# Patient Record
Sex: Male | Born: 1960 | Race: Black or African American | Hispanic: No | Marital: Single | Smoking: Current every day smoker
Health system: Southern US, Community
[De-identification: ages and names within clinical notes are randomized; demographics above are authoritative.]

## PROBLEM LIST (undated history)

## (undated) DIAGNOSIS — I509 Heart failure, unspecified: Secondary | ICD-10-CM

## (undated) DIAGNOSIS — I272 Pulmonary hypertension, unspecified: Secondary | ICD-10-CM

## (undated) DIAGNOSIS — J449 Chronic obstructive pulmonary disease, unspecified: Secondary | ICD-10-CM

## (undated) DIAGNOSIS — F319 Bipolar disorder, unspecified: Secondary | ICD-10-CM

## (undated) DIAGNOSIS — F1010 Alcohol abuse, uncomplicated: Secondary | ICD-10-CM

## (undated) DIAGNOSIS — I10 Essential (primary) hypertension: Secondary | ICD-10-CM

## (undated) DIAGNOSIS — I38 Endocarditis, valve unspecified: Secondary | ICD-10-CM

## (undated) DIAGNOSIS — M25559 Pain in unspecified hip: Secondary | ICD-10-CM

## (undated) HISTORY — DX: Pulmonary hypertension, unspecified: I27.20

## (undated) HISTORY — DX: Essential (primary) hypertension: I10

## (undated) HISTORY — DX: Heart failure, unspecified: I50.9

## (undated) HISTORY — PX: AORTIC VALVE REPLACEMENT: SHX41

## (undated) HISTORY — DX: Endocarditis, valve unspecified: I38

---

## 2005-10-11 ENCOUNTER — Emergency Department: Payer: Self-pay | Admitting: Emergency Medicine

## 2005-11-14 ENCOUNTER — Emergency Department: Payer: Self-pay | Admitting: Emergency Medicine

## 2006-08-10 ENCOUNTER — Emergency Department (HOSPITAL_COMMUNITY): Admission: EM | Admit: 2006-08-10 | Discharge: 2006-08-10 | Payer: Self-pay | Admitting: Emergency Medicine

## 2006-09-06 ENCOUNTER — Emergency Department: Payer: Self-pay | Admitting: Emergency Medicine

## 2007-06-14 ENCOUNTER — Emergency Department: Payer: Self-pay | Admitting: Emergency Medicine

## 2007-06-14 ENCOUNTER — Other Ambulatory Visit: Payer: Self-pay

## 2009-08-08 ENCOUNTER — Emergency Department: Payer: Self-pay | Admitting: Unknown Physician Specialty

## 2011-03-09 ENCOUNTER — Emergency Department: Payer: Self-pay | Admitting: Emergency Medicine

## 2011-08-07 ENCOUNTER — Observation Stay (HOSPITAL_COMMUNITY): Payer: Medicaid Other

## 2011-08-07 ENCOUNTER — Observation Stay (HOSPITAL_COMMUNITY)
Admission: EM | Admit: 2011-08-07 | Discharge: 2011-08-08 | Disposition: A | Discharge status: Rehabilitation Facility | Payer: Medicaid Other | Attending: Emergency Medicine | Admitting: Emergency Medicine

## 2011-08-07 ENCOUNTER — Emergency Department (HOSPITAL_COMMUNITY): Payer: Medicaid Other

## 2011-08-07 ENCOUNTER — Encounter (HOSPITAL_COMMUNITY): Payer: Self-pay | Admitting: *Deleted

## 2011-08-07 DIAGNOSIS — I38 Endocarditis, valve unspecified: Secondary | ICD-10-CM

## 2011-08-07 DIAGNOSIS — R079 Chest pain, unspecified: Principal | ICD-10-CM | POA: Insufficient documentation

## 2011-08-07 DIAGNOSIS — Z954 Presence of other heart-valve replacement: Secondary | ICD-10-CM | POA: Insufficient documentation

## 2011-08-07 DIAGNOSIS — I2510 Atherosclerotic heart disease of native coronary artery without angina pectoris: Secondary | ICD-10-CM | POA: Insufficient documentation

## 2011-08-07 DIAGNOSIS — F17200 Nicotine dependence, unspecified, uncomplicated: Secondary | ICD-10-CM | POA: Insufficient documentation

## 2011-08-07 DIAGNOSIS — J4489 Other specified chronic obstructive pulmonary disease: Secondary | ICD-10-CM | POA: Insufficient documentation

## 2011-08-07 DIAGNOSIS — F1410 Cocaine abuse, uncomplicated: Secondary | ICD-10-CM

## 2011-08-07 DIAGNOSIS — R0602 Shortness of breath: Secondary | ICD-10-CM | POA: Insufficient documentation

## 2011-08-07 DIAGNOSIS — J449 Chronic obstructive pulmonary disease, unspecified: Secondary | ICD-10-CM | POA: Insufficient documentation

## 2011-08-07 LAB — CARDIAC PANEL(CRET KIN+CKTOT+MB+TROPI)
CK, MB: 3.8 ng/mL (ref 0.3–4.0)
Relative Index: 0.7 (ref 0.0–2.5)
Troponin I: <0.3 ng/mL (ref <0.30)

## 2011-08-07 LAB — RAPID URINE DRUG SCREEN, HOSP PERFORMED: Opiates: NOT DETECTED

## 2011-08-07 LAB — PRO B NATRIURETIC PEPTIDE: Pro B Natriuretic peptide (BNP): 106.2 pg/mL (ref 0–125)

## 2011-08-07 LAB — CBC
MCH: 29.8 pg (ref 26.0–34.0)
MCHC: 34.5 g/dL (ref 30.0–36.0)
Platelets: 147 10*3/uL — ABNORMAL LOW (ref 150–400)
RBC: 5.43 MIL/uL (ref 4.22–5.81)
RDW: 14.2 % (ref 11.5–15.5)

## 2011-08-07 LAB — BASIC METABOLIC PANEL
CO2: 24 mEq/L (ref 19–32)
Calcium: 9.6 mg/dL (ref 8.4–10.5)
GFR calc Af Amer: >90 mL/min (ref >90)
GFR calc non Af Amer: >90 mL/min (ref >90)
Sodium: 133 mEq/L — ABNORMAL LOW (ref 135–145)

## 2011-08-07 LAB — POCT I-STAT TROPONIN I: Troponin i, poc: 0.01 ng/mL (ref 0.00–0.08)

## 2011-08-07 LAB — D-DIMER, QUANTITATIVE: D-Dimer, Quant: 2.79 ug/mL-FEU — ABNORMAL HIGH (ref 0.00–0.48)

## 2011-08-07 MED ORDER — SODIUM CHLORIDE 0.9 % IV SOLN
20.0000 mL | INTRAVENOUS | Status: DC
  Administered 2011-08-07: 20 mL via INTRAVENOUS

## 2011-08-07 MED ORDER — IOHEXOL 350 MG/ML SOLN
100.0000 mL | Freq: Once | INTRAVENOUS | Status: AC | PRN
  Administered 2011-08-07: 100 mL via INTRAVENOUS

## 2011-08-07 MED ORDER — LORAZEPAM 2 MG/ML IJ SOLN
1.0000 mg | Freq: Once | INTRAMUSCULAR | Status: AC
  Administered 2011-08-07: 1 mg via INTRAVENOUS
  Filled 2011-08-07: qty 1

## 2011-08-07 MED ORDER — NITROGLYCERIN 0.4 MG SL SUBL: 0.4000 mg | SUBLINGUAL_TABLET | SUBLINGUAL | Status: DC | PRN

## 2011-08-07 MED ORDER — ZOLPIDEM TARTRATE 5 MG PO TABS
5.0000 mg | ORAL_TABLET | Freq: Every evening | ORAL | Status: DC | PRN
  Administered 2011-08-07: 5 mg via ORAL
  Filled 2011-08-07: qty 1

## 2011-08-07 MED ORDER — ASPIRIN 325 MG PO TABS
325.0000 mg | ORAL_TABLET | ORAL | Status: AC
  Administered 2011-08-07: 325 mg via ORAL
  Filled 2011-08-07: qty 1

## 2011-08-07 NOTE — ED Notes (Signed)
Dinner tray ordered, HH diet. 

## 2011-08-07 NOTE — ED Notes (Signed)
Dinner tray delivered.

## 2011-08-07 NOTE — ED Notes (Signed)
Pt. Given [place] sandwich, applesauce and Sprite.

## 2011-08-07 NOTE — ED Provider Notes (Signed)
5:00 PM Assumed care of patient in the CDU from Dr. Manus Gunning.  Patient with a history of aortic valve replacement and COPD came in today with a chief complaint of left sided chest pain.  Patient did not have any acute changes on EKG.  Initial troponin negative.  Patient is currently on CP protocol.  Patient did have an elevated d-dimer.  Dr. Manus Gunning has also ordered a CTA to rule out a PE.  Patient is currently getting his CTA. 6:30 PM Reassessed patient.  Patient denies any chest pain at this time.  No acute distress.  VSS.  Alert and orientated x 3, Heart RRR, Lungs CTAB, no LE edema. 7:16 PM Discussed results of the CTA with patient.  Patient in no acute distress.  VSS.  3 hour troponin negative.  9:30 PM Reassessed patient.  Patient denies chest pain or SOB.  VSS.  No acute distress.  10:45 PM Reassessed patient.  Patient is sleeping comfortably.  VSS.  6 hour troponin negative.  Patient awaiting stress Echo in the AM.  Due to patient's recent CTA the patient can not get the CT coronary.  Pascal [place], PA-C 08/08/11 0021

## 2011-08-07 NOTE — ED Notes (Signed)
Records arrived from [facility] faxed eariler by Adora Fridge.

## 2011-08-07 NOTE — ED Provider Notes (Signed)
History     CSN: [id number]  Arrival date & time 08/07/11  1233   First MD Initiated Contact with Patient 08/07/11 1319      Chief Complaint  Patient presents with  . Chest Pain    (Consider location/radiation/quality/duration/timing/severity/associated sxs/prior treatment) HPI Comments: Patient presents with left-sided chest pain has been intermittent throughout the day today. It is sharp and stabbing and radiates into his left arm. The last 40 seconds to a few minutes at a time and resolves. He thinks is due to stress. Is associated with shortness of breath the patient has a history of COPD and states his breathing is no worse. No cough, fever, diaphoresis or nausea or vomiting. 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 He is not on Coumadin. She reports never having a heart attack and does not think he has stents. He states his last stress test or catheterization would be at [facility] several years ago.  The history is provided by the patient.    History reviewed. No pertinent past medical history.  Past Surgical History  Procedure Date  . Aortic valve replacement     No family history on file.  History  Substance Use Topics  . Smoking status: Current Everyday Smoker -- 1.0 packs/day    Types: Cigarettes  . Smokeless tobacco: Not on file  . Alcohol Use: No      Review of Systems  Constitutional: Negative for fever, activity change and appetite change.  HENT: Negative for congestion and rhinorrhea.   Respiratory: Positive for chest tightness and shortness of breath. Negative for cough.   Cardiovascular: Positive for chest pain.  Gastrointestinal: Negative for nausea, vomiting and abdominal pain.  Genitourinary: Negative for dysuria.  Musculoskeletal: Negative for back pain.  Skin: Negative for rash.  Neurological: Negative for headaches.    Allergies  Review of  patient's allergies indicates no known allergies.  Home Medications   Current Outpatient Rx  Name Route Sig Dispense Refill  . ASPIRIN 325 MG PO TABS Oral Take 325 mg by mouth daily.      BP 109/76  Pulse 84  Temp(Src) 98.5 F (36.9 C) (Oral)  Resp 21  SpO2 96%  Physical Exam  Constitutional: He is oriented to person, place, and time. He appears well-developed and well-nourished. No distress.  HENT:  Head: Normocephalic and atraumatic.  Mouth/Throat: Oropharynx is clear and moist. No oropharyngeal exudate.  Eyes: Conjunctivae are normal. Pupils are equal, round, and reactive to light.  Neck: Normal range of motion. Neck supple.  Cardiovascular: Normal rate, regular rhythm and intact distal pulses.   Murmur heard. Pulmonary/Chest: Effort normal and breath sounds normal. No respiratory distress. He exhibits no tenderness.       Chest pain not reproducible.  Abdominal: Soft. There is no tenderness. There is no rebound and no guarding.  Musculoskeletal: Normal range of motion. He exhibits no edema and no tenderness.  Neurological: He is alert and oriented to person, place, and time. No cranial nerve deficit.  Skin: Skin is warm.    ED Course  Procedures (including critical care time)  Labs Reviewed  CBC - Abnormal; Notable for the following:    Platelets 147 (*)    All other components within normal limits  BASIC METABOLIC PANEL - Abnormal; Notable for the following:    Sodium 133 (*)    Glucose, Bld 114 (*)    All other components  within normal limits  URINE RAPID DRUG SCREEN (HOSP PERFORMED) - Abnormal; Notable for the following:    Cocaine POSITIVE (*)    All other components within normal limits  CARDIAC PANEL(CRET KIN+CKTOT+MB+TROPI) - Abnormal; Notable for the following:    Total CK 566 (*)    All other components within normal limits  D-DIMER, QUANTITATIVE - Abnormal; Notable for the following:    D-Dimer, Quant 2.79 (*)    All other components within normal  limits  PRO B NATRIURETIC PEPTIDE  PROTIME-INR   Dg Chest 2 View  08/07/2011  *RADIOLOGY REPORT*  Clinical Data: Chest pain, status post CABG  CHEST - 2 VIEW  Comparison: 08/04/2009  Findings: Cardiomediastinal silhouette is stable.  Again noted status post median sternotomy.  No acute infiltrate or pleural effusion.  No pulmonary edema.  Mild hyperinflation.  Mild degenerative changes thoracic spine.  IMPRESSION: No active disease.  No significant change.  Original Report Authenticated By: Natasha Mead, M.D.     No diagnosis found.    MDM  Sharp stabbing intermittent left-sided chest pain radiating to left arm. Patient is a smoker with history of valve replacement secondary to endocarditis. Denies CAD history.  Pain is both typical and atypical features. If unable to obtain records or if stress test is remote will need at least CDU monitoring for repeat stress.  Only records obtained from [facility] was echocardiogram in 2010. No record of stress test or catheterization. Troponin negative, d-dimer elevated. We'll obtain CT angiogram of chest and placed in CDU for chest pain rule out.    Date: 08/07/2011  Rate: 112  Rhythm: sinus tachycardia  QRS Axis: right  Intervals: normal  ST/T Wave abnormalities: normal  Conduction Disutrbances:none  Narrative Interpretation:   Old EKG Reviewed: none available      Glynn Octave, MD 08/07/11 1758

## 2011-08-07 NOTE — ED Notes (Addendum)
Pt with hx of aortic valve replacement x2  is here due to CP which began this am.  Pt states that he has been under a lot of stress and feels that this CP is related to this stress.  Pain is in in left chest and radiates to arm.  Pt has some sob with this (has COPD), no nausea or diaphoresis with this.

## 2011-08-08 LAB — TROPONIN I: Troponin I: <0.3 ng/mL (ref <0.30)

## 2011-08-08 NOTE — ED Provider Notes (Signed)
  Physical Exam  BP 101/75  Pulse 75  Temp(Src) 98.4 F (36.9 C) (Oral)  Resp 15  SpO2 98%  Physical Exam  ED Course  Procedures  MDM Care assumed at change of shift, has no complaints overnight  Repeat EKG this morning shows no signs of ischemia, patient has normal vital signs, reexamination shows soft systolic murmur, normal pulse, normal lung sounds, patient sleeping but easily arousable, follows commands, expresses no complaints. Scheduled for stress echo this morning.  Change of shift, care signed out to oncoming physician      Vida Roller, MD 08/08/11 [phone]

## 2011-08-08 NOTE — ED Notes (Signed)
Patient transported to Echo Lab

## 2011-08-08 NOTE — ED Provider Notes (Signed)
BP 112/73  Pulse 86  Temp(Src) 97.6 F (36.4 C) (Oral)  Resp 20  SpO2 98%  Patient signed out to me by Dr. Eber Hong. The patient was seen previously by Dr. Manus Gunning. Evaluated the patient as well as reviewed his records from [facility] He has not complained of chest pain at this time or since arrival to the emergency department. He states that his last cocaine use was 4-5 days ago. His cocaine is positive here. I discussed with his case with cardiology Dr. Patty Sermons who stated that he is not a candidate for stress testing at this time secondary to his cocaine use with positive UDS. He recommended an additional 24-hour observation for possible arrhythmias as well as serial troponins and monitoring. The patient has had a total of 4 troponins in the emergency department and he is refusing admission. He has been signed out AMA by CDU PA.   Forbes Cellar, MD 08/08/11 [phone]

## 2011-08-08 NOTE — Discharge Instructions (Signed)
YOU HAVE DECLINED THE RECOMMENDED ADMISSION TO THE HOSPITAL TODAY. FOLLOW UP WITH YOUR DOCTOR IN [place] AS PLANNED. YOU CAN RETURN HERE ANYTIME YOU FEEL YOU WANT TO BE RE-EVALUATED FOR FURTHER TREATMENT.  Chest Pain (Nonspecific) Chest pain has many causes. Your pain could be caused by something serious, such as a heart attack or a blood clot in the lungs. It could also be caused by something less serious, such as a chest bruise or a virus. Follow up with your doctor. More lab tests or other studies may be needed to find the cause of your pain. Most of the time, nonspecific chest pain will improve within 2 to 3 days of rest and mild pain medicine. HOME CARE  For chest bruises, you may put ice on the sore area for 15 to 20 minutes, 3 to 4 times a day. Do this only if it makes you or your child feel better.   Put ice in a plastic bag.   Place a towel between the skin and the bag.   Rest for the next 2 to 3 days.   Go back to work if the pain improves.   See your doctor if the pain lasts longer than 1 to 2 weeks.   Only take medicine as told by your doctor.   Quit smoking if you smoke.  GET HELP RIGHT AWAY IF:   There is more pain or pain that spreads to the arm, neck, jaw, back, or belly (abdomen).   You or your child has shortness of breath.   You or your child coughs more than usual or coughs up blood.   You or your child has very bad back or belly pain, feels sick to his or her stomach (nauseous), or throws up (vomits).   You or your child has very bad weakness.   You or your child passes out (faints).   You or your child has a temperature by mouth above 102 F (38.9 C), not controlled by medicine.  Any of these problems may be serious and may be an emergency. Do not wait to see if the problems will go away. Get medical help right away. Call your local emergency services 911 in [place] Do not drive yourself to the hospital. MAKE SURE YOU:   Understand these instructions.     Will watch this condition.   Will get help right away if you or your child is not doing well or gets worse.  Document Released: 09/11/2007 Document Revised: 03/14/2011 Document Reviewed: 09/11/2007 [facility] Patient Information 2012 [place], [place].  Cocaine Abuse PROBLEMS FROM USING COCAINE:   Highly addictive.   Illegal.   Risk of sudden death.   Heart disease.   Irregular heart beat.   High blood pressure.   Damage to nose and lungs.   Severe agitation.   Hallucinations.   Violent behavior.   Paranoia.   Sexual dysfunction.  Most cocaine users deny that they have a problem with addiction. The biggest problem is admitting that you are dependent on cocaine. Those trying to quit using it may experience depression and withdrawal symptoms. Other withdrawal symptoms include fatigue, suicidal thoughts, sleepiness, restlessness, anxiety, and increased craving for cocaine. There are medications available to help prevent depression associated with stopping cocaine. Most users will find a support group or treatment program helpful in coming off and staying off cocaine. The best chance to cure cocaine addiction is to go into group therapy and to be in a drug-free environment. It is very  important to develop healthy relationships and avoid socializing with people who use or deal drugs. Eat well, and give your body the proper rest and healthy exercise it needs. You may need medication to help treat withdrawal symptoms. Call your caregiver or a drug treatment center for more help.  You may also want to call the [facility] on Drug [facility] at 800-662-HELP in the [place] SEEK IMMEDIATE MEDICAL CARE IF:  You develop severe chest pain.   You develop shortness of breath.   You develop extreme agitation.  Document Released: 05/02/2004 Document Revised: 03/14/2011 Document Reviewed: 01/25/2009 [facility] [place] Patient Information 2012 [place], [place].

## 2011-08-08 NOTE — ED Provider Notes (Signed)
Mr. Burright has remained pain free for duration of CDU stay. Series of 4 troponin tests negative. Because of his cocaine use and previous heart disease, he is not a candidate for stress echo today. Dr. Hyman Hopes (ED) spoke with Dr. Patty Sermons, on-call cardiology, who recommends admission for observation. The patient declines admission and wishes to follow up with his doctor in [place]. Risks of worsening condition, possibly resulting in death, were discussed and patient understands. He still would like to go home. Patient discharged AMA and encouraged to return anytime.  Rodena Medin, PA-C 08/08/11 1155

## 2011-08-08 NOTE — ED Notes (Signed)
First meeting with patient. Patient remiains on monitor and sats of 97% RA.  Patient resting with NAD at this time.

## 2011-08-08 NOTE — ED Notes (Signed)
Patient remains on monitor and sats 97% and resting with NAD at this time.

## 2011-08-08 NOTE — ED Notes (Signed)
Patient returned from Echo lab. Patient remains on monitor and sats 99%.

## 2011-08-08 NOTE — ED Notes (Signed)
Pt. Resting on stretcher, talking on phone. Pt. Given Happy Meal and Sprite per request. No other needs voiced at this time.

## 2011-08-08 NOTE — ED Provider Notes (Signed)
Medical screening examination/treatment/procedure(s) were conducted as a shared visit with non-physician practitioner(s) and myself.  I personally evaluated the patient during the encounter   Glynn Octave, MD 08/08/11 1103

## 2011-09-11 ENCOUNTER — Emergency Department: Payer: Self-pay | Admitting: Emergency Medicine

## 2011-09-11 LAB — CBC
HGB: 15.7 g/dL (ref 13.0–18.0)
MCH: 29.5 pg (ref 26.0–34.0)
MCV: 90 fL (ref 80–100)
Platelet: 124 10*3/uL — ABNORMAL LOW (ref 150–440)
RDW: 13.7 % (ref 11.5–14.5)
WBC: 6.3 10*3/uL (ref 3.8–10.6)

## 2011-09-12 LAB — DRUG SCREEN, URINE
MDMA (Ecstasy)Ur Screen: NEGATIVE (ref ?–500)
Opiate, Ur Screen: NEGATIVE (ref ?–300)
Phencyclidine (PCP) Ur S: NEGATIVE (ref ?–25)
Tricyclic, Ur Screen: NEGATIVE (ref ?–1000)

## 2011-09-12 LAB — BASIC METABOLIC PANEL
Creatinine: 0.9 mg/dL (ref 0.60–1.30)
EGFR (African American): >60
EGFR (Non-African Amer.): >60
Glucose: 88 mg/dL (ref 65–99)
Potassium: 3.6 mmol/L (ref 3.5–5.1)

## 2011-09-12 LAB — TROPONIN I
Troponin-I: <0.02 ng/mL
Troponin-I: 0.03 ng/mL

## 2011-09-12 LAB — CK TOTAL AND CKMB (NOT AT ARMC)
CK, Total: 913 U/L — ABNORMAL HIGH (ref 35–232)
CK-MB: 5.4 ng/mL — ABNORMAL HIGH (ref 0.5–3.6)

## 2011-09-12 LAB — RAPID HIV-1/2 QL/CONFIRM: HIV-1/2,Rapid Ql: NEGATIVE

## 2012-06-05 ENCOUNTER — Emergency Department: Payer: Self-pay | Admitting: Emergency Medicine

## 2012-06-05 LAB — URINALYSIS, COMPLETE
Bacteria: NONE SEEN
Glucose,UR: NEGATIVE mg/dL (ref 0–75)
Ph: 6 (ref 4.5–8.0)
Protein: NEGATIVE
Specific Gravity: 1.003 (ref 1.003–1.030)

## 2012-08-08 ENCOUNTER — Emergency Department: Payer: Self-pay | Admitting: Emergency Medicine

## 2013-01-28 ENCOUNTER — Emergency Department: Payer: Self-pay | Admitting: Emergency Medicine

## 2013-11-27 ENCOUNTER — Emergency Department: Payer: Self-pay | Admitting: Emergency Medicine

## 2013-11-27 LAB — BASIC METABOLIC PANEL
Anion Gap: 9 (ref 7–16)
BUN: 9 mg/dL (ref 7–18)
CHLORIDE: 108 mmol/L — AB (ref 98–107)
CREATININE: 0.91 mg/dL (ref 0.60–1.30)
Calcium, Total: 8.4 mg/dL — ABNORMAL LOW (ref 8.5–10.1)
Co2: 22 mmol/L (ref 21–32)
GLUCOSE: 120 mg/dL — AB (ref 65–99)
Osmolality: 277 (ref 275–301)
POTASSIUM: 4.3 mmol/L (ref 3.5–5.1)
SODIUM: 139 mmol/L (ref 136–145)

## 2013-11-27 LAB — CBC
HCT: 47.7 % (ref 40.0–52.0)
HGB: 15.4 g/dL (ref 13.0–18.0)
MCH: 29.2 pg (ref 26.0–34.0)
MCHC: 32.3 g/dL (ref 32.0–36.0)
MCV: 91 fL (ref 80–100)
PLATELETS: 198 10*3/uL (ref 150–440)
RBC: 5.26 10*6/uL (ref 4.40–5.90)
RDW: 14.4 % (ref 11.5–14.5)
WBC: 8.3 10*3/uL (ref 3.8–10.6)

## 2013-11-27 LAB — TROPONIN I: Troponin-I: <0.02 ng/mL

## 2013-12-19 ENCOUNTER — Observation Stay: Payer: Self-pay | Admitting: Internal Medicine

## 2013-12-19 LAB — TROPONIN I
Troponin-I: <0.02 ng/mL
Troponin-I: 0.04 ng/mL
Troponin-I: 0.05 ng/mL

## 2013-12-19 LAB — BASIC METABOLIC PANEL
ANION GAP: 6 — AB (ref 7–16)
BUN: 12 mg/dL (ref 7–18)
CHLORIDE: 112 mmol/L — AB (ref 98–107)
Calcium, Total: 7.8 mg/dL — ABNORMAL LOW (ref 8.5–10.1)
Co2: 24 mmol/L (ref 21–32)
Creatinine: 1.16 mg/dL (ref 0.60–1.30)
EGFR (African American): >60
EGFR (Non-African Amer.): >60
GLUCOSE: 95 mg/dL (ref 65–99)
Osmolality: 283 (ref 275–301)
Potassium: 3.6 mmol/L (ref 3.5–5.1)
SODIUM: 142 mmol/L (ref 136–145)

## 2013-12-19 LAB — APTT: Activated PTT: 28.3 secs (ref 23.6–35.9)

## 2013-12-19 LAB — URINALYSIS, COMPLETE
BACTERIA: NONE SEEN
Bilirubin,UR: NEGATIVE
Blood: NEGATIVE
Glucose,UR: NEGATIVE mg/dL (ref 0–75)
Hyaline Cast: 12
Ketone: NEGATIVE
Leukocyte Esterase: NEGATIVE
Nitrite: NEGATIVE
Ph: 5 (ref 4.5–8.0)
Protein: NEGATIVE
SPECIFIC GRAVITY: 1.006 (ref 1.003–1.030)
Squamous Epithelial: NONE SEEN
WBC UR: <1 /HPF (ref 0–5)

## 2013-12-19 LAB — DRUG SCREEN, URINE

## 2013-12-19 LAB — COMPREHENSIVE METABOLIC PANEL
ALBUMIN: 3.2 g/dL — AB (ref 3.4–5.0)
Alkaline Phosphatase: 178 U/L — ABNORMAL HIGH
Anion Gap: 11 (ref 7–16)
BUN: 15 mg/dL (ref 7–18)
Bilirubin,Total: 0.3 mg/dL (ref 0.2–1.0)
Calcium, Total: 8.5 mg/dL (ref 8.5–10.1)
Chloride: 105 mmol/L (ref 98–107)
Co2: 24 mmol/L (ref 21–32)
Creatinine: 1.4 mg/dL — ABNORMAL HIGH (ref 0.60–1.30)
GFR CALC NON AF AMER: 57 — AB
GLUCOSE: 102 mg/dL — AB (ref 65–99)
Osmolality: 280 (ref 275–301)
Potassium: 3.6 mmol/L (ref 3.5–5.1)
SGOT(AST): 137 U/L — ABNORMAL HIGH (ref 15–37)
SGPT (ALT): 79 U/L — ABNORMAL HIGH
SODIUM: 140 mmol/L (ref 136–145)
TOTAL PROTEIN: 7.6 g/dL (ref 6.4–8.2)

## 2013-12-19 LAB — CK TOTAL AND CKMB (NOT AT ARMC)
CK, Total: 414 U/L — ABNORMAL HIGH
CK, Total: 642 U/L — ABNORMAL HIGH
CK, Total: 787 U/L — ABNORMAL HIGH
CK-MB: 3.2 ng/mL (ref 0.5–3.6)
CK-MB: 5 ng/mL — ABNORMAL HIGH (ref 0.5–3.6)
CK-MB: 5.9 ng/mL — AB (ref 0.5–3.6)

## 2013-12-19 LAB — CBC
HCT: 40.2 % (ref 40.0–52.0)
HGB: 12.9 g/dL — AB (ref 13.0–18.0)
MCH: 29.2 pg (ref 26.0–34.0)
MCHC: 32.1 g/dL (ref 32.0–36.0)
MCV: 91 fL (ref 80–100)
Platelet: 105 10*3/uL — ABNORMAL LOW (ref 150–440)
RBC: 4.43 10*6/uL (ref 4.40–5.90)
RDW: 14.1 % (ref 11.5–14.5)
WBC: 8.1 10*3/uL (ref 3.8–10.6)

## 2013-12-19 LAB — MAGNESIUM: Magnesium: 1.7 mg/dL — ABNORMAL LOW

## 2013-12-19 LAB — ETHANOL: ETHANOL LVL: 246 mg/dL

## 2013-12-19 LAB — LIPASE, BLOOD: Lipase: 249 U/L (ref 73–393)

## 2013-12-19 LAB — PROTIME-INR
INR: 1
PROTHROMBIN TIME: 13 s (ref 11.5–14.7)

## 2013-12-24 ENCOUNTER — Emergency Department: Payer: Self-pay | Admitting: Emergency Medicine

## 2013-12-24 LAB — ACETAMINOPHEN LEVEL: Acetaminophen: <2 ug/mL

## 2013-12-24 LAB — COMPREHENSIVE METABOLIC PANEL
ALBUMIN: 3.3 g/dL — AB (ref 3.4–5.0)
ALK PHOS: 207 U/L — AB
ALT: 67 U/L — AB
Anion Gap: 12 (ref 7–16)
BILIRUBIN TOTAL: 0.3 mg/dL (ref 0.2–1.0)
BUN: 10 mg/dL (ref 7–18)
CHLORIDE: 106 mmol/L (ref 98–107)
Calcium, Total: 8.3 mg/dL — ABNORMAL LOW (ref 8.5–10.1)
Co2: 20 mmol/L — ABNORMAL LOW (ref 21–32)
Creatinine: 1.02 mg/dL (ref 0.60–1.30)
EGFR (African American): >60
EGFR (Non-African Amer.): >60
Glucose: 84 mg/dL (ref 65–99)
Osmolality: 274 (ref 275–301)
Potassium: 3.8 mmol/L (ref 3.5–5.1)
SGOT(AST): 106 U/L — ABNORMAL HIGH (ref 15–37)
SODIUM: 138 mmol/L (ref 136–145)
TOTAL PROTEIN: 8 g/dL (ref 6.4–8.2)

## 2013-12-24 LAB — CBC
HCT: 41.2 % (ref 40.0–52.0)
HGB: 13.4 g/dL (ref 13.0–18.0)
MCH: 29.3 pg (ref 26.0–34.0)
MCHC: 32.5 g/dL (ref 32.0–36.0)
MCV: 90 fL (ref 80–100)
Platelet: 193 10*3/uL (ref 150–440)
RBC: 4.57 10*6/uL (ref 4.40–5.90)
RDW: 14.4 % (ref 11.5–14.5)
WBC: 7.3 10*3/uL (ref 3.8–10.6)

## 2013-12-24 LAB — SALICYLATE LEVEL: Salicylates, Serum: 2.4 mg/dL

## 2013-12-25 LAB — TROPONIN I

## 2013-12-25 LAB — URINALYSIS, COMPLETE
BILIRUBIN, UR: NEGATIVE
BLOOD: NEGATIVE
Bacteria: NONE SEEN
Glucose,UR: NEGATIVE mg/dL (ref 0–75)
KETONE: NEGATIVE
LEUKOCYTE ESTERASE: NEGATIVE
Nitrite: NEGATIVE
Ph: 6 (ref 4.5–8.0)
Protein: NEGATIVE
RBC, UR: NONE SEEN /HPF (ref 0–5)
SPECIFIC GRAVITY: 1.002 (ref 1.003–1.030)
Squamous Epithelial: NONE SEEN
WBC UR: <1 /HPF (ref 0–5)

## 2013-12-25 LAB — DRUG SCREEN, URINE
AMPHETAMINES, UR SCREEN: NEGATIVE (ref ?–1000)
Barbiturates, Ur Screen: NEGATIVE (ref ?–200)
Benzodiazepine, Ur Scrn: NEGATIVE (ref ?–200)
COCAINE METABOLITE, UR ~~LOC~~: NEGATIVE (ref ?–300)
Cannabinoid 50 Ng, Ur ~~LOC~~: NEGATIVE (ref ?–50)
MDMA (ECSTASY) UR SCREEN: NEGATIVE (ref ?–500)
Methadone, Ur Screen: NEGATIVE (ref ?–300)
Opiate, Ur Screen: NEGATIVE (ref ?–300)
PHENCYCLIDINE (PCP) UR S: NEGATIVE (ref ?–25)
Tricyclic, Ur Screen: NEGATIVE (ref ?–1000)

## 2013-12-25 LAB — ETHANOL: Ethanol: 341 mg/dL

## 2014-01-01 ENCOUNTER — Emergency Department: Payer: Self-pay | Admitting: Student

## 2014-01-01 LAB — CBC
HCT: 42.7 % (ref 40.0–52.0)
HGB: 13.6 g/dL (ref 13.0–18.0)
MCH: 28.8 pg (ref 26.0–34.0)
MCHC: 31.9 g/dL — ABNORMAL LOW (ref 32.0–36.0)
MCV: 90 fL (ref 80–100)
Platelet: 137 10*3/uL — ABNORMAL LOW (ref 150–440)
RBC: 4.72 10*6/uL (ref 4.40–5.90)
RDW: 14.3 % (ref 11.5–14.5)
WBC: 8.9 10*3/uL (ref 3.8–10.6)

## 2014-01-01 LAB — ETHANOL: Ethanol: 335 mg/dL

## 2014-01-01 LAB — COMPREHENSIVE METABOLIC PANEL
ALBUMIN: 3.6 g/dL (ref 3.4–5.0)
Alkaline Phosphatase: 146 U/L — ABNORMAL HIGH
Anion Gap: 9 (ref 7–16)
BILIRUBIN TOTAL: 0.4 mg/dL (ref 0.2–1.0)
BUN: 4 mg/dL — AB (ref 7–18)
CALCIUM: 8.6 mg/dL (ref 8.5–10.1)
CHLORIDE: 104 mmol/L (ref 98–107)
CREATININE: 0.87 mg/dL (ref 0.60–1.30)
Co2: 23 mmol/L (ref 21–32)
EGFR (African American): >60
EGFR (Non-African Amer.): >60
GLUCOSE: 97 mg/dL (ref 65–99)
Osmolality: 269 (ref 275–301)
Potassium: 3.8 mmol/L (ref 3.5–5.1)
SGOT(AST): 103 U/L — ABNORMAL HIGH (ref 15–37)
SGPT (ALT): 70 U/L — ABNORMAL HIGH
SODIUM: 136 mmol/L (ref 136–145)
Total Protein: 8.3 g/dL — ABNORMAL HIGH (ref 6.4–8.2)

## 2014-01-12 ENCOUNTER — Ambulatory Visit: Payer: Self-pay | Admitting: Otolaryngology
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
## 2015-04-06 IMAGING — CR DG CHEST 1V PORT
1 series · 1 of 1 positions shown · non-contrast
Comparison: Prior radiograph from 11/27/2013

CLINICAL DATA: Atypical chest pain

EXAM:
PORTABLE CHEST - 1 VIEW

[ap]
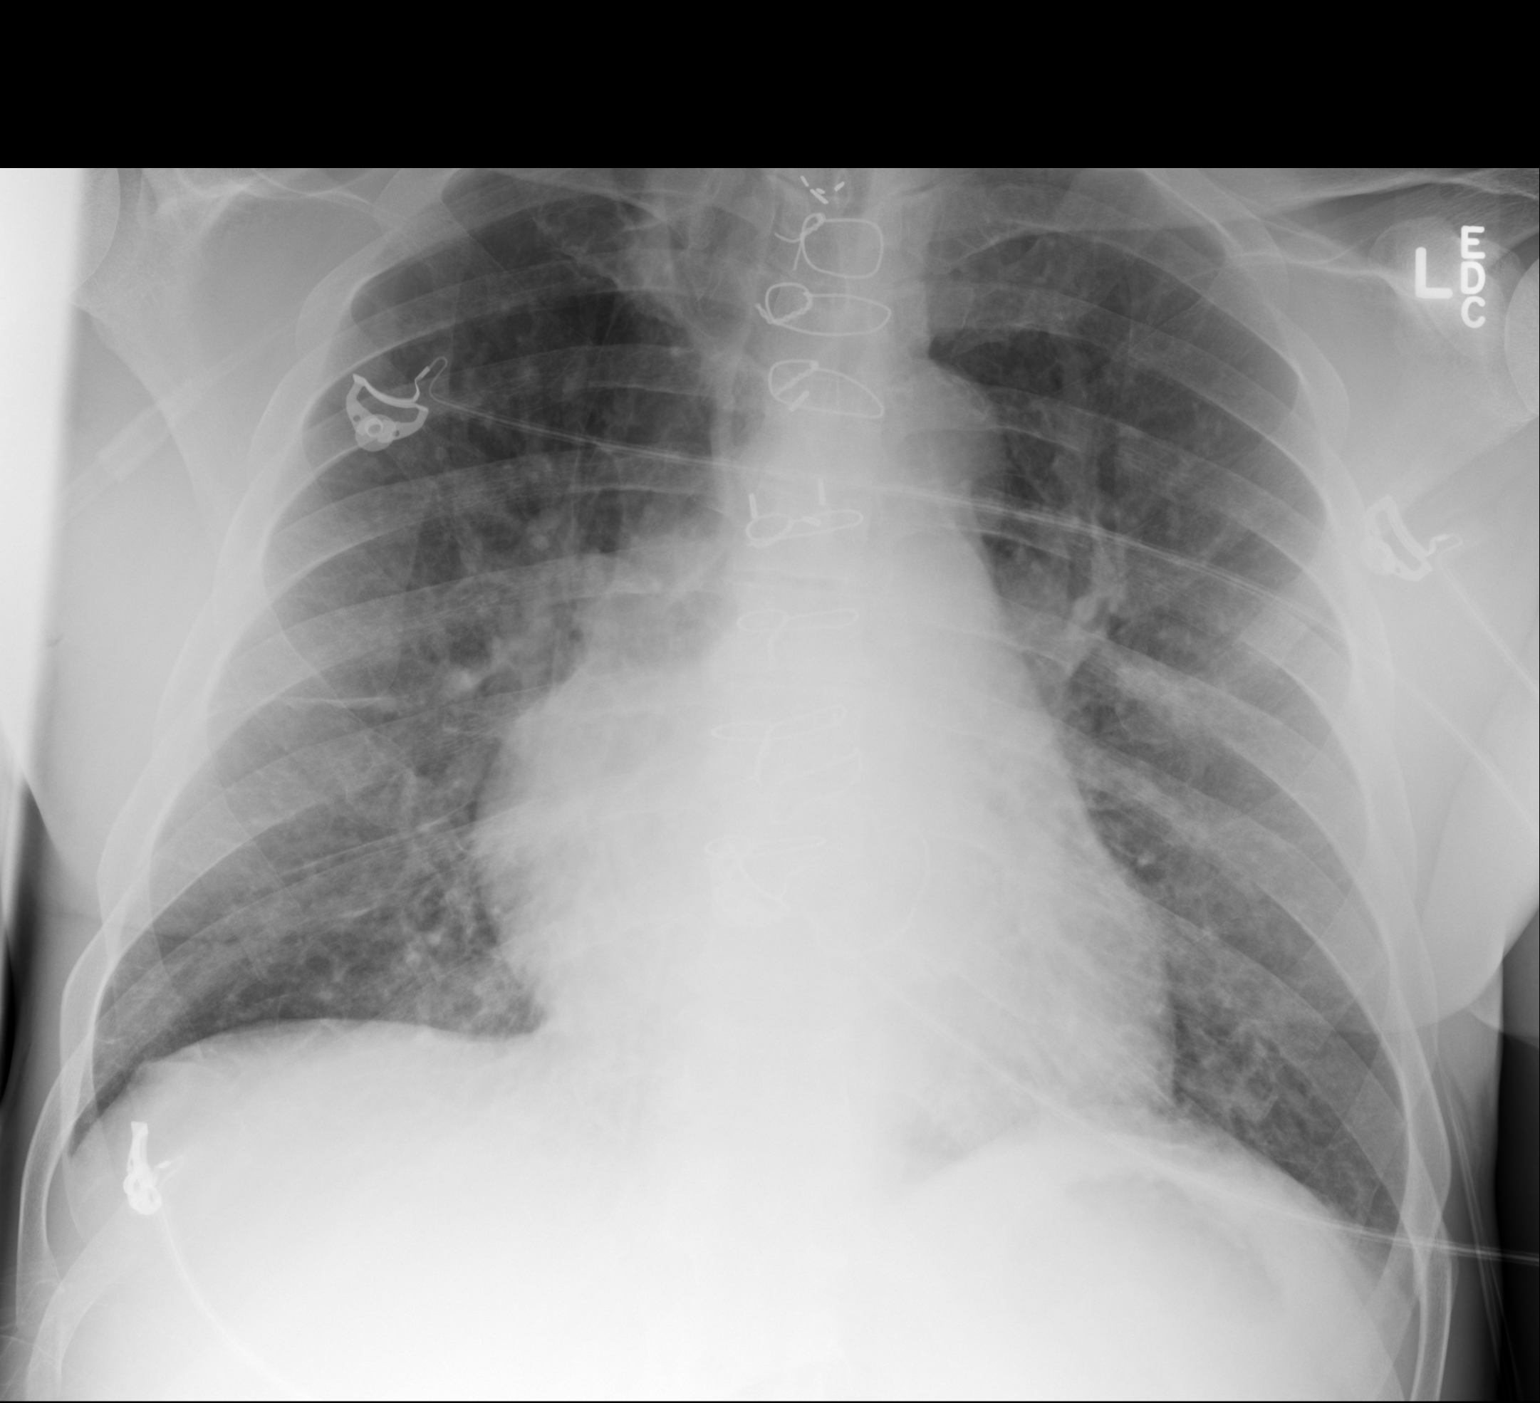

[1 of 1 positions shown; findings below may reference images not displayed]

FINDINGS: Median sternotomy wires with underlying CABG markers and surgical
clips again seen, stable. Cardiomegaly is unchanged. Mediastinal
silhouette within normal limits.

Lungs are normally inflated. There is mild diffuse pulmonary
vascular congestion without overt pulmonary edema. No focal
infiltrates. No pneumothorax. No pleural effusion.

No acute osseous abnormality.
IMPRESSION: Stable cardiomegaly with mild diffuse pulmonary vascular congestion
without overt pulmonary edema. No other acute cardiopulmonary
abnormality.
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
## 2017-06-02 ENCOUNTER — Emergency Department (HOSPITAL_COMMUNITY)
Admission: EM | Admit: 2017-06-02 | Discharge: 2017-06-02 | Disposition: A | Discharge status: Home / Self Care | Payer: Medicaid Other | Attending: Emergency Medicine | Admitting: Emergency Medicine

## 2017-06-02 ENCOUNTER — Encounter (HOSPITAL_COMMUNITY): Payer: Self-pay | Admitting: Emergency Medicine

## 2017-06-02 ENCOUNTER — Other Ambulatory Visit: Payer: Self-pay

## 2017-06-02 DIAGNOSIS — Z7982 Long term (current) use of aspirin: Secondary | ICD-10-CM | POA: Insufficient documentation

## 2017-06-02 DIAGNOSIS — M25559 Pain in unspecified hip: Secondary | ICD-10-CM | POA: Insufficient documentation

## 2017-06-02 DIAGNOSIS — G8929 Other chronic pain: Secondary | ICD-10-CM | POA: Diagnosis not present

## 2017-06-02 DIAGNOSIS — J449 Chronic obstructive pulmonary disease, unspecified: Secondary | ICD-10-CM | POA: Insufficient documentation

## 2017-06-02 DIAGNOSIS — F17210 Nicotine dependence, cigarettes, uncomplicated: Secondary | ICD-10-CM | POA: Diagnosis not present

## 2017-06-02 DIAGNOSIS — M549 Dorsalgia, unspecified: Secondary | ICD-10-CM | POA: Diagnosis present

## 2017-06-02 HISTORY — DX: Pain in unspecified hip: M25.559

## 2017-06-02 HISTORY — DX: Bipolar disorder, unspecified: F31.9

## 2017-06-02 HISTORY — DX: Alcohol abuse, uncomplicated: F10.10

## 2017-06-02 HISTORY — DX: Chronic obstructive pulmonary disease, unspecified: J44.9

## 2017-06-02 LAB — CBG MONITORING, ED: GLUCOSE-CAPILLARY: 68 mg/dL (ref 65–99)

## 2017-06-02 MED ORDER — IBUPROFEN 800 MG PO TABS
800.0000 mg | ORAL_TABLET | Freq: Once | ORAL | Status: AC
  Administered 2017-06-02: 800 mg via ORAL
  Filled 2017-06-02: qty 1

## 2017-06-02 NOTE — ED Notes (Signed)
Pt at nurse first desk requesting MD to order Lidoderm patch for now.  Informed pt he is getting ready to go back soon.

## 2017-06-02 NOTE — ED Notes (Signed)
Pt understood dc material. NAD noted. Sandwich given prior to DC

## 2017-06-02 NOTE — ED Triage Notes (Addendum)
Pt arrived with daughter that is also a patient.  Came to registration and states he only wants to check in if his name is [facility].  States he is here to get his medical records.  Informed pt that he would need to go to medical records after 8am to get records and pt verbally aggressive.  States he is here for a check-up and needs a Rx for pain meds.  [facility] has [facility].    Asked if he got the Lidoderm patches the Dr. Lenna Gilfordrdered and he states he is unaware of them and will go to pharmacy in the morning to see if they are there.  Pt asked EMT to check blood sugar and says his meter is broke and he hasn't checked it in a while.  Diabetes is not listed in previous history.

## 2017-06-02 NOTE — ED Notes (Signed)
Checked CBG 68, RN Karen informed

## 2017-06-02 NOTE — ED Notes (Signed)
Pt requesting juice due to cbg being 68. Ok per [employer] Karen, pt requested cranberry juice and pt given the same.

## 2017-06-02 NOTE — ED Provider Notes (Signed)
[facility]ENC[facility] Provider Note   CSN: [id number] Arrival date & time: 06/02/17  0257     History   Chief Complaint Chief Complaint  Patient presents with  . check-up    HPI Mike MilletMichael L Magoon is a 57 y.o. male.  Patient presents to the emergency department with a chief complaint of chronic back and hip pain.  He states that he is here with his daughter, and thought that he would check in to see if we could help with his pain.  [facility]  He states that he was recently prescribed a Lidoderm patch, but has not been able to pick it up yet.  He also requests a referral to an orthopedic for second opinion.  He also states that he feels like his blood sugar is running low.   The history is provided by the patient. No language interpreter was used.    Past Medical History:  Diagnosis Date  . Alcohol abuse   . Bipolar 1 disorder (HCC)   . COPD (chronic obstructive pulmonary disease) (HCC)   . Hip pain     There are no active problems to display for this patient.   Past Surgical History:  Procedure Laterality Date  . AORTIC VALVE REPLACEMENT         Home Medications    Prior to Admission medications   Medication Sig Start Date End Date Taking? Authorizing Provider  aspirin 325 MG tablet Take 325 mg by mouth daily.    [provider]    Family History No family history on file.  Social History Social History   Tobacco Use  . Smoking status: Current Every Day Smoker    Packs/day: 1.00    Types: Cigarettes  . Smokeless tobacco: Never Used  Substance Use Topics  . Alcohol use: Yes  . Drug use: Yes    Types: Cocaine     Allergies   Patient has no known allergies.   Review of Systems Review of Systems  All other systems reviewed and are negative.    Physical Exam Updated Vital Signs BP 119/77 (BP Location: Right Arm)   Pulse (!) 101   Temp 98.4 F (36.9 C) (Oral)   Resp 16   SpO2 97%    Physical Exam  Constitutional: He is oriented to person, place, and time. No distress.  HENT:  Head: Normocephalic and atraumatic.  Eyes: Conjunctivae and EOM are normal. Pupils are equal, round, and reactive to light.  Neck: No tracheal deviation present.  Cardiovascular: Normal rate.  Pulmonary/Chest: Effort normal. No respiratory distress.  Abdominal: Soft.  Musculoskeletal: Normal range of motion.  Neurological: He is alert and oriented to person, place, and time.  Skin: Skin is warm and dry. He is not diaphoretic.  Psychiatric: Judgment normal.  Nursing note and vitals reviewed.    ED Treatments / Results  Labs (all labs ordered are listed, but only abnormal results are displayed) Labs Reviewed  CBG MONITORING, ED    EKG  EKG Interpretation None       Radiology No results found.  Procedures Procedures (including critical care time)  Medications Ordered in ED Medications  ibuprofen (ADVIL,MOTRIN) tablet 800 mg (not administered)     Initial Impression / Assessment and Plan / ED Course  I have reviewed the triage vital signs and the nursing notes.  Pertinent labs & imaging results that were available during my care of the patient were reviewed by me and considered  in my medical decision making (see chart for details).     CBG checked in triage was 68.  Patient was given cranberry juice.  I have given the patient a sandwich in the ED.  Discussed that I cannot manage his chronic pain, but will give patient referral to orthopedics in [place].  The patient will be discharged home with Ortho follow-up.  Final Clinical Impressions(s) / ED Diagnoses   Final diagnoses:  Other chronic pain    ED Discharge Orders    None       Roxy Horseman, PA-C 06/02/17 0546    Ward, Layla Maw, DO 06/02/17 [phone]

## 2018-12-28 ENCOUNTER — Encounter: Payer: Self-pay | Admitting: Emergency Medicine

## 2018-12-28 ENCOUNTER — Other Ambulatory Visit: Payer: Self-pay

## 2018-12-28 ENCOUNTER — Emergency Department
Admission: EM | Admit: 2018-12-28 | Discharge: 2018-12-28 | Disposition: A | Discharge status: Home / Self Care | Payer: Medicaid Other | Attending: Student in an Organized Health Care Education/Training Program | Admitting: Student in an Organized Health Care Education/Training Program

## 2018-12-28 ENCOUNTER — Emergency Department: Payer: Medicaid Other

## 2018-12-28 DIAGNOSIS — J449 Chronic obstructive pulmonary disease, unspecified: Secondary | ICD-10-CM | POA: Diagnosis not present

## 2018-12-28 DIAGNOSIS — F17210 Nicotine dependence, cigarettes, uncomplicated: Secondary | ICD-10-CM | POA: Diagnosis not present

## 2018-12-28 DIAGNOSIS — Z7982 Long term (current) use of aspirin: Secondary | ICD-10-CM | POA: Insufficient documentation

## 2018-12-28 DIAGNOSIS — J40 Bronchitis, not specified as acute or chronic: Secondary | ICD-10-CM

## 2018-12-28 DIAGNOSIS — R0602 Shortness of breath: Secondary | ICD-10-CM

## 2018-12-28 DIAGNOSIS — Z20828 Contact with and (suspected) exposure to other viral communicable diseases: Secondary | ICD-10-CM | POA: Insufficient documentation

## 2018-12-28 LAB — COMPREHENSIVE METABOLIC PANEL
ALT: 32 U/L (ref 0–44)
AST: 49 U/L — ABNORMAL HIGH (ref 15–41)
Albumin: 3.7 g/dL (ref 3.5–5.0)
Alkaline Phosphatase: 132 U/L — ABNORMAL HIGH (ref 38–126)
Anion gap: 6 (ref 5–15)
BUN: 11 mg/dL (ref 6–20)
CO2: 25 mmol/L (ref 22–32)
Calcium: 8.9 mg/dL (ref 8.9–10.3)
Chloride: 103 mmol/L (ref 98–111)
Creatinine, Ser: 1.07 mg/dL (ref 0.61–1.24)
GFR calc Af Amer: >60 mL/min (ref >60)
GFR calc non Af Amer: >60 mL/min (ref >60)
Glucose, Bld: 122 mg/dL — ABNORMAL HIGH (ref 70–99)
Potassium: 5.1 mmol/L (ref 3.5–5.1)
Sodium: 134 mmol/L — ABNORMAL LOW (ref 135–145)
Total Bilirubin: 1.4 mg/dL — ABNORMAL HIGH (ref 0.3–1.2)
Total Protein: 7.2 g/dL (ref 6.5–8.1)

## 2018-12-28 LAB — CBC WITH DIFFERENTIAL/PLATELET
Abs Immature Granulocytes: 0.04 10*3/uL (ref 0.00–0.07)
Basophils Absolute: 0 10*3/uL (ref 0.0–0.1)
Basophils Relative: 0 %
Eosinophils Absolute: 0.1 10*3/uL (ref 0.0–0.5)
Eosinophils Relative: 2 %
HCT: 40.8 % (ref 39.0–52.0)
Hemoglobin: 13.4 g/dL (ref 13.0–17.0)
Immature Granulocytes: 1 %
Lymphocytes Relative: 17 %
Lymphs Abs: 1.1 10*3/uL (ref 0.7–4.0)
MCH: 28.4 pg (ref 26.0–34.0)
MCHC: 32.8 g/dL (ref 30.0–36.0)
MCV: 86.4 fL (ref 80.0–100.0)
Monocytes Absolute: 0.6 10*3/uL (ref 0.1–1.0)
Monocytes Relative: 9 %
Neutro Abs: 4.8 10*3/uL (ref 1.7–7.7)
Neutrophils Relative %: 71 %
Platelets: 195 10*3/uL (ref 150–400)
RBC: 4.72 MIL/uL (ref 4.22–5.81)
RDW: 13.8 % (ref 11.5–15.5)
WBC: 6.7 10*3/uL (ref 4.0–10.5)
nRBC: 0 % (ref 0.0–0.2)

## 2018-12-28 LAB — SARS CORONAVIRUS 2 (TAT 6-24 HRS): SARS Coronavirus 2: NEGATIVE

## 2018-12-28 MED ORDER — AZITHROMYCIN 500 MG PO TABS: 500.0000 mg | ORAL_TABLET | Freq: Every day | ORAL | 0 refills | Status: AC

## 2018-12-28 MED ORDER — IPRATROPIUM-ALBUTEROL 0.5-2.5 (3) MG/3ML IN SOLN
3.0000 mL | Freq: Once | RESPIRATORY_TRACT | Status: AC
Start: 2018-12-28 — End: 2018-12-28
  Administered 2018-12-28: 3 mL via RESPIRATORY_TRACT
  Filled 2018-12-28: qty 3

## 2018-12-28 MED ORDER — IPRATROPIUM-ALBUTEROL 0.5-2.5 (3) MG/3ML IN SOLN
3.0000 mL | Freq: Once | RESPIRATORY_TRACT | Status: AC
  Administered 2018-12-28: 3 mL via RESPIRATORY_TRACT
  Filled 2018-12-28: qty 3

## 2018-12-28 MED ORDER — CEPHALEXIN 500 MG PO CAPS: 500.0000 mg | ORAL_CAPSULE | Freq: Three times a day (TID) | ORAL | 0 refills | Status: AC

## 2018-12-28 MED ORDER — PREDNISONE 20 MG PO TABS: 40.0000 mg | ORAL_TABLET | Freq: Every day | ORAL | 0 refills | Status: AC

## 2018-12-28 MED ORDER — ALBUTEROL SULFATE HFA 108 (90 BASE) MCG/ACT IN AERS: 2.0000 | INHALATION_SPRAY | Freq: Four times a day (QID) | RESPIRATORY_TRACT | 0 refills | Status: DC | PRN

## 2018-12-28 MED ORDER — DEXAMETHASONE SODIUM PHOSPHATE 10 MG/ML IJ SOLN
10.0000 mg | Freq: Once | INTRAMUSCULAR | Status: AC
  Administered 2018-12-28: 10 mg via INTRAVENOUS
  Filled 2018-12-28: qty 1

## 2018-12-28 NOTE — ED Provider Notes (Signed)
[facility] Emergency Department Provider Note    First MD Initiated Contact with Patient 12/28/18 [phone]     (approximate)  I have reviewed the triage vital signs and the nursing notes.   HISTORY  Chief Complaint Shortness of Breath    HPI [name] is a 58 y.o. male below listed past medical history presents the ER for cough and shortness of breath.  Denies any measured fevers.  States the symptoms have been ongoing for about 3 days.  States he ran out of his inhalers as well.  Has not been around anyone sick or diagnosed with COVID.  Denies any abdominal pain nausea vomiting or diarrhea.  Denies any chest pain at this time.    Past Medical History:  Diagnosis Date  . Alcohol abuse   . Bipolar 1 disorder (HCC)   . COPD (chronic obstructive pulmonary disease) (HCC)   . Hip pain    No family history on file. Past Surgical History:  Procedure Laterality Date  . AORTIC VALVE REPLACEMENT     There are no active problems to display for this patient.     Prior to Admission medications   Medication Sig Start Date End Date Taking? Authorizing Provider  albuterol (VENTOLIN HFA) 108 (90 Base) MCG/ACT inhaler Inhale 2 puffs into the lungs every 6 (six) hours as needed for wheezing or shortness of breath. 12/28/18   Willy Eddy, MD  aspirin 325 MG tablet Take 325 mg by mouth daily.    [provider]  azithromycin (ZITHROMAX) 500 MG tablet Take 1 tablet (500 mg total) by mouth daily for 3 days. Take 1 tablet daily for 3 days. 12/28/18 12/31/18  Willy Eddy, MD  cephALEXin (KEFLEX) 500 MG capsule Take 1 capsule (500 mg total) by mouth 3 (three) times daily for 7 days. 12/28/18 01/04/19  Willy Eddy, MD  predniSONE (DELTASONE) 20 MG tablet Take 2 tablets (40 mg total) by mouth daily for 5 days. 12/28/18 01/02/19  Willy Eddy, MD    Allergies Patient has no known allergies.    Social History Social History   Tobacco Use   . Smoking status: Current Every Day Smoker    Packs/day: 1.00    Types: Cigarettes  . Smokeless tobacco: Never Used  Substance Use Topics  . Alcohol use: Yes  . Drug use: Yes    Types: Cocaine    Review of Systems Patient denies headaches, rhinorrhea, blurry vision, numbness, shortness of breath, chest pain, edema, cough, abdominal pain, nausea, vomiting, diarrhea, dysuria, fevers, rashes or hallucinations unless otherwise stated above in HPI. ____________________________________________   PHYSICAL EXAM:  VITAL SIGNS: Vitals:   12/28/18 1200 12/28/18 1415  BP: (!) 106/59 136/78  Pulse:  (!) 108  Resp:  18  Temp:    SpO2:  98%    Constitutional: Alert and oriented.  Eyes: Conjunctivae are normal.  Head: Atraumatic. Nose: No congestion/rhinnorhea. Mouth/Throat: Mucous membranes are moist.   Neck: No stridor. Painless ROM.  Cardiovascular: Normal rate, regular rhythm. Grossly normal heart sounds.  Good peripheral circulation. Respiratory: Normal respiratory effort.  No retractions. Lungs with scattered coarse wheeze throughout. Gastrointestinal: Soft and nontender. No distention. No abdominal bruits. No CVA tenderness. Genitourinary:  Musculoskeletal: No lower extremity tenderness nor edema.  No joint effusions. Neurologic:  Normal speech and language. No gross focal neurologic deficits are appreciated. No facial droop Skin:  Skin is warm, dry and intact. No rash noted. Psychiatric: Mood and affect are normal. Speech and behavior  are normal.  ____________________________________________   LABS (all labs ordered are listed, but only abnormal results are displayed)  Results for orders placed or performed during the hospital encounter of 12/28/18 (from the past 24 hour(s))  CBC with Differential/Platelet     Status: None   Collection Time: 12/28/18  9:45 AM  Result Value Ref Range   WBC 6.7 4.0 - 10.5 K/uL   RBC 4.72 4.22 - 5.81 MIL/uL   Hemoglobin 13.4 13.0 - 17.0  g/dL   HCT 40.8 39.0 - 52.0 %   MCV 86.4 80.0 - 100.0 fL   MCH 28.4 26.0 - 34.0 pg   MCHC 32.8 30.0 - 36.0 g/dL   RDW 13.8 11.5 - 15.5 %   Platelets 195 150 - 400 K/uL   nRBC 0.0 0.0 - 0.2 %   Neutrophils Relative % 71 %   Neutro Abs 4.8 1.7 - 7.7 K/uL   Lymphocytes Relative 17 %   Lymphs Abs 1.1 0.7 - 4.0 K/uL   Monocytes Relative 9 %   Monocytes Absolute 0.6 0.1 - 1.0 K/uL   Eosinophils Relative 2 %   Eosinophils Absolute 0.1 0.0 - 0.5 K/uL   Basophils Relative 0 %   Basophils Absolute 0.0 0.0 - 0.1 K/uL   Immature Granulocytes 1 %   Abs Immature Granulocytes 0.04 0.00 - 0.07 K/uL  Comprehensive metabolic panel     Status: Abnormal   Collection Time: 12/28/18  9:45 AM  Result Value Ref Range   Sodium 134 (L) 135 - 145 mmol/L   Potassium 5.1 3.5 - 5.1 mmol/L   Chloride 103 98 - 111 mmol/L   CO2 25 22 - 32 mmol/L   Glucose, Bld 122 (H) 70 - 99 mg/dL   BUN 11 6 - 20 mg/dL   Creatinine, Ser 1.07 0.61 - 1.24 mg/dL   Calcium 8.9 8.9 - 10.3 mg/dL   Total Protein 7.2 6.5 - 8.1 g/dL   Albumin 3.7 3.5 - 5.0 g/dL   AST 49 (H) 15 - 41 U/L   ALT 32 0 - 44 U/L   Alkaline Phosphatase 132 (H) 38 - 126 U/L   Total Bilirubin 1.4 (H) 0.3 - 1.2 mg/dL   GFR calc non Af Amer >60 >60 mL/min   GFR calc Af Amer >60 >60 mL/min   Anion gap 6 5 - 15   ____________________________________________  EKG My review and personal interpretation at Time: 9:27   Indication: sob  Rate: 90  Rhythm: sinus Axis: normal Other: no stemi criteria, non specific t wave abn ____________________________________________  RADIOLOGY  I personally reviewed all radiographic images ordered to evaluate for the above acute complaints and reviewed radiology reports and findings.  These findings were personally discussed with the patient.  Please see medical record for radiology report.  ____________________________________________   PROCEDURES  Procedure(s) performed:  Procedures    Critical Care performed:  no ____________________________________________   INITIAL IMPRESSION / ASSESSMENT AND PLAN / ED COURSE  Pertinent labs & imaging results that were available during my care of the patient were reviewed by me and considered in my medical decision making (see chart for details).   DDX: Asthma, copd, CHF, pna, ptx, malignancy, Pe, anemia   [name] is a 58 y.o. who presents to the ED with symptoms as described above.  Have a high suspicion for COPD exacerbation versus possible pneumonia.  Doubt PE.  Has diffuse wheezing.  Will treat for acute bronchitis and reassess.  Clinical Course as of  Dec 28 1418  Mon Dec 28, 2018  1410 Patient feels significantly improved after nebulizer treatment.  He does not have any hypoxia with ambulation.  Patient with good air movement throughout.  This is consistent with bronchitis.  Will be given antibiotics.  Discussed self quarantine and isolation measures pending COVID test.  We discussed signs and symptoms for which he should return to the ER.   [PR]    Clinical Course User Index [PR] Willy Eddy, MD    The patient was evaluated in Emergency Department today for the symptoms described in the history of present illness. He/she was evaluated in the context of the global COVID-19 pandemic, which necessitated consideration that the patient might be at risk for infection with the SARS-CoV-2 virus that causes COVID-19. Institutional protocols and algorithms that pertain to the evaluation of patients at risk for COVID-19 are in a state of rapid change based on information released by regulatory bodies including the CDC and federal and state organizations. These policies and algorithms were followed during the patient's care in the ED.  As part of my medical decision making, I reviewed the following data within the electronic MEDICAL RECORD NUMBER Nursing notes reviewed and incorporated, Labs reviewed, notes from prior ED visits and [place] Controlled Substance  Database   ____________________________________________   FINAL CLINICAL IMPRESSION(S) / ED DIAGNOSES  Final diagnoses:  SOB (shortness of breath)  Bronchitis      NEW MEDICATIONS STARTED DURING THIS VISIT:  Discharge Medication List as of 12/28/2018  2:10 PM    START taking these medications   Details  albuterol (VENTOLIN HFA) 108 (90 Base) MCG/ACT inhaler Inhale 2 puffs into the lungs every 6 (six) hours as needed for wheezing or shortness of breath., Starting Mon 12/28/2018, Normal    azithromycin (ZITHROMAX) 500 MG tablet Take 1 tablet (500 mg total) by mouth daily for 3 days. Take 1 tablet daily for 3 days., Starting Mon 12/28/2018, Until Thu 12/31/2018, Normal    cephALEXin (KEFLEX) 500 MG capsule Take 1 capsule (500 mg total) by mouth 3 (three) times daily for 7 days., Starting Mon 12/28/2018, Until Mon 01/04/2019, Normal    predniSONE (DELTASONE) 20 MG tablet Take 2 tablets (40 mg total) by mouth daily for 5 days., Starting Mon 12/28/2018, Until Sat 01/02/2019, Normal         Note:  This document was prepared using Dragon voice recognition software and may include unintentional dictation errors.    Willy Eddy, MD 12/28/18 1420

## 2018-12-28 NOTE — ED Triage Notes (Signed)
Pt c/o SOB and cough x4 days. PT states worse at night. PT also c/o abd pain. Denies vomiting or fever. NAD noted

## 2018-12-28 NOTE — ED Notes (Signed)
Pt 100% on RA while ambulating. PT states his SOB and cough have improved. MD made aware

## 2018-12-28 NOTE — ED Notes (Signed)
Pt will not stop talking on cell phone to be assessed

## 2018-12-28 NOTE — ED Notes (Signed)
Pt refuses to keep O2 sensor on finger

## 2019-01-06 ENCOUNTER — Inpatient Hospital Stay
Admission: EM | Admit: 2019-01-06 | Discharge: 2019-01-08 | DRG: 293 | Disposition: A | Discharge status: Home / Self Care | Payer: Medicaid Other | Attending: Internal Medicine | Admitting: Internal Medicine

## 2019-01-06 ENCOUNTER — Emergency Department: Payer: Medicaid Other

## 2019-01-06 ENCOUNTER — Other Ambulatory Visit: Payer: Self-pay

## 2019-01-06 DIAGNOSIS — I340 Nonrheumatic mitral (valve) insufficiency: Secondary | ICD-10-CM | POA: Diagnosis present

## 2019-01-06 DIAGNOSIS — F319 Bipolar disorder, unspecified: Secondary | ICD-10-CM | POA: Diagnosis present

## 2019-01-06 DIAGNOSIS — Z791 Long term (current) use of non-steroidal anti-inflammatories (NSAID): Secondary | ICD-10-CM

## 2019-01-06 DIAGNOSIS — F17210 Nicotine dependence, cigarettes, uncomplicated: Secondary | ICD-10-CM | POA: Diagnosis present

## 2019-01-06 DIAGNOSIS — I5043 Acute on chronic combined systolic (congestive) and diastolic (congestive) heart failure: Secondary | ICD-10-CM | POA: Diagnosis present

## 2019-01-06 DIAGNOSIS — R0601 Orthopnea: Secondary | ICD-10-CM

## 2019-01-06 DIAGNOSIS — I272 Pulmonary hypertension, unspecified: Secondary | ICD-10-CM | POA: Diagnosis present

## 2019-01-06 DIAGNOSIS — F1010 Alcohol abuse, uncomplicated: Secondary | ICD-10-CM | POA: Diagnosis present

## 2019-01-06 DIAGNOSIS — I110 Hypertensive heart disease with heart failure: Principal | ICD-10-CM | POA: Diagnosis present

## 2019-01-06 DIAGNOSIS — Z23 Encounter for immunization: Secondary | ICD-10-CM

## 2019-01-06 DIAGNOSIS — E119 Type 2 diabetes mellitus without complications: Secondary | ICD-10-CM | POA: Diagnosis present

## 2019-01-06 DIAGNOSIS — Z7982 Long term (current) use of aspirin: Secondary | ICD-10-CM

## 2019-01-06 DIAGNOSIS — J449 Chronic obstructive pulmonary disease, unspecified: Secondary | ICD-10-CM | POA: Diagnosis present

## 2019-01-06 DIAGNOSIS — J90 Pleural effusion, not elsewhere classified: Secondary | ICD-10-CM | POA: Diagnosis present

## 2019-01-06 DIAGNOSIS — Z20828 Contact with and (suspected) exposure to other viral communicable diseases: Secondary | ICD-10-CM | POA: Diagnosis present

## 2019-01-06 DIAGNOSIS — Z79899 Other long term (current) drug therapy: Secondary | ICD-10-CM

## 2019-01-06 DIAGNOSIS — J811 Chronic pulmonary edema: Secondary | ICD-10-CM

## 2019-01-06 DIAGNOSIS — Z952 Presence of prosthetic heart valve: Secondary | ICD-10-CM

## 2019-01-06 LAB — CBC
HCT: 41.3 % (ref 39.0–52.0)
Hemoglobin: 13.8 g/dL (ref 13.0–17.0)
MCH: 28 pg (ref 26.0–34.0)
MCHC: 33.4 g/dL (ref 30.0–36.0)
MCV: 83.9 fL (ref 80.0–100.0)
Platelets: 171 10*3/uL (ref 150–400)
RBC: 4.92 MIL/uL (ref 4.22–5.81)
RDW: 14.5 % (ref 11.5–15.5)
WBC: 7.6 10*3/uL (ref 4.0–10.5)
nRBC: 0 % (ref 0.0–0.2)

## 2019-01-06 LAB — COMPREHENSIVE METABOLIC PANEL
ALT: 41 U/L (ref 0–44)
AST: 44 U/L — ABNORMAL HIGH (ref 15–41)
Albumin: 3.6 g/dL (ref 3.5–5.0)
Alkaline Phosphatase: 115 U/L (ref 38–126)
Anion gap: 9 (ref 5–15)
BUN: 14 mg/dL (ref 6–20)
CO2: 24 mmol/L (ref 22–32)
Calcium: 9 mg/dL (ref 8.9–10.3)
Chloride: 99 mmol/L (ref 98–111)
Creatinine, Ser: 1.06 mg/dL (ref 0.61–1.24)
GFR calc Af Amer: >60 mL/min (ref >60)
GFR calc non Af Amer: >60 mL/min (ref >60)
Glucose, Bld: 104 mg/dL — ABNORMAL HIGH (ref 70–99)
Potassium: 4.3 mmol/L (ref 3.5–5.1)
Sodium: 132 mmol/L — ABNORMAL LOW (ref 135–145)
Total Bilirubin: 1.6 mg/dL — ABNORMAL HIGH (ref 0.3–1.2)
Total Protein: 6.8 g/dL (ref 6.5–8.1)

## 2019-01-06 LAB — TROPONIN I (HIGH SENSITIVITY): Troponin I (High Sensitivity): 19 ng/L — ABNORMAL HIGH (ref <18)

## 2019-01-06 LAB — BRAIN NATRIURETIC PEPTIDE: B Natriuretic Peptide: 1112 pg/mL — ABNORMAL HIGH (ref 0.0–100.0)

## 2019-01-06 LAB — MAGNESIUM: Magnesium: 1.8 mg/dL (ref 1.7–2.4)

## 2019-01-06 MED ORDER — NICOTINE 7 MG/24HR TD PT24
7.0000 mg | MEDICATED_PATCH | Freq: Every day | TRANSDERMAL | Status: DC
  Administered 2019-01-06 – 2019-01-08 (×2): 7 mg via TRANSDERMAL
  Filled 2019-01-06 (×4): qty 1

## 2019-01-06 MED ORDER — MIRTAZAPINE 15 MG PO TABS
15.0000 mg | ORAL_TABLET | Freq: Every day | ORAL | Status: DC
  Administered 2019-01-06 – 2019-01-07 (×2): 15 mg via ORAL
  Filled 2019-01-06 (×2): qty 1

## 2019-01-06 MED ORDER — ALBUTEROL SULFATE HFA 108 (90 BASE) MCG/ACT IN AERS
2.0000 | INHALATION_SPRAY | Freq: Once | RESPIRATORY_TRACT | Status: AC
  Administered 2019-01-06: 2 via RESPIRATORY_TRACT
  Filled 2019-01-06: qty 6.7

## 2019-01-06 MED ORDER — GABAPENTIN 300 MG PO CAPS
300.0000 mg | ORAL_CAPSULE | Freq: Three times a day (TID) | ORAL | Status: DC
  Administered 2019-01-06 – 2019-01-08 (×5): 300 mg via ORAL
  Filled 2019-01-06 (×5): qty 1

## 2019-01-06 MED ORDER — FUROSEMIDE 10 MG/ML IJ SOLN
40.0000 mg | Freq: Once | INTRAMUSCULAR | Status: AC
  Administered 2019-01-06: 40 mg via INTRAVENOUS
  Filled 2019-01-06: qty 4

## 2019-01-06 NOTE — ED Provider Notes (Signed)
[facility] Emergency Department Provider Note   ____________________________________________   First MD Initiated Contact with Patient 01/06/19 1930     (approximate)  I have reviewed the triage vital signs and the nursing notes.   HISTORY  Chief Complaint Shortness of Breath    HPI [name] is a 58 y.o. male with past medical history of COPD, aortic valve replacement, mitral valve replacement, and alcohol abuse presents to the ED complaining of shortness of breath.  Patient reports that he has had a nonproductive cough for the past 1 to 2 weeks and feels more short of breath when he goes to lay flat.  He describes a feeling like his chest is full and that there is fluid.  He denies any associated chest pain, has not noticed any pain or swelling in his lower extremities.  He states he was seen here last week and prescribed antibiotics and steroids for possible bronchitis.  He states that he seemed to get better briefly but symptoms have since recurred.  He does admit to not completing the full course of antibiotics because they upset his stomach.  He has not had any fevers and denies any sick contacts.  He did have COVID-19 testing performed during last visit that was negative.        Past Medical History:  Diagnosis Date  . Alcohol abuse   . Bipolar 1 disorder (HCC)   . COPD (chronic obstructive pulmonary disease) (HCC)   . Hip pain     Patient Active Problem List   Diagnosis Date Noted  . Pleural effusion 01/06/2019  . Mitral regurgitation 01/06/2019  . Pulmonary hypertension (HCC) 01/06/2019  . COPD (chronic obstructive pulmonary disease) (HCC) 01/06/2019  . Bipolar 1 disorder (HCC) 01/06/2019    Past Surgical History:  Procedure Laterality Date  . AORTIC VALVE REPLACEMENT      Prior to Admission medications   Medication Sig Start Date End Date Taking? Authorizing Provider  acetaminophen (TYLENOL) 500 MG tablet Take 1,000 mg by mouth  every 8 (eight) hours. 10/30/18 10/30/19 Yes [provider]  aspirin 325 MG tablet Take 325 mg by mouth daily.   Yes [provider]  atorvastatin (LIPITOR) 40 MG tablet Take 40 mg by mouth daily. 10/30/18 10/30/19 Yes [provider]  DULoxetine (CYMBALTA) 30 MG capsule Take 30 mg by mouth daily. 09/08/18  Yes [provider]  FLUoxetine (PROZAC) 20 MG capsule Take 20 mg by mouth every morning. 08/26/18  Yes [provider]  gabapentin (NEURONTIN) 300 MG capsule Take 300 mg by mouth 3 (three) times daily. 04/14/18 04/14/19 Yes [provider]  glipiZIDE (GLUCOTROL) 5 MG tablet Take 5 mg by mouth daily before breakfast.   Yes [provider]  lisinopril (ZESTRIL) 5 MG tablet Take 5 mg by mouth daily. 12/23/18  Yes [provider]  meloxicam (MOBIC) 15 MG tablet Take 15 mg by mouth daily. 10/30/18  Yes [provider]  metoprolol tartrate (LOPRESSOR) 25 MG tablet Take 25 mg by mouth daily. 08/26/18  Yes [provider]  mirtazapine (REMERON) 15 MG tablet Take 15 mg by mouth at bedtime. 09/08/18 09/08/19 Yes [provider]  albuterol (VENTOLIN HFA) 108 (90 Base) MCG/ACT inhaler Inhale 2 puffs into the lungs every 6 (six) hours as needed for wheezing or shortness of breath. 12/28/18   Willy Eddyobinson, Patrick, MD  naproxen (NAPROSYN) 500 MG tablet Take 500 mg by mouth 2 (two) times daily with a meal.  [provider]    Allergies Patient has no known allergies.  No family history on file.  Social History Social History   Tobacco Use  . Smoking status: Current Every Day Smoker    Packs/day: 1.00    Types: Cigarettes  . Smokeless tobacco: Never Used  Substance Use Topics  . Alcohol use: Yes  . Drug use: Yes    Types: Cocaine    Review of Systems  Constitutional: No fever/chills Eyes: No visual changes. ENT: No sore throat. Cardiovascular: Denies chest pain. Respiratory: Positive for shortness of  breath and cough. Gastrointestinal: No abdominal pain.  No nausea, no vomiting.  No diarrhea.  No constipation. Genitourinary: Negative for dysuria. Musculoskeletal: Negative for back pain. Skin: Negative for rash. Neurological: Negative for headaches, focal weakness or numbness.  ____________________________________________   PHYSICAL EXAM:  VITAL SIGNS: ED Triage Vitals [01/06/19 1917]  Enc Vitals Group     BP 120/80     Pulse Rate 82     Resp 20     Temp 98.8 F (37.1 C)     Temp Source Oral     SpO2 100 %     Weight 150 lb (68 kg)     Height 5\' 8"  (1.727 m)     Head Circumference      Peak Flow      Pain Score 8     Pain Loc      Pain Edu?      Excl. in [place]?     Constitutional: Alert and oriented. Eyes: Conjunctivae are normal. Head: Atraumatic. Nose: No congestion/rhinnorhea. Mouth/Throat: Mucous membranes are moist. Neck: Normal ROM Cardiovascular: Normal rate, regular rhythm. Grossly normal heart sounds. Respiratory: Normal respiratory effort.  No retractions.  Crackles at bilateral bases, no significant wheezing noted. Gastrointestinal: Soft and nontender. No distention. Genitourinary: deferred Musculoskeletal: No lower extremity tenderness nor edema. Neurologic:  Normal speech and language. No gross focal neurologic deficits are appreciated. Skin:  Skin is warm, dry and intact. No rash noted. Psychiatric: Mood and affect are normal. Speech and behavior are normal.  ____________________________________________   LABS (all labs ordered are listed, but only abnormal results are displayed)  Labs Reviewed  COMPREHENSIVE METABOLIC PANEL - Abnormal; Notable for the following components:      Result Value   Sodium 132 (*)    Glucose, Bld 104 (*)    AST 44 (*)    Total Bilirubin 1.6 (*)    All other components within normal limits  BRAIN NATRIURETIC PEPTIDE - Abnormal; Notable for the following components:   B Natriuretic Peptide 1,112.0 (*)    All other  components within normal limits  TROPONIN I (HIGH SENSITIVITY) - Abnormal; Notable for the following components:   Troponin I (High Sensitivity) 19 (*)    All other components within normal limits  SARS CORONAVIRUS 2 (HOSPITAL ORDER, [place] [facility])  CBC  MAGNESIUM  TROPONIN I (HIGH SENSITIVITY)   ____________________________________________  EKG  ED ECG REPORT I, Blake Divine, the attending physician, personally viewed and interpreted this ECG.   Date: 01/06/2019  EKG Time: 19:21  Rate: 90  Rhythm: Multifocal atrial tachycardia  Axis: RAD  Intervals:none  ST&T Change: Anterior TWI    PROCEDURES  Procedure(s) performed (including Critical Care):  Procedures   ____________________________________________   INITIAL IMPRESSION / ASSESSMENT AND PLAN / ED COURSE       58 year old male with history of COPD, aortic valve replacement, and mitral valve replacement, presents to  the ED for shortness of breath when lying flat as well as some dyspnea on exertion.  He denies any chest pain and EKG does not have any acute ischemic changes, anterior T wave inversions are unchanged from prior.  He does have an unusual rhythm on his EKG, appears most consistent with multifocal atrial Tachycardia.  Unclear if this or some other pulmonary problem is the source of his respiratory symptoms.  He does not have any significant wheezing on exam, although chest x-ray does show right-sided pleural effusion that appears slightly enlarged from prior evaluation.  Will need to assess for pneumonia versus new onset CHF versus worsening mitral regurgitation.  Given chest x-ray findings and elevated BNP, diuresis was initiated with IV Lasix.  Troponin with very mild elevation, doubt this represents ACS.  Patient's COVID testing is negative.  Case discussed with hospitalist, who accepts patient for admission for further management of pleural effusion and possible new onset CHF.       ____________________________________________   FINAL CLINICAL IMPRESSION(S) / ED DIAGNOSES  Final diagnoses:  Orthopnea  Pleural effusion  Mitral valve insufficiency, unspecified etiology     ED Discharge Orders    None       Note:  This document was prepared using Dragon voice recognition software and may include unintentional dictation errors.   Chesley Noon, MD 01/07/19 [phone]

## 2019-01-06 NOTE — ED Notes (Signed)
ED TO INPATIENT HANDOFF REPORT  ED Nurse Name and Phone #:  Dorothyann Gibbs [phone]  S Name/Age/Gender [name] 58 y.o. male Room/Bed: ED07A/ED07A  Code Status   Code Status: Prior  Home/SNF/Other Home Patient oriented to: self, place, time and situation Is this baseline? Yes   Triage Complete: Triage complete  Chief Complaint Shob   Triage Note Pt in with co tightness in throat and "head feels full" at times. STates has had a cough recently that is non productive. STates gets more shob when he lays down, was seen here last week for the same and dx with bronchitis. No shob at this time, pt able to talk in complete sentences, here for persistent symptoms. Was tested for covid, unsure of results. Was given antibiotics and steroids.    Allergies No Known Allergies  Level of Care/Admitting Diagnosis ED Disposition    ED Disposition Condition Comment   Admit  The patient appears reasonably stabilized for admission considering the current resources, flow, and capabilities available in the ED at this time, and I doubt any other [facility] requiring further screening and/or treatment in the ED prior to admission is  present.       B Medical/Surgery History Past Medical History:  Diagnosis Date  . Alcohol abuse   . Bipolar 1 disorder ([place])   . COPD (chronic obstructive pulmonary disease) ([place])   . Hip pain    Past Surgical History:  Procedure Laterality Date  . AORTIC VALVE REPLACEMENT       A IV Location/Drains/Wounds Patient Lines/Drains/Airways Status   Active Line/Drains/Airways    Name:   Placement date:   Placement time:   Site:   Days:   Peripheral IV 01/06/19 Left;Upper Forearm   01/06/19    2159    Forearm   less than 1          Intake/Output Last 24 hours  Intake/Output Summary (Last 24 hours) at 01/06/2019 2325 Last data filed at 01/06/2019 2321 Gross per 24 hour  Intake -  Output 900 ml  Net -900 ml    Labs/Imaging Results for orders placed or performed  during the hospital encounter of 01/06/19 (from the past 48 hour(s))  CBC     Status: None   Collection Time: 01/06/19  7:22 PM  Result Value Ref Range   WBC 7.6 4.0 - 10.5 K/uL   RBC 4.92 4.22 - 5.81 MIL/uL   Hemoglobin 13.8 13.0 - 17.0 g/dL   HCT 41.3 39.0 - 52.0 %   MCV 83.9 80.0 - 100.0 fL   MCH 28.0 26.0 - 34.0 pg   MCHC 33.4 30.0 - 36.0 g/dL   RDW 14.5 11.5 - 15.5 %   Platelets 171 150 - 400 K/uL   nRBC 0.0 0.0 - 0.2 %    Comment: Performed at [facility], [address] [place], [place] [address]  Comprehensive metabolic panel     Status: Abnormal   Collection Time: 01/06/19  7:22 PM  Result Value Ref Range   Sodium 132 (L) 135 - 145 mmol/L   Potassium 4.3 3.5 - 5.1 mmol/L   Chloride 99 98 - 111 mmol/L   CO2 24 22 - 32 mmol/L   Glucose, Bld 104 (H) 70 - 99 mg/dL   BUN 14 6 - 20 mg/dL   Creatinine, Ser 1.06 0.61 - 1.24 mg/dL   Calcium 9.0 8.9 - 10.3 mg/dL   Total Protein 6.8 6.5 - 8.1 g/dL   Albumin 3.6 3.5 -  5.0 g/dL   AST 44 (H) 15 - 41 U/L   ALT 41 0 - 44 U/L   Alkaline Phosphatase 115 38 - 126 U/L   Total Bilirubin 1.6 (H) 0.3 - 1.2 mg/dL   GFR calc non Af Amer >60 >60 mL/min   GFR calc Af Amer >60 >60 mL/min   Anion gap 9 5 - 15    Comment: Performed at [facility], [address] [place], [place] [address]  Troponin I (High Sensitivity)     Status: Abnormal   Collection Time: 01/06/19  7:22 PM  Result Value Ref Range   Troponin I (High Sensitivity) 19 (H) <18 ng/L    Comment: (NOTE) Elevated high sensitivity troponin I (hsTnI) values and significant  changes across serial measurements may suggest ACS but many other  chronic and acute conditions are known to elevate hsTnI results.  Refer to the "Links" section for chest pain algorithms and additional  guidance. Performed at [facility], [address] [place], [place] [address]   Brain natriuretic peptide     Status: Abnormal   Collection Time: 01/06/19  7:22 PM  Result  Value Ref Range   B Natriuretic Peptide 1,112.0 (H) 0.0 - 100.0 pg/mL    Comment: Performed at [facility], [address] [place], [place] [address]  Magnesium     Status: None   Collection Time: 01/06/19  7:22 PM  Result Value Ref Range   Magnesium 1.8 1.7 - 2.4 mg/dL    Comment: Performed at [facility], [address] [place], [place] [address]   Dg Chest 2 View  Result Date: 01/06/2019 CLINICAL DATA:  Shortness of breath. EXAM: CHEST - 2 VIEW COMPARISON:  Radiograph 12/28/2018 FINDINGS: Post median sternotomy with prosthetic valve. Similar cardiomegaly and mediastinal contours. Worsening pleuroparenchymal opacity at the right lung base with increasing pleural effusion. Probable fluid in the minor fissure. There is vascular congestion without overt pulmonary edema. No pneumothorax. Unchanged retrocardiac atelectasis. IMPRESSION: 1. Increasing right pleuroparenchymal opacity and pleural effusion over the past 2 days. 2. Cardiomegaly with vascular congestion. Electronically Signed   By: Narda Rutherford M.D.   On: 01/06/2019 20:04    Pending Labs Unresulted Labs (From admission, onward)    Start     Ordered   01/06/19 2120  SARS Coronavirus 2 [facility] order, Performed in [facility] hospital lab) Nasopharyngeal Nasopharyngeal Swab  (Symptomatic/High Risk of Exposure/Tier 1 Patients Labs with Precautions)  Once,   STAT    Question Answer Comment  Is this test for diagnosis or screening Diagnosis of ill patient   Symptomatic for COVID-19 as defined by CDC Yes   Date of Symptom Onset 12/29/2018   Hospitalized for COVID-19 Yes   Admitted to ICU for COVID-19 No   Previously tested for COVID-19 Yes   Resident in a congregate (group) care setting No   Employed in healthcare setting No      01/06/19 2119          Vitals/Pain Today's Vitals   01/06/19 2030 01/06/19 2130 01/06/19 2230 01/06/19 2300  BP: 125/74 122/65 138/85 128/76  Pulse: 74 84 91 84  Resp:       Temp:      TempSrc:      SpO2: 99% 99% 98% 98%  Weight:      Height:      PainSc:        Isolation Precautions Airborne and Contact precautions  Medications Medications  mirtazapine (REMERON)  tablet 15 mg (15 mg Oral Given 01/06/19 2318)  gabapentin (NEURONTIN) capsule 300 mg (300 mg Oral Given 01/06/19 2318)  nicotine (NICODERM CQ - dosed in mg/24 hr) patch 7 mg (7 mg Transdermal Patch Applied 01/06/19 2314)  albuterol (VENTOLIN HFA) 108 (90 Base) MCG/ACT inhaler 2 puff (2 puffs Inhalation Given 01/06/19 2017)  furosemide (LASIX) injection 40 mg (40 mg Intravenous Given 01/06/19 2200)    Mobility walks Low fall risk   Focused Assessments n/a   R Recommendations: See Admitting Provider Note  Report given to:   Additional Notes: n/a

## 2019-01-06 NOTE — ED Notes (Signed)
Pt given phone again to talk to family.

## 2019-01-06 NOTE — ED Notes (Signed)
Pt given crackers and peanut butter and grape juice.

## 2019-01-06 NOTE — H&P (Signed)
[facility] [place] [facility]   PATIENT NAME: [name]    MR#:  [id number]  DATE OF BIRTH:  [birth year]  DATE OF ADMISSION:  01/06/2019  PRIMARY CARE PHYSICIAN: [facility] [employer]   REQUESTING/REFERRING PHYSICIAN: Larinda Buttery, MD  CHIEF COMPLAINT:   Chief Complaint  Patient presents with  . Shortness of Breath    HISTORY OF PRESENT ILLNESS:  [name]  is a 58 y.o. male who presents with chief complaint as above.  Patient presents the ED with a complaint of progressive shortness of breath.  He states that he was seen here last week for similar complaint, since that time it is gotten worse.  He is developed orthopnea as well.  He does have a history of aortic and mitral valve procedures.  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  Evaluation here in the ED shows vascular congestion as well as right-sided pleural effusion.  His BNP was also elevated.  Hospitalist called for admission  PAST MEDICAL HISTORY:   Past Medical History:  Diagnosis Date  . Alcohol abuse   . Bipolar 1 disorder (HCC)   . COPD (chronic obstructive pulmonary disease) (HCC)   . Hip pain      PAST SURGICAL HISTORY:   Past Surgical History:  Procedure Laterality Date  . AORTIC VALVE REPLACEMENT       SOCIAL HISTORY:   Social History   Tobacco Use  . Smoking status: Current Every Day Smoker    Packs/day: 1.00    Types: Cigarettes  . Smokeless tobacco: Never Used  Substance Use Topics  . Alcohol use: Yes     FAMILY HISTORY:    Family history reviewed and is non-contributory DRUG ALLERGIES:  No Known Allergies  MEDICATIONS AT HOME:   Prior to Admission medications   Medication Sig Start Date End Date Taking? Authorizing Provider  acetaminophen (TYLENOL) 500 MG tablet Take 1,000 mg by mouth every 8 (eight) hours. 10/30/18 10/30/19 Yes [provider]  aspirin 325 MG tablet Take 325 mg by mouth daily.   Yes [provider]  atorvastatin (LIPITOR) 40 MG tablet Take 40 mg by mouth daily. 10/30/18 10/30/19 Yes [provider]  DULoxetine (CYMBALTA) 30 MG capsule Take 30 mg by mouth daily. 09/08/18  Yes [provider]  FLUoxetine (PROZAC) 20 MG capsule Take 20 mg by mouth every morning. 08/26/18  Yes [provider]  gabapentin (NEURONTIN) 300 MG capsule Take 300 mg by mouth 3 (three) times daily. 04/14/18 04/14/19 Yes [provider]  glipiZIDE (GLUCOTROL) 5 MG tablet Take 5 mg by mouth daily before breakfast.   Yes [provider]  lisinopril (ZESTRIL) 5 MG tablet Take 5 mg by mouth daily. 12/23/18  Yes [provider]  meloxicam (MOBIC) 15 MG tablet Take 15 mg by mouth daily. 10/30/18  Yes [provider]  metoprolol tartrate (LOPRESSOR) 25 MG tablet Take 25 mg by mouth daily. 08/26/18  Yes [provider]  mirtazapine (REMERON) 15 MG tablet Take 15 mg by mouth at bedtime. 09/08/18 09/08/19 Yes [provider]  albuterol (VENTOLIN HFA) 108 (90 Base) MCG/ACT inhaler Inhale 2 puffs into the lungs every 6 (six) hours as needed for wheezing or shortness of breath. 12/28/18   Willy Eddy, MD  naproxen (NAPROSYN) 500 MG tablet Take 500 mg by mouth 2 (two) times daily with a meal.    [provider]    REVIEW OF SYSTEMS:  Review of Systems  Constitutional: Negative for chills, fever, malaise/fatigue and weight loss.  HENT: Negative for ear pain, hearing loss and tinnitus.   Eyes: Negative for blurred vision, double vision, pain and redness.  Respiratory: Positive for shortness of breath. Negative for cough and hemoptysis.   Cardiovascular: Positive for orthopnea. Negative for chest pain, palpitations and leg swelling.  Gastrointestinal: Negative for abdominal pain, constipation, diarrhea, nausea and vomiting.  Genitourinary: Negative for dysuria, frequency and hematuria.  Musculoskeletal: Negative for back pain, joint pain and neck pain.   Skin:       No acne, rash, or lesions  Neurological: Negative for dizziness, tremors, focal weakness and weakness.  Endo/Heme/Allergies: Negative for polydipsia. Does not bruise/bleed easily.  Psychiatric/Behavioral: Negative for depression. The patient is not nervous/anxious and does not have insomnia.      VITAL SIGNS:   Vitals:   01/06/19 1917 01/06/19 2000 01/06/19 2030 01/06/19 2130  BP: 120/80 114/76 125/74 122/65  Pulse: 82 83 74 84  Resp: 20     Temp: 98.8 F (37.1 C)     TempSrc: Oral     SpO2: 100% 96% 99% 99%  Weight: 68 kg     Height: 5\' 8"  (1.727 m)      Wt Readings from Last 3 Encounters:  01/06/19 68 kg  12/28/18 68 kg    PHYSICAL EXAMINATION:  Physical Exam  Vitals reviewed. Constitutional: He is oriented to person, place, and time. He appears well-developed and well-nourished. No distress.  HENT:  Head: Normocephalic and atraumatic.  Mouth/Throat: Oropharynx is clear and moist.  Eyes: Pupils are equal, round, and reactive to light. Conjunctivae and EOM are normal. No scleral icterus.  Neck: Normal range of motion. Neck supple. No JVD present. No thyromegaly present.  Cardiovascular: Normal rate, regular rhythm and intact distal pulses. Exam reveals no gallop and no friction rub.  No murmur heard. Respiratory: Effort normal. No respiratory distress. He has no wheezes. He has rales.  GI: Soft. Bowel sounds are normal. He exhibits no distension. There is no abdominal tenderness.  Musculoskeletal: Normal range of motion.        General: No edema.     Comments: No arthritis, no gout  Lymphadenopathy:    He has no cervical adenopathy.  Neurological: He is alert and oriented to person, place, and time. No cranial nerve deficit.  No dysarthria, no aphasia  Skin: Skin is warm and dry. No rash noted. No erythema.  Psychiatric: He has a normal mood and affect. His behavior is normal. Judgment and thought content normal.    LABORATORY PANEL:   CBC Recent  Labs  Lab 01/06/19 1922  WBC 7.6  HGB 13.8  HCT 41.3  PLT 171   ------------------------------------------------------------------------------------------------------------------  Chemistries  Recent Labs  Lab 01/06/19 1922  NA 132*  K 4.3  CL 99  CO2 24  GLUCOSE 104*  BUN 14  CREATININE 1.06  CALCIUM 9.0  MG 1.8  AST 44*  ALT 41  ALKPHOS 115  BILITOT 1.6*   ------------------------------------------------------------------------------------------------------------------  Cardiac Enzymes No results for input(s): TROPONINI in the last 168 hours. ------------------------------------------------------------------------------------------------------------------  RADIOLOGY:  Dg Chest 2 View  Result Date: 01/06/2019 CLINICAL DATA:  Shortness of breath. EXAM: CHEST - 2 VIEW COMPARISON:  Radiograph 12/28/2018 FINDINGS: Post median sternotomy with prosthetic valve. Similar cardiomegaly and mediastinal contours. Worsening pleuroparenchymal opacity at the right lung base with increasing pleural effusion. Probable fluid in the minor fissure. There is vascular congestion without overt pulmonary edema. No pneumothorax. Unchanged retrocardiac atelectasis.  IMPRESSION: 1. Increasing right pleuroparenchymal opacity and pleural effusion over the past 2 days. 2. Cardiomegaly with vascular congestion. Electronically Signed   By: Narda RutherfordMelanie  Sanford M.D.   On: 01/06/2019 20:04    EKG:   Orders placed or performed during the hospital encounter of 01/06/19  . ED EKG  . ED EKG  . EKG 12-Lead  . EKG 12-Lead    IMPRESSION AND PLAN:  Principal Problem:   Pleural effusion -suspect he has some amount of fluid buildup due to HF, potentially related to his valvular abnormalities.  We have given him a dose of IV Lasix tonight.  Can repeat this dose if his blood pressure tolerates and he needs it.  Cardiology consult Active Problems:   Mitral regurgitation -echocardiogram, cardiology consult    Pulmonary hypertension (HCC) -see work-up above   COPD (chronic obstructive pulmonary disease) (HCC) -inhalers PRN   Bipolar 1 disorder (HCC) -continue home meds  Chart review performed and case discussed with ED provider. Labs, imaging and/or ECG reviewed by provider and discussed with patient/family. Management plans discussed with the patient and/or family.  COVID-19 status: Tested negative     DVT PROPHYLAXIS: SubQ lovenox   GI PROPHYLAXIS:  None  ADMISSION STATUS: Inpatient     CODE STATUS: Full Code Status History    Date Active Date Inactive Code Status Order ID Comments User Context   08/07/2011 1543 08/08/2011 1513 Full Code [id number]  Glynn Octaveancour, Stephen, MD ED   Advance Care Planning Activity      TOTAL TIME TAKING CARE OF THIS PATIENT: 45 minutes.   This patient was evaluated in the context of the global COVID-19 pandemic, which necessitated consideration that the patient might be at risk for infection with the SARS-CoV-2 virus that causes COVID-19. Institutional protocols and algorithms that pertain to the evaluation of patients at risk for COVID-19 are in a state of rapid change based on information released by regulatory bodies including the CDC and federal and state organizations. These policies and algorithms were followed to the best of this provider's knowledge to date during the patient's care at this facility.  Barney DrainDavid F Maryetta Shafer 01/06/2019, 10:57 PM  [facility]  Office  [phone]  CC: Primary care physician; [employer]  Note:  This document was prepared using Dragon voice recognition software and may include unintentional dictation errors.

## 2019-01-06 NOTE — ED Triage Notes (Signed)
Pt in with co tightness in throat and "head feels full" at times. STates has had a cough recently that is non productive. STates gets more shob when he lays down, was seen here last week for the same and dx with bronchitis. No shob at this time, pt able to talk in complete sentences, here for persistent symptoms. Was tested for covid, unsure of results. Was given antibiotics and steroids.

## 2019-01-06 NOTE — ED Notes (Signed)
Patient is resting comfortably. 

## 2019-01-07 ENCOUNTER — Inpatient Hospital Stay
Admit: 2019-01-07 | Discharge: 2019-01-07 | Disposition: A | Discharge status: Home / Self Care | Payer: Medicaid Other | Attending: Internal Medicine | Admitting: Internal Medicine

## 2019-01-07 DIAGNOSIS — I5043 Acute on chronic combined systolic (congestive) and diastolic (congestive) heart failure: Secondary | ICD-10-CM | POA: Diagnosis present

## 2019-01-07 DIAGNOSIS — F17210 Nicotine dependence, cigarettes, uncomplicated: Secondary | ICD-10-CM | POA: Diagnosis present

## 2019-01-07 DIAGNOSIS — F1010 Alcohol abuse, uncomplicated: Secondary | ICD-10-CM | POA: Diagnosis present

## 2019-01-07 DIAGNOSIS — Z23 Encounter for immunization: Secondary | ICD-10-CM | POA: Diagnosis not present

## 2019-01-07 DIAGNOSIS — F319 Bipolar disorder, unspecified: Secondary | ICD-10-CM | POA: Diagnosis present

## 2019-01-07 DIAGNOSIS — J449 Chronic obstructive pulmonary disease, unspecified: Secondary | ICD-10-CM | POA: Diagnosis present

## 2019-01-07 DIAGNOSIS — Z20828 Contact with and (suspected) exposure to other viral communicable diseases: Secondary | ICD-10-CM | POA: Diagnosis present

## 2019-01-07 DIAGNOSIS — I110 Hypertensive heart disease with heart failure: Secondary | ICD-10-CM | POA: Diagnosis present

## 2019-01-07 DIAGNOSIS — E119 Type 2 diabetes mellitus without complications: Secondary | ICD-10-CM | POA: Diagnosis present

## 2019-01-07 DIAGNOSIS — Z952 Presence of prosthetic heart valve: Secondary | ICD-10-CM | POA: Diagnosis not present

## 2019-01-07 DIAGNOSIS — Z79899 Other long term (current) drug therapy: Secondary | ICD-10-CM | POA: Diagnosis not present

## 2019-01-07 DIAGNOSIS — I340 Nonrheumatic mitral (valve) insufficiency: Secondary | ICD-10-CM | POA: Diagnosis present

## 2019-01-07 DIAGNOSIS — I272 Pulmonary hypertension, unspecified: Secondary | ICD-10-CM | POA: Diagnosis present

## 2019-01-07 DIAGNOSIS — Z7982 Long term (current) use of aspirin: Secondary | ICD-10-CM | POA: Diagnosis not present

## 2019-01-07 DIAGNOSIS — Z791 Long term (current) use of non-steroidal anti-inflammatories (NSAID): Secondary | ICD-10-CM | POA: Diagnosis not present

## 2019-01-07 DIAGNOSIS — R0602 Shortness of breath: Secondary | ICD-10-CM | POA: Diagnosis present

## 2019-01-07 LAB — SARS CORONAVIRUS 2 BY RT PCR (HOSPITAL ORDER, PERFORMED IN ~~LOC~~ HOSPITAL LAB): SARS Coronavirus 2: NEGATIVE

## 2019-01-07 LAB — BASIC METABOLIC PANEL
Anion gap: 7 (ref 5–15)
BUN: 15 mg/dL (ref 6–20)
CO2: 25 mmol/L (ref 22–32)
Calcium: 8.7 mg/dL — ABNORMAL LOW (ref 8.9–10.3)
Chloride: 103 mmol/L (ref 98–111)
Creatinine, Ser: 1 mg/dL (ref 0.61–1.24)
GFR calc Af Amer: >60 mL/min (ref >60)
GFR calc non Af Amer: >60 mL/min (ref >60)
Glucose, Bld: 139 mg/dL — ABNORMAL HIGH (ref 70–99)
Potassium: 3.9 mmol/L (ref 3.5–5.1)
Sodium: 135 mmol/L (ref 135–145)

## 2019-01-07 LAB — ECHOCARDIOGRAM COMPLETE
Height: 68 in
Weight: 2400 oz

## 2019-01-07 LAB — CBC
HCT: 40.5 % (ref 39.0–52.0)
Hemoglobin: 13.5 g/dL (ref 13.0–17.0)
MCH: 27.7 pg (ref 26.0–34.0)
MCHC: 33.3 g/dL (ref 30.0–36.0)
MCV: 83.2 fL (ref 80.0–100.0)
Platelets: 181 10*3/uL (ref 150–400)
RBC: 4.87 MIL/uL (ref 4.22–5.81)
RDW: 14.2 % (ref 11.5–15.5)
WBC: 6.3 10*3/uL (ref 4.0–10.5)
nRBC: 0 % (ref 0.0–0.2)

## 2019-01-07 LAB — TROPONIN I (HIGH SENSITIVITY): Troponin I (High Sensitivity): 18 ng/L — ABNORMAL HIGH (ref <18)

## 2019-01-07 LAB — HIV ANTIBODY (ROUTINE TESTING W REFLEX): HIV Screen 4th Generation wRfx: NONREACTIVE

## 2019-01-07 MED ORDER — ACETAMINOPHEN 325 MG PO TABS: 650.0000 mg | ORAL_TABLET | Freq: Four times a day (QID) | ORAL | Status: DC | PRN

## 2019-01-07 MED ORDER — ASPIRIN EC 325 MG PO TBEC
325.0000 mg | DELAYED_RELEASE_TABLET | Freq: Every day | ORAL | Status: DC
  Administered 2019-01-07 – 2019-01-08 (×2): 325 mg via ORAL
  Filled 2019-01-07 (×2): qty 1

## 2019-01-07 MED ORDER — ONDANSETRON HCL 4 MG PO TABS: 4.0000 mg | ORAL_TABLET | Freq: Four times a day (QID) | ORAL | Status: DC | PRN

## 2019-01-07 MED ORDER — ACETAMINOPHEN 650 MG RE SUPP: 650.0000 mg | Freq: Four times a day (QID) | RECTAL | Status: DC | PRN

## 2019-01-07 MED ORDER — SODIUM CHLORIDE 0.9% FLUSH
3.0000 mL | Freq: Two times a day (BID) | INTRAVENOUS | Status: DC
  Administered 2019-01-07 – 2019-01-08 (×2): 3 mL via INTRAVENOUS

## 2019-01-07 MED ORDER — ONDANSETRON HCL 4 MG/2ML IJ SOLN: 4.0000 mg | Freq: Four times a day (QID) | INTRAMUSCULAR | Status: DC | PRN

## 2019-01-07 MED ORDER — INFLUENZA VAC SPLIT QUAD 0.5 ML IM SUSY
0.5000 mL | PREFILLED_SYRINGE | INTRAMUSCULAR | Status: AC
  Administered 2019-01-08: 0.5 mL via INTRAMUSCULAR
  Filled 2019-01-07: qty 0.5

## 2019-01-07 MED ORDER — DULOXETINE HCL 30 MG PO CPEP
30.0000 mg | ORAL_CAPSULE | Freq: Every day | ORAL | Status: DC
  Administered 2019-01-07 – 2019-01-08 (×2): 30 mg via ORAL
  Filled 2019-01-07 (×2): qty 1

## 2019-01-07 MED ORDER — ENOXAPARIN SODIUM 40 MG/0.4ML ~~LOC~~ SOLN
40.0000 mg | SUBCUTANEOUS | Status: DC
  Administered 2019-01-07 – 2019-01-08 (×2): 40 mg via SUBCUTANEOUS
  Filled 2019-01-07 (×2): qty 0.4

## 2019-01-07 MED ORDER — MELOXICAM 7.5 MG PO TABS
15.0000 mg | ORAL_TABLET | Freq: Every day | ORAL | Status: DC
  Administered 2019-01-07 – 2019-01-08 (×2): 15 mg via ORAL
  Filled 2019-01-07 (×2): qty 2

## 2019-01-07 MED ORDER — ATORVASTATIN CALCIUM 20 MG PO TABS
40.0000 mg | ORAL_TABLET | Freq: Every day | ORAL | Status: DC
  Administered 2019-01-07: 40 mg via ORAL
  Filled 2019-01-07: qty 2

## 2019-01-07 MED ORDER — FUROSEMIDE 10 MG/ML IJ SOLN
20.0000 mg | Freq: Two times a day (BID) | INTRAMUSCULAR | Status: DC
  Administered 2019-01-07 – 2019-01-08 (×3): 20 mg via INTRAVENOUS
  Filled 2019-01-07 (×3): qty 2

## 2019-01-07 NOTE — Progress Notes (Signed)
Ch visited with pt to complete AD education. Pt was having an ECO completed.  F/U later    01/07/19 1200  Clinical Encounter Type  Visited With Patient;Health care provider  Visit Type  (AD education )  Referral From Physician  Consult/Referral To Chaplain  Recommendations f/u with AD education

## 2019-01-07 NOTE — ED Notes (Signed)
Pt is laying on his right side.

## 2019-01-07 NOTE — Progress Notes (Addendum)
Pt was resting when Ch f/u.  Check to determine further needs at a later time.  Ch f/u with pt and completed education and GOC. Pt desires to have his sister [name] be his HPOA. Ch explained the limitations of finalizing the AD. Pt is hopeful to get clearance for a hip replacement and is currently weaning off nicotine. Pt did not c/o being in pain but shared he has been dealing with arthritis for awhile. Pt has the support of his mother and sister and shared about losing 2 siblings and a father over the last 6 years. Ch provided space for pt to lament about his losses and also his recently health changes. Pt had a positive affect and thankful for the care that he has received.  No further needs at this time.      01/07/19 1300  Clinical Encounter Type  Visited With Patient  Visit Type Follow-up

## 2019-01-07 NOTE — ED Notes (Signed)
Pt denies any further SOB. 1530ml output of urine at this time for a total of 2400 in the ED.

## 2019-01-07 NOTE — ED Notes (Signed)
Pt reports work of breathing has improved. NO distress noted.

## 2019-01-07 NOTE — ED Notes (Signed)
ED TO INPATIENT HANDOFF REPORT  ED Nurse Name and Phone #:  Toma CopierSherie [phone]  S Name/Age/Gender Mike MilletMichael L Dawson 58 y.o. male Room/Bed: ED07A/ED07A  Code Status   Code Status: Prior  Home/SNF/Other Home Patient oriented to: self, place, time and situation Is this baseline? Yes   Triage Complete: Triage complete  Chief Complaint Shob   Triage Note Pt in with co tightness in throat and "head feels full" at times. STates has had a cough recently that is non productive. STates gets more shob when he lays down, was seen here last week for the same and dx with bronchitis. No shob at this time, pt able to talk in complete sentences, here for persistent symptoms. Was tested for covid, unsure of results. Was given antibiotics and steroids.    Allergies No Known Allergies  Level of Care/Admitting Diagnosis ED Disposition    ED Disposition Condition Comment   Admit  [facility]ospital [facility] [[id number]]  Level of Care: Telemetry [5]  Covid Evaluation: Confirmed COVID Negative  Diagnosis: Pleural effusion [242230]  Admitting Physician: Oralia ManisWILLIS, DAVID [[id number]]  Attending Physician: Oralia ManisWILLIS, DAVID [phone][[id number]]  Estimated length of stay: past midnight tomorrow  Certification:: I certify this patient will need inpatient services for at least 2 midnights  Bed request comments: 2a  PT Class (Do Not Modify): Inpatient [101]  PT Acc Code (Do Not Modify): Private [1]       B Medical/Surgery History Past Medical History:  Diagnosis Date  . Alcohol abuse   . Bipolar 1 disorder (HCC)   . COPD (chronic obstructive pulmonary disease) (HCC)   . Hip pain    Past Surgical History:  Procedure Laterality Date  . AORTIC VALVE REPLACEMENT       A IV Location/Drains/Wounds Patient Lines/Drains/Airways Status   Active Line/Drains/Airways    Name:   Placement date:   Placement time:   Site:   Days:   Peripheral IV 01/06/19 Left;Upper Forearm   01/06/19    2159    Forearm   1           Intake/Output Last 24 hours  Intake/Output Summary (Last 24 hours) at 01/07/2019 0143 Last data filed at 01/06/2019 2321 Gross per 24 hour  Intake -  Output 900 ml  Net -900 ml    Labs/Imaging Results for orders placed or performed during the hospital encounter of 01/06/19 (from the past 48 hour(s))  CBC     Status: None   Collection Time: 01/06/19  7:22 PM  Result Value Ref Range   WBC 7.6 4.0 - 10.5 K/uL   RBC 4.92 4.22 - 5.81 MIL/uL   Hemoglobin 13.8 13.0 - 17.0 g/dL   HCT 62.141.3 30.839.0 - 65.752.0 %   MCV 83.9 80.0 - 100.0 fL   MCH 28.0 26.0 - 34.0 pg   MCHC 33.4 30.0 - 36.0 g/dL   RDW 84.614.5 96.211.5 - 95.215.5 %   Platelets 171 150 - 400 K/uL   nRBC 0.0 0.0 - 0.2 %    Comment: Performed at [facility], [address] [place], [place] [address]  Comprehensive metabolic panel     Status: Abnormal   Collection Time: 01/06/19  7:22 PM  Result Value Ref Range   Sodium 132 (L) 135 - 145 mmol/L   Potassium 4.3 3.5 - 5.1 mmol/L   Chloride 99 98 - 111 mmol/L   CO2 24 22 - 32 mmol/L   Glucose, Bld 104 (H) 70 - 99 mg/dL  BUN 14 6 - 20 mg/dL   Creatinine, Ser 0.17 0.61 - 1.24 mg/dL   Calcium 9.0 8.9 - 51.0 mg/dL   Total Protein 6.8 6.5 - 8.1 g/dL   Albumin 3.6 3.5 - 5.0 g/dL   AST 44 (H) 15 - 41 U/L   ALT 41 0 - 44 U/L   Alkaline Phosphatase 115 38 - 126 U/L   Total Bilirubin 1.6 (H) 0.3 - 1.2 mg/dL   GFR calc non Af Amer >60 >60 mL/min   GFR calc Af Amer >60 >60 mL/min   Anion gap 9 5 - 15    Comment: Performed at [facility], [address] [place], [place] [address]  Troponin I (High Sensitivity)     Status: Abnormal   Collection Time: 01/06/19  7:22 PM  Result Value Ref Range   Troponin I (High Sensitivity) 19 (H) <18 ng/L    Comment: (NOTE) Elevated high sensitivity troponin I (hsTnI) values and significant  changes across serial measurements may suggest ACS but many other  chronic and acute conditions are known to elevate hsTnI results.  Refer to  the "Links" section for chest pain algorithms and additional  guidance. Performed at [facility], [address] [place], [place] [address]   Brain natriuretic peptide     Status: Abnormal   Collection Time: 01/06/19  7:22 PM  Result Value Ref Range   B Natriuretic Peptide 1,112.0 (H) 0.0 - 100.0 pg/mL    Comment: Performed at [facility], [address] [place], [place] [address]  Magnesium     Status: None   Collection Time: 01/06/19  7:22 PM  Result Value Ref Range   Magnesium 1.8 1.7 - 2.4 mg/dL    Comment: Performed at [facility], [address] [place], [place] [address]  SARS Coronavirus 2 [facility] order, Performed in [facility] hospital lab) Nasopharyngeal Nasopharyngeal Swab     Status: None   Collection Time: 01/06/19 10:18 PM   Specimen: Nasopharyngeal Swab  Result Value Ref Range   SARS Coronavirus 2 NEGATIVE NEGATIVE    Comment: (NOTE) If result is NEGATIVE SARS-CoV-2 target nucleic acids are NOT DETECTED. The SARS-CoV-2 RNA is generally detectable in upper and lower  respiratory specimens during the acute phase of infection. The lowest  concentration of SARS-CoV-2 viral copies this assay can detect is 250  copies / mL. A negative result does not preclude SARS-CoV-2 infection  and should not be used as the sole basis for treatment or other  patient management decisions.  A negative result may occur with  improper specimen collection / handling, submission of specimen other  than nasopharyngeal swab, presence of viral mutation(s) within the  areas targeted by this assay, and inadequate number of viral copies  (<250 copies / mL). A negative result must be combined with clinical  observations, patient history, and epidemiological information. If result is POSITIVE SARS-CoV-2 target nucleic acids are DETECTED. The SARS-CoV-2 RNA is generally detectable in upper and lower  respiratory specimens dur ing the acute phase of infection.   Positive  results are indicative of active infection with SARS-CoV-2.  Clinical  correlation with patient history and other diagnostic information is  necessary to determine patient infection status.  Positive results do  not rule out bacterial infection or co-infection with other viruses. If result is PRESUMPTIVE POSTIVE SARS-CoV-2 nucleic acids MAY BE PRESENT.   A presumptive positive result was obtained on the submitted specimen  and confirmed on repeat testing.  While 2019 novel coronavirus  (SARS-CoV-2) nucleic acids may be present in the submitted sample  additional confirmatory testing may be necessary for epidemiological  and / or clinical management purposes  to differentiate between  SARS-CoV-2 and other Sarbecovirus currently known to infect humans.  If clinically indicated additional testing with an alternate test  methodology [phone]) is advised. The SARS-CoV-2 RNA is generally  detectable in upper and lower respiratory sp ecimens during the acute  phase of infection. The expected result is Negative. Fact Sheet for Patients:  StrictlyIdeas.no Fact Sheet for Healthcare Providers: BankingDealers.co.za This test is not yet approved or cleared by the [place] FDA and has been authorized for detection and/or diagnosis of SARS-CoV-2 by FDA under an Emergency Use Authorization (EUA).  This EUA will remain in effect (meaning this test can be used) for the duration of the COVID-19 declaration under Section 564(b)(1) of the Act, 21 U.S.C. section 360bbb-3(b)(1), unless the authorization is terminated or revoked sooner. Performed at [facility], [address], [place], [place] [address]    Dg Chest 2 View  Result Date: 01/06/2019 CLINICAL DATA:  Shortness of breath. EXAM: CHEST - 2 VIEW COMPARISON:  Radiograph 12/28/2018 FINDINGS: Post median sternotomy with prosthetic valve. Similar cardiomegaly and mediastinal  contours. Worsening pleuroparenchymal opacity at the right lung base with increasing pleural effusion. Probable fluid in the minor fissure. There is vascular congestion without overt pulmonary edema. No pneumothorax. Unchanged retrocardiac atelectasis. IMPRESSION: 1. Increasing right pleuroparenchymal opacity and pleural effusion over the past 2 days. 2. Cardiomegaly with vascular congestion. Electronically Signed   By: Keith Rake M.D.   On: 01/06/2019 20:04    Pending Labs Unresulted Labs (From admission, onward)    Start     Ordered   Signed and Held  HIV Antibody (routine testing w rflx)  (HIV Antibody (Routine testing w reflex) panel)  Once,   R     Signed and Held   Signed and Held  HIV4GL Save Tube  (HIV Antibody (Routine testing w reflex) panel)  Once,   R     Signed and Held   Signed and Held  CBC  (enoxaparin (LOVENOX)    CrCl >/= 30 ml/min)  Once,   R    Comments: Baseline for enoxaparin therapy IF NOT ALREADY DRAWN.  Notify MD if PLT < 100 K.    Signed and Held   Signed and Held  Creatinine, serum  (enoxaparin (LOVENOX)    CrCl >/= 30 ml/min)  Once,   R    Comments: Baseline for enoxaparin therapy IF NOT ALREADY DRAWN.    Signed and Held   Signed and Held  Creatinine, serum  (enoxaparin (LOVENOX)    CrCl >/= 30 ml/min)  Weekly,   R    Comments: while on enoxaparin therapy    Signed and Held   Signed and Held  Basic metabolic panel  Tomorrow morning,   R     Signed and Held   Signed and Held  CBC  Tomorrow morning,   R     Signed and Held          Vitals/Pain Today's Vitals   01/06/19 2230 01/06/19 2300 01/06/19 2330 01/07/19 0030  BP: 138/85 128/76 122/73 (!) 96/54  Pulse: 91 84 87 86  Resp:   (!) 25 14  Temp:      TempSrc:      SpO2: 98% 98% 98% 97%  Weight:      Height:  PainSc:        Isolation Precautions No active isolations  Medications Medications  mirtazapine (REMERON) tablet 15 mg (15 mg Oral Given 01/06/19 2318)  gabapentin (NEURONTIN)  capsule 300 mg (300 mg Oral Given 01/06/19 2318)  nicotine (NICODERM CQ - dosed in mg/24 hr) patch 7 mg (7 mg Transdermal Patch Applied 01/06/19 2314)  albuterol (VENTOLIN HFA) 108 (90 Base) MCG/ACT inhaler 2 puff (2 puffs Inhalation Given 01/06/19 2017)  furosemide (LASIX) injection 40 mg (40 mg Intravenous Given 01/06/19 2200)    Mobility walks Low fall risk   Focused Assessments n/a   R Recommendations: See Admitting Provider Note  Report given to:   Additional Notes: n/a

## 2019-01-07 NOTE — Consult Note (Signed)
Reason for Consult: Shortness of breath COPD valvular heart disease Referring Physician: [facility] cardiology Cardiology [facility]  [name] is an 58 y.o. male.  HPI: Patient is a 58 year old black male history of multiple medical problems including shortness of breath choking feeling smoking bipolar alcohol abuse COPD hypertension valvular replacement and repair diabetes states that he presented because of shortness of breath dyspnea choking sensation.  Patient developed orthopnea as well denies any leg swelling he has had significant aortic valve and mitral valve procedures in the past at [facility].  Patient had vascular congestion and evidence of right pleural effusion BNP was elevated patient denies significant chest pain but now here for further evaluation  Past Medical History:  Diagnosis Date  . Alcohol abuse   . Bipolar 1 disorder ([place])   . COPD (chronic obstructive pulmonary disease) ([place])   . Hip pain     Past Surgical History:  Procedure Laterality Date  . AORTIC VALVE REPLACEMENT      History reviewed. No pertinent family history.  Social History:  reports that he has been smoking cigarettes. He has been smoking about 1.00 pack per day. He has never used smokeless tobacco. He reports current alcohol use. He reports current drug use. Drug: Cocaine.  Allergies: No Known Allergies  Medications: I have reviewed the patient's current medications.  Results for orders placed or performed during the hospital encounter of 01/06/19 (from the past 48 hour(s))  CBC     Status: None   Collection Time: 01/06/19  7:22 PM  Result Value Ref Range   WBC 7.6 4.0 - 10.5 K/uL   RBC 4.92 4.22 - 5.81 MIL/uL   Hemoglobin 13.8 13.0 - 17.0 g/dL   HCT 41.3 39.0 - 52.0 %   MCV 83.9 80.0 - 100.0 fL   MCH 28.0 26.0 - 34.0 pg   MCHC 33.4 30.0 - 36.0 g/dL   RDW 14.5 11.5 - 15.5 %   Platelets 171 150 - 400 K/uL   nRBC 0.0 0.0 - 0.2 %    Comment: Performed at [facility], [address] [place], [place] [address]  Comprehensive metabolic panel     Status: Abnormal   Collection Time: 01/06/19  7:22 PM  Result Value Ref Range   Sodium 132 (L) 135 - 145 mmol/L   Potassium 4.3 3.5 - 5.1 mmol/L   Chloride 99 98 - 111 mmol/L   CO2 24 22 - 32 mmol/L   Glucose, Bld 104 (H) 70 - 99 mg/dL   BUN 14 6 - 20 mg/dL   Creatinine, Ser 1.06 0.61 - 1.24 mg/dL   Calcium 9.0 8.9 - 10.3 mg/dL   Total Protein 6.8 6.5 - 8.1 g/dL   Albumin 3.6 3.5 - 5.0 g/dL   AST 44 (H) 15 - 41 U/L   ALT 41 0 - 44 U/L   Alkaline Phosphatase 115 38 - 126 U/L   Total Bilirubin 1.6 (H) 0.3 - 1.2 mg/dL   GFR calc non Af Amer >60 >60 mL/min   GFR calc Af Amer >60 >60 mL/min   Anion gap 9 5 - 15    Comment: Performed at [facility], [address] [place], [place] [address]  Troponin I (High Sensitivity)     Status: Abnormal   Collection Time: 01/06/19  7:22 PM  Result Value Ref Range   Troponin I (High Sensitivity) 19 (H) <18 ng/L    Comment: (NOTE) Elevated high sensitivity troponin I (hsTnI) values and  significant  changes across serial measurements may suggest ACS but many other  chronic and acute conditions are known to elevate hsTnI results.  Refer to the "Links" section for chest pain algorithms and additional  guidance. Performed at [facility], [address] [place], [place] [address]   Brain natriuretic peptide     Status: Abnormal   Collection Time: 01/06/19  7:22 PM  Result Value Ref Range   B Natriuretic Peptide 1,112.0 (H) 0.0 - 100.0 pg/mL    Comment: Performed at [facility], [address] [place], [place] [address]  Magnesium     Status: None   Collection Time: 01/06/19  7:22 PM  Result Value Ref Range   Magnesium 1.8 1.7 - 2.4 mg/dL    Comment: Performed at [facility], [address] [place], [place] [address]  SARS Coronavirus 2 [facility](Hospital order, Performed in [facility] hospital lab) Nasopharyngeal Nasopharyngeal Swab      Status: None   Collection Time: 01/06/19 10:18 PM   Specimen: Nasopharyngeal Swab  Result Value Ref Range   SARS Coronavirus 2 NEGATIVE NEGATIVE    Comment: (NOTE) If result is NEGATIVE SARS-CoV-2 target nucleic acids are NOT DETECTED. The SARS-CoV-2 RNA is generally detectable in upper and lower  respiratory specimens during the acute phase of infection. The lowest  concentration of SARS-CoV-2 viral copies this assay can detect is 250  copies / mL. A negative result does not preclude SARS-CoV-2 infection  and should not be used as the sole basis for treatment or other  patient management decisions.  A negative result may occur with  improper specimen collection / handling, submission of specimen other  than nasopharyngeal swab, presence of viral mutation(s) within the  areas targeted by this assay, and inadequate number of viral copies  (<250 copies / mL). A negative result must be combined with clinical  observations, patient history, and epidemiological information. If result is POSITIVE SARS-CoV-2 target nucleic acids are DETECTED. The SARS-CoV-2 RNA is generally detectable in upper and lower  respiratory specimens dur ing the acute phase of infection.  Positive  results are indicative of active infection with SARS-CoV-2.  Clinical  correlation with patient history and other diagnostic information is  necessary to determine patient infection status.  Positive results do  not rule out bacterial infection or co-infection with other viruses. If result is PRESUMPTIVE POSTIVE SARS-CoV-2 nucleic acids MAY BE PRESENT.   A presumptive positive result was obtained on the submitted specimen  and confirmed on repeat testing.  While 2019 novel coronavirus  (SARS-CoV-2) nucleic acids may be present in the submitted sample  additional confirmatory testing may be necessary for epidemiological  and / or clinical management purposes  to differentiate between  SARS-CoV-2 and other Sarbecovirus  currently known to infect humans.  If clinically indicated additional testing with an alternate test  methodology [phone]([id number]) is advised. The SARS-CoV-2 RNA is generally  detectable in upper and lower respiratory sp ecimens during the acute  phase of infection. The expected result is Negative. Fact Sheet for Patients:  BoilerBrush.com.cyhttps://www.fda.gov/media/136312/download Fact Sheet for Healthcare Providers: https://pope.com/https://www.fda.gov/media/136313/download This test is not yet approved or cleared by the [place] FDA and has been authorized for detection and/or diagnosis of SARS-CoV-2 by FDA under an Emergency Use Authorization (EUA).  This EUA will remain in effect (meaning this test can be used) for the duration of the COVID-19 declaration under Section 564(b)(1) of the Act, 21 U.S.C. section 360bbb-3(b)(1), unless the authorization is terminated or  revoked sooner. Performed at [facility], [address] [place], [place] [address]   Troponin I (High Sensitivity)     Status: Abnormal   Collection Time: 01/07/19  2:44 AM  Result Value Ref Range   Troponin I (High Sensitivity) 18 (H) <18 ng/L    Comment: (NOTE) Elevated high sensitivity troponin I (hsTnI) values and significant  changes across serial measurements may suggest ACS but many other  chronic and acute conditions are known to elevate hsTnI results.  Refer to the "Links" section for chest pain algorithms and additional  guidance. Performed at [facility], [address] [place], [place] [address]   HIV Antibody (routine testing w rflx)     Status: None   Collection Time: 01/07/19  2:44 AM  Result Value Ref Range   HIV Screen 4th Generation wRfx NON REACTIVE NON REACTIVE    Comment: Performed at [facility], [address] [place], [place] [address]  Basic metabolic panel     Status: Abnormal   Collection Time: 01/07/19  2:44 AM  Result Value Ref Range   Sodium 135 135 - 145 mmol/L   Potassium 3.9 3.5  - 5.1 mmol/L   Chloride 103 98 - 111 mmol/L   CO2 25 22 - 32 mmol/L   Glucose, Bld 139 (H) 70 - 99 mg/dL   BUN 15 6 - 20 mg/dL   Creatinine, Ser 1.30 0.61 - 1.24 mg/dL   Calcium 8.7 (L) 8.9 - 10.3 mg/dL   GFR calc non Af Amer >60 >60 mL/min   GFR calc Af Amer >60 >60 mL/min   Anion gap 7 5 - 15    Comment: Performed at [facility], [address] [place], [place] [address]  CBC     Status: None   Collection Time: 01/07/19  2:44 AM  Result Value Ref Range   WBC 6.3 4.0 - 10.5 K/uL   RBC 4.87 4.22 - 5.81 MIL/uL   Hemoglobin 13.5 13.0 - 17.0 g/dL   HCT 46.9 62.9 - 52.8 %   MCV 83.2 80.0 - 100.0 fL   MCH 27.7 26.0 - 34.0 pg   MCHC 33.3 30.0 - 36.0 g/dL   RDW 41.3 24.4 - 01.0 %   Platelets 181 150 - 400 K/uL   nRBC 0.0 0.0 - 0.2 %    Comment: Performed at [facility], [address] [place], [place] [address]    Dg Chest 2 View  Result Date: 01/06/2019 CLINICAL DATA:  Shortness of breath. EXAM: CHEST - 2 VIEW COMPARISON:  Radiograph 12/28/2018 FINDINGS: Post median sternotomy with prosthetic valve. Similar cardiomegaly and mediastinal contours. Worsening pleuroparenchymal opacity at the right lung base with increasing pleural effusion. Probable fluid in the minor fissure. There is vascular congestion without overt pulmonary edema. No pneumothorax. Unchanged retrocardiac atelectasis. IMPRESSION: 1. Increasing right pleuroparenchymal opacity and pleural effusion over the past 2 days. 2. Cardiomegaly with vascular congestion. Electronically Signed   By: Narda Rutherford M.D.   On: 01/06/2019 20:04    Review of Systems  Constitutional: Positive for diaphoresis and malaise/fatigue.  HENT: Positive for congestion.   Eyes: Negative.   Respiratory: Positive for shortness of breath.   Cardiovascular: Positive for chest pain, orthopnea and PND.  Gastrointestinal: Negative.   Genitourinary: Negative.   Musculoskeletal: Negative.   Skin: Negative.   Neurological:  Negative.   Endo/Heme/Allergies: Negative.   Psychiatric/Behavioral: Negative.    Blood pressure 127/78, pulse 95, temperature 99 F (37.2  C), temperature source Oral, resp. rate 19, height 5\' 8"  (1.727 m), weight 68 kg, SpO2 100 %. Physical Exam  Nursing note and vitals reviewed. Constitutional: He is oriented to person, place, and time. He appears well-developed and well-nourished.  HENT:  Head: Normocephalic and atraumatic.  Eyes: Pupils are equal, round, and reactive to light. Conjunctivae and EOM are normal.  Neck: Normal range of motion. Neck supple.  Cardiovascular: Normal rate and regular rhythm.  Murmur heard. Respiratory: Effort normal and breath sounds normal.  GI: Soft. Bowel sounds are normal.  Musculoskeletal: Normal range of motion.  Neurological: He is alert and oriented to person, place, and time. He has normal reflexes.  Skin: Skin is warm and dry.  Psychiatric: He has a normal mood and affect.    Assessment/Plan: SOB Smoking Bipolar ETOH abuse COPD HTN DM AoVR x 2   MV repair Hx Endocarditis . Plan Continue submental oxygen as necessary for shortness of breath Smoking advised patient refrain from tobacco abuse Consider inhalers for COPD shortness of breath Rule out myocardial infarction follow-up EKGs troponins Continue hypertension management and control Maintain diabetes management control History of valvular replacement and repair secondary endocarditis consider echocardiogram for assessment of valvular structures Advised patient refrain from alcohol at least Continue bipolar type medications  Latacha Texeira D Arshawn Valdez 01/07/2019, 1:47 PM

## 2019-01-07 NOTE — ED Notes (Signed)
Patient is resting comfortably. 

## 2019-01-07 NOTE — Progress Notes (Signed)
*  PRELIMINARY RESULTS* Echocardiogram 2D Echocardiogram has been performed.  Sherrie Sport 01/07/2019, 10:23 AM

## 2019-01-07 NOTE — ED Notes (Signed)
Pt resting quietly.

## 2019-01-07 NOTE — Progress Notes (Signed)
[facility] [place] NAME: [name]    MR#:  [id number]  DATE OF BIRTH:  [birth year]  SUBJECTIVE:  CHIEF COMPLAINT:   Chief Complaint  Patient presents with  . Shortness of Breath   Came with SOB, have right-sided pleural effusion. REVIEW OF SYSTEMS:  CONSTITUTIONAL: No fever, fatigue or weakness.  EYES: No blurred or double vision.  EARS, NOSE, AND THROAT: No tinnitus or ear pain.  RESPIRATORY: No cough, have shortness of breath, no wheezing or hemoptysis.  CARDIOVASCULAR: No chest pain, orthopnea, edema.  GASTROINTESTINAL: No nausea, vomiting, diarrhea or abdominal pain.  GENITOURINARY: No dysuria, hematuria.  ENDOCRINE: No polyuria, nocturia,  HEMATOLOGY: No anemia, easy bruising or bleeding SKIN: No rash or lesion. MUSCULOSKELETAL: No joint pain or arthritis.   NEUROLOGIC: No tingling, numbness, weakness.  PSYCHIATRY: No anxiety or depression.   ROS  DRUG ALLERGIES:  No Known Allergies  VITALS:  Blood pressure 127/78, pulse 95, temperature 99 F (37.2 C), temperature source Oral, resp. rate 19, height 5\' 8"  (1.727 m), weight 68 kg, SpO2 100 %.  PHYSICAL EXAMINATION:  GENERAL:  58 y.o.-year-old patient lying in the bed with no acute distress.  EYES: Pupils equal, round, reactive to light and accommodation. No scleral icterus. Extraocular muscles intact.  HEENT: Head atraumatic, normocephalic. Oropharynx and nasopharynx clear.  NECK:  Supple, no jugular venous distention. No thyroid enlargement, no tenderness.  LUNGS: Decreased breath sound on right side, no wheezing, rales,rhonchi or crepitation. No use of accessory muscles of respiration.  Mid chest surgical scar present. CARDIOVASCULAR: S1, S2 normal. No murmurs, rubs, or gallops.  ABDOMEN: Soft, nontender, nondistended. Bowel sounds present. No organomegaly or mass.  EXTREMITIES: some pedal edema,no cyanosis, or clubbing.  NEUROLOGIC: Cranial nerves II through XII are  intact. Muscle strength 5/5 in all extremities. Sensation intact. Gait not checked.  PSYCHIATRIC: The patient is alert and oriented x 3.  SKIN: No obvious rash, lesion, or ulcer.   Physical Exam LABORATORY PANEL:   CBC Recent Labs  Lab 01/07/19 0244  WBC 6.3  HGB 13.5  HCT 40.5  PLT 181   ------------------------------------------------------------------------------------------------------------------  Chemistries  Recent Labs  Lab 01/06/19 1922 01/07/19 0244  NA 132* 135  K 4.3 3.9  CL 99 103  CO2 24 25  GLUCOSE 104* 139*  BUN 14 15  CREATININE 1.06 1.00  CALCIUM 9.0 8.7*  MG 1.8  --   AST 44*  --   ALT 41  --   ALKPHOS 115  --   BILITOT 1.6*  --    ------------------------------------------------------------------------------------------------------------------  Cardiac Enzymes No results for input(s): TROPONINI in the last 168 hours. ------------------------------------------------------------------------------------------------------------------  RADIOLOGY:  Dg Chest 2 View  Result Date: 01/06/2019 CLINICAL DATA:  Shortness of breath. EXAM: CHEST - 2 VIEW COMPARISON:  Radiograph 12/28/2018 FINDINGS: Post median sternotomy with prosthetic valve. Similar cardiomegaly and mediastinal contours. Worsening pleuroparenchymal opacity at the right lung base with increasing pleural effusion. Probable fluid in the minor fissure. There is vascular congestion without overt pulmonary edema. No pneumothorax. Unchanged retrocardiac atelectasis. IMPRESSION: 1. Increasing right pleuroparenchymal opacity and pleural effusion over the past 2 days. 2. Cardiomegaly with vascular congestion. Electronically Signed   By: Keith Rake M.D.   On: 01/06/2019 20:04    ASSESSMENT AND PLAN:   Principal Problem:   Pleural effusion Active Problems:   Mitral regurgitation   Pulmonary hypertension (HCC)   COPD (chronic obstructive pulmonary disease) (HCC)   Bipolar 1 disorder (HCC)    *  Pleural effusion -suspect he has some amount of fluid buildup due to HF, potentially related to his valvular abnormalities.   IV Lasix , Cardiology consult, get Echocardiogram.  *  Mitral regurgitation -echocardiogram, cardiology consult  *  Pulmonary hypertension (HCC) -see work-up above  *  COPD (chronic obstructive pulmonary disease) (HCC) -inhalers PRN  *  Bipolar 1 disorder (HCC) -continue home meds    All the records are reviewed and case discussed with Care Management/Social Workerr. Management plans discussed with the patient, family and they are in agreement.  CODE STATUS: Full code  TOTAL TIME TAKING CARE OF THIS PATIENT: 35 minutes.     POSSIBLE D/C IN 1-2 DAYS, DEPENDING ON CLINICAL CONDITION.   Altamese Dilling M.D on 01/07/2019   Between 7am to 6pm - Pager - [phone]  After 6pm go to [email or web address] - Social research officer, government  [facility]  Office  [phone]  CC: Primary care physician; Medicine, [employer]  Note: This dictation was prepared with Dragon dictation along with smaller phrase technology. Any transcriptional errors that result from this process are unintentional.

## 2019-01-08 ENCOUNTER — Inpatient Hospital Stay: Payer: Medicaid Other

## 2019-01-08 LAB — BASIC METABOLIC PANEL
Anion gap: 9 (ref 5–15)
BUN: 19 mg/dL (ref 6–20)
CO2: 27 mmol/L (ref 22–32)
Calcium: 8.7 mg/dL — ABNORMAL LOW (ref 8.9–10.3)
Chloride: 102 mmol/L (ref 98–111)
Creatinine, Ser: 1.12 mg/dL (ref 0.61–1.24)
GFR calc Af Amer: >60 mL/min (ref >60)
GFR calc non Af Amer: >60 mL/min (ref >60)
Glucose, Bld: 134 mg/dL — ABNORMAL HIGH (ref 70–99)
Potassium: 3.4 mmol/L — ABNORMAL LOW (ref 3.5–5.1)
Sodium: 138 mmol/L (ref 135–145)

## 2019-01-08 MED ORDER — POTASSIUM CHLORIDE CRYS ER 20 MEQ PO TBCR
40.0000 meq | EXTENDED_RELEASE_TABLET | Freq: Once | ORAL | Status: AC
  Administered 2019-01-08: 40 meq via ORAL
  Filled 2019-01-08: qty 2

## 2019-01-08 MED ORDER — FUROSEMIDE 20 MG PO TABS: 20.0000 mg | ORAL_TABLET | Freq: Every day | ORAL | 11 refills | Status: DC

## 2019-01-08 MED ORDER — POLYETHYLENE GLYCOL 3350 17 G PO PACK
17.0000 g | PACK | Freq: Every day | ORAL | Status: DC
  Administered 2019-01-08: 17 g via ORAL
  Filled 2019-01-08: qty 1

## 2019-01-08 MED ORDER — IPRATROPIUM-ALBUTEROL 0.5-2.5 (3) MG/3ML IN SOLN: 3.0000 mL | Freq: Four times a day (QID) | RESPIRATORY_TRACT | Status: DC | PRN

## 2019-01-08 MED ORDER — SENNOSIDES-DOCUSATE SODIUM 8.6-50 MG PO TABS
1.0000 | ORAL_TABLET | Freq: Two times a day (BID) | ORAL | Status: DC
  Administered 2019-01-08: 1 via ORAL
  Filled 2019-01-08: qty 1

## 2019-01-08 NOTE — Discharge Summary (Signed)
[facility] - [place] [facility]   PATIENT NAME: [name]    MR#:  [id number]  DATE OF BIRTH:  10/07/1960  DATE OF ADMISSION:  01/06/2019 ADMITTING PHYSICIAN: Oralia Manis, MD  DATE OF DISCHARGE: 01/08/2019   PRIMARY CARE PHYSICIAN: Medicine, [employer]    ADMISSION DIAGNOSIS:  Orthopnea [R06.01] Pleural effusion [J90] Mitral valve insufficiency, unspecified etiology [I34.0]  DISCHARGE DIAGNOSIS:  Principal Problem:   Pleural effusion Active Problems:   Mitral regurgitation   Pulmonary hypertension (HCC)   COPD (chronic obstructive pulmonary disease) (HCC)   Bipolar 1 disorder (HCC)   SECONDARY DIAGNOSIS:   Past Medical History:  Diagnosis Date  . Alcohol abuse   . Bipolar 1 disorder (HCC)   . COPD (chronic obstructive pulmonary disease) (HCC)   . 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   IV Lasix ,Cardiology consult, Done Echocardiogram- confirmed valvular abnormalities. Patient had good diuresis and feeling much better today. I have counseled him about fluid restriction. Advised him to follow-up with his primary care and cardiologist as outpatient.  *Mitral regurgitation -echocardiogram, cardiology consult appreciated.  *Pulmonary hypertension (HCC) -see work-up above  *COPD (chronic obstructive pulmonary disease) (HCC) -inhalers PRN  *Bipolar 1 disorder (HCC) -continue home meds  DISCHARGE CONDITIONS:   stable.  CONSULTS OBTAINED:  Treatment Team:  Alwyn Pea, MD  DRUG ALLERGIES:  No Known Allergies  DISCHARGE MEDICATIONS:   Allergies as of 01/08/2019   No Known Allergies     Medication List    STOP taking these medications   lisinopril 5 MG tablet Commonly known as: ZESTRIL     TAKE these medications    acetaminophen 500 MG tablet Commonly known as: TYLENOL Take 1,000 mg by mouth every 8 (eight) hours.   albuterol 108 (90 Base) MCG/ACT inhaler Commonly known as: VENTOLIN HFA Inhale 2 puffs into the lungs every 6 (six) hours as needed for wheezing or shortness of breath.   aspirin 325 MG tablet Take 325 mg by mouth daily.   atorvastatin 40 MG tablet Commonly known as: LIPITOR Take 40 mg by mouth daily.   DULoxetine 30 MG capsule Commonly known as: CYMBALTA Take 30 mg by mouth daily.   FLUoxetine 20 MG capsule Commonly known as: PROZAC Take 20 mg by mouth every morning.   furosemide 20 MG tablet Commonly known as: Lasix Take 1 tablet (20 mg total) by mouth daily.   gabapentin 300 MG capsule Commonly known as: NEURONTIN Take 300 mg by mouth 3 (three) times daily.   glipiZIDE 5 MG tablet Commonly known as: GLUCOTROL Take 5 mg by mouth daily before breakfast.   meloxicam 15 MG tablet Commonly known as: MOBIC Take 15 mg by mouth daily.   metoprolol tartrate 25 MG tablet Commonly known as: LOPRESSOR Take 25 mg by mouth daily.   mirtazapine 15 MG tablet Commonly known as: REMERON Take 15 mg by mouth at bedtime.   naproxen 500 MG tablet Commonly known as: NAPROSYN Take 500 mg by mouth 2 (two) times daily with a meal.        DISCHARGE INSTRUCTIONS:   Follow with primary care physician and cardiologist in 1 to 2 weeks.  If you experience worsening of your admission symptoms, develop shortness of breath, life threatening emergency, suicidal or homicidal thoughts you must  seek medical attention immediately by calling 911 or calling your MD immediately  if symptoms less severe.  You Must read complete instructions/literature along with all the possible adverse reactions/side effects for all the Medicines you take and that have been prescribed to you. Take any new Medicines after you have completely understood and accept all the possible adverse reactions/side  effects.   Please note  You were cared for by a hospitalist during your hospital stay. If you have any questions about your discharge medications or the care you received while you were in the hospital after you are discharged, you can call the unit and asked to speak with the hospitalist on call if the hospitalist that took care of you is not available. Once you are discharged, your primary care physician will handle any further medical issues. Please note that NO REFILLS for any discharge medications will be authorized once you are discharged, as it is imperative that you return to your primary care physician (or establish a relationship with a primary care physician if you do not have one) for your aftercare needs so that they can reassess your need for medications and monitor your lab values.    Today   CHIEF COMPLAINT:   Chief Complaint  Patient presents with  . Shortness of Breath    HISTORY OF PRESENT ILLNESS:  [name]  is a 58 y.o. male presents with chief complaint as above.  Patient presents the ED with a complaint of progressive shortness of breath.  He states that he was seen here last week for similar complaint, since that time it is gotten worse.  He is developed orthopnea as well.  He does have a history of aortic and mitral valve procedures.  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  Evaluation here in the ED shows vascular congestion as well as right-sided pleural effusion.  His BNP was also elevated.  Hospitalist called for admission   VITAL SIGNS:  Blood pressure 125/75, pulse 89, temperature 97.7 F (36.5 C), temperature source Oral, resp. rate 18, height  (1.727 m), weight 64 kg, SpO2 98 %.  I/O:    Intake/Output Summary (Last 24 hours) at 01/08/2019 1320 Last data filed at 01/08/2019 1019 Gross per 24 hour  Intake 600 ml  Output 1850 ml  Net -1250 ml    PHYSICAL EXAMINATION:   GENERAL:  58 y.o.-year-old patient lying in the bed with  no acute distress.  EYES: Pupils equal, round, reactive to light and accommodation. No scleral icterus. Extraocular muscles intact.  HEENT: Head atraumatic, normocephalic. Oropharynx and nasopharynx clear.  NECK:  Supple, no jugular venous distention. No thyroid enlargement, no tenderness.  LUNGS: Decreased breath sound on right side, no wheezing, rales,rhonchi or crepitation. No use of accessory muscles of respiration.  Mid chest surgical scar present. CARDIOVASCULAR: S1, S2 normal. No murmurs, rubs, or gallops.  ABDOMEN: Soft, nontender, nondistended. Bowel sounds present. No organomegaly or mass.  EXTREMITIES: some pedal edema,no cyanosis, or clubbing.  NEUROLOGIC: Cranial nerves II through XII are intact. Muscle strength 5/5 in all extremities. Sensation intact. Gait not checked.  PSYCHIATRIC: The patient is alert and oriented x 3.  SKIN: No obvious rash, lesion, or ulcer.   DATA REVIEW:   CBC Recent Labs  Lab 01/07/19 0244  WBC 6.3  HGB 13.5  HCT 40.5  PLT 181    Chemistries  Recent Labs  Lab 01/06/19 1922  01/08/19 0559  NA 132*   < > 138  K 4.3   < > 3.4*  CL 99   < > 102  CO2 24   < > 27  GLUCOSE 104*   < > 134*  BUN 14   < > 19  CREATININE 1.06   < > 1.12  CALCIUM 9.0   < > 8.7*  MG 1.8  --   --   AST 44*  --   --   ALT 41  --   --   ALKPHOS 115  --   --   BILITOT 1.6*  --   --    < > = values in this interval not displayed.    Cardiac Enzymes No results for input(s): TROPONINI in the last 168 hours.  Microbiology Results  Results for orders placed or performed during the hospital encounter of 01/06/19  SARS Coronavirus 2 [facility](Hospital order, [facility]) Nasopharyngeal Nasopharyngeal Swab     Status: None   Collection Time: 01/06/19 10:18 PM   Specimen: Nasopharyngeal Swab  Result Value Ref Range Status   SARS Coronavirus 2 NEGATIVE NEGATIVE Final    Comment: (NOTE) If result is NEGATIVE SARS-CoV-2 target nucleic acids are NOT  DETECTED. The SARS-CoV-2 RNA is generally detectable in upper and lower  respiratory specimens during the acute phase of infection. The lowest  concentration of SARS-CoV-2 viral copies this assay can detect is 250  copies / mL. A negative result does not preclude SARS-CoV-2 infection  and should not be used as the sole basis for treatment or other  patient management decisions.  A negative result may occur with  improper specimen collection / handling, submission of specimen other  than nasopharyngeal swab, presence of viral mutation(s) within the  areas targeted by this assay, and inadequate number of viral copies  (<250 copies / mL). A negative result must be combined with clinical  observations, patient history, and epidemiological information. If result is POSITIVE SARS-CoV-2 target nucleic acids are DETECTED. The SARS-CoV-2 RNA is generally detectable in upper and lower  respiratory specimens dur ing the acute phase of infection.  Positive  results are indicative of active infection with SARS-CoV-2.  Clinical  correlation with patient history and other diagnostic information is  necessary to determine patient infection status.  Positive results do  not rule out bacterial infection or co-infection with other viruses. If result is PRESUMPTIVE POSTIVE SARS-CoV-2 nucleic acids MAY BE PRESENT.   A presumptive positive result was obtained on the submitted specimen  and confirmed on repeat testing.  While 2019 novel coronavirus  (SARS-CoV-2) nucleic acids may be present in the submitted sample  additional confirmatory testing may be necessary for epidemiological  and / or clinical management purposes  to differentiate between  SARS-CoV-2 and other Sarbecovirus currently known to infect humans.  If clinically indicated additional testing with an alternate test  methodology [phone]) is advised. The SARS-CoV-2 RNA is generally  detectable in upper and lower respiratory sp ecimens during  the acute  phase of infection. The expected result is Negative. Fact Sheet for Patients:  BoilerBrush.com.cyhttps://www.fda.gov/media/136312/download Fact Sheet for Healthcare Providers: https://pope.com/https://www.fda.gov/media/136313/download This test is not yet approved or cleared by the [place] FDA and has been authorized for detection and/or diagnosis of SARS-CoV-2 by FDA under an Emergency Use Authorization (EUA).  This EUA will remain in effect (meaning this test can be used) for the duration of the COVID-19 declaration under Section 564(b)(1) of the Act, 21 U.S.C. section 360bbb-3(b)(1), unless the authorization is terminated or revoked  sooner. Performed at [facility], [address] [place], [place] [address]     RADIOLOGY:  Dg Chest 2 View  Result Date: 01/08/2019 CLINICAL DATA:  Pulmonary embolism. Hx of copd, smoker EXAM: CHEST - 2 VIEW COMPARISON:  Chest radiograph 01/06/2019 FINDINGS: Stable cardiomediastinal contours with enlarged heart size. Surgical changes status post valve replacement. Stable pleuroparenchymal opacity at the right lung base with fluid extending into the minor fissure. Vascular congestion without overt edema. No acute finding in the visualized skeleton. IMPRESSION: Stable pleuroparenchymal opacity at the right lung base with fluid extending into the minor fissure. Electronically Signed   By: Audie Pinto M.D.   On: 01/08/2019 08:37   Dg Chest 2 View  Result Date: 01/06/2019 CLINICAL DATA:  Shortness of breath. EXAM: CHEST - 2 VIEW COMPARISON:  Radiograph 12/28/2018 FINDINGS: Post median sternotomy with prosthetic valve. Similar cardiomegaly and mediastinal contours. Worsening pleuroparenchymal opacity at the right lung base with increasing pleural effusion. Probable fluid in the minor fissure. There is vascular congestion without overt pulmonary edema. No pneumothorax. Unchanged retrocardiac atelectasis. IMPRESSION: 1. Increasing right pleuroparenchymal opacity and  pleural effusion over the past 2 days. 2. Cardiomegaly with vascular congestion. Electronically Signed   By: Keith Rake M.D.   On: 01/06/2019 20:04    EKG:   Orders placed or performed during the hospital encounter of 01/06/19  . ED EKG  . ED EKG  . EKG 12-Lead  . EKG 12-Lead     Management plans discussed with the patient, family and they are in agreement.  CODE STATUS: Full.    Code Status Orders  (From admission, onward)         Start     Ordered   01/07/19 0232  Full code  Continuous     01/07/19 0231        Code Status History    Date Active Date Inactive Code Status Order ID Comments User Context   08/07/2011 1543 08/08/2011 1513 Full Code [id number]  Ezequiel Essex, MD ED   Advance Care Planning Activity      TOTAL TIME TAKING CARE OF THIS PATIENT: 35 minutes.    Vaughan Basta M.D on 01/08/2019 at 1:20 PM  Between 7am to 6pm - Pager - [phone]  After 6pm go to [email or web address] - password [email or web address] [facility]  Office  [phone]  CC: Primary care physician; [facility], [facility]   Note: This dictation was prepared with Dragon dictation along with smaller phrase technology. Any transcriptional errors that result from this process are unintentional.

## 2019-01-08 NOTE — Progress Notes (Signed)
Discharged to home with a friend.  His prescription went to [facility] on [address].  He will make a follow up appointment at [facility]

## 2019-01-21 ENCOUNTER — Encounter: Payer: Self-pay | Admitting: Emergency Medicine

## 2019-01-21 ENCOUNTER — Emergency Department
Admission: EM | Admit: 2019-01-21 | Discharge: 2019-01-21 | Disposition: A | Discharge status: Home / Self Care | Payer: Medicaid Other | Attending: Emergency Medicine | Admitting: Emergency Medicine

## 2019-01-21 ENCOUNTER — Emergency Department: Payer: Medicaid Other

## 2019-01-21 ENCOUNTER — Other Ambulatory Visit: Payer: Self-pay

## 2019-01-21 DIAGNOSIS — Z7982 Long term (current) use of aspirin: Secondary | ICD-10-CM | POA: Insufficient documentation

## 2019-01-21 DIAGNOSIS — I5033 Acute on chronic diastolic (congestive) heart failure: Secondary | ICD-10-CM | POA: Diagnosis not present

## 2019-01-21 DIAGNOSIS — Z79899 Other long term (current) drug therapy: Secondary | ICD-10-CM | POA: Insufficient documentation

## 2019-01-21 DIAGNOSIS — R0602 Shortness of breath: Secondary | ICD-10-CM | POA: Diagnosis present

## 2019-01-21 DIAGNOSIS — Z952 Presence of prosthetic heart valve: Secondary | ICD-10-CM | POA: Insufficient documentation

## 2019-01-21 DIAGNOSIS — F17210 Nicotine dependence, cigarettes, uncomplicated: Secondary | ICD-10-CM | POA: Insufficient documentation

## 2019-01-21 DIAGNOSIS — J449 Chronic obstructive pulmonary disease, unspecified: Secondary | ICD-10-CM | POA: Insufficient documentation

## 2019-01-21 LAB — BASIC METABOLIC PANEL
Anion gap: 4 — ABNORMAL LOW (ref 5–15)
BUN: 17 mg/dL (ref 6–20)
CO2: 25 mmol/L (ref 22–32)
Calcium: 8.6 mg/dL — ABNORMAL LOW (ref 8.9–10.3)
Chloride: 108 mmol/L (ref 98–111)
Creatinine, Ser: 1.18 mg/dL (ref 0.61–1.24)
GFR calc Af Amer: >60 mL/min (ref >60)
GFR calc non Af Amer: >60 mL/min (ref >60)
Glucose, Bld: 130 mg/dL — ABNORMAL HIGH (ref 70–99)
Potassium: 4.1 mmol/L (ref 3.5–5.1)
Sodium: 137 mmol/L (ref 135–145)

## 2019-01-21 LAB — CBC
HCT: 38.9 % — ABNORMAL LOW (ref 39.0–52.0)
Hemoglobin: 12.6 g/dL — ABNORMAL LOW (ref 13.0–17.0)
MCH: 27 pg (ref 26.0–34.0)
MCHC: 32.4 g/dL (ref 30.0–36.0)
MCV: 83.3 fL (ref 80.0–100.0)
Platelets: 187 10*3/uL (ref 150–400)
RBC: 4.67 MIL/uL (ref 4.22–5.81)
RDW: 15.2 % (ref 11.5–15.5)
WBC: 6.4 10*3/uL (ref 4.0–10.5)
nRBC: 0 % (ref 0.0–0.2)

## 2019-01-21 MED ORDER — FUROSEMIDE 10 MG/ML IJ SOLN
40.0000 mg | Freq: Once | INTRAMUSCULAR | Status: AC
  Administered 2019-01-21: 40 mg via INTRAVENOUS
  Filled 2019-01-21: qty 4

## 2019-01-21 MED ORDER — AZITHROMYCIN 250 MG PO TABS: ORAL_TABLET | ORAL | 0 refills | Status: DC

## 2019-01-21 MED ORDER — POTASSIUM CHLORIDE ER 10 MEQ PO TBCR: 20.0000 meq | EXTENDED_RELEASE_TABLET | Freq: Every day | ORAL | 0 refills | Status: DC

## 2019-01-21 MED ORDER — ALBUTEROL SULFATE HFA 108 (90 BASE) MCG/ACT IN AERS: 2.0000 | INHALATION_SPRAY | RESPIRATORY_TRACT | 0 refills | Status: AC | PRN

## 2019-01-21 NOTE — ED Triage Notes (Signed)
Pt in via POV, reports ongoing shortness of breath x a few weeks.  Also mentions left leg swelling; reports recently being started on lasix.  NAD noted at this time.

## 2019-01-21 NOTE — ED Notes (Signed)
Pt in NAD at this time- introduced self to pt

## 2019-01-21 NOTE — ED Notes (Signed)
FIRST NURSE NOTE: pt walks to the front desk independently with a steady gait. Pt reports shortness of breath for the last 2 weeks. Pt able to maintain conversation with NAD observed at this time

## 2019-01-21 NOTE — Discharge Instructions (Signed)
Do the following with your medication:  For your FUROSEMIDE/LASIX: - Starting tonight, we are going to INCREASE your dose for the next several days - Start taking TWO TABLETS instead of ONE TABLET  - Tomorrow, 10/16, take TWO TABLETS in the morning then [place] - If you are unable to follow up with the clinic, take TWO TABLETS of furosemide again at night, then go back to TWO TABLETS once a day the next day - Call your PCP to set up an appointment  - When taking the higher dose of lasix, potassium can get low so I've prescribed a replacement for a few days  - I've also prescribed an antibiotic and inhaler in case your bronchitis is flaring up

## 2019-01-21 NOTE — ED Notes (Signed)
Dr Isaacs at bedside 

## 2019-01-21 NOTE — ED Provider Notes (Signed)
[facility] Emergency Department Provider Note  ____________________________________________   First MD Initiated Contact with Patient 01/21/19 1822     (approximate)  I have reviewed the triage vital signs and the nursing notes.   HISTORY  Chief Complaint Shortness of Breath    HPI [name] is a 58 y.o. 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 Pt reports that over the past week or two, he's had progressively worsening DOE. He reports that he was recentlys tarted on lasix and he has not been peeing as much as he "thinks he should." he endorses worsening DOE and sensation of "smothering" when he lays flat. He has had associated increasing LE edema and cough, which is dry and worse when lying flat. He states his SOB is primarily w/ exertion but now at rest when lying flat. No fevers. He continues to smoke, denies any change w/ inhalers though. No other complaints.       Past Medical History:  Diagnosis Date  . Alcohol abuse   . Bipolar 1 disorder (HCC)   . COPD (chronic obstructive pulmonary disease) (HCC)   . Hip pain     Patient Active Problem List   Diagnosis Date Noted  . Pleural effusion 01/06/2019  . Mitral regurgitation 01/06/2019  . Pulmonary hypertension (HCC) 01/06/2019  . COPD (chronic obstructive pulmonary disease) (HCC) 01/06/2019  . Bipolar 1 disorder (HCC) 01/06/2019    Past Surgical History:  Procedure Laterality Date  . AORTIC VALVE REPLACEMENT      Prior to Admission medications   Medication Sig Start Date End Date Taking? Authorizing Provider  acetaminophen (TYLENOL) 500 MG tablet Take 1,000 mg by mouth every 8 (eight) hours. 10/30/18 10/30/19  [provider]  albuterol (VENTOLIN HFA) 108 (90 Base) MCG/ACT inhaler Inhale 2 puffs into the lungs every 4 (four) hours as needed for wheezing or shortness of breath. 01/21/19   Shaune Pollack, MD   aspirin 325 MG tablet Take 325 mg by mouth daily.    [provider]  atorvastatin (LIPITOR) 40 MG tablet Take 40 mg by mouth daily. 10/30/18 10/30/19  [provider]  azithromycin (ZITHROMAX) 250 MG tablet Take as directed on package. 01/21/19   Shaune Pollack, MD  DULoxetine (CYMBALTA) 30 MG capsule Take 30 mg by mouth daily. 09/08/18   [provider]  FLUoxetine (PROZAC) 20 MG capsule Take 20 mg by mouth every morning. 08/26/18   [provider]  furosemide (LASIX) 20 MG tablet Take 1 tablet (20 mg total) by mouth daily. 01/08/19 01/08/20  Altamese Dilling, MD  gabapentin (NEURONTIN) 300 MG capsule Take 300 mg by mouth 3 (three) times daily. 04/14/18 04/14/19  [provider]  glipiZIDE (GLUCOTROL) 5 MG tablet Take 5 mg by mouth daily before breakfast.    [provider]  meloxicam (MOBIC) 15 MG tablet Take 15 mg by mouth daily. 10/30/18   [provider]  metoprolol tartrate (LOPRESSOR) 25 MG tablet Take 25 mg by mouth daily. 08/26/18   [provider]  mirtazapine (REMERON) 15 MG tablet Take 15 mg by mouth at bedtime. 09/08/18 09/08/19  [provider]  naproxen (NAPROSYN) 500 MG tablet Take 500 mg by mouth 2 (two) times daily with a meal.    [provider]  potassium chloride (KLOR-CON) 10 MEQ tablet Take 2 tablets (20 mEq total) by mouth daily for 3 days. 01/21/19 01/24/19  Shaune Pollack, MD  Allergies Patient has no known allergies.  No family history on file.  Social History Social History   Tobacco Use  . Smoking status: Current Every Day Smoker    Packs/day: 1.00    Types: Cigarettes  . Smokeless tobacco: Never Used  Substance Use Topics  . Alcohol use: Yes  . Drug use: Yes    Types: Cocaine    Review of Systems  Review of Systems  Constitutional: Positive for fatigue. Negative for chills and fever.  HENT: Negative for sore throat.   Respiratory: Positive for cough and shortness of  breath.   Cardiovascular: Positive for leg swelling. Negative for chest pain.  Gastrointestinal: Negative for abdominal pain.  Genitourinary: Negative for flank pain.  Musculoskeletal: Negative for neck pain.  Skin: Negative for rash and wound.  Allergic/Immunologic: Negative for immunocompromised state.  Neurological: Positive for weakness. Negative for numbness.  Hematological: Does not bruise/bleed easily.  All other systems reviewed and are negative.    ____________________________________________  PHYSICAL EXAM:      VITAL SIGNS: ED Triage Vitals  Enc Vitals Group     BP 01/21/19 1659 117/88     Pulse Rate 01/21/19 1659 100     Resp 01/21/19 1659 20     Temp 01/21/19 1659 (!) 97.5 F (36.4 C)     Temp Source 01/21/19 1659 Oral     SpO2 01/21/19 1659 95 %     Weight 01/21/19 1651 160 lb (72.6 kg)     Height 01/21/19 1651 5\' 8"  (1.727 m)     Head Circumference --      Peak Flow --      Pain Score 01/21/19 1651 10     Pain Loc --      Pain Edu? --      Excl. in [place]? --      Physical Exam Vitals signs and nursing note reviewed.  Constitutional:      General: He is not in acute distress.    Appearance: He is well-developed.  HENT:     Head: Normocephalic and atraumatic.  Eyes:     Conjunctiva/sclera: Conjunctivae normal.  Neck:     Musculoskeletal: Neck supple.  Cardiovascular:     Rate and Rhythm: Normal rate and regular rhythm.     Heart sounds: Normal heart sounds. No murmur. No friction rub.  Pulmonary:     Effort: Pulmonary effort is normal. No respiratory distress.     Breath sounds: Examination of the right-lower field reveals rales. Examination of the left-lower field reveals rales. Rales present. No wheezing.  Abdominal:     General: There is no distension.     Palpations: Abdomen is soft.     Tenderness: There is no abdominal tenderness.  Musculoskeletal:     Right lower leg: Edema present.     Left lower leg: Edema present.  Skin:    General:  Skin is warm.     Capillary Refill: Capillary refill takes less than 2 seconds.  Neurological:     Mental Status: He is alert and oriented to person, place, and time.     Motor: No abnormal muscle tone.       ____________________________________________   LABS (all labs ordered are listed, but only abnormal results are displayed)  Labs Reviewed  CBC - Abnormal; Notable for the following components:      Result Value   Hemoglobin 12.6 (*)    HCT 38.9 (*)    All other components within normal limits  BASIC METABOLIC PANEL - Abnormal; Notable for the following components:   Glucose, Bld 130 (*)    Calcium 8.6 (*)    Anion gap 4 (*)    All other components within normal limits    ____________________________________________  EKG: Sinus tachycardia, occasional pACs. PR 188, QRS 78, QTc 487. No ischemic changes. ________________________________________  RADIOLOGY All imaging, including plain films, CT scans, and ultrasounds, independently reviewed by me, and interpretations confirmed via formal radiology reads.  ED MD interpretation:   Chest x-ray: Mild CHF, asymmetric atelectasis versus possibly infection  Official radiology report(s): Dg Chest 2 View  Result Date: 01/21/2019 CLINICAL DATA:  Dyspnea for few weeks with left lower extremity swelling EXAM: CHEST - 2 VIEW COMPARISON:  01/08/2019 chest radiograph. FINDINGS: Intact sternotomy wires. Mitral annuloplasty ring in place. Stable cardiomediastinal silhouette with mild cardiomegaly. No pneumothorax. Small right pleural effusion is increased. No left pleural effusion. Mild pulmonary edema. Patchy right greater than left bibasilar lung opacities worsened on the right. IMPRESSION: 1. Mild congestive heart failure with increased small right pleural effusion. 2. Patchy bibasilar lung opacities, right greater than left, worsened on the right, favor atelectasis, cannot exclude a component of aspiration or pneumonia. Chest radiograph  follow-up advised. Electronically Signed   By: Delbert Phenix M.D.   On: 01/21/2019 17:28    ____________________________________________  PROCEDURES   Procedure(s) performed (including Critical Care):  Procedures  ____________________________________________  INITIAL IMPRESSION / MDM / ASSESSMENT AND PLAN / ED COURSE  As part of my medical decision making, I reviewed the following data within the electronic MEDICAL RECORD NUMBER       *IDA UPPAL was evaluated in Emergency Department on 01/21/2019 for the symptoms described in the history of present illness. He was evaluated in the context of the global COVID-19 pandemic, which necessitated consideration that the patient might be at risk for infection with the SARS-CoV-2 virus that causes COVID-19. Institutional protocols and algorithms that pertain to the evaluation of patients at risk for COVID-19 are in a state of rapid change based on information released by regulatory bodies including the CDC and federal and state organizations. These policies and algorithms were followed during the patient's care in the ED.  Some ED evaluations and interventions may be delayed as a result of limited staffing during the pandemic.*      Medical Decision Making:  58 yo M here with SOB w/ exertion, orthopnea, bilateral pitting leg edema. Suspect ongoing CHF 2/2 DCM. CXR shows interstitial edema, less likely atypical PNA. Pt has no fever, no sputum production, no signs of sepsis clinically. He has no chest pain, and EKG is nonischemic - doubt ACS. Pt given a dose of IV Lasix here with excellent effect, and he has diuresed >1 L in the ED. Following diuresis, pt ambulatory throughout ED without difficulty with sats >95% on RA. He feels markedly improved. Will set him up for f/u in CHF clinic given known dilated cardiomyopathy with dCHF and valvular disease, and refer for outpt follow-up with his PCP. Given question of superimposed infection, will give azithro  though suspect this is mostly CHF related.  ____________________________________________  FINAL CLINICAL IMPRESSION(S) / ED DIAGNOSES  Final diagnoses:  Acute on chronic diastolic congestive heart failure (HCC)     MEDICATIONS GIVEN DURING THIS VISIT:  Medications  furosemide (LASIX) injection 40 mg (40 mg Intravenous Given 01/21/19 1933)     ED Discharge Orders         Ordered    AMB referral  to CHF clinic     01/21/19 2115    azithromycin (ZITHROMAX) 250 MG tablet     01/21/19 2118    potassium chloride (KLOR-CON) 10 MEQ tablet  Daily     01/21/19 2118    albuterol (VENTOLIN HFA) 108 (90 Base) MCG/ACT inhaler  Every 4 hours PRN     01/21/19 2118           Note:  This document was prepared using Dragon voice recognition software and may include unintentional dictation errors.   Shaune PollackIsaacs, Elinore Shults, MD 01/21/19 [phone]

## 2019-01-25 ENCOUNTER — Other Ambulatory Visit: Payer: Self-pay

## 2019-01-25 ENCOUNTER — Ambulatory Visit: Payer: Medicaid Other | Attending: Family | Admitting: Family

## 2019-01-25 ENCOUNTER — Encounter: Payer: Self-pay | Admitting: Family

## 2019-01-25 VITALS — BP 129/53 | HR 95 | Resp 18 | Ht 68.0 in | Wt 158.0 lb

## 2019-01-25 DIAGNOSIS — J449 Chronic obstructive pulmonary disease, unspecified: Secondary | ICD-10-CM | POA: Diagnosis not present

## 2019-01-25 DIAGNOSIS — I272 Pulmonary hypertension, unspecified: Secondary | ICD-10-CM | POA: Insufficient documentation

## 2019-01-25 DIAGNOSIS — Z79899 Other long term (current) drug therapy: Secondary | ICD-10-CM | POA: Insufficient documentation

## 2019-01-25 DIAGNOSIS — F17210 Nicotine dependence, cigarettes, uncomplicated: Secondary | ICD-10-CM | POA: Insufficient documentation

## 2019-01-25 DIAGNOSIS — I10 Essential (primary) hypertension: Secondary | ICD-10-CM

## 2019-01-25 DIAGNOSIS — Z7982 Long term (current) use of aspirin: Secondary | ICD-10-CM | POA: Diagnosis not present

## 2019-01-25 DIAGNOSIS — Z952 Presence of prosthetic heart valve: Secondary | ICD-10-CM | POA: Diagnosis not present

## 2019-01-25 DIAGNOSIS — I359 Nonrheumatic aortic valve disorder, unspecified: Secondary | ICD-10-CM

## 2019-01-25 DIAGNOSIS — F319 Bipolar disorder, unspecified: Secondary | ICD-10-CM | POA: Insufficient documentation

## 2019-01-25 DIAGNOSIS — Z7984 Long term (current) use of oral hypoglycemic drugs: Secondary | ICD-10-CM | POA: Diagnosis not present

## 2019-01-25 DIAGNOSIS — I890 Lymphedema, not elsewhere classified: Secondary | ICD-10-CM | POA: Insufficient documentation

## 2019-01-25 DIAGNOSIS — I110 Hypertensive heart disease with heart failure: Secondary | ICD-10-CM | POA: Insufficient documentation

## 2019-01-25 DIAGNOSIS — I5032 Chronic diastolic (congestive) heart failure: Secondary | ICD-10-CM | POA: Diagnosis present

## 2019-01-25 DIAGNOSIS — Z791 Long term (current) use of non-steroidal anti-inflammatories (NSAID): Secondary | ICD-10-CM | POA: Diagnosis not present

## 2019-01-25 DIAGNOSIS — J42 Unspecified chronic bronchitis: Secondary | ICD-10-CM

## 2019-01-25 DIAGNOSIS — Z720 Tobacco use: Secondary | ICD-10-CM

## 2019-01-25 NOTE — Patient Instructions (Addendum)
Begin weighing daily and call for an overnight weight gain of > 2 pounds or a weekly weight gain of >5 pounds.  Call and schedule your cardiology appointment at [facility].

## 2019-01-25 NOTE — Progress Notes (Signed)
Patient ID: Mike MilletMichael L Dawson, male    DOB: 04/22/1960, 58 y.o.   MRN: [id number]  HPI  [name] is a 58 y/o male with a history of HTN, COPD, alcohol abuse, bipolar, pulmonary HTN, valvular heart disease, current tobacco use and chronic heart failure.   Echo report from 01/07/2019 reviewed and showed an EF of 50-55% along with moderate Mike, mild AR/PR and severe TR along with severely elevated PA pressure.   Was in the ED 01/21/2019 due to acute on chronic HF. Was given IV lasix with diuresis >1L. Admitted 01/06/2019 due to pleural effusion and acute on chronic HF. Cardiology consult obtained. Initially given IV lasix and then transitioned to oral diuretics. Discharged after 2 days. Was in the ED 12/28/2018 due to bronchitis where he was treated and released.   He presents today for his initial visit with a chief complaint of moderate shortness of breath upon minimal exertion (although improving). He has associated fatigue, cough, wheezing, intermittent chest pain, pedal edema (improving), abdominal distention (improving), light-headedness and difficulty sleeping along with this. He denies any palpitations or weight gain although admits that he's not weighing himself on a daily basis but does have scales at home.   Has prescriptions that haven't been filled from his recent ED visit that he says that he's getting filled today. Does have a case worker with him (due to bipolar disease) who will make sure he gets the RX's filled.   Past Medical History:  Diagnosis Date  . Alcohol abuse   . Bipolar 1 disorder (HCC)   . CHF (congestive heart failure) (HCC)   . COPD (chronic obstructive pulmonary disease) (HCC)   . Hip pain   . Hypertension   . Pulmonary HTN (HCC)   . Valvular heart disease    Past Surgical History:  Procedure Laterality Date  . AORTIC VALVE REPLACEMENT     History reviewed. No pertinent family history. Social History   Tobacco Use  . Smoking status: Current Every Day Smoker   Packs/day: 1.00    Types: Cigarettes  . Smokeless tobacco: Never Used  Substance Use Topics  . Alcohol use: Yes   No Known Allergies Prior to Admission medications   Medication Sig Start Date End Date Taking? Authorizing Provider  acetaminophen (TYLENOL) 500 MG tablet Take 1,000 mg by mouth every 8 (eight) hours. 10/30/18 10/30/19 Yes [provider]  albuterol (VENTOLIN HFA) 108 (90 Base) MCG/ACT inhaler Inhale 2 puffs into the lungs every 4 (four) hours as needed for wheezing or shortness of breath. 01/21/19  Yes Shaune PollackIsaacs, Cameron, MD  aspirin 325 MG tablet Take 325 mg by mouth daily.   Yes [provider]  atorvastatin (LIPITOR) 40 MG tablet Take 40 mg by mouth daily. 10/30/18 10/30/19 Yes [provider]  DULoxetine (CYMBALTA) 30 MG capsule Take 30 mg by mouth daily. 09/08/18  Yes [provider]  FLUoxetine (PROZAC) 20 MG capsule Take 20 mg by mouth every morning. 08/26/18  Yes [provider]  furosemide (LASIX) 20 MG tablet Take 1 tablet (20 mg total) by mouth daily. Patient taking differently: Take 40 mg by mouth daily.  01/08/19 01/08/20 Yes Altamese DillingVachhani, Vaibhavkumar, MD  gabapentin (NEURONTIN) 300 MG capsule Take 300 mg by mouth 3 (three) times daily. 04/14/18 04/14/19 Yes [provider]  glipiZIDE (GLUCOTROL) 5 MG tablet Take 5 mg by mouth daily before breakfast.   Yes [provider]  meloxicam (MOBIC) 15 MG tablet Take 15 mg by mouth daily. 10/30/18  Yes [provider]  metoprolol tartrate (LOPRESSOR) 25 MG tablet Take 25 mg by mouth daily. 08/26/18  Yes [provider]  mirtazapine (REMERON) 15 MG tablet Take 15 mg by mouth at bedtime. 09/08/18 09/08/19 Yes [provider]  naproxen (NAPROSYN) 500 MG tablet Take 500 mg by mouth 2 (two) times daily with a meal.   Yes [provider]  azithromycin (ZITHROMAX) 250 MG tablet Take as directed on package. Patient not taking: Reported on 01/25/2019 01/21/19    Shaune Pollack, MD  potassium chloride (KLOR-CON) 10 MEQ tablet Take 2 tablets (20 mEq total) by mouth daily for 3 days. Patient not taking: Reported on 01/25/2019 01/21/19 01/25/19  Shaune Pollack, MD     Review of Systems  Constitutional: Positive for fatigue. Negative for appetite change.  HENT: Positive for rhinorrhea. Negative for congestion and sore throat.   Eyes: Negative.   Respiratory: Positive for cough, shortness of breath and wheezing.   Cardiovascular: Positive for chest pain (at times) and leg swelling. Negative for palpitations.  Gastrointestinal: Positive for abdominal distention and diarrhea. Negative for abdominal pain.  Endocrine: Negative.   Genitourinary: Negative.   Musculoskeletal: Positive for arthralgias (left hip). Negative for back pain.  Skin: Negative.   Allergic/Immunologic: Negative.   Neurological: Positive for light-headedness (at times). Negative for dizziness.  Hematological: Negative for adenopathy. Does not bruise/bleed easily.  Psychiatric/Behavioral: Positive for sleep disturbance (+ orthopnea). Negative for dysphoric mood. The patient is nervous/anxious.    Vitals:   01/25/19 0837  BP: (!) 129/53  Pulse: 95  Resp: 18  SpO2: 100%  Weight: 158 lb (71.7 kg)  Height: 5\' 8"  (1.727 m)   Wt Readings from Last 3 Encounters:  01/25/19 158 lb (71.7 kg)  01/21/19 160 lb (72.6 kg)  01/08/19 141 lb (64 kg)   Lab Results  Component Value Date   CREATININE 1.18 01/21/2019   CREATININE 1.12 01/08/2019   CREATININE 1.00 01/07/2019    Physical Exam Vitals signs and nursing note reviewed.  Constitutional:      Appearance: He is well-developed.  HENT:     Head: Normocephalic and atraumatic.  Neck:     Musculoskeletal: Normal range of motion and neck supple.     Vascular: No JVD.  Cardiovascular:     Rate and Rhythm: Regular rhythm. Tachycardia present.  Pulmonary:     Effort: Pulmonary effort is normal. No respiratory distress.     Breath  sounds: Examination of the right-lower field reveals wheezing. Examination of the left-lower field reveals wheezing. Wheezing present. No rales.  Abdominal:     Palpations: Abdomen is soft.     Tenderness: There is no abdominal tenderness.  Musculoskeletal:     Right lower leg: He exhibits no tenderness. Edema (2+ pitting) present.     Left lower leg: He exhibits no tenderness. Edema (2+ pitting) present.  Skin:    General: Skin is warm and dry.  Neurological:     General: No focal deficit present.     Mental Status: He is alert and oriented to person, place, and time.  Psychiatric:        Mood and Affect: Mood is anxious.        Behavior: Behavior normal.    Assessment & Plan:  1: Chronic heart failure with preserved ejection fraction- - NYHA class III - minimal fluid overload with lower extremity edema although patient says that it's improving - hasn't been weighing daily but does have scales; instructed him to weight  every morning after using the bathroom, write the weight down and call for an overnight weight gain of >2 pounds or a weekly weight gain of >5 pounds - not adding salt and tries to eat low sodium foods - discussed changing his diuretic to torsemide but will hold off right now since he says that his symptoms are improving - will make referral to paramedicine program  - BNP 01/06/2019 was 1112.0  2: COPD/ pulmonary HTN- - looks like he hasn't seen a pulmonologist since 2015 - has albuterol inhaler for PRN use  3: HTN- - BP looks good today - saw PCP @ [facility] 10/30/2018 & returns on 01/27/2019 - BMP from 01/21/2019 reviewed and showed sodium 137, potassium 4.1, creatinine 1.18 and GFR >60  4: Tobacco use- - currently smokes 1 ppd of cigarettes - complete cessation discussed for 3 minutes with him  5: Valvular disease- - had aortic valve replaced 1990 with repeat replacement in 2000 - saw cardiology ([facility]liter) 10/10/17; instructed to make a follow-up appointment and  he says that when he sees his PCP in 2 days, he'll ask them to make the appointment for him  6: Lymphedema- - stage 2 - he says that he's elevating his legs at night and when he wakes up in the mornings, the swelling is gone but then quickly returns - encouraged him to elevate his legs during the day when sitting for long periods of time as well as to get compression socks and put them on in the mornings with removal at bedtime - does do some walking but gets short of breath easily - consider lymphapress compression boots if edema persists  Medication list was reviewed.  Return in 2 weeks or sooner for any questions/problems before then.

## 2019-01-28 ENCOUNTER — Other Ambulatory Visit: Payer: Self-pay | Admitting: Family

## 2019-01-28 ENCOUNTER — Other Ambulatory Visit (HOSPITAL_COMMUNITY): Payer: Self-pay

## 2019-01-28 ENCOUNTER — Encounter (HOSPITAL_COMMUNITY): Payer: Self-pay

## 2019-01-28 MED ORDER — POTASSIUM CHLORIDE ER 20 MEQ PO TBCR: 20.0000 meq | EXTENDED_RELEASE_TABLET | Freq: Every day | ORAL | 5 refills | Status: AC

## 2019-01-28 MED ORDER — FUROSEMIDE 40 MG PO TABS: 40.0000 mg | ORAL_TABLET | Freq: Every day | ORAL | 5 refills | Status: DC

## 2019-01-28 MED ORDER — AZITHROMYCIN 250 MG PO TABS: ORAL_TABLET | ORAL | 0 refills | Status: DC

## 2019-01-28 NOTE — Progress Notes (Signed)
Today was a home visit for [name].  Today was a visit to discuss the program and to evaluate him.  He appears short of breath but lung sounds are clear.  Could be anxious.  He is out of some medications and about out of others.  He states can not afford them at this time.  His [facility] doctor has took him off of the glipizide.  He is out of gabapentin, potassium and almost out of lasix.  He has swelling in left leg from chin to foot, right leg he has none.  His scale is not working right, gave him a new scale to weigh with.  Advised him to weigh daily.  He denies chest pain, dizziness or headaches.  Will contact someone to see if we can get his medications for him.  Will visit for heart failure and he is willing to be part of the program.    [place] [phone]

## 2019-01-30 ENCOUNTER — Emergency Department: Payer: Medicaid Other

## 2019-01-30 ENCOUNTER — Encounter: Payer: Self-pay | Admitting: Emergency Medicine

## 2019-01-30 ENCOUNTER — Other Ambulatory Visit: Payer: Self-pay

## 2019-01-30 ENCOUNTER — Inpatient Hospital Stay
Admission: EM | Admit: 2019-01-30 | Discharge: 2019-02-07 | DRG: 291 | Disposition: A | Discharge status: Home / Self Care | Payer: Medicaid Other | Attending: Internal Medicine | Admitting: Internal Medicine

## 2019-01-30 DIAGNOSIS — K7031 Alcoholic cirrhosis of liver with ascites: Secondary | ICD-10-CM | POA: Diagnosis present

## 2019-01-30 DIAGNOSIS — Z952 Presence of prosthetic heart valve: Secondary | ICD-10-CM | POA: Diagnosis not present

## 2019-01-30 DIAGNOSIS — D689 Coagulation defect, unspecified: Secondary | ICD-10-CM | POA: Diagnosis present

## 2019-01-30 DIAGNOSIS — B182 Chronic viral hepatitis C: Secondary | ICD-10-CM | POA: Diagnosis present

## 2019-01-30 DIAGNOSIS — Z23 Encounter for immunization: Secondary | ICD-10-CM

## 2019-01-30 DIAGNOSIS — N179 Acute kidney failure, unspecified: Secondary | ICD-10-CM | POA: Diagnosis present

## 2019-01-30 DIAGNOSIS — J9601 Acute respiratory failure with hypoxia: Secondary | ICD-10-CM | POA: Diagnosis present

## 2019-01-30 DIAGNOSIS — I5023 Acute on chronic systolic (congestive) heart failure: Principal | ICD-10-CM

## 2019-01-30 DIAGNOSIS — Z791 Long term (current) use of non-steroidal anti-inflammatories (NSAID): Secondary | ICD-10-CM | POA: Diagnosis not present

## 2019-01-30 DIAGNOSIS — T405X1A Poisoning by cocaine, accidental (unintentional), initial encounter: Secondary | ICD-10-CM | POA: Diagnosis present

## 2019-01-30 DIAGNOSIS — F1010 Alcohol abuse, uncomplicated: Secondary | ICD-10-CM | POA: Diagnosis present

## 2019-01-30 DIAGNOSIS — T501X6A Underdosing of loop [high-ceiling] diuretics, initial encounter: Secondary | ICD-10-CM | POA: Diagnosis present

## 2019-01-30 DIAGNOSIS — Z7982 Long term (current) use of aspirin: Secondary | ICD-10-CM | POA: Diagnosis not present

## 2019-01-30 DIAGNOSIS — K759 Inflammatory liver disease, unspecified: Secondary | ICD-10-CM | POA: Diagnosis not present

## 2019-01-30 DIAGNOSIS — I248 Other forms of acute ischemic heart disease: Secondary | ICD-10-CM | POA: Diagnosis present

## 2019-01-30 DIAGNOSIS — F1410 Cocaine abuse, uncomplicated: Secondary | ICD-10-CM | POA: Diagnosis not present

## 2019-01-30 DIAGNOSIS — I959 Hypotension, unspecified: Secondary | ICD-10-CM | POA: Diagnosis not present

## 2019-01-30 DIAGNOSIS — K766 Portal hypertension: Secondary | ICD-10-CM | POA: Diagnosis present

## 2019-01-30 DIAGNOSIS — J9811 Atelectasis: Secondary | ICD-10-CM | POA: Diagnosis present

## 2019-01-30 DIAGNOSIS — R109 Unspecified abdominal pain: Secondary | ICD-10-CM

## 2019-01-30 DIAGNOSIS — Z91120 Patient's intentional underdosing of medication regimen due to financial hardship: Secondary | ICD-10-CM

## 2019-01-30 DIAGNOSIS — I43 Cardiomyopathy in diseases classified elsewhere: Secondary | ICD-10-CM | POA: Diagnosis present

## 2019-01-30 DIAGNOSIS — J432 Centrilobular emphysema: Secondary | ICD-10-CM | POA: Diagnosis present

## 2019-01-30 DIAGNOSIS — F17210 Nicotine dependence, cigarettes, uncomplicated: Secondary | ICD-10-CM | POA: Diagnosis present

## 2019-01-30 DIAGNOSIS — I2721 Secondary pulmonary arterial hypertension: Secondary | ICD-10-CM | POA: Diagnosis present

## 2019-01-30 DIAGNOSIS — K7011 Alcoholic hepatitis with ascites: Secondary | ICD-10-CM | POA: Diagnosis present

## 2019-01-30 DIAGNOSIS — F1490 Cocaine use, unspecified, uncomplicated: Secondary | ICD-10-CM | POA: Diagnosis present

## 2019-01-30 DIAGNOSIS — Z20828 Contact with and (suspected) exposure to other viral communicable diseases: Secondary | ICD-10-CM | POA: Diagnosis present

## 2019-01-30 DIAGNOSIS — R040 Epistaxis: Secondary | ICD-10-CM | POA: Diagnosis not present

## 2019-01-30 DIAGNOSIS — E876 Hypokalemia: Secondary | ICD-10-CM | POA: Diagnosis not present

## 2019-01-30 DIAGNOSIS — I081 Rheumatic disorders of both mitral and tricuspid valves: Secondary | ICD-10-CM | POA: Diagnosis present

## 2019-01-30 DIAGNOSIS — T426X6A Underdosing of other antiepileptic and sedative-hypnotic drugs, initial encounter: Secondary | ICD-10-CM | POA: Diagnosis present

## 2019-01-30 DIAGNOSIS — K7200 Acute and subacute hepatic failure without coma: Secondary | ICD-10-CM | POA: Diagnosis not present

## 2019-01-30 DIAGNOSIS — I130 Hypertensive heart and chronic kidney disease with heart failure and stage 1 through stage 4 chronic kidney disease, or unspecified chronic kidney disease: Principal | ICD-10-CM | POA: Diagnosis present

## 2019-01-30 DIAGNOSIS — K819 Cholecystitis, unspecified: Secondary | ICD-10-CM | POA: Diagnosis present

## 2019-01-30 DIAGNOSIS — T503X6A Underdosing of electrolytic, caloric and water-balance agents, initial encounter: Secondary | ICD-10-CM | POA: Diagnosis present

## 2019-01-30 DIAGNOSIS — E785 Hyperlipidemia, unspecified: Secondary | ICD-10-CM | POA: Diagnosis present

## 2019-01-30 DIAGNOSIS — R7989 Other specified abnormal findings of blood chemistry: Secondary | ICD-10-CM | POA: Diagnosis not present

## 2019-01-30 DIAGNOSIS — R34 Anuria and oliguria: Secondary | ICD-10-CM | POA: Diagnosis not present

## 2019-01-30 DIAGNOSIS — I509 Heart failure, unspecified: Secondary | ICD-10-CM

## 2019-01-30 DIAGNOSIS — K7110 Toxic liver disease with hepatic necrosis, without coma: Secondary | ICD-10-CM | POA: Diagnosis present

## 2019-01-30 DIAGNOSIS — E875 Hyperkalemia: Secondary | ICD-10-CM | POA: Diagnosis present

## 2019-01-30 DIAGNOSIS — I5043 Acute on chronic combined systolic (congestive) and diastolic (congestive) heart failure: Secondary | ICD-10-CM | POA: Diagnosis present

## 2019-01-30 DIAGNOSIS — I420 Dilated cardiomyopathy: Secondary | ICD-10-CM | POA: Diagnosis present

## 2019-01-30 DIAGNOSIS — K5900 Constipation, unspecified: Secondary | ICD-10-CM | POA: Diagnosis not present

## 2019-01-30 DIAGNOSIS — N182 Chronic kidney disease, stage 2 (mild): Secondary | ICD-10-CM | POA: Diagnosis present

## 2019-01-30 DIAGNOSIS — R140 Abdominal distension (gaseous): Secondary | ICD-10-CM | POA: Diagnosis not present

## 2019-01-30 DIAGNOSIS — G8929 Other chronic pain: Secondary | ICD-10-CM | POA: Diagnosis present

## 2019-01-30 DIAGNOSIS — Z79899 Other long term (current) drug therapy: Secondary | ICD-10-CM

## 2019-01-30 LAB — BLOOD GAS, VENOUS
Acid-Base Excess: 0.3 mmol/L (ref 0.0–2.0)
Bicarbonate: 23.5 mmol/L (ref 20.0–28.0)
O2 Saturation: 92.7 %
Patient temperature: 37
pCO2, Ven: 33 mmHg — ABNORMAL LOW (ref 44.0–60.0)
pH, Ven: 7.46 — ABNORMAL HIGH (ref 7.250–7.430)
pO2, Ven: 62 mmHg — ABNORMAL HIGH (ref 32.0–45.0)

## 2019-01-30 LAB — CBC WITH DIFFERENTIAL/PLATELET
Abs Immature Granulocytes: 0.07 10*3/uL (ref 0.00–0.07)
Basophils Absolute: 0 10*3/uL (ref 0.0–0.1)
Basophils Relative: 0 %
Eosinophils Absolute: 0.1 10*3/uL (ref 0.0–0.5)
Eosinophils Relative: 2 %
HCT: 37.3 % — ABNORMAL LOW (ref 39.0–52.0)
Hemoglobin: 12 g/dL — ABNORMAL LOW (ref 13.0–17.0)
Immature Granulocytes: 1 %
Lymphocytes Relative: 21 %
Lymphs Abs: 1.5 10*3/uL (ref 0.7–4.0)
MCH: 25.9 pg — ABNORMAL LOW (ref 26.0–34.0)
MCHC: 32.2 g/dL (ref 30.0–36.0)
MCV: 80.4 fL (ref 80.0–100.0)
Monocytes Absolute: 1 10*3/uL (ref 0.1–1.0)
Monocytes Relative: 14 %
Neutro Abs: 4.5 10*3/uL (ref 1.7–7.7)
Neutrophils Relative %: 62 %
Platelets: 216 10*3/uL (ref 150–400)
RBC: 4.64 MIL/uL (ref 4.22–5.81)
RDW: 16 % — ABNORMAL HIGH (ref 11.5–15.5)
WBC: 7.1 10*3/uL (ref 4.0–10.5)
nRBC: 0.3 % — ABNORMAL HIGH (ref 0.0–0.2)

## 2019-01-30 LAB — COMPREHENSIVE METABOLIC PANEL
ALT: 64 U/L — ABNORMAL HIGH (ref 0–44)
AST: 90 U/L — ABNORMAL HIGH (ref 15–41)
Albumin: 3.5 g/dL (ref 3.5–5.0)
Alkaline Phosphatase: 121 U/L (ref 38–126)
Anion gap: 11 (ref 5–15)
BUN: 27 mg/dL — ABNORMAL HIGH (ref 6–20)
CO2: 22 mmol/L (ref 22–32)
Calcium: 8.7 mg/dL — ABNORMAL LOW (ref 8.9–10.3)
Chloride: 103 mmol/L (ref 98–111)
Creatinine, Ser: 1.29 mg/dL — ABNORMAL HIGH (ref 0.61–1.24)
GFR calc Af Amer: >60 mL/min (ref >60)
GFR calc non Af Amer: >60 mL/min (ref >60)
Glucose, Bld: 98 mg/dL (ref 70–99)
Potassium: 5.5 mmol/L — ABNORMAL HIGH (ref 3.5–5.1)
Sodium: 136 mmol/L (ref 135–145)
Total Bilirubin: 1.7 mg/dL — ABNORMAL HIGH (ref 0.3–1.2)
Total Protein: 6.9 g/dL (ref 6.5–8.1)

## 2019-01-30 LAB — SARS CORONAVIRUS 2 (TAT 6-24 HRS): SARS Coronavirus 2: NEGATIVE

## 2019-01-30 LAB — BRAIN NATRIURETIC PEPTIDE: B Natriuretic Peptide: 1234 pg/mL — ABNORMAL HIGH (ref 0.0–100.0)

## 2019-01-30 LAB — TROPONIN I (HIGH SENSITIVITY): Troponin I (High Sensitivity): 28 ng/L — ABNORMAL HIGH (ref <18)

## 2019-01-30 MED ORDER — ACETAMINOPHEN 325 MG PO TABS
650.0000 mg | ORAL_TABLET | ORAL | Status: DC | PRN
  Filled 2019-01-30: qty 2

## 2019-01-30 MED ORDER — SODIUM CHLORIDE 0.9% FLUSH
3.0000 mL | Freq: Two times a day (BID) | INTRAVENOUS | Status: DC
  Administered 2019-01-31 – 2019-02-06 (×10): 3 mL via INTRAVENOUS

## 2019-01-30 MED ORDER — ATORVASTATIN CALCIUM 20 MG PO TABS
40.0000 mg | ORAL_TABLET | Freq: Every day | ORAL | Status: DC
  Administered 2019-01-31 – 2019-02-02 (×3): 40 mg via ORAL
  Filled 2019-01-30 (×3): qty 2

## 2019-01-30 MED ORDER — SODIUM CHLORIDE 0.9% FLUSH
3.0000 mL | INTRAVENOUS | Status: DC | PRN
  Administered 2019-02-01: 3 mL via INTRAVENOUS
  Filled 2019-01-30: qty 3

## 2019-01-30 MED ORDER — ACETAMINOPHEN 500 MG PO TABS
1000.0000 mg | ORAL_TABLET | Freq: Three times a day (TID) | ORAL | Status: DC
  Administered 2019-01-31 – 2019-02-02 (×8): 1000 mg via ORAL
  Filled 2019-01-30 (×8): qty 2

## 2019-01-30 MED ORDER — ASPIRIN EC 325 MG PO TBEC
325.0000 mg | DELAYED_RELEASE_TABLET | Freq: Every day | ORAL | Status: DC
  Administered 2019-01-31 – 2019-02-02 (×4): 325 mg via ORAL
  Filled 2019-01-30 (×5): qty 1

## 2019-01-30 MED ORDER — MELOXICAM 7.5 MG PO TABS
15.0000 mg | ORAL_TABLET | Freq: Every day | ORAL | Status: DC
  Administered 2019-01-31 – 2019-02-02 (×4): 15 mg via ORAL
  Filled 2019-01-30 (×5): qty 2

## 2019-01-30 MED ORDER — ALBUTEROL SULFATE (2.5 MG/3ML) 0.083% IN NEBU: 2.5000 mg | INHALATION_SOLUTION | RESPIRATORY_TRACT | Status: DC | PRN

## 2019-01-30 MED ORDER — GABAPENTIN 300 MG PO CAPS
300.0000 mg | ORAL_CAPSULE | Freq: Three times a day (TID) | ORAL | Status: DC
  Administered 2019-01-31 – 2019-02-02 (×9): 300 mg via ORAL
  Filled 2019-01-30 (×9): qty 1

## 2019-01-30 MED ORDER — METOPROLOL TARTRATE 25 MG PO TABS
25.0000 mg | ORAL_TABLET | Freq: Every day | ORAL | Status: DC
  Administered 2019-01-31 – 2019-02-02 (×3): 25 mg via ORAL
  Filled 2019-01-30 (×4): qty 1

## 2019-01-30 MED ORDER — HEPARIN SODIUM (PORCINE) 5000 UNIT/ML IJ SOLN
5000.0000 [IU] | Freq: Three times a day (TID) | INTRAMUSCULAR | Status: DC
  Administered 2019-01-31 – 2019-02-03 (×11): 5000 [IU] via SUBCUTANEOUS
  Filled 2019-01-30 (×12): qty 1

## 2019-01-30 MED ORDER — FUROSEMIDE 10 MG/ML IJ SOLN
40.0000 mg | Freq: Two times a day (BID) | INTRAMUSCULAR | Status: DC
  Administered 2019-01-31 – 2019-02-01 (×3): 40 mg via INTRAVENOUS
  Filled 2019-01-30 (×3): qty 4

## 2019-01-30 MED ORDER — NAPROXEN 500 MG PO TABS
500.0000 mg | ORAL_TABLET | Freq: Two times a day (BID) | ORAL | Status: DC
  Administered 2019-01-31 – 2019-02-01 (×4): 500 mg via ORAL
  Filled 2019-01-30 (×6): qty 1

## 2019-01-30 MED ORDER — SODIUM CHLORIDE 0.9 % IV SOLN: 250.0000 mL | INTRAVENOUS | Status: DC | PRN

## 2019-01-30 MED ORDER — FLUOXETINE HCL 20 MG PO CAPS
20.0000 mg | ORAL_CAPSULE | ORAL | Status: DC
  Administered 2019-01-31 – 2019-02-02 (×3): 20 mg via ORAL
  Filled 2019-01-30 (×3): qty 1

## 2019-01-30 MED ORDER — DULOXETINE HCL 30 MG PO CPEP: 30.0000 mg | ORAL_CAPSULE | Freq: Every day | ORAL | Status: DC

## 2019-01-30 MED ORDER — IPRATROPIUM-ALBUTEROL 0.5-2.5 (3) MG/3ML IN SOLN
3.0000 mL | Freq: Once | RESPIRATORY_TRACT | Status: AC
  Administered 2019-01-30: 3 mL via RESPIRATORY_TRACT
  Filled 2019-01-30: qty 3

## 2019-01-30 MED ORDER — ONDANSETRON HCL 4 MG/2ML IJ SOLN
4.0000 mg | Freq: Four times a day (QID) | INTRAMUSCULAR | Status: DC | PRN
  Administered 2019-02-01 – 2019-02-02 (×2): 4 mg via INTRAVENOUS
  Filled 2019-01-30 (×2): qty 2

## 2019-01-30 MED ORDER — IPRATROPIUM BROMIDE 0.02 % IN SOLN
2.5000 mL | Freq: Four times a day (QID) | RESPIRATORY_TRACT | Status: DC
  Administered 2019-01-31 (×3): 0.5 mg via RESPIRATORY_TRACT
  Filled 2019-01-30 (×3): qty 2.5

## 2019-01-30 MED ORDER — FUROSEMIDE 10 MG/ML IJ SOLN
40.0000 mg | Freq: Once | INTRAMUSCULAR | Status: AC
  Administered 2019-01-30: 40 mg via INTRAVENOUS
  Filled 2019-01-30: qty 4

## 2019-01-30 MED ORDER — MIRTAZAPINE 15 MG PO TABS
15.0000 mg | ORAL_TABLET | Freq: Every day | ORAL | Status: DC
  Administered 2019-01-31 – 2019-02-01 (×3): 15 mg via ORAL
  Filled 2019-01-30 (×3): qty 1

## 2019-01-30 NOTE — ED Notes (Signed)
Patient given sandwich tray and water at this time.  

## 2019-01-30 NOTE — ED Triage Notes (Signed)
PT to ED via [facility] from home with c/o swelling in legs and abdomen. Pt with exertional hypoxia, and c/o shortness of breath.Pt was walking to ems stretcher at home when pt became very short of breath. Pt was placed on CPAP by ems as result.  Increased lasix from 20-40 2 weeks ago. Hx of CHF. Pt alert and oriented x4. Denies chest pain. 1 spray nitro in route.

## 2019-01-30 NOTE — H&P (Addendum)
[facility] [place] NAME: [name]    MR#:  [id number]  DATE OF BIRTH:  09-Jan-1961  DATE OF ADMISSION:  01/30/2019  PRIMARY CARE PHYSICIAN: Medicine, [employer]   REQUESTING/REFERRING PHYSICIAN: Duffy Bruce, MD  CHIEF COMPLAINT:   Chief Complaint  Patient presents with  . Shortness of Breath    HISTORY OF PRESENT ILLNESS:  58 y.o. 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  Patient states his been having shortness of breath and bilateral lower extremity edema since discharge from the hospital.  He report that he has been out of some of his medication due to cost including gabapentin, potassium and Lasix.  He has bilateral lower extremities edema left greater than right and tightness in his stomach. Patient describes associated symptoms of nausea without vomiting, dyspnea on exertion, cough, and early satiety.  Denies fevers or chills, chest pain or any other symptoms.  He admits that he continues to smoke but has been trying to quit without success.  On arrival to the ED, he was afebrile with blood pressure 121/78 mm Hg and pulse rate 74 beats/min. There were no focal neurological deficits; he was alert and oriented x4, and very dyspneic so was placed on CPAP now has been weaned to [place] at 4 L via nasal cannula.  Initial labs revealed potassium 5.5, BUN 27, creatinine 1.27, AST 90 ALT 64, bili 1.7, BNP 1234, troponin 28, Covid 19 Negative.  Chest x-ray showed small right pleural effusion and associated atelectasis or consolidation.  And received IV Lasix in the ED with significant improvement in his breathing.  He will be admitted under  hospitalist service for further management.  PAST MEDICAL HISTORY:   Past Medical History:  Diagnosis Date  . Alcohol abuse   . Bipolar 1 disorder ([place])   . CHF (congestive heart failure) ([place])   . COPD (chronic obstructive pulmonary disease) ([place])   . Hip pain   . Hypertension   . Pulmonary HTN ([place])   . Valvular heart disease     PAST SURGICAL HISTORY:   Past Surgical History:  Procedure Laterality Date  . AORTIC VALVE REPLACEMENT      SOCIAL HISTORY:   Social History   Tobacco Use  . Smoking status: Current Every Day Smoker    Packs/day: 1.00    Types: Cigarettes  . Smokeless tobacco: Never Used  Substance Use Topics  . Alcohol use: Yes    FAMILY HISTORY:  History reviewed. No pertinent family history.  DRUG ALLERGIES:  No Known Allergies  REVIEW OF SYSTEMS:   Review of Systems  Constitutional: Negative for chills, fever, malaise/fatigue and weight loss.  HENT: Negative for congestion, hearing loss and sore throat.   Eyes: Negative for blurred vision and double vision.  Respiratory: Positive for cough and shortness of breath. Negative for wheezing.   Cardiovascular: Positive for orthopnea and leg swelling. Negative for chest pain and palpitations.  Gastrointestinal: Negative for abdominal pain, diarrhea, nausea and vomiting.  Genitourinary: Negative for dysuria and urgency.  Musculoskeletal: Negative for myalgias.  Skin: Negative for rash.  Neurological: Positive for sensory change. Negative for dizziness, speech change, focal weakness and headaches.  Psychiatric/Behavioral: Positive for depression.   MEDICATIONS AT HOME:   Prior  to Admission medications   Medication Sig Start Date End Date Taking? Authorizing Provider  atorvastatin (LIPITOR) 40 MG tablet Take 40 mg by mouth daily. 10/30/18 10/30/19 Yes [provider]  azithromycin (ZITHROMAX) 250 MG tablet Take as directed on package. 01/28/19  Yes Clarisa KindredHackney, Tina A, FNP  FLUoxetine (PROZAC) 20  MG capsule Take 20 mg by mouth every morning. 08/26/18  Yes [provider]  furosemide (LASIX) 40 MG tablet Take 1 tablet (40 mg total) by mouth daily. 01/28/19 01/28/20 Yes Hackney, Inetta Fermoina A, FNP  gabapentin (NEURONTIN) 300 MG capsule Take 300 mg by mouth 3 (three) times daily. 04/14/18 04/14/19 Yes [provider]  ipratropium (ATROVENT HFA) 17 MCG/ACT inhaler Inhale 2 puffs into the lungs 4 (four) times daily. 01/27/19 01/27/20 Yes [provider]  meloxicam (MOBIC) 15 MG tablet Take 15 mg by mouth daily. 10/30/18  Yes [provider]  metoprolol tartrate (LOPRESSOR) 25 MG tablet Take 25 mg by mouth daily. 08/26/18  Yes [provider]  mirtazapine (REMERON) 15 MG tablet Take 15 mg by mouth at bedtime. 09/08/18 09/08/19 Yes [provider]  potassium chloride 20 MEQ TBCR Take 20 mEq by mouth daily. 01/28/19  Yes Hackney, Inetta Fermoina A, FNP  acetaminophen (TYLENOL) 500 MG tablet Take 1,000 mg by mouth every 8 (eight) hours. 10/30/18 10/30/19  [provider]  albuterol (VENTOLIN HFA) 108 (90 Base) MCG/ACT inhaler Inhale 2 puffs into the lungs every 4 (four) hours as needed for wheezing or shortness of breath. 01/21/19   Shaune PollackIsaacs, Cameron, MD  aspirin 325 MG tablet Take 325 mg by mouth daily.    [provider]  DULoxetine (CYMBALTA) 30 MG capsule Take 30 mg by mouth daily. 09/08/18   [provider]  naproxen (NAPROSYN) 500 MG tablet Take 500 mg by mouth 2 (two) times daily with a meal.    [provider]      VITAL SIGNS:  Blood pressure 107/61, pulse 90, temperature 97.9 F (36.6 C), temperature source Oral, resp. rate (!) 28, height 5\' 8"  (1.727 m), weight 71.7 kg, SpO2 98 %.  PHYSICAL EXAMINATION:   Physical Exam  GENERAL:  58 y.o.-year-old patient lying in the bed with no acute distress.  EYES: Pupils equal, round, reactive to light and accommodation. No scleral icterus. Extraocular muscles intact.  HEENT: Head  atraumatic, normocephalic. Oropharynx and nasopharynx clear.  NECK:  Supple, no jugular venous distention. No thyroid enlargement, no tenderness.  LUNGS: decreased breath sounds bilaterally, no wheezing, rales,rhonchi or crepitation. No use of accessory muscles of respiration.  CARDIOVASCULAR: S1, S2 normal. Murmur present, rubs, or gallops.  ABDOMEN: Soft, nontender, nondistended. Bowel sounds present. No organomegaly or mass.  EXTREMITIES: Edema lower extremity left > right, No cyanosis. Clubbing of fingers noted NEUROLOGIC: Cranial nerves II through XII are intact. Muscle strength 5/5 in all extremities. Sensation intact. Gait not checked.  PSYCHIATRIC: The patient is alert and oriented x 3.  SKIN: No obvious rash, lesion, or ulcer.   DATA REVIEWED:  LABORATORY PANEL:   CBC Recent Labs  Lab 01/30/19 1600  WBC 7.1  HGB 12.0*  HCT 37.3*  PLT 216   ------------------------------------------------------------------------------------------------------------------  Chemistries  Recent Labs  Lab 01/30/19 1600  NA 136  K 5.5*  CL 103  CO2 22  GLUCOSE 98  BUN 27*  CREATININE 1.29*  CALCIUM 8.7*  AST 90*  ALT 64*  ALKPHOS 121  BILITOT 1.7*   ------------------------------------------------------------------------------------------------------------------  Cardiac Enzymes No results for input(s): TROPONINI in the  last 168 hours. ------------------------------------------------------------------------------------------------------------------  RADIOLOGY:  Dg Chest Portable 1 View  Result Date: 01/30/2019 CLINICAL DATA:  Shortness of breath EXAM: PORTABLE CHEST 1 VIEW COMPARISON:  01/21/2019 FINDINGS: No significant interval change in AP portable examination with small right pleural effusion and associated atelectasis or consolidation. The left lung is normally aerated. Cardiomegaly status post median sternotomy. IMPRESSION: 1. No significant interval change in AP portable  examination with small right pleural effusion and associated atelectasis or consolidation. The left lung is normally aerated. 2.  Cardiomegaly status post median sternotomy. Electronically Signed   By: Lauralyn Primes M.D.   On: 01/30/2019 16:41    EKG:  EKG: Sinus rhythm, ventricular rate 97.  Nonspecific ST changes, no significant change from prior.  No overt ST elevations or depressions.  IMPRESSION AND PLAN:   58 y.o. 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 Acute on chronic Diastolic Congestive Heart Failure: Acute presentation likely due to volume overload with associated symptoms of SOB, BLE edema and. BNP elevated at 1234 Hx: Dilated cardiomyopathy, mitral regurgitation, pulmonary hypertension - Chest x-ray shows small right pleural effusion and associated atelectasis or consolidation   Last Echo 01/07/2019 , EF 50 to 55% - Continue Metoprolol - Furosemide 40mg  IV BID. Diureses >1L negative per day until approach euvolemia / worsening renal function. - Low salt diet  - Check daily weight - Strict I&Os - Cardiology Consult  2. Acute on chronic COPD Exacerbation - Supplemental O2, goal sat 88-92% - Bronchodilators (albuterol/ipratropium) standing and PRN  - Corticosteroids: IV steroids 40 mg Q12 - Continue PO azithromycin  3. AKI with Hyperkalemia - Hold potassium sparing meds - Hold nephrotoxins - Monitor renal functions - Recheck in am + mag  4. Tobacco abuse - Smoking cessation counseling - Continue patch provided  5. Hypertension - Continue metoprolol  6. Hyperlipidemia -continue atorvastatin 40 mg qhs   7. Depressive bipolar disorder - Continue Remeron, Prozac and Cymbalta  DVT prophylaxis with heparin    All the records are reviewed and case discussed with ED provider. Management plans discussed with the patient, family and they are in agreement.  CODE STATUS:   TOTAL TIME TAKING CARE OF THIS PATIENT: 50 minutes.    on 01/30/2019 at 11:22 PM   02/01/2019, DNP, FNP-BC [facility] Nurse Practitioner Between 7am to 6pm - Pager [phone]  After 6pm go to [email or web address] - - [phone]  [facility]  Office  [phone]  CC: Primary care physician; Medicine, [facility] [employer] [facility] [employer] [facility]

## 2019-01-30 NOTE — ED Provider Notes (Signed)
[facility] Emergency Department Provider Note  ____________________________________________   First MD Initiated Contact with Patient 01/30/19 1556     (approximate)  I have reviewed the triage vital signs and the nursing notes.   HISTORY  Chief Complaint Shortness of Breath    HPI [name] is a 58 y.o. male  With h/o bipolar d/o, COPD, HTN, pulmonary HTN, MV regurgitation, dilated CM here with SOB. Pt has been seen multiple times in the last week by his PCP, myself, as well as heart failure clinic.  He was initiated on Lasix approximately 10 days ago, and this was increased at CHF clinic.  He states that he has been taking it, and has been trying to limit his fluid intake.  However, he has had persistent and gradually worsening shortness of breath.  This is worsened over the just the last several days, and he states that he is having now extreme shortness of breath with difficulty even talking.  He called EMS due to acute worsening of his shortness of breath today, and was placed on BiPAP due to severe dyspnea and hypoxia.  He states he now feels somewhat improved.  Denies any fevers.  He has had some occasional sputum production.  No chest pain.        Past Medical History:  Diagnosis Date  . Alcohol abuse   . Bipolar 1 disorder ([place])   . CHF (congestive heart failure) ([place])   . COPD (chronic obstructive pulmonary disease) ([place])   . Hip pain   . Hypertension   . Pulmonary HTN ([place])   . Valvular heart disease     Patient Active Problem List   Diagnosis Date Noted  . CHF (congestive heart failure) ([place]) 01/30/2019  . Pleural effusion 01/06/2019  . Mitral regurgitation 01/06/2019  . Pulmonary hypertension ([place]) 01/06/2019  . COPD (chronic obstructive pulmonary disease) ([place]) 01/06/2019  . Bipolar 1 disorder ([place]) 01/06/2019    Past Surgical History:  Procedure Laterality Date  . AORTIC VALVE REPLACEMENT      Prior to Admission  medications   Medication Sig Start Date End Date Taking? Authorizing Provider  atorvastatin (LIPITOR) 40 MG tablet Take 40 mg by mouth daily. 10/30/18 10/30/19 Yes [provider]  azithromycin (ZITHROMAX) 250 MG tablet Take as directed on package. 01/28/19  Yes Darylene Price A, FNP  FLUoxetine (PROZAC) 20 MG capsule Take 20 mg by mouth every morning. 08/26/18  Yes [provider]  furosemide (LASIX) 40 MG tablet Take 1 tablet (40 mg total) by mouth daily. 01/28/19 01/28/20 Yes Hackney, [name] A, FNP  gabapentin (NEURONTIN) 300 MG capsule Take 300 mg by mouth 3 (three) times daily. 04/14/18 04/14/19 Yes [provider]  ipratropium (ATROVENT HFA) 17 MCG/ACT inhaler Inhale 2 puffs into the lungs 4 (four) times daily. 01/27/19 01/27/20 Yes [provider]  meloxicam (MOBIC) 15 MG tablet Take 15 mg by mouth daily. 10/30/18  Yes [provider]  metoprolol tartrate (LOPRESSOR) 25 MG tablet Take 25 mg by mouth daily. 08/26/18  Yes [provider]  mirtazapine (REMERON) 15 MG tablet Take 15 mg by mouth at bedtime. 09/08/18 09/08/19 Yes [provider]  potassium chloride 20 MEQ TBCR Take 20 mEq by mouth daily. 01/28/19  Yes Hackney, [name] A, FNP  acetaminophen (TYLENOL) 500 MG tablet Take 1,000 mg by mouth every 8 (eight) hours. 10/30/18 10/30/19  [provider]  albuterol (VENTOLIN HFA) 108 (90 Base) MCG/ACT inhaler Inhale 2 puffs into the  lungs every 4 (four) hours as needed for wheezing or shortness of breath. 01/21/19   Shaune Pollack, MD  aspirin 325 MG tablet Take 325 mg by mouth daily.    [provider]  DULoxetine (CYMBALTA) 30 MG capsule Take 30 mg by mouth daily. 09/08/18   [provider]  naproxen (NAPROSYN) 500 MG tablet Take 500 mg by mouth 2 (two) times daily with a meal.    [provider]    Allergies Patient has no known allergies.  History reviewed. No pertinent family history.  Social History  Social History   Tobacco Use  . Smoking status: Current Every Day Smoker    Packs/day: 1.00    Types: Cigarettes  . Smokeless tobacco: Never Used  Substance Use Topics  . Alcohol use: Yes  . Drug use: Yes    Types: Cocaine    Review of Systems  Review of Systems  Constitutional: Positive for fatigue. Negative for chills and fever.  HENT: Negative for sore throat.   Respiratory: Positive for cough and shortness of breath.   Cardiovascular: Positive for leg swelling. Negative for chest pain.  Gastrointestinal: Negative for abdominal pain.  Genitourinary: Negative for flank pain.  Musculoskeletal: Negative for neck pain.  Skin: Negative for rash and wound.  Allergic/Immunologic: Negative for immunocompromised state.  Neurological: Positive for weakness. Negative for numbness.  Hematological: Does not bruise/bleed easily.  All other systems reviewed and are negative.    ____________________________________________  PHYSICAL EXAM:      VITAL SIGNS: ED Triage Vitals  Enc Vitals Group     BP 01/30/19 1552 124/86     Pulse Rate 01/30/19 1559 97     Resp 01/30/19 1559 19     Temp 01/30/19 1600 97.9 F (36.6 C)     Temp Source 01/30/19 1600 Oral     SpO2 01/30/19 1556 100 %     Weight 01/30/19 1557 158 lb (71.7 kg)     Height 01/30/19 1557 5\' 8"  (1.727 m)     Head Circumference --      Peak Flow --      Pain Score 01/30/19 1556 0     Pain Loc --      Pain Edu? --      Excl. in GC? --      Physical Exam Vitals signs and nursing note reviewed.  Constitutional:      General: He is not in acute distress.    Appearance: He is well-developed.  HENT:     Head: Normocephalic and atraumatic.  Eyes:     Conjunctiva/sclera: Conjunctivae normal.  Neck:     Musculoskeletal: Neck supple.     Vascular: JVD present.  Cardiovascular:     Rate and Rhythm: Normal rate and regular rhythm.     Heart sounds: Normal heart sounds.  Pulmonary:     Effort: Tachypnea and respiratory  distress present.     Breath sounds: Examination of the right-lower field reveals rales. Examination of the left-lower field reveals rales. Rales present. No wheezing.  Abdominal:     General: There is no distension.  Musculoskeletal:     Right lower leg: Edema present.     Left lower leg: Edema present.  Skin:    General: Skin is warm.     Capillary Refill: Capillary refill takes less than 2 seconds.     Findings: No rash.  Neurological:     Mental Status: He is alert and oriented to person, place, and time.  Motor: No abnormal muscle tone.       ____________________________________________   LABS (all labs ordered are listed, but only abnormal results are displayed)  Labs Reviewed  CBC WITH DIFFERENTIAL/PLATELET - Abnormal; Notable for the following components:      Result Value   Hemoglobin 12.0 (*)    HCT 37.3 (*)    MCH 25.9 (*)    RDW 16.0 (*)    nRBC 0.3 (*)    All other components within normal limits  COMPREHENSIVE METABOLIC PANEL - Abnormal; Notable for the following components:   Potassium 5.5 (*)    BUN 27 (*)    Creatinine, Ser 1.29 (*)    Calcium 8.7 (*)    AST 90 (*)    ALT 64 (*)    Total Bilirubin 1.7 (*)    All other components within normal limits  BRAIN NATRIURETIC PEPTIDE - Abnormal; Notable for the following components:   B Natriuretic Peptide 1,234.0 (*)    All other components within normal limits  BLOOD GAS, VENOUS - Abnormal; Notable for the following components:   pH, Ven 7.46 (*)    pCO2, Ven 33 (*)    pO2, Ven 62.0 (*)    All other components within normal limits  TROPONIN I (HIGH SENSITIVITY) - Abnormal; Notable for the following components:   Troponin I (High Sensitivity) 28 (*)    All other components within normal limits  SARS CORONAVIRUS 2 (TAT 6-24 HRS)  BASIC METABOLIC PANEL  TROPONIN I (HIGH SENSITIVITY)    ____________________________________________  EKG: Sinus rhythm, ventricular rate 97.  Nonspecific ST changes, no  significant change from prior.  No overt ST elevations or depressions. ________________________________________  RADIOLOGY All imaging, including plain films, CT scans, and ultrasounds, independently reviewed by me, and interpretations confirmed via formal radiology reads.  ED MD interpretation:   CXR: Cardiomegaly, small R pleural effusion w/ atelectasis  Official radiology report(s): Dg Chest Portable 1 View  Result Date: 01/30/2019 CLINICAL DATA:  Shortness of breath EXAM: PORTABLE CHEST 1 VIEW COMPARISON:  01/21/2019 FINDINGS: No significant interval change in AP portable examination with small right pleural effusion and associated atelectasis or consolidation. The left lung is normally aerated. Cardiomegaly status post median sternotomy. IMPRESSION: 1. No significant interval change in AP portable examination with small right pleural effusion and associated atelectasis or consolidation. The left lung is normally aerated. 2.  Cardiomegaly status post median sternotomy. Electronically Signed   By: Lauralyn PrimesAlex  Bibbey M.D.   On: 01/30/2019 16:41    ____________________________________________  PROCEDURES   Procedure(s) performed (including Critical Care):  .Critical Care Performed by: Shaune PollackIsaacs, Clovia Reine, MD Authorized by: Shaune PollackIsaacs, Jozlyn Schatz, MD   Critical care provider statement:    Critical care time (minutes):  35   Critical care time was exclusive of:  Separately billable procedures and treating other patients and teaching time   Critical care was necessary to treat or prevent imminent or life-threatening deterioration of the following conditions:  Cardiac failure, circulatory failure and respiratory failure   Critical care was time spent personally by me on the following activities:  Development of treatment plan with patient or surrogate, discussions with consultants, evaluation of patient's response to treatment, examination of patient, obtaining history from patient or surrogate, ordering  and performing treatments and interventions, ordering and review of laboratory studies, ordering and review of radiographic studies, pulse oximetry, re-evaluation of patient's condition and review of old charts   I assumed direction of critical care for this patient from another provider  in my specialty: no      ____________________________________________  INITIAL IMPRESSION / MDM / ASSESSMENT AND PLAN / ED COURSE  As part of my medical decision making, I reviewed the following data within the electronic MEDICAL RECORD NUMBER Notes from prior ED visits and [place] Controlled Substance Database      *METE PURDUM was evaluated in Emergency Department on 01/31/2019 for the symptoms described in the history of present illness. He was evaluated in the context of the global COVID-19 pandemic, which necessitated consideration that the patient might be at risk for infection with the SARS-CoV-2 virus that causes COVID-19. Institutional protocols and algorithms that pertain to the evaluation of patients at risk for COVID-19 are in a state of rapid change based on information released by regulatory bodies including the CDC and federal and state organizations. These policies and algorithms were followed during the patient's care in the ED.  Some ED evaluations and interventions may be delayed as a result of limited staffing during the pandemic.*      Medical Decision Making:  58 yo M here with respiratory distress, arrives on BIPAP. H/o dilated CM and mitral valve disease. Suspect acute hypoxic resp failure 2/2 ongoing CHF exacerbation. Unlikely PNA in absence of any fever or infectious sx. DDx includes worsening MVR. Following IV Lasix, able ot wean pt to  and will plan to admit. Trop mildly elevated - EKG is nonischemic and I suspect this is demand in setting of his CHF exacerbation.  ____________________________________________  FINAL CLINICAL IMPRESSION(S) / ED DIAGNOSES  Final diagnoses:  Acute on  chronic systolic congestive heart failure (HCC)     MEDICATIONS GIVEN DURING THIS VISIT:  Medications  acetaminophen (TYLENOL) tablet 1,000 mg (1,000 mg Oral Given 01/31/19 0012)  aspirin EC tablet 325 mg (325 mg Oral Given 01/31/19 0013)  meloxicam (MOBIC) tablet 15 mg (15 mg Oral Given 01/31/19 0014)  naproxen (NAPROSYN) tablet 500 mg (has no administration in time range)  atorvastatin (LIPITOR) tablet 40 mg (has no administration in time range)  metoprolol tartrate (LOPRESSOR) tablet 25 mg (has no administration in time range)  FLUoxetine (PROZAC) capsule 20 mg (has no administration in time range)  mirtazapine (REMERON) tablet 15 mg (15 mg Oral Given 01/31/19 0013)  gabapentin (NEURONTIN) capsule 300 mg (300 mg Oral Given 01/31/19 0013)  albuterol (PROVENTIL) (2.5 MG/3ML) 0.083% nebulizer solution 2.5 mg (has no administration in time range)  ipratropium (ATROVENT) nebulizer solution 0.5 mg (0.5 mg Inhalation Given 01/31/19 0014)  sodium chloride flush (NS) 0.9 % injection 3 mL (has no administration in time range)  sodium chloride flush (NS) 0.9 % injection 3 mL (has no administration in time range)  0.9 %  sodium chloride infusion (has no administration in time range)  acetaminophen (TYLENOL) tablet 650 mg (has no administration in time range)  ondansetron (ZOFRAN) injection 4 mg (has no administration in time range)  heparin injection 5,000 Units (5,000 Units Subcutaneous Given 01/31/19 0014)  furosemide (LASIX) injection 40 mg (has no administration in time range)  azithromycin (ZITHROMAX) tablet 500 mg (has no administration in time range)  ipratropium-albuterol (DUONEB) 0.5-2.5 (3) MG/3ML nebulizer solution 3 mL (3 mLs Nebulization Given 01/30/19 1604)  furosemide (LASIX) injection 40 mg (40 mg Intravenous Given 01/30/19 1724)     ED Discharge Orders    None       Note:  This document was prepared using Dragon voice recognition software and may include unintentional  dictation errors.   Shaune Pollack, MD 01/31/19  0138  

## 2019-01-31 ENCOUNTER — Inpatient Hospital Stay: Payer: Medicaid Other

## 2019-01-31 LAB — BASIC METABOLIC PANEL
Anion gap: 11 (ref 5–15)
BUN: 35 mg/dL — ABNORMAL HIGH (ref 6–20)
CO2: 25 mmol/L (ref 22–32)
Calcium: 8.7 mg/dL — ABNORMAL LOW (ref 8.9–10.3)
Chloride: 104 mmol/L (ref 98–111)
Creatinine, Ser: 1.48 mg/dL — ABNORMAL HIGH (ref 0.61–1.24)
GFR calc Af Amer: 60 mL/min — ABNORMAL LOW (ref >60)
GFR calc non Af Amer: 51 mL/min — ABNORMAL LOW (ref >60)
Glucose, Bld: 145 mg/dL — ABNORMAL HIGH (ref 70–99)
Potassium: 4.3 mmol/L (ref 3.5–5.1)
Sodium: 140 mmol/L (ref 135–145)

## 2019-01-31 LAB — TROPONIN I (HIGH SENSITIVITY): Troponin I (High Sensitivity): 37 ng/L — ABNORMAL HIGH (ref <18)

## 2019-01-31 MED ORDER — PNEUMOCOCCAL VAC POLYVALENT 25 MCG/0.5ML IJ INJ
0.5000 mL | INJECTION | INTRAMUSCULAR | Status: AC
  Administered 2019-02-07: 0.5 mL via INTRAMUSCULAR
  Filled 2019-01-31: qty 0.5

## 2019-01-31 MED ORDER — SALINE SPRAY 0.65 % NA SOLN
1.0000 | NASAL | Status: DC | PRN
  Filled 2019-01-31 (×2): qty 44

## 2019-01-31 MED ORDER — IPRATROPIUM-ALBUTEROL 0.5-2.5 (3) MG/3ML IN SOLN
3.0000 mL | Freq: Four times a day (QID) | RESPIRATORY_TRACT | Status: DC
  Administered 2019-01-31 – 2019-02-04 (×13): 3 mL via RESPIRATORY_TRACT
  Filled 2019-01-31 (×14): qty 3

## 2019-01-31 MED ORDER — ORAL CARE MOUTH RINSE
15.0000 mL | Freq: Two times a day (BID) | OROMUCOSAL | Status: DC
  Administered 2019-01-31 – 2019-02-07 (×11): 15 mL via OROMUCOSAL

## 2019-01-31 MED ORDER — AZITHROMYCIN 250 MG PO TABS
500.0000 mg | ORAL_TABLET | Freq: Every day | ORAL | Status: DC
  Administered 2019-01-31: 500 mg via ORAL
  Filled 2019-01-31: qty 2

## 2019-01-31 NOTE — Plan of Care (Signed)
  Problem: Education: Goal: Knowledge of General Education information will improve Description: Including pain rating scale, medication(s)/side effects and non-pharmacologic comfort measures Outcome: Progressing   Problem: Clinical Measurements: Goal: Respiratory complications will improve Outcome: Progressing   Problem: Education: Goal: Ability to demonstrate management of disease process will improve Outcome: Progressing   

## 2019-01-31 NOTE — ED Notes (Signed)
Patient oxygen decreased to 2l. Patient oxygen saturations 99%.

## 2019-01-31 NOTE — ED Notes (Signed)
ED TO INPATIENT HANDOFF REPORT  ED Nurse Name and Phone #: Selinda Eon [phone]  S Name/Age/Gender [name] 58 y.o. male Room/Bed: ED02A/ED02A  Code Status   Code Status: Full Code  Home/SNF/Other Home Patient oriented to: self, place, time and situation Is this baseline? No   Triage Complete: Triage complete  Chief Complaint SOB  Triage Note PT to ED via [facility] from home with c/o swelling in legs and abdomen. Pt with exertional hypoxia, and c/o shortness of breath.Pt was walking to ems stretcher at home when pt became very short of breath. Pt was placed on CPAP by ems as result.  Increased lasix from 20-40 2 weeks ago. Hx of CHF. Pt alert and oriented x4. Denies chest pain. 1 spray nitro in route.    Allergies No Known Allergies  Level of Care/Admitting Diagnosis ED Disposition    ED Disposition Condition [facility] Area: [place] [[id number]]  Level of Care: Telemetry [5]  Covid Evaluation: Asymptomatic Screening Protocol (No Symptoms)  Diagnosis: CHF (congestive heart failure) [facility]) [[id number]]  Admitting Physician: Eula Flax  Attending Physician: Rufina Falco ACHIENG [phone]  Estimated length of stay: past midnight tomorrow  Certification:: I certify this patient will need inpatient services for at least 2 midnights  PT Class (Do Not Modify): Inpatient [101]  PT Acc Code (Do Not Modify): Private [1]       B Medical/Surgery History Past Medical History:  Diagnosis Date  . Alcohol abuse   . Bipolar 1 disorder ([place])   . CHF (congestive heart failure) ([place])   . COPD (chronic obstructive pulmonary disease) ([place])   . Hip pain   . Hypertension   . Pulmonary HTN ([place])   . Valvular heart disease    Past Surgical History:  Procedure Laterality Date  . AORTIC VALVE REPLACEMENT       A IV Location/Drains/Wounds Patient Lines/Drains/Airways Status   Active Line/Drains/Airways    None           Intake/Output Last 24 hours  Intake/Output Summary (Last 24 hours) at 01/31/2019 0417 Last data filed at 01/31/2019 0021 Gross per 24 hour  Intake -  Output 1700 ml  Net -1700 ml    Labs/Imaging Results for orders placed or performed during the hospital encounter of 01/30/19 (from the past 48 hour(s))  Blood gas, venous     Status: Abnormal   Collection Time: 01/30/19  3:59 PM  Result Value Ref Range   pH, Ven 7.46 (H) 7.250 - 7.430   pCO2, Ven 33 (L) 44.0 - 60.0 mmHg   pO2, Ven 62.0 (H) 32.0 - 45.0 mmHg   Bicarbonate 23.5 20.0 - 28.0 mmol/L   Acid-Base Excess 0.3 0.0 - 2.0 mmol/L   O2 Saturation 92.7 %   Patient temperature 37.0    Collection site VENOUS    Sample type VENOUS     Comment: Performed at [facility], [address] [place], [place] [address]  CBC with Differential     Status: Abnormal   Collection Time: 01/30/19  4:00 PM  Result Value Ref Range   WBC 7.1 4.0 - 10.5 K/uL   RBC 4.64 4.22 - 5.81 MIL/uL   Hemoglobin 12.0 (L) 13.0 - 17.0 g/dL   HCT 37.3 (L) 39.0 - 52.0 %   MCV 80.4 80.0 - 100.0 fL   MCH 25.9 (L) 26.0 - 34.0 pg   MCHC 32.2 30.0 - 36.0 g/dL   RDW 16.0 (H)  11.5 - 15.5 %   Platelets 216 150 - 400 K/uL   nRBC 0.3 (H) 0.0 - 0.2 %   Neutrophils Relative % 62 %   Neutro Abs 4.5 1.7 - 7.7 K/uL   Lymphocytes Relative 21 %   Lymphs Abs 1.5 0.7 - 4.0 K/uL   Monocytes Relative 14 %   Monocytes Absolute 1.0 0.1 - 1.0 K/uL   Eosinophils Relative 2 %   Eosinophils Absolute 0.1 0.0 - 0.5 K/uL   Basophils Relative 0 %   Basophils Absolute 0.0 0.0 - 0.1 K/uL   Immature Granulocytes 1 %   Abs Immature Granulocytes 0.07 0.00 - 0.07 K/uL    Comment: Performed at [facility], [address] [place], [place] [address]  Comprehensive metabolic panel     Status: Abnormal   Collection Time: 01/30/19  4:00 PM  Result Value Ref Range   Sodium 136 135 - 145 mmol/L   Potassium 5.5 (H) 3.5 - 5.1 mmol/L    Comment: HEMOLYSIS AT THIS  LEVEL MAY AFFECT RESULT   Chloride 103 98 - 111 mmol/L   CO2 22 22 - 32 mmol/L   Glucose, Bld 98 70 - 99 mg/dL   BUN 27 (H) 6 - 20 mg/dL   Creatinine, Ser 1.911.29 (H) 0.61 - 1.24 mg/dL   Calcium 8.7 (L) 8.9 - 10.3 mg/dL   Total Protein 6.9 6.5 - 8.1 g/dL   Albumin 3.5 3.5 - 5.0 g/dL   AST 90 (H) 15 - 41 U/L    Comment: HEMOLYSIS AT THIS LEVEL MAY AFFECT RESULT   ALT 64 (H) 0 - 44 U/L   Alkaline Phosphatase 121 38 - 126 U/L   Total Bilirubin 1.7 (H) 0.3 - 1.2 mg/dL    Comment: HEMOLYSIS AT THIS LEVEL MAY AFFECT RESULT   GFR calc non Af Amer >60 >60 mL/min   GFR calc Af Amer >60 >60 mL/min   Anion gap 11 5 - 15    Comment: Performed at [facility], [address] [place], [place] [address]  Brain natriuretic peptide     Status: Abnormal   Collection Time: 01/30/19  4:00 PM  Result Value Ref Range   B Natriuretic Peptide 1,234.0 (H) 0.0 - 100.0 pg/mL    Comment: Performed at [facility], [address] [place], [place] [address]  Troponin I (High Sensitivity)     Status: Abnormal   Collection Time: 01/30/19  4:00 PM  Result Value Ref Range   Troponin I (High Sensitivity) 28 (H) <18 ng/L    Comment: (NOTE) Elevated high sensitivity troponin I (hsTnI) values and significant  changes across serial measurements may suggest ACS but many other  chronic and acute conditions are known to elevate hsTnI results.  Refer to the "Links" section for chest pain algorithms and additional  guidance. Performed at [facility], [address] [place], [place] [address]   SARS CORONAVIRUS 2 (TAT 6-24 HRS) Nasopharyngeal Nasopharyngeal Swab     Status: None   Collection Time: 01/30/19  4:00 PM   Specimen: Nasopharyngeal Swab  Result Value Ref Range   SARS Coronavirus 2 NEGATIVE NEGATIVE    Comment: (NOTE) SARS-CoV-2 target nucleic acids are NOT DETECTED. The SARS-CoV-2 RNA is generally detectable in upper and lower respiratory specimens during the acute phase of  infection. Negative results do not preclude SARS-CoV-2 infection, do not rule out co-infections with other pathogens, and should not be used as the sole basis for treatment or other  patient management decisions. Negative results must be combined with clinical observations, patient history, and epidemiological information. The expected result is Negative. Fact Sheet for Patients: HairSlick.no Fact Sheet for Healthcare Providers: quierodirigir.com This test is not yet approved or cleared by the [place] FDA and  has been authorized for detection and/or diagnosis of SARS-CoV-2 by FDA under an Emergency Use Authorization (EUA). This EUA will remain  in effect (meaning this test can be used) for the duration of the COVID-19 declaration under Section 56 4(b)(1) of the Act, 21 U.S.C. section 360bbb-3(b)(1), unless the authorization is terminated or revoked sooner. Performed at [facility], [address], [place], [place] [address]    Dg Chest Portable 1 View  Result Date: 01/30/2019 CLINICAL DATA:  Shortness of breath EXAM: PORTABLE CHEST 1 VIEW COMPARISON:  01/21/2019 FINDINGS: No significant interval change in AP portable examination with small right pleural effusion and associated atelectasis or consolidation. The left lung is normally aerated. Cardiomegaly status post median sternotomy. IMPRESSION: 1. No significant interval change in AP portable examination with small right pleural effusion and associated atelectasis or consolidation. The left lung is normally aerated. 2.  Cardiomegaly status post median sternotomy. Electronically Signed   By: Lauralyn Primes M.D.   On: 01/30/2019 16:41    Pending Labs Unresulted Labs (From admission, onward)    Start     Ordered   01/31/19 0500  Basic metabolic panel  Daily,   STAT     01/30/19 2203          Vitals/Pain Today's Vitals   01/31/19 0130 01/31/19 0200 01/31/19 0230  01/31/19 0300  BP: (!) 133/104 (!) 123/53 (!) 116/54 122/77  Pulse: 84 85 87 99  Resp: (!) 25 13 (!) 24 (!) 29  Temp:      TempSrc:      SpO2: 100% 99% 99% 96%  Weight:      Height:      PainSc:        Isolation Precautions No active isolations  Medications Medications  acetaminophen (TYLENOL) tablet 1,000 mg (1,000 mg Oral Given 01/31/19 0012)  aspirin EC tablet 325 mg (325 mg Oral Given 01/31/19 0013)  meloxicam (MOBIC) tablet 15 mg (15 mg Oral Given 01/31/19 0014)  naproxen (NAPROSYN) tablet 500 mg (has no administration in time range)  atorvastatin (LIPITOR) tablet 40 mg (has no administration in time range)  metoprolol tartrate (LOPRESSOR) tablet 25 mg (has no administration in time range)  FLUoxetine (PROZAC) capsule 20 mg (has no administration in time range)  mirtazapine (REMERON) tablet 15 mg (15 mg Oral Given 01/31/19 0013)  gabapentin (NEURONTIN) capsule 300 mg (300 mg Oral Given 01/31/19 0013)  albuterol (PROVENTIL) (2.5 MG/3ML) 0.083% nebulizer solution 2.5 mg (has no administration in time range)  ipratropium (ATROVENT) nebulizer solution 0.5 mg (0.5 mg Inhalation Given 01/31/19 0014)  sodium chloride flush (NS) 0.9 % injection 3 mL (has no administration in time range)  sodium chloride flush (NS) 0.9 % injection 3 mL (has no administration in time range)  0.9 %  sodium chloride infusion (has no administration in time range)  acetaminophen (TYLENOL) tablet 650 mg (has no administration in time range)  ondansetron (ZOFRAN) injection 4 mg (has no administration in time range)  heparin injection 5,000 Units (5,000 Units Subcutaneous Given 01/31/19 0014)  furosemide (LASIX) injection 40 mg (has no administration in time range)  azithromycin (ZITHROMAX) tablet 500 mg (has no administration in time range)  ipratropium-albuterol (DUONEB) 0.5-2.5 (3) MG/3ML nebulizer  solution 3 mL (3 mLs Nebulization Given 01/30/19 1604)  furosemide (LASIX) injection 40 mg (40 mg  Intravenous Given 01/30/19 1724)    Mobility walks Low fall risk   Focused Assessments Cardiac Assessment Handoff:    Lab Results  Component Value Date   CKTOTAL 787 (H) 12/19/2013   CKMB 5.9 (H) 12/19/2013   TROPONINI < 0.02 12/24/2013   Lab Results  Component Value Date   DDIMER 2.79 (H) 08/07/2011   Does the Patient currently have chest pain? Yes     R Recommendations: See Admitting Provider Note  Report given to:   Additional Notes:

## 2019-01-31 NOTE — Progress Notes (Addendum)
Tried to perform the ReDs reading on the patient three times, but the reading kept saying low quality. Patient meds sent to pharmacy and lighter in the patients med bin.

## 2019-01-31 NOTE — Progress Notes (Signed)
Patient ID: [name], male   DOB: [birth year], 59 y.o.   MRN: [id number]  [employer] PROGRESS NOTE  BJORN HALLAS XTK:[id number] DOB: Apr 06, 1961 DOA: 01/30/2019 PCP: Medicine, [employer]  HPI/Subjective: Patient feeling better than when he came in.  Came in he was very short of breath.  No complaints of chest pain.  Some abdominal distention.  Objective: Vitals:   01/31/19 0823 01/31/19 1411  BP: 122/65 104/74  Pulse: 81 74  Resp:  15  Temp: 97.7 F (36.5 C) 97.7 F (36.5 C)  SpO2: 100% 100%    Intake/Output Summary (Last 24 hours) at 01/31/2019 1501 Last data filed at 01/31/2019 1300 Gross per 24 hour  Intake 480 ml  Output 2150 ml  Net -1670 ml   Filed Weights   01/30/19 1557 01/31/19 0514  Weight: 71.7 kg 69.9 kg    ROS: Review of Systems  Constitutional: Negative for chills and fever.  Eyes: Negative for blurred vision.  Respiratory: Positive for cough and shortness of breath.   Cardiovascular: Negative for chest pain.  Gastrointestinal: Positive for abdominal pain. Negative for constipation, diarrhea, nausea and vomiting.  Genitourinary: Negative for dysuria.  Musculoskeletal: Negative for joint pain.  Neurological: Negative for dizziness and headaches.   Exam: Physical Exam  Constitutional: He is oriented to person, place, and time.  HENT:  Nose: No mucosal edema.  Mouth/Throat: No oropharyngeal exudate or posterior oropharyngeal edema.  Eyes: Pupils are equal, round, and reactive to light. Conjunctivae, EOM and lids are normal.  Neck: No JVD present. Carotid bruit is not present. No edema present. No thyroid mass and no thyromegaly present.  Cardiovascular: S1 normal and S2 normal. Exam reveals no gallop.  Murmur heard.  Systolic murmur is present with a grade of 3/6. Respiratory: No respiratory distress. He has decreased breath sounds in the right lower field and the left lower field. He has no wheezes. He has no rhonchi. He has rales in the  right lower field and the left lower field.  GI: Soft. Bowel sounds are normal. He exhibits distension. There is abdominal tenderness.  Musculoskeletal:     Right ankle: He exhibits swelling.     Left ankle: He exhibits swelling.  Lymphadenopathy:    He has no cervical adenopathy.  Neurological: He is alert and oriented to person, place, and time. No cranial nerve deficit.  Skin: Skin is warm. No rash noted. Nails show no clubbing.  Psychiatric: He has a normal mood and affect.      Data Reviewed: Basic Metabolic Panel: Recent Labs  Lab 01/30/19 1600 01/31/19 0539  NA 136 140  K 5.5* 4.3  CL 103 104  CO2 22 25  GLUCOSE 98 145*  BUN 27* 35*  CREATININE 1.29* 1.48*  CALCIUM 8.7* 8.7*   Liver Function Tests: Recent Labs  Lab 01/30/19 1600  AST 90*  ALT 64*  ALKPHOS 121  BILITOT 1.7*  PROT 6.9  ALBUMIN 3.5   CBC: Recent Labs  Lab 01/30/19 1600  WBC 7.1  NEUTROABS 4.5  HGB 12.0*  HCT 37.3*  MCV 80.4  PLT 216   BNP (last 3 results) Recent Labs    01/06/19 1922 01/30/19 1600  BNP 1,112.0* 1,234.0*      Recent Results (from the past 240 hour(s))  SARS CORONAVIRUS 2 (TAT 6-24 HRS) Nasopharyngeal Nasopharyngeal Swab     Status: None   Collection Time: 01/30/19  4:00 PM   Specimen: Nasopharyngeal Swab  Result Value Ref Range Status  SARS Coronavirus 2 NEGATIVE NEGATIVE Final    Comment: (NOTE) SARS-CoV-2 target nucleic acids are NOT DETECTED. The SARS-CoV-2 RNA is generally detectable in upper and lower respiratory specimens during the acute phase of infection. Negative results do not preclude SARS-CoV-2 infection, do not rule out co-infections with other pathogens, and should not be used as the sole basis for treatment or other patient management decisions. Negative results must be combined with clinical observations, patient history, and epidemiological information. The expected result is Negative. Fact Sheet for  Patients: HairSlick.nohttps://www.fda.gov/media/138098/download Fact Sheet for Healthcare Providers: quierodirigir.comhttps://www.fda.gov/media/138095/download This test is not yet approved or cleared by the [place] FDA and  has been authorized for detection and/or diagnosis of SARS-CoV-2 by FDA under an Emergency Use Authorization (EUA). This EUA will remain  in effect (meaning this test can be used) for the duration of the COVID-19 declaration under Section 56 4(b)(1) of the Act, 21 U.S.C. section 360bbb-3(b)(1), unless the authorization is terminated or revoked sooner. Performed at [facility], [address] [place], [place] [address]      Studies: Dg Chest Portable 1 View  Result Date: 01/30/2019 CLINICAL DATA:  Shortness of breath EXAM: PORTABLE CHEST 1 VIEW COMPARISON:  01/21/2019 FINDINGS: No significant interval change in AP portable examination with small right pleural effusion and associated atelectasis or consolidation. The left lung is normally aerated. Cardiomegaly status post median sternotomy. IMPRESSION: 1. No significant interval change in AP portable examination with small right pleural effusion and associated atelectasis or consolidation. The left lung is normally aerated. 2.  Cardiomegaly status post median sternotomy. Electronically Signed   By: Lauralyn PrimesAlex  Bibbey M.D.   On: 01/30/2019 16:41    Scheduled Meds: . acetaminophen  1,000 mg Oral Q8H  . aspirin EC  325 mg Oral Daily  . atorvastatin  40 mg Oral Daily  . azithromycin  500 mg Oral Daily  . FLUoxetine  20 mg Oral [id number]  . furosemide  40 mg Intravenous BID  . gabapentin  300 mg Oral TID  . heparin  5,000 Units Subcutaneous Q8H  . ipratropium  2.5 mL Inhalation QID  . mouth rinse  15 mL Mouth Rinse BID  . meloxicam  15 mg Oral Daily  . metoprolol tartrate  25 mg Oral Daily  . mirtazapine  15 mg Oral QHS  . naproxen  500 mg Oral BID WC  . [START ON 02/01/2019] pneumococcal 23 valent vaccine  0.5 mL Intramuscular  Tomorrow-1000  . sodium chloride flush  3 mL Intravenous Q12H   Continuous Infusions: . sodium chloride      Assessment/Plan:  1. Acute diastolic congestive heart failure with moderate mitral regurgitation and severe tricuspid regurgitation.  Diuresis with IV Lasix.  Continue metoprolol. 2. Acute hypoxic respiratory failure.  Try to taper off oxygen.  Patient does not wear oxygen at home. 3. Pulmonary hypertension seen on echocardiogram. 4. Abdominal distention.  We will get an ultrasound of the liver to see if he has cirrhosis and also ultrasound of the abdomen to see if he has ascites.  Also get a flat and upright x-ray. 5. Hyperlipidemia unspecified on Lipitor 6. Chronic kidney disease stage II.  Watch with diuresis 7. Hyperkalemia on presentation.  Watch with diuresis  Code Status:     Code Status Orders  (From admission, onward)         Start     Ordered   01/30/19 2200  Full code  Continuous     01/30/19 2203  Code Status History    Date Active Date Inactive Code Status Order ID Comments User Context   01/07/2019 0231 01/08/2019 1755 Full Code [id number]  Lance Coon, MD Inpatient   08/07/2011 1543 08/08/2011 1513 Full Code [id number]  Ezequiel Essex, MD ED   Advance Care Planning Activity     Family Communication: Patient did not want me to speak with family. Disposition Plan: To be determined  Consultants:  Cardiology  Time spent: 28 minutes  [place]

## 2019-01-31 NOTE — ED Notes (Signed)
Patient given sandwich tray at this time.

## 2019-01-31 NOTE — Consult Note (Signed)
[facility]  Patient ID: Mike MilletMichael L Scull, MRN: [id number], DOB/AGE: 59/12/1960 59 y.o. Admit date: 01/30/2019   Date of Consult: 01/31/2019 Primary Physician: Medicine, [employer] [facility] Cardiologist: [address]  Chief Complaint:  Chief Complaint  Patient presents with  . Shortness of Breath   Reason for Consult: Shortness of breath  HPI: 58 y.o. male with known apparent bipolar disorder essential hypertension and valvular heart disease with aortic valve replacement and mitral valve repair in the past having chronic diastolic dysfunction congestive heart failure with previously appropriate medication management.  The patient has had difficulty with waxing and waning lower extremity edema and pulmonary edema for which the patient has gained several pounds.  He is severely short of breath but no evidence of chest discomfort.  With this he continued to have difficulty despite medication management changes.  When seen in the emergency room he had a chest x-ray showing some minimal pleural effusion but no pulmonary edema BNP was 1234 and troponin of 28.  After intravenous diuresis the patient had significant improvements and has been very comfortable sleeping overnight.  Currently he is hemodynamically stable  Past Medical History:  Diagnosis Date  . Alcohol abuse   . Bipolar 1 disorder (HCC)   . CHF (congestive heart failure) (HCC)   . COPD (chronic obstructive pulmonary disease) (HCC)   . Hip pain   . Hypertension   . Pulmonary HTN (HCC)   . Valvular heart disease       Surgical History:  Past Surgical History:  Procedure Laterality Date  . AORTIC VALVE REPLACEMENT       Home Meds: Prior to Admission medications   Medication Sig Start Date End Date Taking? Authorizing Provider  atorvastatin (LIPITOR) 40 MG tablet Take 40 mg by mouth daily. 10/30/18 10/30/19 Yes [provider]  azithromycin (ZITHROMAX) 250 MG tablet Take as directed on package.  01/28/19  Yes Clarisa KindredHackney, Tina A, FNP  FLUoxetine (PROZAC) 20 MG capsule Take 20 mg by mouth every morning. 08/26/18  Yes [provider]  furosemide (LASIX) 40 MG tablet Take 1 tablet (40 mg total) by mouth daily. 01/28/19 01/28/20 Yes Hackney, Inetta Fermoina A, FNP  gabapentin (NEURONTIN) 300 MG capsule Take 300 mg by mouth 3 (three) times daily. 04/14/18 04/14/19 Yes [provider]  ipratropium (ATROVENT HFA) 17 MCG/ACT inhaler Inhale 2 puffs into the lungs 4 (four) times daily. 01/27/19 01/27/20 Yes [provider]  meloxicam (MOBIC) 15 MG tablet Take 15 mg by mouth daily. 10/30/18  Yes [provider]  metoprolol tartrate (LOPRESSOR) 25 MG tablet Take 25 mg by mouth daily. 08/26/18  Yes [provider]  mirtazapine (REMERON) 15 MG tablet Take 15 mg by mouth at bedtime. 09/08/18 09/08/19 Yes [provider]  potassium chloride 20 MEQ TBCR Take 20 mEq by mouth daily. 01/28/19  Yes Hackney, Inetta Fermoina A, FNP  acetaminophen (TYLENOL) 500 MG tablet Take 1,000 mg by mouth every 8 (eight) hours. 10/30/18 10/30/19  [provider]  albuterol (VENTOLIN HFA) 108 (90 Base) MCG/ACT inhaler Inhale 2 puffs into the lungs every 4 (four) hours as needed for wheezing or shortness of breath. 01/21/19   Shaune PollackIsaacs, Cameron, MD  aspirin 325 MG tablet Take 325 mg by mouth daily.    [provider]  DULoxetine (CYMBALTA) 30 MG capsule Take 30 mg by mouth daily. 09/08/18   [provider]  naproxen (NAPROSYN) 500 MG tablet Take 500 mg by mouth 2 (two) times daily with a  meal.    [provider]    Inpatient Medications:  . acetaminophen  1,000 mg Oral Q8H  . aspirin EC  325 mg Oral Daily  . atorvastatin  40 mg Oral Daily  . azithromycin  500 mg Oral Daily  . FLUoxetine  20 mg Oral BH-[id number]  . furosemide  40 mg Intravenous BID  . gabapentin  300 mg Oral TID  . heparin  5,000 Units Subcutaneous Q8H  . ipratropium  2.5 mL Inhalation QID  . mouth rinse  15 mL  Mouth Rinse BID  . meloxicam  15 mg Oral Daily  . metoprolol tartrate  25 mg Oral Daily  . mirtazapine  15 mg Oral QHS  . naproxen  500 mg Oral BID WC  . [START ON 02/01/2019] pneumococcal 23 valent vaccine  0.5 mL Intramuscular Tomorrow-1000  . sodium chloride flush  3 mL Intravenous Q12H   . sodium chloride      Allergies: No Known Allergies  Social History   Socioeconomic History  . Marital status: Single    Spouse name: Not on file  . Number of children: Not on file  . Years of education: Not on file  . Highest education level: Not on file  Occupational History  . Occupation: disabled  Social Needs  . Financial resource strain: Not on file  . Food insecurity    Worry: Not on file    Inability: Not on file  . Transportation needs    Medical: Not on file    Non-medical: Not on file  Tobacco Use  . Smoking status: Current Every Day Smoker    Packs/day: 1.00    Types: Cigarettes  . Smokeless tobacco: Never Used  Substance and Sexual Activity  . Alcohol use: Yes  . Drug use: Yes    Types: Cocaine  . Sexual activity: Not on file  Lifestyle  . Physical activity    Days per week: Not on file    Minutes per session: Not on file  . Stress: Not on file  Relationships  . Social Musician on phone: Not on file    Gets together: Not on file    Attends religious service: Not on file    Active member of club or organization: Not on file    Attends meetings of clubs or organizations: Not on file    Relationship status: Not on file  . Intimate partner violence    Fear of current or ex partner: Not on file    Emotionally abused: Not on file    Physically abused: Not on file    Forced sexual activity: Not on file  Other Topics Concern  . Not on file  Social History Narrative  . Not on file     History reviewed. No pertinent family history.   Review of Systems Positive for shortness of breath PND orthopnea Negative for: General:  chills, fever, night  sweats or weight changes.  Cardiovascular: Positive for   PND orthopnea negative for syncope dizziness  Dermatological skin lesions rashes Respiratory: Cough congestion Urologic: Frequent urination urination at night and hematuria Abdominal: negative for nausea, vomiting, diarrhea, bright red blood per rectum, melena, or hematemesis Neurologic: negative for visual changes, and/or hearing changes  All other systems reviewed and are otherwise negative except as noted above.  Labs: No results for input(s): CKTOTAL, CKMB, TROPONINI in the last 72 hours. Lab Results  Component Value Date   WBC 7.1 01/30/2019   HGB 12.0 (  L) 01/30/2019   HCT 37.3 (L) 01/30/2019   MCV 80.4 01/30/2019   PLT 216 01/30/2019    Recent Labs  Lab 01/30/19 1600 01/31/19 0539  NA 136 140  K 5.5* 4.3  CL 103 104  CO2 22 25  BUN 27* 35*  CREATININE 1.29* 1.48*  CALCIUM 8.7* 8.7*  PROT 6.9  --   BILITOT 1.7*  --   ALKPHOS 121  --   ALT 64*  --   AST 90*  --   GLUCOSE 98 145*   No results found for: CHOL, HDL, LDLCALC, TRIG Lab Results  Component Value Date   DDIMER 2.79 (H) 08/07/2011    Radiology/Studies:  Dg Chest 2 View  Result Date: 01/21/2019 CLINICAL DATA:  Dyspnea for few weeks with left lower extremity swelling EXAM: CHEST - 2 VIEW COMPARISON:  01/08/2019 chest radiograph. FINDINGS: Intact sternotomy wires. Mitral annuloplasty ring in place. Stable cardiomediastinal silhouette with mild cardiomegaly. No pneumothorax. Small right pleural effusion is increased. No left pleural effusion. Mild pulmonary edema. Patchy right greater than left bibasilar lung opacities worsened on the right. IMPRESSION: 1. Mild congestive heart failure with increased small right pleural effusion. 2. Patchy bibasilar lung opacities, right greater than left, worsened on the right, favor atelectasis, cannot exclude a component of aspiration or pneumonia. Chest radiograph follow-up advised. Electronically Signed   By: Delbert Phenix M.D.   On: 01/21/2019 17:28   Dg Chest 2 View  Result Date: 01/08/2019 CLINICAL DATA:  Pulmonary embolism. Hx of copd, smoker EXAM: CHEST - 2 VIEW COMPARISON:  Chest radiograph 01/06/2019 FINDINGS: Stable cardiomediastinal contours with enlarged heart size. Surgical changes status post valve replacement. Stable pleuroparenchymal opacity at the right lung base with fluid extending into the minor fissure. Vascular congestion without overt edema. No acute finding in the visualized skeleton. IMPRESSION: Stable pleuroparenchymal opacity at the right lung base with fluid extending into the minor fissure. Electronically Signed   By: Emmaline Kluver M.D.   On: 01/08/2019 08:37   Dg Chest 2 View  Result Date: 01/06/2019 CLINICAL DATA:  Shortness of breath. EXAM: CHEST - 2 VIEW COMPARISON:  Radiograph 12/28/2018 FINDINGS: Post median sternotomy with prosthetic valve. Similar cardiomegaly and mediastinal contours. Worsening pleuroparenchymal opacity at the right lung base with increasing pleural effusion. Probable fluid in the minor fissure. There is vascular congestion without overt pulmonary edema. No pneumothorax. Unchanged retrocardiac atelectasis. IMPRESSION: 1. Increasing right pleuroparenchymal opacity and pleural effusion over the past 2 days. 2. Cardiomegaly with vascular congestion. Electronically Signed   By: Narda Rutherford M.D.   On: 01/06/2019 20:04   Dg Chest Portable 1 View  Result Date: 01/30/2019 CLINICAL DATA:  Shortness of breath EXAM: PORTABLE CHEST 1 VIEW COMPARISON:  01/21/2019 FINDINGS: No significant interval change in AP portable examination with small right pleural effusion and associated atelectasis or consolidation. The left lung is normally aerated. Cardiomegaly status post median sternotomy. IMPRESSION: 1. No significant interval change in AP portable examination with small right pleural effusion and associated atelectasis or consolidation. The left lung is normally aerated.  2.  Cardiomegaly status post median sternotomy. Electronically Signed   By: Lauralyn Primes M.D.   On: 01/30/2019 16:41    EKG: Normal sinus rhythm with nonspecific ST changes  Weights: Filed Weights   01/30/19 1557 01/31/19 0514  Weight: 71.7 kg 69.9 kg     Physical Exam: Blood pressure 135/67, pulse 94, temperature (!) 97.4 F (36.3 C), temperature source Oral, resp. rate 18, height 5'  8" (1.727 m), weight 69.9 kg, SpO2 100 %. Body mass index is 23.42 kg/m. General: Well developed, well nourished, in no acute distress. Head eyes ears nose throat: Normocephalic, atraumatic, sclera non-icteric, no xanthomas, nares are without discharge. No apparent thyromegaly and/or mass  Lungs: Normal respiratory effort.  no wheezes, no rales, no rhonchi.  Heart: RRR with normal S1 S2.  2-3+ right upper sternal border murmur gallop, no rub, PMI is normal size and placement, carotid upstroke normal without bruit, jugular venous pressure is normal Abdomen: Soft, non-tender, non-distended with normoactive bowel sounds. No hepatomegaly. No rebound/guarding. No obvious abdominal masses. Abdominal aorta is normal size without bruit Extremities: Trace edema. no cyanosis, no clubbing, no ulcers  Peripheral : 2+ bilateral upper extremity pulses, 2+ bilateral femoral pulses, 2+ bilateral dorsal pedal pulse Neuro: Alert and oriented. No facial asymmetry. No focal deficit. Moves all extremities spontaneously. Musculoskeletal: Normal muscle tone without kyphosis Psych:  Responds to questions appropriately with a normal affect.    Assessment: 58 year old male with hypertension valvular heart disease with acute on chronic diastolic dysfunction congestive heart failure with elevated BNP and minimal elevation of troponin consistent with demand ischemia rather than acute coronary syndrome improved with intravenous Lasix  Plan: 1.  Continue Lasix and change to 80 mg each day for further risk reduction of recurrent  diastolic dysfunction congestive heart failure 2.  Continue metoprolol for diastolic dysfunction congestive heart failure and hypertension control currently stable 3.  No further cardiac diagnostics necessary at this time 4.  Begin ambulation and follow-up for improvements of symptoms and possible discharge to home today if feeling well with follow-up next week for further adjustments of medication management and reduction of rehospitalization  Signed, Corey Skains M.D. [facility] 01/31/2019, 7:00 AM

## 2019-01-31 NOTE — ED Notes (Signed)
Patient repositioned and given warm blankets at this time.

## 2019-01-31 NOTE — ED Notes (Signed)
Patient given ice cream at this time.

## 2019-01-31 NOTE — ED Notes (Signed)
Patient called out to say that he is getting "congested feeling" due to the oxygen. Asked for a box of kleenex - given. Denies need for anything else at this time.

## 2019-01-31 NOTE — Evaluation (Signed)
Physical Therapy Evaluation Patient Details Name: [name] MRN: [id number] DOB: March 05, 1961 Today's Date: 01/31/2019   History of Present Illness  58 yo male with family assistance has been admitted for SOB and new use of 2L O2 with pulm edema and pleural effusion.  Has been diuresed for wgt gain from chronic CHF.  Has AKI, current tobacco use, acute COPD exacerbation.  PMHx: bipolar, hip pain with walker needed, CHF, COPD, HTN, HLD,   Clinical Impression  Pt was seen for mobility training after receiving instruction and monitoring for vitals including sats and pulses.  Pregait was 80 pulse with sat 100%, and post was 88 with sat 100%.  Pt is mildly SOB with signficant L hip pain from gait, but declines to use RW.  He would benefit from this and recommending PT to see at home to follow him along and get L hip as functional and painfree as possible.  See acutely for same.    Follow Up Recommendations Home health PT;Supervision for mobility/OOB    Equipment Recommendations  None recommended by PT    Recommendations for Other Services       Precautions / Restrictions Precautions Precautions: Fall(monitor O2 sats) Restrictions Weight Bearing Restrictions: No      Mobility  Bed Mobility Overal bed mobility: Independent                Transfers Overall transfer level: Modified independent Equipment used: None                Ambulation/Gait Ambulation/Gait assistance: Min guard Gait Distance (Feet): 150 Feet Assistive device: 1 person hand held assist Gait Pattern/deviations: Step-through pattern;Decreased stride length;Wide base of support;Decreased weight shift to left Gait velocity: reduced Gait velocity interpretation: <1.31 ft/sec, indicative of household ambulator General Gait Details: limping significantly with reduced stance time on LLE  Stairs            Wheelchair Mobility    Modified Rankin (Stroke Patients Only)       Balance Overall  balance assessment: Needs assistance Sitting-balance support: Feet supported Sitting balance-Leahy Scale: Good     Standing balance support: Single extremity supported Standing balance-Leahy Scale: Fair                               Pertinent Vitals/Pain Pain Assessment: 0-10 Pain Score: 7  Pain Location: L hip Pain Descriptors / Indicators: Guarding Pain Intervention(s): Limited activity within patient's tolerance;Monitored during session;Repositioned    Home Living Family/patient expects to be discharged to:: Private residence Living Arrangements: Other relatives Available Help at Discharge: Family;Available 24 hours/day Type of Home: House Home Access: Level entry     Home Layout: One level Home Equipment: Walker - 2 wheels Additional Comments: Has prev L hip surgery per pt but not on file    Prior Function Level of Independence: Independent         Comments: limps on L hip     Hand Dominance   Dominant Hand: Right    Extremity/Trunk Assessment   Upper Extremity Assessment Upper Extremity Assessment: Overall WFL for tasks assessed    Lower Extremity Assessment Lower Extremity Assessment: Overall WFL for tasks assessed    Cervical / Trunk Assessment Cervical / Trunk Assessment: Normal  Communication   Communication: No difficulties  Cognition Arousal/Alertness: Awake/alert Behavior During Therapy: Impulsive Overall Cognitive Status: Within Functional Limits for tasks assessed  General Comments General comments (skin integrity, edema, etc.): pt is walking with O2 2L and note pregait and post gait 100% saturations    Exercises     Assessment/Plan    PT Assessment Patient needs continued PT services  PT Problem List Decreased range of motion;Decreased activity tolerance;Decreased balance;Decreased mobility;Decreased coordination;Decreased safety awareness;Cardiopulmonary status  limiting activity;Pain       PT Treatment Interventions DME instruction;Gait training;Functional mobility training;Therapeutic activities;Therapeutic exercise;Balance training;Neuromuscular re-education;Patient/family education    PT Goals (Current goals can be found in the Care Plan section)  Acute Rehab PT Goals Patient Stated Goal: to get home and manage SOB PT Goal Formulation: With patient Time For Goal Achievement: 02/14/19 Potential to Achieve Goals: Good    Frequency Min 2X/week   Barriers to discharge   home with family to assist him    Co-evaluation               AM-PAC PT "6 Clicks" Mobility  Outcome Measure Help needed turning from your back to your side while in a flat bed without using bedrails?: None Help needed moving from lying on your back to sitting on the side of a flat bed without using bedrails?: None Help needed moving to and from a bed to a chair (including a wheelchair)?: None Help needed standing up from a chair using your arms (e.g., wheelchair or bedside chair)?: None Help needed to walk in hospital room?: A Little Help needed climbing 3-5 steps with a railing? : A Little 6 Click Score: 22    End of Session Equipment Utilized During Treatment: Gait belt;Oxygen Activity Tolerance: Patient limited by fatigue;Treatment limited secondary to medical complications (Comment) Patient left: in chair;with call bell/phone within reach;with chair alarm set Nurse Communication: Mobility status PT Visit Diagnosis: Unsteadiness on feet (R26.81);Muscle weakness (generalized) (M62.81);Pain Pain - Right/Left: Left Pain - part of body: Hip    Time: 0093-8182 PT Time Calculation (min) (ACUTE ONLY): 32 min   Charges:   PT Evaluation $PT Eval Moderate Complexity: 1 Mod PT Treatments $Gait Training: 8-22 mins       Ramond Dial 01/31/2019, 12:55 PM   Mee Hives, PT MS Acute Rehab Dept. Number: [place] [facility] [place]

## 2019-01-31 NOTE — ED Notes (Signed)
Patient resting quietly with eyes closed in no acute distress.  

## 2019-02-01 ENCOUNTER — Inpatient Hospital Stay: Payer: Medicaid Other

## 2019-02-01 LAB — COMPREHENSIVE METABOLIC PANEL
ALT: 274 U/L — ABNORMAL HIGH (ref 0–44)
AST: 531 U/L — ABNORMAL HIGH (ref 15–41)
Albumin: 3.2 g/dL — ABNORMAL LOW (ref 3.5–5.0)
Alkaline Phosphatase: 158 U/L — ABNORMAL HIGH (ref 38–126)
Anion gap: 15 (ref 5–15)
BUN: 54 mg/dL — ABNORMAL HIGH (ref 6–20)
CO2: 22 mmol/L (ref 22–32)
Calcium: 8.8 mg/dL — ABNORMAL LOW (ref 8.9–10.3)
Chloride: 99 mmol/L (ref 98–111)
Creatinine, Ser: 3.17 mg/dL — ABNORMAL HIGH (ref 0.61–1.24)
GFR calc Af Amer: 24 mL/min — ABNORMAL LOW (ref >60)
GFR calc non Af Amer: 20 mL/min — ABNORMAL LOW (ref >60)
Glucose, Bld: 91 mg/dL (ref 70–99)
Potassium: 5.9 mmol/L — ABNORMAL HIGH (ref 3.5–5.1)
Sodium: 136 mmol/L (ref 135–145)
Total Bilirubin: 2.3 mg/dL — ABNORMAL HIGH (ref 0.3–1.2)
Total Protein: 6.5 g/dL (ref 6.5–8.1)

## 2019-02-01 LAB — BASIC METABOLIC PANEL
Anion gap: 10 (ref 5–15)
BUN: 43 mg/dL — ABNORMAL HIGH (ref 6–20)
CO2: 24 mmol/L (ref 22–32)
Calcium: 8.5 mg/dL — ABNORMAL LOW (ref 8.9–10.3)
Chloride: 104 mmol/L (ref 98–111)
Creatinine, Ser: 2.32 mg/dL — ABNORMAL HIGH (ref 0.61–1.24)
GFR calc Af Amer: 35 mL/min — ABNORMAL LOW (ref >60)
GFR calc non Af Amer: 30 mL/min — ABNORMAL LOW (ref >60)
Glucose, Bld: 95 mg/dL (ref 70–99)
Potassium: 4.4 mmol/L (ref 3.5–5.1)
Sodium: 138 mmol/L (ref 135–145)

## 2019-02-01 LAB — CBC
HCT: 40.5 % (ref 39.0–52.0)
Hemoglobin: 12.9 g/dL — ABNORMAL LOW (ref 13.0–17.0)
MCH: 25.7 pg — ABNORMAL LOW (ref 26.0–34.0)
MCHC: 31.9 g/dL (ref 30.0–36.0)
MCV: 80.7 fL (ref 80.0–100.0)
Platelets: 207 10*3/uL (ref 150–400)
RBC: 5.02 MIL/uL (ref 4.22–5.81)
RDW: 17.2 % — ABNORMAL HIGH (ref 11.5–15.5)
WBC: 12.3 10*3/uL — ABNORMAL HIGH (ref 4.0–10.5)
nRBC: 0.2 % (ref 0.0–0.2)

## 2019-02-01 LAB — MAGNESIUM: Magnesium: 2.2 mg/dL (ref 1.7–2.4)

## 2019-02-01 LAB — PHOSPHORUS: Phosphorus: 7.4 mg/dL — ABNORMAL HIGH (ref 2.5–4.6)

## 2019-02-01 MED ORDER — FOLIC ACID 1 MG PO TABS
1.0000 mg | ORAL_TABLET | Freq: Every day | ORAL | Status: DC
  Administered 2019-02-02 – 2019-02-07 (×6): 1 mg via ORAL
  Filled 2019-02-01 (×6): qty 1

## 2019-02-01 MED ORDER — BISACODYL 5 MG PO TBEC: 10.0000 mg | DELAYED_RELEASE_TABLET | Freq: Every day | ORAL | Status: DC | PRN

## 2019-02-01 MED ORDER — POLYETHYLENE GLYCOL 3350 17 G PO PACK
17.0000 g | PACK | Freq: Every day | ORAL | Status: DC
  Administered 2019-02-02 – 2019-02-07 (×4): 17 g via ORAL
  Filled 2019-02-01 (×6): qty 1

## 2019-02-01 MED ORDER — LORAZEPAM 1 MG PO TABS: 1.0000 mg | ORAL_TABLET | ORAL | Status: DC | PRN

## 2019-02-01 MED ORDER — THIAMINE HCL 100 MG/ML IJ SOLN
100.0000 mg | Freq: Every day | INTRAMUSCULAR | Status: DC
  Filled 2019-02-01: qty 2

## 2019-02-01 MED ORDER — FLUTICASONE PROPIONATE 50 MCG/ACT NA SUSP
2.0000 | Freq: Every day | NASAL | Status: DC
  Administered 2019-02-01 – 2019-02-07 (×7): 2 via NASAL
  Filled 2019-02-01: qty 16

## 2019-02-01 MED ORDER — ADULT MULTIVITAMIN W/MINERALS CH
1.0000 | ORAL_TABLET | Freq: Every day | ORAL | Status: DC
  Administered 2019-02-02 – 2019-02-07 (×6): 1 via ORAL
  Filled 2019-02-01 (×6): qty 1

## 2019-02-01 MED ORDER — FUROSEMIDE 40 MG PO TABS: 40.0000 mg | ORAL_TABLET | Freq: Every day | ORAL | Status: DC

## 2019-02-01 MED ORDER — LORAZEPAM 2 MG/ML IJ SOLN: 1.0000 mg | INTRAMUSCULAR | Status: DC | PRN

## 2019-02-01 MED ORDER — VITAMIN B-1 100 MG PO TABS
100.0000 mg | ORAL_TABLET | Freq: Every day | ORAL | Status: DC
  Administered 2019-02-02 – 2019-02-07 (×6): 100 mg via ORAL
  Filled 2019-02-01 (×6): qty 1

## 2019-02-01 MED ORDER — FUROSEMIDE 40 MG PO TABS
40.0000 mg | ORAL_TABLET | Freq: Every day | ORAL | Status: DC
  Administered 2019-02-02: 40 mg via ORAL
  Filled 2019-02-01: qty 1

## 2019-02-01 NOTE — Plan of Care (Signed)

## 2019-02-01 NOTE — Progress Notes (Signed)
[facility] Cardiology [facility] Encounter Note  Patient: [name] / Admit Date: 01/30/2019 / Date of Encounter: 02/01/2019, 8:23 AM   Subjective: Patient is still short of breath with some chest fullness of unknown etiology.  No evidence of significant amount of rails or pulmonary edema by chest x-ray.  Echocardiogram does show some pulmonary hypertension possibly contributing to edema secondary to possible respiratory abnormality.  Troponin elevation of 28 consistent with demand ischemia rather than acute coronary syndrome  Review of Systems: Positive for: Shortness of breath Negative for: Vision change, hearing change, syncope, dizziness, nausea, vomiting,diarrhea, bloody stool, stomach pain, cough, congestion, diaphoresis, urinary frequency, urinary pain,skin lesions, skin rashes Others previously listed  Objective: Telemetry: Normal sinus rhythm Physical Exam: Blood pressure 108/65, pulse 73, temperature 98.2 F (36.8 C), resp. rate 18, height 5\' 8"  (1.727 m), weight 70.6 kg, SpO2 100 %. Body mass index is 23.66 kg/m. General: Well developed, well nourished, in no acute distress. Head: Normocephalic, atraumatic, sclera non-icteric, no xanthomas, nares are without discharge. Neck: No apparent masses Lungs: Normal respirations with few wheezes, no rhonchi, no rales , no crackles   Heart: Regular rate and rhythm, normal S1 S2, right upper sternal murmur, no rub, no gallop, PMI is normal size and placement, carotid upstroke normal without bruit, jugular venous pressure normal Abdomen: Soft, non-tender, non-distended with normoactive bowel sounds. No hepatosplenomegaly. Abdominal aorta is normal size without bruit Extremities: Trace edema, no clubbing, no cyanosis, no ulcers,  Peripheral: 2+ radial, 2+ femoral, 2+ dorsal pedal pulses Neuro: Alert and oriented. Moves all extremities spontaneously. Psych:  Responds to questions appropriately with a normal affect.   Intake/Output  Summary (Last 24 hours) at 02/01/2019 0823 Last data filed at 01/31/2019 1300 Gross per 24 hour  Intake 480 ml  Output 450 ml  Net 30 ml    Inpatient Medications:  . acetaminophen  1,000 mg Oral Q8H  . aspirin EC  325 mg Oral Daily  . atorvastatin  40 mg Oral Daily  . FLUoxetine  20 mg Oral BH-q7a  . furosemide  40 mg Intravenous BID  . gabapentin  300 mg Oral TID  . heparin  5,000 Units Subcutaneous Q8H  . ipratropium-albuterol  3 mL Nebulization Q6H  . mouth rinse  15 mL Mouth Rinse BID  . meloxicam  15 mg Oral Daily  . metoprolol tartrate  25 mg Oral Daily  . mirtazapine  15 mg Oral QHS  . naproxen  500 mg Oral BID WC  . pneumococcal 23 valent vaccine  0.5 mL Intramuscular Tomorrow-1000  . sodium chloride flush  3 mL Intravenous Q12H   Infusions:  . sodium chloride      Labs: Recent Labs    01/31/19 0539 02/01/19 0444  NA 140 138  K 4.3 4.4  CL 104 104  CO2 25 24  GLUCOSE 145* 95  BUN 35* 43*  CREATININE 1.48* 2.32*  CALCIUM 8.7* 8.5*   Recent Labs    01/30/19 1600  AST 90*  ALT 64*  ALKPHOS 121  BILITOT 1.7*  PROT 6.9  ALBUMIN 3.5   Recent Labs    01/30/19 1600  WBC 7.1  NEUTROABS 4.5  HGB 12.0*  HCT 37.3*  MCV 80.4  PLT 216   No results for input(s): CKTOTAL, CKMB, TROPONINI in the last 72 hours. Invalid input(s): POCBNP No results for input(s): HGBA1C in the last 72 hours.   Weights: Filed Weights   01/30/19 1557 01/31/19 0514 02/01/19 0353  Weight: 71.7 kg  69.9 kg 70.6 kg     Radiology/Studies:  Dg Chest 2 View  Result Date: 01/21/2019 CLINICAL DATA:  Dyspnea for few weeks with left lower extremity swelling EXAM: CHEST - 2 VIEW COMPARISON:  01/08/2019 chest radiograph. FINDINGS: Intact sternotomy wires. Mitral annuloplasty ring in place. Stable cardiomediastinal silhouette with mild cardiomegaly. No pneumothorax. Small right pleural effusion is increased. No left pleural effusion. Mild pulmonary edema. Patchy right greater than left  bibasilar lung opacities worsened on the right. IMPRESSION: 1. Mild congestive heart failure with increased small right pleural effusion. 2. Patchy bibasilar lung opacities, right greater than left, worsened on the right, favor atelectasis, cannot exclude a component of aspiration or pneumonia. Chest radiograph follow-up advised. Electronically Signed   By: Delbert PhenixJason A Poff M.D.   On: 01/21/2019 17:28   Dg Chest 2 View  Result Date: 01/08/2019 CLINICAL DATA:  Pulmonary embolism. Hx of copd, smoker EXAM: CHEST - 2 VIEW COMPARISON:  Chest radiograph 01/06/2019 FINDINGS: Stable cardiomediastinal contours with enlarged heart size. Surgical changes status post valve replacement. Stable pleuroparenchymal opacity at the right lung base with fluid extending into the minor fissure. Vascular congestion without overt edema. No acute finding in the visualized skeleton. IMPRESSION: Stable pleuroparenchymal opacity at the right lung base with fluid extending into the minor fissure. Electronically Signed   By: Emmaline KluverNancy  Ballantyne M.D.   On: 01/08/2019 08:37   Dg Chest 2 View  Result Date: 01/06/2019 CLINICAL DATA:  Shortness of breath. EXAM: CHEST - 2 VIEW COMPARISON:  Radiograph 12/28/2018 FINDINGS: Post median sternotomy with prosthetic valve. Similar cardiomegaly and mediastinal contours. Worsening pleuroparenchymal opacity at the right lung base with increasing pleural effusion. Probable fluid in the minor fissure. There is vascular congestion without overt pulmonary edema. No pneumothorax. Unchanged retrocardiac atelectasis. IMPRESSION: 1. Increasing right pleuroparenchymal opacity and pleural effusion over the past 2 days. 2. Cardiomegaly with vascular congestion. Electronically Signed   By: Narda RutherfordMelanie  Sanford M.D.   On: 01/06/2019 20:04   Dg Chest Portable 1 View  Result Date: 01/30/2019 CLINICAL DATA:  Shortness of breath EXAM: PORTABLE CHEST 1 VIEW COMPARISON:  01/21/2019 FINDINGS: No significant interval change in  AP portable examination with small right pleural effusion and associated atelectasis or consolidation. The left lung is normally aerated. Cardiomegaly status post median sternotomy. IMPRESSION: 1. No significant interval change in AP portable examination with small right pleural effusion and associated atelectasis or consolidation. The left lung is normally aerated. 2.  Cardiomegaly status post median sternotomy. Electronically Signed   By: Lauralyn PrimesAlex  Bibbey M.D.   On: 01/30/2019 16:41   Dg Abd 2 Views  Result Date: 01/31/2019 CLINICAL DATA:  Abdominal pain and distension EXAM: ABDOMEN - 2 VIEW COMPARISON:  None. FINDINGS: Nonobstructive pattern of bowel gas with gas present to the rectum. Scattered stool present throughout the colon. No free air in the abdomen. Cardiomegaly in the included lower chest, better assessed by dedicated chest radiograph. 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 IMPRESSION: Nonobstructive pattern of bowel gas with gas present to the rectum. Scattered stool present throughout the colon. No free air in the abdomen. Electronically Signed   By: Lauralyn PrimesAlex  Bibbey M.D.   On: 01/31/2019 17:32     Assessment and Recommendation  58 y.o. male with acute on chronic diastolic dysfunction heart failure with elevated BNP and demand ischemia by above with elevated troponin slightly improved with intravenous Lasix but still with concerns of shortness of breath and abdominal fullness 1.  Continue Lasix  for abdominal fullness possible pulmonary hypertension and fluid retention with acute on chronic diastolic dysfunction heart failure 2.  No further cardiac diagnostics due to no evidence of acute coronary syndrome 3.  Further evaluation of possible pulmonary etiology of symptoms 4.  Continue beta-blocker for heart rate control and heart failure 5.  Begin ambulation and follow for improvements of symptoms  Signed, Arnoldo Hooker M.D. FACC

## 2019-02-01 NOTE — Progress Notes (Signed)
OT Cancellation Note  Patient Details Name: [name] MRN: [id number] DOB: November 14, 1960   Cancelled Treatment:    Reason Eval/Treat Not Completed: Other (comment). Consult received, chart reviewed. Pt in bathroom upon initial attempt. Unavailable for OT evaluation. Will re-attempt at later date/time as pt is available and medically appropriate.   Jeni Salles, MPH, MS, OTR/L ascom [phone] 02/01/19, 11:59 AM

## 2019-02-01 NOTE — Progress Notes (Signed)
Patient ID: [name] [place], male   DOB: 09/24/1960, 58 y.o.   MRN: [id number]  [employer] PROGRESS NOTE  [name] [place] MVH:[id number] DOB: 01/18/1961 DOA: 01/30/2019 PCP: Medicine, [employer]  HPI/Subjective: Patient feeling better than when he came in.  Reporting sinus congestion generalized weakness.  Continue to drink alcohol came in he was very short of breath.  Has abdominal distention.  Objective: Vitals:   02/01/19 0742 02/01/19 0751  BP: 108/65   Pulse: 73   Resp: 18   Temp: 98.2 F (36.8 C)   SpO2: 100% 100%   No intake or output data in the 24 hours ending 02/01/19 1503 Filed Weights   01/30/19 1557 01/31/19 0514 02/01/19 0353  Weight: 71.7 kg 69.9 kg 70.6 kg    ROS: Review of Systems  Constitutional: Negative for chills and fever.  Eyes: Negative for blurred vision.  Respiratory: Positive for cough and shortness of breath.   Cardiovascular: Negative for chest pain.  Gastrointestinal: Positive for abdominal pain. Negative for constipation, diarrhea, nausea and vomiting.  Genitourinary: Negative for dysuria.  Musculoskeletal: Negative for joint pain.  Neurological: Negative for dizziness and headaches.   Exam: Physical Exam  Constitutional: He is oriented to person, place, and time.  HENT:  Nose: No mucosal edema.  Mouth/Throat: No oropharyngeal exudate or posterior oropharyngeal edema.  Eyes: Pupils are equal, round, and reactive to light. Conjunctivae, EOM and lids are normal.  Neck: No JVD present. Carotid bruit is not present. No edema present. No thyroid mass and no thyromegaly present.  Cardiovascular: S1 normal and S2 normal. Exam reveals no gallop.  Murmur heard.  Systolic murmur is present with a grade of 3/6. Respiratory: No respiratory distress. He has decreased breath sounds in the right lower field and the left lower field. He has no wheezes. He has no rhonchi. He has rales in the right lower field and the left lower field.  GI: Soft.  Bowel sounds are normal. He exhibits distension. There is abdominal tenderness.  Musculoskeletal:     Right ankle: He exhibits swelling.     Left ankle: He exhibits swelling.  Lymphadenopathy:    He has no cervical adenopathy.  Neurological: He is alert and oriented to person, place, and time. No cranial nerve deficit.  Skin: Skin is warm. No rash noted. Nails show no clubbing.  Psychiatric: He has a normal mood and affect.      Data Reviewed: Basic Metabolic Panel: Recent Labs  Lab 01/30/19 1600 01/31/19 0539 02/01/19 0444  NA 136 140 138  K 5.5* 4.3 4.4  CL 103 104 104  CO2 22 25 24   GLUCOSE 98 145* 95  BUN 27* 35* 43*  CREATININE 1.29* 1.48* 2.32*  CALCIUM 8.7* 8.7* 8.5*   Liver Function Tests: Recent Labs  Lab 01/30/19 1600  AST 90*  ALT 64*  ALKPHOS 121  BILITOT 1.7*  PROT 6.9  ALBUMIN 3.5   CBC: Recent Labs  Lab 01/30/19 1600  WBC 7.1  NEUTROABS 4.5  HGB 12.0*  HCT 37.3*  MCV 80.4  PLT 216   BNP (last 3 results) Recent Labs    01/06/19 1922 01/30/19 1600  BNP 1,112.0* 1,234.0*      Recent Results (from the past 240 hour(s))  SARS CORONAVIRUS 2 (TAT 6-24 HRS) Nasopharyngeal Nasopharyngeal Swab     Status: None   Collection Time: 01/30/19  4:00 PM   Specimen: Nasopharyngeal Swab  Result Value Ref Range Status   SARS Coronavirus 2 NEGATIVE  NEGATIVE Final    Comment: (NOTE) SARS-CoV-2 target nucleic acids are NOT DETECTED. The SARS-CoV-2 RNA is generally detectable in upper and lower respiratory specimens during the acute phase of infection. Negative results do not preclude SARS-CoV-2 infection, do not rule out co-infections with other pathogens, and should not be used as the sole basis for treatment or other patient management decisions. Negative results must be combined with clinical observations, patient history, and epidemiological information. The expected result is Negative. Fact Sheet for  Patients: SugarRoll.be Fact Sheet for Healthcare Providers: https://www.woods-mathews.com/ This test is not yet approved or cleared by the [place] FDA and  has been authorized for detection and/or diagnosis of SARS-CoV-2 by FDA under an Emergency Use Authorization (EUA). This EUA will remain  in effect (meaning this test can be used) for the duration of the COVID-19 declaration under Section 56 4(b)(1) of the Act, 21 U.S.C. section 360bbb-3(b)(1), unless the authorization is terminated or revoked sooner. Performed at [facility], [place] [address] [place], [place] [address]      Studies: Dg Abd 2 Views  Result Date: 01/31/2019 CLINICAL DATA:  Abdominal pain and distension EXAM: ABDOMEN - 2 VIEW COMPARISON:  None. FINDINGS: Nonobstructive pattern of bowel gas with gas present to the rectum. Scattered stool present throughout the colon. No free air in the abdomen. Cardiomegaly in the included lower chest, better assessed by dedicated chest radiograph. 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 IMPRESSION: Nonobstructive pattern of bowel gas with gas present to the rectum. Scattered stool present throughout the colon. No free air in the abdomen. Electronically Signed   By: Eddie Candle M.D.   On: 01/31/2019 17:32   US Abdomen Limited Ruq  Result Date: 02/01/2019 CLINICAL DATA:  Cirrhosis and abdominal distension EXAM: ULTRASOUND ABDOMEN LIMITED RIGHT UPPER QUADRANT COMPARISON:  None. FINDINGS: Gallbladder: No gallstones visualized. The gallbladder wall is diffusely thickened measuring up to 0.7 cm. No sonographic Murphy sign noted by sonographer. Common bile duct: Diameter: 0.4 cm Liver: No focal lesion identified. The liver contour is mildly nodular. Within normal limits in parenchymal echogenicity. Portal vein is patent on color Doppler imaging with normal direction of blood flow towards the liver. Other:  Mild right upper quadrant ascites. Small right pleural effusion. IMPRESSION: 1. Mildly nodular contour of the liver consistent with history of cirrhosis. Mild right upper quadrant ascites and small pleural effusion visualized. 2. Gallbladder wall thickening which is a nonspecific finding and can be seen in the setting of gallbladder inflammation, cirrhosis, fluid overload states, hypoalbuminemia, among others. No gallstones visualized. Electronically Signed   By: Audie Pinto M.D.   On: 02/01/2019 09:11    Scheduled Meds: . acetaminophen  1,000 mg Oral Q8H  . aspirin EC  325 mg Oral Daily  . atorvastatin  40 mg Oral Daily  . FLUoxetine  20 mg Oral BH-q7a  . [START ON 02/02/2019] furosemide  40 mg Oral Daily  . gabapentin  300 mg Oral TID  . heparin  5,000 Units Subcutaneous Q8H  . ipratropium-albuterol  3 mL Nebulization Q6H  . mouth rinse  15 mL Mouth Rinse BID  . meloxicam  15 mg Oral Daily  . metoprolol tartrate  25 mg Oral Daily  . mirtazapine  15 mg Oral QHS  . naproxen  500 mg Oral BID WC  . pneumococcal 23 valent vaccine  0.5 mL Intramuscular Tomorrow-1000  . sodium chloride flush  3 mL Intravenous Q12H   Continuous Infusions: .  sodium chloride      Assessment/Plan:  1. Acute diastolic congestive heart failure with moderate mitral regurgitation and severe tricuspid regurgitation.  Diuresis with IV Lasix.  Change to p.o. Lasix as the renal function bumped up from 1.48 of creatinine to 2.32 continue metoprolol.  Repeat a.m. labs 2. Acute hypoxic respiratory failure.  Try to wean off oxygen.  Patient does not wear oxygen at home. 3. Pulmonary hypertension seen on echocardiogram.  Pulmonology consult 4. 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  Ultrasound with the cholecystitis with gallbladder wall thickening but no gallstones but patient is reporting abdominal distention .will get ultrasound-guided paracentesis for symptomatic relief and also will get labs   5. Hyperlipidemia unspecified on Lipitor 6. Chronic kidney disease stage II.  Watch with diuresis Hyperkalemia on presentation.  Watch with diuresis 7.  Constipation with scattered stool throughout the colon we will put him on stool softeners and laxatives Code Status:     Code Status Orders  (From admission, onward)         Start     Ordered   01/30/19 2200  Full code  Continuous     01/30/19 2203        Code Status History    Date Active Date Inactive Code Status Order ID Comments User Context   01/07/2019 0231 01/08/2019 1755 Full Code [id number]  Oralia Manis, MD Inpatient   08/07/2011 1543 08/08/2011 1513 Full Code [id number]  Glynn Octave, MD ED   Advance Care Planning Activity     Family Communication: Patient did not want me to speak with family. Disposition Plan: To be determined  Consultants:  Cardiology  Time spent: 35  minutes  Deanna Artis [address] [place]  [employer]

## 2019-02-01 NOTE — Evaluation (Signed)
Occupational Therapy Evaluation Patient Details Name: [name] MRN: [id number] DOB: 02/01/1961 Today's Date: 02/01/2019    History of Present Illness 58 yo male with family assistance has been admitted for SOB and new use of 2L O2 with pulm edema and pleural effusion.  Has been diuresed for wgt gain from chronic CHF.  Has AKI, current tobacco use, acute COPD exacerbation.  PMHx: bipolar, hip pain with walker needed, CHF, COPD, HTN, HLD,    Clinical Impression   Pt seen for OT evaluation this date. Pt was independent in all ADL and mobility. Pt does not use O2 at home. Pt currently presents with decreased activity tolerance and pt reports improving BLE edema with compression stockings on. Pt denies previous use of stockings. Pt instructed in stockings mgt and pt verbalized understanding. Pt reports 7/10 headache which worsens slightly with standing and he describes it as "pressure." Pt on room air during session, O2 sats >98% throughout. Pt instructed in energy conservation strategies and pursed lip breathing to support breath recovery and activity pacing for ADL and functional mobility in order to maximize safety/independence while minimizing falls risk and risk of readmission. Pt will benefit from additional skilled OT services to maximize recall and carryover of learned techniques and facilitate implementation of learned techniques into daily routines. Upon discharge, do not anticipate need for skilled OT services at this time but will continue to assess.      Follow Up Recommendations  No OT follow up    Equipment Recommendations  None recommended by OT    Recommendations for Other Services       Precautions / Restrictions Precautions Precautions: None Restrictions Weight Bearing Restrictions: No      Mobility Bed Mobility Overal bed mobility: Independent                Transfers Overall transfer level: Modified independent Equipment used: None                   Balance Overall balance assessment: Needs assistance Sitting-balance support: Feet supported;No upper extremity supported Sitting balance-Leahy Scale: Normal     Standing balance support: No upper extremity supported Standing balance-Leahy Scale: Fair                             ADL either performed or assessed with clinical judgement   ADL Overall ADL's : Modified independent                                       General ADL Comments: Pt able to perform ADL with remote supervision, no difficulty noted. Pt does endorse fatigue     Vision Baseline Vision/History: No visual deficits Patient Visual Report: No change from baseline Vision Assessment?: No apparent visual deficits     Perception     Praxis      Pertinent Vitals/Pain Pain Assessment: 0-10 Pain Score: 7  Pain Location: headache Pain Descriptors / Indicators: Aching;Headache Pain Intervention(s): Limited activity within patient's tolerance;Repositioned;Patient requesting pain meds-RN notified     Hand Dominance Right   Extremity/Trunk Assessment Upper Extremity Assessment Upper Extremity Assessment: Overall WFL for tasks assessed   Lower Extremity Assessment Lower Extremity Assessment: Overall WFL for tasks assessed   Cervical / Trunk Assessment Cervical / Trunk Assessment: Normal   Communication Communication Communication: No difficulties   Cognition Arousal/Alertness: Awake/alert Behavior During  Therapy: WFL for tasks assessed/performed Overall Cognitive Status: Within Functional Limits for tasks assessed                                     General Comments  O2 sats 99% on RA, pt denies SOB    Exercises Other Exercises Other Exercises: Pt instructed in compression stocking mgt and pt verbalized understanding   Shoulder Instructions      Home Living Family/patient expects to be discharged to:: Private residence Living Arrangements: Other  relatives Available Help at Discharge: Family;Available 24 hours/day Type of Home: House Home Access: Level entry     Home Layout: One level         Bathroom Toilet: Standard     Home Equipment: Walker - 2 wheels   Additional Comments: Has prev L hip surgery per pt but not on file      Prior Functioning/Environment Level of Independence: Independent        Comments: limps on L hip        OT Problem List: Decreased activity tolerance;Increased edema      OT Treatment/Interventions: Self-care/ADL training;Therapeutic exercise;Therapeutic activities;Energy conservation;DME and/or AE instruction;Patient/family education    OT Goals(Current goals can be found in the care plan section) Acute Rehab OT Goals Patient Stated Goal: go home and not relapse OT Goal Formulation: With patient Time For Goal Achievement: 02/15/19 Potential to Achieve Goals: Good ADL Goals Additional ADL Goal #1: Pt will perform compression stocking mgt independently. Additional ADL Goal #2: Pt will verbalize plan to implement at least 2 energy conservation strategies.  OT Frequency: Min 1X/week   Barriers to D/C:            Co-evaluation              AM-PAC OT "6 Clicks" Daily Activity     Outcome Measure Help from another person eating meals?: None Help from another person taking care of personal grooming?: None Help from another person toileting, which includes using toliet, bedpan, or urinal?: None Help from another person bathing (including washing, rinsing, drying)?: A Little Help from another person to put on and taking off regular upper body clothing?: None Help from another person to put on and taking off regular lower body clothing?: None 6 Click Score: 23   End of Session    Activity Tolerance: Patient tolerated treatment well Patient left: in bed;with call bell/phone within reach  OT Visit Diagnosis: Other abnormalities of gait and mobility (R26.89)                 Time: 3220-2542 OT Time Calculation (min): 21 min Charges:  OT General Charges $OT Visit: 1 Visit OT Evaluation $OT Eval Low Complexity: 1 Low OT Treatments $Self Care/Home Management : 8-22 mins  Jeni Salles, MPH, MS, OTR/L ascom [phone] 02/01/19, 3:28 PM

## 2019-02-01 NOTE — Progress Notes (Signed)
Pt c/o dizziness, and "not feeling good".  BP 88/68.  MD informed.  Awaiting new orders.

## 2019-02-01 NOTE — Plan of Care (Signed)
  Problem: Education: Goal: Knowledge of General Education information will improve Description: Including pain rating scale, medication(s)/side effects and non-pharmacologic comfort measures 02/01/2019 1710 by Aubery Lapping, RN Outcome: Progressing 02/01/2019 1439 by Aubery Lapping, RN Outcome: Progressing   Problem: Health Behavior/Discharge Planning: Goal: Ability to manage health-related needs will improve 02/01/2019 1710 by Aubery Lapping, RN Outcome: Progressing 02/01/2019 1439 by Aubery Lapping, RN Outcome: Progressing   Problem: Clinical Measurements: Goal: Ability to maintain clinical measurements within normal limits will improve 02/01/2019 1710 by Aubery Lapping, RN Outcome: Progressing 02/01/2019 1439 by Aubery Lapping, RN Outcome: Progressing Goal: Will remain free from infection 02/01/2019 1710 by Aubery Lapping, RN Outcome: Progressing 02/01/2019 1439 by Aubery Lapping, RN Outcome: Progressing Goal: Diagnostic test results will improve 02/01/2019 1710 by Aubery Lapping, RN Outcome: Progressing 02/01/2019 1439 by Aubery Lapping, RN Outcome: Progressing Goal: Respiratory complications will improve 02/01/2019 1710 by Aubery Lapping, RN Outcome: Progressing 02/01/2019 1439 by Aubery Lapping, RN Outcome: Progressing Goal: Cardiovascular complication will be avoided 02/01/2019 1710 by Aubery Lapping, RN Outcome: Progressing 02/01/2019 1439 by Aubery Lapping, RN Outcome: Progressing   Problem: Activity: Goal: Risk for activity intolerance will decrease 02/01/2019 1710 by Aubery Lapping, RN Outcome: Progressing 02/01/2019 1439 by Aubery Lapping, RN Outcome: Progressing   Problem: Nutrition: Goal: Adequate nutrition will be maintained 02/01/2019 1710 by Aubery Lapping, RN Outcome: Progressing 02/01/2019 1439 by Aubery Lapping, RN Outcome: Progressing   Problem:  Coping: Goal: Level of anxiety will decrease 02/01/2019 1710 by Aubery Lapping, RN Outcome: Progressing 02/01/2019 1439 by Aubery Lapping, RN Outcome: Progressing   Problem: Elimination: Goal: Will not experience complications related to bowel motility 02/01/2019 1710 by Aubery Lapping, RN Outcome: Progressing 02/01/2019 1439 by Aubery Lapping, RN Outcome: Progressing Goal: Will not experience complications related to urinary retention 02/01/2019 1710 by Aubery Lapping, RN Outcome: Progressing 02/01/2019 1439 by Aubery Lapping, RN Outcome: Progressing   Problem: Pain Managment: Goal: General experience of comfort will improve 02/01/2019 1710 by Aubery Lapping, RN Outcome: Progressing 02/01/2019 1439 by Aubery Lapping, RN Outcome: Progressing   Problem: Safety: Goal: Ability to remain free from injury will improve 02/01/2019 1710 by Aubery Lapping, RN Outcome: Progressing 02/01/2019 1439 by Aubery Lapping, RN Outcome: Progressing   Problem: Skin Integrity: Goal: Risk for impaired skin integrity will decrease 02/01/2019 1710 by Aubery Lapping, RN Outcome: Progressing 02/01/2019 1439 by Aubery Lapping, RN Outcome: Progressing   Problem: Education: Goal: Ability to demonstrate management of disease process will improve 02/01/2019 1710 by Aubery Lapping, RN Outcome: Progressing 02/01/2019 1439 by Aubery Lapping, RN Outcome: Progressing Goal: Ability to verbalize understanding of medication therapies will improve 02/01/2019 1710 by Aubery Lapping, RN Outcome: Progressing 02/01/2019 1439 by Aubery Lapping, RN Outcome: Progressing Goal: Individualized Educational Video(s) 02/01/2019 1710 by Aubery Lapping, RN Outcome: Progressing 02/01/2019 1439 by Aubery Lapping, RN Outcome: Progressing   Problem: Activity: Goal: Capacity to carry out activities will improve 02/01/2019 1710 by  Azelyn Batie, [place] Pho, RN Outcome: Progressing 02/01/2019 1439 by Aubery Lapping, RN Outcome: Progressing   Problem: Cardiac: Goal: Ability to achieve and maintain adequate cardiopulmonary perfusion will improve 02/01/2019 1710 by Aubery Lapping, RN Outcome: Progressing 02/01/2019 1439 by Aubery Lapping, RN Outcome: Progressing

## 2019-02-01 NOTE — Plan of Care (Deleted)
Discharge instructions provided to pt.  All questions addressed.  Understanding verified through teach back.  Awaiting transportation home via taxi.   Problem: Education: Goal: Knowledge of General Education information will improve Description: Including pain rating scale, medication(s)/side effects and non-pharmacologic comfort measures Outcome: Adequate for Discharge   Problem: Health Behavior/Discharge Planning: Goal: Ability to manage health-related needs will improve Outcome: Adequate for Discharge   Problem: Clinical Measurements: Goal: Ability to maintain clinical measurements within normal limits will improve Outcome: Adequate for Discharge Goal: Will remain free from infection Outcome: Adequate for Discharge Goal: Diagnostic test results will improve Outcome: Adequate for Discharge Goal: Respiratory complications will improve Outcome: Adequate for Discharge Goal: Cardiovascular complication will be avoided Outcome: Adequate for Discharge   Problem: Activity: Goal: Risk for activity intolerance will decrease Outcome: Adequate for Discharge   Problem: Pain Managment: Goal: General experience of comfort will improve Outcome: Adequate for Discharge   Problem: Safety: Goal: Ability to remain free from injury will improve Outcome: Adequate for Discharge   Problem: Skin Integrity: Goal: Risk for impaired skin integrity will decrease Outcome: Adequate for Discharge   Problem: Education: Goal: Ability to demonstrate management of disease process will improve Outcome: Adequate for Discharge Goal: Ability to verbalize understanding of medication therapies will improve Outcome: Adequate for Discharge Goal: Individualized Educational Video(s) Outcome: Adequate for Discharge   Problem: Activity: Goal: Capacity to carry out activities will improve Outcome: Adequate for Discharge   Problem: Cardiac: Goal: Ability to achieve and maintain adequate cardiopulmonary  perfusion will improve Outcome: Adequate for Discharge   Problem: Acute Rehab PT Goals(only PT should resolve) Goal: Patient Will Transfer Sit To/From Stand Outcome: Adequate for Discharge Goal: Pt Will Transfer Bed To Chair/Chair To Bed Outcome: Adequate for Discharge Goal: Pt Will Perform Standing Balance Or Pre-Gait Outcome: Adequate for Discharge Goal: Pt Will Ambulate Outcome: Adequate for Discharge

## 2019-02-02 ENCOUNTER — Inpatient Hospital Stay: Payer: Medicaid Other

## 2019-02-02 DIAGNOSIS — K7011 Alcoholic hepatitis with ascites: Secondary | ICD-10-CM

## 2019-02-02 LAB — CBC WITH DIFFERENTIAL/PLATELET
Abs Immature Granulocytes: 0.1 10*3/uL — ABNORMAL HIGH (ref 0.00–0.07)
Basophils Absolute: 0 10*3/uL (ref 0.0–0.1)
Basophils Relative: 0 %
Eosinophils Absolute: 0 10*3/uL (ref 0.0–0.5)
Eosinophils Relative: 0 %
HCT: 40.2 % (ref 39.0–52.0)
Hemoglobin: 13.1 g/dL (ref 13.0–17.0)
Immature Granulocytes: 1 %
Lymphocytes Relative: 14 %
Lymphs Abs: 1.5 10*3/uL (ref 0.7–4.0)
MCH: 25.7 pg — ABNORMAL LOW (ref 26.0–34.0)
MCHC: 32.6 g/dL (ref 30.0–36.0)
MCV: 78.8 fL — ABNORMAL LOW (ref 80.0–100.0)
Monocytes Absolute: 1.3 10*3/uL — ABNORMAL HIGH (ref 0.1–1.0)
Monocytes Relative: 12 %
Neutro Abs: 7.7 10*3/uL (ref 1.7–7.7)
Neutrophils Relative %: 73 %
Platelets: 195 10*3/uL (ref 150–400)
RBC: 5.1 MIL/uL (ref 4.22–5.81)
RDW: 16.5 % — ABNORMAL HIGH (ref 11.5–15.5)
WBC: 10.7 10*3/uL — ABNORMAL HIGH (ref 4.0–10.5)
nRBC: 0.2 % (ref 0.0–0.2)

## 2019-02-02 LAB — BODY FLUID CELL COUNT WITH DIFFERENTIAL
Eos, Fluid: 0 %
Lymphs, Fluid: 19 %
Monocyte-Macrophage-Serous Fluid: 52 %
Neutrophil Count, Fluid: 29 %
Total Nucleated Cell Count, Fluid: 9 cu mm

## 2019-02-02 LAB — COMPREHENSIVE METABOLIC PANEL
ALT: 624 U/L — ABNORMAL HIGH (ref 0–44)
AST: 1318 U/L — ABNORMAL HIGH (ref 15–41)
Albumin: 3.1 g/dL — ABNORMAL LOW (ref 3.5–5.0)
Alkaline Phosphatase: 149 U/L — ABNORMAL HIGH (ref 38–126)
Anion gap: 11 (ref 5–15)
BUN: 62 mg/dL — ABNORMAL HIGH (ref 6–20)
CO2: 24 mmol/L (ref 22–32)
Calcium: 8.8 mg/dL — ABNORMAL LOW (ref 8.9–10.3)
Chloride: 101 mmol/L (ref 98–111)
Creatinine, Ser: 3.51 mg/dL — ABNORMAL HIGH (ref 0.61–1.24)
GFR calc Af Amer: 21 mL/min — ABNORMAL LOW (ref >60)
GFR calc non Af Amer: 18 mL/min — ABNORMAL LOW (ref >60)
Glucose, Bld: 104 mg/dL — ABNORMAL HIGH (ref 70–99)
Potassium: 5.3 mmol/L — ABNORMAL HIGH (ref 3.5–5.1)
Sodium: 136 mmol/L (ref 135–145)
Total Bilirubin: 2.2 mg/dL — ABNORMAL HIGH (ref 0.3–1.2)
Total Protein: 6.6 g/dL (ref 6.5–8.1)

## 2019-02-02 LAB — LIPASE, BLOOD: Lipase: 32 U/L (ref 11–51)

## 2019-02-02 LAB — ALBUMIN, PLEURAL OR PERITONEAL FLUID: Albumin, Fluid: 1.5 g/dL

## 2019-02-02 LAB — HEPATITIS B SURFACE ANTIGEN: Hepatitis B Surface Ag: NONREACTIVE

## 2019-02-02 LAB — HEPATITIS A ANTIBODY, IGM: Hep A IgM: NONREACTIVE

## 2019-02-02 LAB — HEPATITIS C ANTIBODY: HCV Ab: REACTIVE — AB

## 2019-02-02 LAB — PROTIME-INR
INR: 2.3 — ABNORMAL HIGH (ref 0.8–1.2)
Prothrombin Time: 24.9 seconds — ABNORMAL HIGH (ref 11.4–15.2)

## 2019-02-02 LAB — LACTATE DEHYDROGENASE, PLEURAL OR PERITONEAL FLUID: LD, Fluid: 298 U/L — ABNORMAL HIGH (ref 3–23)

## 2019-02-02 LAB — PROTEIN, PLEURAL OR PERITONEAL FLUID: Total protein, fluid: <3 g/dL

## 2019-02-02 LAB — GLUCOSE, PLEURAL OR PERITONEAL FLUID: Glucose, Fluid: 115 mg/dL

## 2019-02-02 LAB — AMYLASE, PLEURAL OR PERITONEAL FLUID: Amylase, Fluid: 31 U/L

## 2019-02-02 MED ORDER — DULOXETINE HCL 30 MG PO CPEP: 30.0000 mg | ORAL_CAPSULE | Freq: Every day | ORAL | Status: DC

## 2019-02-02 MED ORDER — SODIUM ZIRCONIUM CYCLOSILICATE 5 G PO PACK
5.0000 g | PACK | Freq: Once | ORAL | Status: DC
  Filled 2019-02-02: qty 1

## 2019-02-02 MED ORDER — BOOST / RESOURCE BREEZE PO LIQD CUSTOM
1.0000 | Freq: Three times a day (TID) | ORAL | Status: DC
  Administered 2019-02-02 – 2019-02-06 (×8): 1 via ORAL

## 2019-02-02 MED ORDER — SODIUM CHLORIDE 0.9 % IV BOLUS
500.0000 mL | Freq: Once | INTRAVENOUS | Status: AC
  Administered 2019-02-02: 500 mL via INTRAVENOUS

## 2019-02-02 MED ORDER — VITAMIN K1 10 MG/ML IJ SOLN
10.0000 mg | Freq: Every day | INTRAMUSCULAR | Status: AC
  Administered 2019-02-03 – 2019-02-04 (×2): 10 mg via SUBCUTANEOUS
  Filled 2019-02-02 (×3): qty 1

## 2019-02-02 MED ORDER — ALBUMIN HUMAN 5 % IV SOLN
25.0000 g | Freq: Once | INTRAVENOUS | Status: AC
  Administered 2019-02-02: 25 g via INTRAVENOUS
  Filled 2019-02-02: qty 500

## 2019-02-02 NOTE — Progress Notes (Signed)
Spoke with patient and reminded him about his upcoming [facility] appointment. He says that he has scales at home but wasn't weighing daily. Explained the importance of weighing daily and calling for an overnight weight gain of >2 pounds or a weekly weight gain of >5 pounds.

## 2019-02-02 NOTE — Progress Notes (Signed)
[facility] Encounter Note  Patient: [name] / Admit Date: 01/30/2019 / Date of Encounter: 02/02/2019, 8:30 AM   Subjective: Patient is still short of breath with some chest fullness of unknown etiology but claims that this is significantly improved this morning from yesterday..  No evidence of significant amount of rails or pulmonary edema by chest x-ray.  Echocardiogram does show some pulmonary hypertension possibly contributing to edema secondary to possible respiratory abnormality.  Patient does have some worsening chronic kidney disease likely secondary to a slight overdiuresis.  Troponin elevation of 28 consistent with demand ischemia rather than acute coronary syndrome  Review of Systems: Positive for: Shortness of breath Negative for: Vision change, hearing change, syncope, dizziness, nausea, vomiting,diarrhea, bloody stool, stomach pain, cough, congestion, diaphoresis, urinary frequency, urinary pain,skin lesions, skin rashes Others previously listed  Objective: Telemetry: Normal sinus rhythm Physical Exam: Blood pressure 106/73, pulse 80, temperature (!) 97.5 F (36.4 C), resp. rate 18, height 5\' 8"  (1.727 m), weight 71.5 kg, SpO2 99 %. Body mass index is 23.96 kg/m. General: Well developed, well nourished, in no acute distress. Head: Normocephalic, atraumatic, sclera non-icteric, no xanthomas, nares are without discharge. Neck: No apparent masses Lungs: Normal respirations with few wheezes, no rhonchi, no rales , no crackles   Heart: Regular rate and rhythm, normal S1 S2, right upper sternal murmur, no rub, no gallop, PMI is normal size and placement, carotid upstroke normal without bruit, jugular venous pressure normal Abdomen: Soft, non-tender, non-distended with normoactive bowel sounds. No hepatosplenomegaly. Abdominal aorta is normal size without bruit Extremities: Trace edema, no clubbing, no cyanosis, no ulcers,  Peripheral: 2+ radial, 2+  femoral, 2+ dorsal pedal pulses Neuro: Alert and oriented. Moves all extremities spontaneously. Psych:  Responds to questions appropriately with a normal affect.   Intake/Output Summary (Last 24 hours) at 02/02/2019 0830 Last data filed at 02/02/2019 0010 Gross per 24 hour  Intake -  Output 0 ml  Net 0 ml    Inpatient Medications:  . acetaminophen  1,000 mg Oral Q8H  . aspirin EC  325 mg Oral Daily  . atorvastatin  40 mg Oral Daily  . FLUoxetine  20 mg Oral BH-q7a  . fluticasone  2 spray Each Nare Daily  . folic acid  1 mg Oral Daily  . furosemide  40 mg Oral Daily  . gabapentin  300 mg Oral TID  . heparin  5,000 Units Subcutaneous Q8H  . ipratropium-albuterol  3 mL Nebulization Q6H  . mouth rinse  15 mL Mouth Rinse BID  . meloxicam  15 mg Oral Daily  . metoprolol tartrate  25 mg Oral Daily  . mirtazapine  15 mg Oral QHS  . multivitamin with minerals  1 tablet Oral Daily  . naproxen  500 mg Oral BID WC  . pneumococcal 23 valent vaccine  0.5 mL Intramuscular Tomorrow-1000  . polyethylene glycol  17 g Oral Daily  . sodium chloride flush  3 mL Intravenous Q12H  . thiamine  100 mg Oral Daily   Or  . thiamine  100 mg Intravenous Daily   Infusions:  . sodium chloride      Labs: Recent Labs    02/01/19 2323 02/02/19 0525  NA 136 136  K 5.9* 5.3*  CL 99 101  CO2 22 24  GLUCOSE 91 104*  BUN 54* 62*  CREATININE 3.17* 3.51*  CALCIUM 8.8* 8.8*  MG 2.2  --   PHOS 7.4*  --    Recent Labs  02/01/19 2323 02/02/19 0525  AST 531* 1,318*  ALT 274* 624*  ALKPHOS 158* 149*  BILITOT 2.3* 2.2*  PROT 6.5 6.6  ALBUMIN 3.2* 3.1*   Recent Labs    01/30/19 1600 02/01/19 2323 02/02/19 0525  WBC 7.1 12.3* 10.7*  NEUTROABS 4.5  --  7.7  HGB 12.0* 12.9* 13.1  HCT 37.3* 40.5 40.2  MCV 80.4 80.7 78.8*  PLT 216 207 195   No results for input(s): CKTOTAL, CKMB, TROPONINI in the last 72 hours. Invalid input(s): POCBNP No results for input(s): HGBA1C in the last 72  hours.   Weights: Filed Weights   01/31/19 0514 02/01/19 0353 02/02/19 0444  Weight: 69.9 kg 70.6 kg 71.5 kg     Radiology/Studies:  Dg Chest 2 View  Result Date: 01/21/2019 CLINICAL DATA:  Dyspnea for few weeks with left lower extremity swelling EXAM: CHEST - 2 VIEW COMPARISON:  01/08/2019 chest radiograph. FINDINGS: Intact sternotomy wires. Mitral annuloplasty ring in place. Stable cardiomediastinal silhouette with mild cardiomegaly. No pneumothorax. Small right pleural effusion is increased. No left pleural effusion. Mild pulmonary edema. Patchy right greater than left bibasilar lung opacities worsened on the right. IMPRESSION: 1. Mild congestive heart failure with increased small right pleural effusion. 2. Patchy bibasilar lung opacities, right greater than left, worsened on the right, favor atelectasis, cannot exclude a component of aspiration or pneumonia. Chest radiograph follow-up advised. Electronically Signed   By: Ilona Sorrel M.D.   On: 01/21/2019 17:28   Dg Chest 2 View  Result Date: 01/08/2019 CLINICAL DATA:  Pulmonary embolism. Hx of copd, smoker EXAM: CHEST - 2 VIEW COMPARISON:  Chest radiograph 01/06/2019 FINDINGS: Stable cardiomediastinal contours with enlarged heart size. Surgical changes status post valve replacement. Stable pleuroparenchymal opacity at the right lung base with fluid extending into the minor fissure. Vascular congestion without overt edema. No acute finding in the visualized skeleton. IMPRESSION: Stable pleuroparenchymal opacity at the right lung base with fluid extending into the minor fissure. Electronically Signed   By: Audie Pinto M.D.   On: 01/08/2019 08:37   Dg Chest 2 View  Result Date: 01/06/2019 CLINICAL DATA:  Shortness of breath. EXAM: CHEST - 2 VIEW COMPARISON:  Radiograph 12/28/2018 FINDINGS: Post median sternotomy with prosthetic valve. Similar cardiomegaly and mediastinal contours. Worsening pleuroparenchymal opacity at the right lung  base with increasing pleural effusion. Probable fluid in the minor fissure. There is vascular congestion without overt pulmonary edema. No pneumothorax. Unchanged retrocardiac atelectasis. IMPRESSION: 1. Increasing right pleuroparenchymal opacity and pleural effusion over the past 2 days. 2. Cardiomegaly with vascular congestion. Electronically Signed   By: Keith Rake M.D.   On: 01/06/2019 20:04   Dg Chest Portable 1 View  Result Date: 01/30/2019 CLINICAL DATA:  Shortness of breath EXAM: PORTABLE CHEST 1 VIEW COMPARISON:  01/21/2019 FINDINGS: No significant interval change in AP portable examination with small right pleural effusion and associated atelectasis or consolidation. The left lung is normally aerated. Cardiomegaly status post median sternotomy. IMPRESSION: 1. No significant interval change in AP portable examination with small right pleural effusion and associated atelectasis or consolidation. The left lung is normally aerated. 2.  Cardiomegaly status post median sternotomy. Electronically Signed   By: Eddie Candle M.D.   On: 01/30/2019 16:41   Dg Abd 2 Views  Result Date: 01/31/2019 CLINICAL DATA:  Abdominal pain and distension EXAM: ABDOMEN - 2 VIEW COMPARISON:  None. FINDINGS: Nonobstructive pattern of bowel gas with gas present to the rectum. Scattered stool present throughout the colon.  No free air in the abdomen. Cardiomegaly in the included lower chest, better assessed by dedicated chest radiograph. 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 IMPRESSION: Nonobstructive pattern of bowel gas with gas present to the rectum. Scattered stool present throughout the colon. No free air in the abdomen. Electronically Signed   By: Lauralyn PrimesAlex  Bibbey M.D.   On: 01/31/2019 17:32   [place] Abdomen Limited Ruq  Result Date: 02/01/2019 CLINICAL DATA:  Cirrhosis and abdominal distension EXAM: ULTRASOUND ABDOMEN LIMITED RIGHT UPPER QUADRANT COMPARISON:  None. FINDINGS:  Gallbladder: No gallstones visualized. The gallbladder wall is diffusely thickened measuring up to 0.7 cm. No sonographic Murphy sign noted by sonographer. Common bile duct: Diameter: 0.4 cm Liver: No focal lesion identified. The liver contour is mildly nodular. Within normal limits in parenchymal echogenicity. Portal vein is patent on color Doppler imaging with normal direction of blood flow towards the liver. Other: Mild right upper quadrant ascites. Small right pleural effusion. IMPRESSION: 1. Mildly nodular contour of the liver consistent with history of cirrhosis. Mild right upper quadrant ascites and small pleural effusion visualized. 2. Gallbladder wall thickening which is a nonspecific finding and can be seen in the setting of gallbladder inflammation, cirrhosis, fluid overload states, hypoalbuminemia, among others. No gallstones visualized. Electronically Signed   By: Emmaline KluverNancy  Ballantyne M.D.   On: 02/01/2019 09:11     Assessment and Recommendation  58 y.o. male with acute on chronic diastolic dysfunction heart failure with elevated BNP and demand ischemia by above with elevated troponin slightly improved with intravenous Lasix but still with concerns of shortness of breath and abdominal fullness 1.  Continue Lasix for abdominal fullness possible pulmonary hypertension and fluid retention with acute on chronic diastolic dysfunction heart failure but changed over to oral Lasix to reduce the possibility of continued worsening chronic kidney disease 2.  No further cardiac diagnostics due to no evidence of acute coronary syndrome 3.  Further evaluation of possible pulmonary etiology of symptoms as per pulmonology if necessary 4.  Continue beta-blocker for heart rate control and heart failure 5.  Begin ambulation and follow for improvements of symptoms and possible discharge home from cardiovascular standpoint with follow-up next week for further adjustments of medication management  Signed, Arnoldo HookerBruce  Jamarques Pinedo M.D. FACC

## 2019-02-02 NOTE — Procedures (Signed)
Interventional Radiology Procedure:   Indications: Ascites  Procedure: US guided paracentesis  Findings: Removed 650 ml from RLQ quadrant  Complications: None     EBL: Less than 10 ml   Merel Santoli R. Anselm Pancoast, MD  Pager: [phone]

## 2019-02-02 NOTE — Consult Note (Signed)
Mike Darby, MD [address]  [place]  [place], [place] [address]  Main: [phone]  Fax: [phone] Pager: [phone]   Consultation  Referring Provider:     No ref. provider found Primary Care Physician:  [facility] [place] Primary Gastroenterologist: Althia Forts        Reason for Consultation:   Cirrhosis of liver  Date of Admission:  01/30/2019 Date of Consultation:  02/02/2019         HPI:   [name] is a 58 y.o. male with history of diastolic heart failure, mild pulmonary hypertension, bipolar, COPD who is admitted on 10/24 with swelling of legs, abdominal distention as well as shortness of breath.  Patient is found to be volume overloaded and was aggressively diuresed.  He has history of active alcohol use, acknowledges drinking 2, 40 ounces of beer daily for last 2 years.  He was incarcerated about 2 years ago, ever since he is out of prison, he started drinking alcohol.  He also acknowledges taking Tylenol and ibuprofen regularly for chronic pain issue.  He also reports being treated for hep C with Harvoni about 3 years ago.  Patient had right upper quadrant ultrasound yesterday due to worsening LFTs since admission and was found to have nodular liver and subsequently underwent underwent paracentesis with 650 mL of yellow fluid removed.  Fluid analysis consistent with portal hypertension.  GI is consulted for further evaluation.  He is LFTs AST 90, ALT 64, total bilirubin 1.7, alkaline phosphatase 121 on 10/24, increased to AST 531, ALT 274, total bilirubin 2.3, alkaline phosphatase 538 yesterday and further worsened to AST 1318, ALT 624, total bilirubin 2.2, alkaline phosphatase 149 today.  PT/INR 24.9/2.3.  Patient has chronically elevated LFTs for several years.  In the interim, patient had worsening kidney function, diuretics were held. No evidence of melena, vomiting, abdominal pain, rectal bleeding, hematemesis or coffee-ground emesis.  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 Alcohol abuse   . Bipolar 1 disorder ([place])   . CHF (congestive heart failure) ([place])   . COPD (chronic obstructive pulmonary disease) ([place])   . Hip pain   . Hypertension   . Pulmonary HTN ([place])   . Valvular heart disease     Past Surgical History:  Procedure Laterality Date  . AORTIC VALVE REPLACEMENT      Prior to Admission medications   Medication Sig Start Date End Date Taking? Authorizing Provider  atorvastatin (LIPITOR) 40 MG tablet Take 40 mg by mouth daily. 10/30/18 10/30/19 Yes [provider]  azithromycin (ZITHROMAX) 250 MG tablet Take as directed on package. 01/28/19  Yes Darylene Price A, FNP  FLUoxetine (PROZAC) 20 MG capsule Take 20 mg by mouth every morning. 08/26/18  Yes [provider]  furosemide (LASIX) 40 MG tablet Take 1 tablet (40 mg total) by mouth daily. 01/28/19 01/28/20 Yes Hackney, [name] A, FNP  gabapentin (NEURONTIN) 300 MG capsule Take 300 mg by mouth 3 (three) times daily. 04/14/18 04/14/19 Yes [provider]  ipratropium (ATROVENT HFA) 17 MCG/ACT inhaler Inhale 2 puffs into the  lungs 4 (four) times daily. 01/27/19 01/27/20 Yes [provider]  meloxicam (MOBIC) 15 MG tablet Take 15 mg by mouth daily. 10/30/18  Yes [provider]  metoprolol tartrate (LOPRESSOR) 25 MG tablet Take 25 mg by mouth daily. 08/26/18  Yes [provider]  mirtazapine (REMERON) 15 MG tablet Take 15 mg by mouth at bedtime. 09/08/18 09/08/19 Yes [provider]  potassium chloride 20  MEQ TBCR Take 20 mEq by mouth daily. 01/28/19  Yes Hackney, [name] A, FNP  acetaminophen (TYLENOL) 500 MG tablet Take 1,000 mg by mouth every 8 (eight) hours. 10/30/18 10/30/19  [provider]  albuterol (VENTOLIN HFA) 108 (90 Base) MCG/ACT inhaler Inhale 2 puffs into the lungs every 4 (four) hours as needed for wheezing or shortness of breath. 01/21/19   Duffy Bruce, MD  aspirin 325 MG tablet Take 325 mg by mouth daily.    [provider]  DULoxetine (CYMBALTA) 30 MG capsule Take 30 mg by mouth daily. 09/08/18   [provider]  naproxen (NAPROSYN) 500 MG tablet Take 500 mg by mouth 2 (two) times daily with a meal.    [provider]   Current Facility-Administered Medications:  .  0.9 %  sodium chloride infusion, 250 mL, Intravenous, PRN, [name], [name], NP .  acetaminophen (TYLENOL) tablet 650 mg, 650 mg, Oral, Q4H PRN, [name], [name], NP .  albumin human 5 % solution 25 g, 25 g, Intravenous, Once, [name], Tally Due, MD .  bisacodyl (DULCOLAX) EC tablet 10 mg, 10 mg, Oral, Daily PRN, [name], [name], MD .  feeding supplement (BOOST / RESOURCE BREEZE) liquid 1 Container, 1 Container, Oral, TID BM, [name], Tally Due, MD .  fluticasone (FLONASE) 50 MCG/ACT nasal spray 2 spray, 2 spray, Each Nare, Daily, [name], [name], MD, 2 spray at 02/02/19 1007 .  folic acid (FOLVITE) tablet 1 mg, 1 mg, Oral, Daily, [name], [name], NP, 1 mg at 02/02/19 0956 .  heparin injection 5,000 Units, 5,000 Units, Subcutaneous, Q8H, [name], [name], NP, 5,000 Units at 02/02/19 1443 .  ipratropium-albuterol (DUONEB) 0.5-2.5 (3) MG/3ML nebulizer solution 3 mL, 3 mL, Nebulization, Q6H, [name], [name], MD, 3 mL at 02/02/19 0738 .  LORazepam (ATIVAN) tablet 1-4 mg, 1-4 mg, Oral, Q1H PRN **OR** LORazepam (ATIVAN) injection 1-4 mg, 1-4 mg, Intravenous, Q1H PRN, Lang Snow, NP .  MEDLINE mouth rinse, 15 mL, Mouth Rinse, BID, [name], [name], NP, 15 mL at 02/01/19 2217 .  mirtazapine (REMERON) tablet 15 mg, 15 mg, Oral, QHS, [name], [name], NP, 15 mg at 02/01/19 2212 .  multivitamin with minerals tablet 1 tablet, 1 tablet, Oral, Daily, Lang Snow, NP, 1 tablet at 02/02/19 [phone] .  ondansetron (ZOFRAN) injection 4 mg, 4 mg, Intravenous, Q6H PRN, Lang Snow, NP, 4 mg at 02/02/19 1444 .  phytonadione (VITAMIN K) SQ injection 10 mg, 10 mg, Subcutaneous, Daily, [name], Tally Due, MD .  pneumococcal 23 valent vaccine (PNU-IMMUNE) injection 0.5 mL, 0.5 mL, Intramuscular, Tomorrow-1000, [name], [name], NP .  polyethylene glycol (MIRALAX / GLYCOLAX) packet 17 g, 17 g, Oral, Daily, [name], [name], MD, 17 g at 02/02/19 0959 .  sodium chloride (OCEAN) 0.65 % nasal spray 1 spray, 1 spray, Each Nare, PRN, [name], [name], MD .  sodium chloride 0.9 % bolus 500 mL, 500 mL, Intravenous, Once, [name], Tally Due, MD .  sodium chloride flush (NS) 0.9 % injection 3 mL, 3 mL, Intravenous, Q12H, [name], [name], NP, 3 mL  at 02/02/19 1444 .  sodium chloride flush (NS) 0.9 % injection 3 mL, 3 mL, Intravenous, PRN, Lang Snow, NP, 3 mL at 02/01/19 1355 .  sodium zirconium cyclosilicate (LOKELMA) packet 5 g, 5 g, Oral, Once, [name], [name], MD .  thiamine (VITAMIN B-1) tablet 100 mg, 100 mg, Oral, Daily, 100 mg at 02/02/19 0955 **OR** thiamine (B-1) injection 100 mg, 100 mg, Intravenous, Daily, [name], [name], NP   History reviewed. No pertinent family history.   Social History   Tobacco Use  . Smoking status: Current Every Day Smoker    Packs/day: 1.00    Types: Cigarettes  . Smokeless tobacco: Never Used  Substance Use Topics  . Alcohol use: Yes  . Drug use: Yes    Types: Cocaine    Allergies as of 01/30/2019  . (No Known Allergies)    Review of Systems:    All systems reviewed and negative except where noted in HPI.   Physical Exam:  Vital signs in last 24  hours: Temp:  [97.4 F (36.3 C)-97.5 F (36.4 C)] 97.5 F (36.4 C) (10/27 0824) Pulse Rate:  [66-139] 66 (10/27 1441) Resp:  [18] 18 (10/27 1435) BP: (95-111)/(46-75) 100/64 (10/27 1441) SpO2:  [93 %-100 %] 100 % (10/27 1441) Weight:  [71.5 kg] 71.5 kg (10/27 0444) Last BM Date: 01/31/19 General:   Pleasant, cooperative in NAD Head:  Normocephalic and atraumatic. Eyes:   No icterus.   Conjunctiva pink. PERRLA. Ears:  Normal auditory acuity. Neck:  Supple; no masses or thyroidomegaly Lungs: Respirations even and unlabored. Lungs clear to auscultation bilaterally.   No wheezes, crackles, or rhonchi.  Heart:  Regular rate and rhythm;  Without murmur, clicks, rubs or gallops Abdomen:  Soft, nondistended, nontender. Normal bowel sounds. No appreciable masses or hepatomegaly.  No rebound or guarding.  Rectal:  Not performed. Msk:  Symmetrical without gross deformities.  Strength normal Extremities:  Without edema, cyanosis or clubbing. Neurologic:  Alert and oriented x3;  grossly normal neurologically. Skin: Dry mucous membranes, dry skin, intact without significant lesions or rashes. Psych:  Alert and cooperative. Normal affect.  LAB RESULTS: CBC Latest Ref Rng & Units 02/02/2019 02/01/2019 01/30/2019  WBC 4.0 - 10.5 K/uL 10.7(H) 12.3(H) 7.1  Hemoglobin 13.0 - 17.0 g/dL 13.1 12.9(L) 12.0(L)  Hematocrit 39.0 - 52.0 % 40.2 40.5 37.3(L)  Platelets 150 - 400 K/uL 195 207 216    BMET BMP Latest Ref Rng & Units 02/02/2019 02/01/2019 02/01/2019  Glucose 70 - 99 mg/dL 104(H) 91 95  BUN 6 - 20 mg/dL 62(H) 54(H) 43(H)  Creatinine 0.61 - 1.24 mg/dL 3.51(H) 3.17(H) 2.32(H)  Sodium 135 - 145 mmol/L 136 136 138  Potassium 3.5 - 5.1 mmol/L 5.3(H) 5.9(H) 4.4  Chloride 98 - 111 mmol/L 101 99 104  CO2 22 - 32 mmol/L _0 Calcium 8.9 - 10.3 mg/dL 8.8(L) 8.8(L) 8.5(L)    LFT Hepatic Function Latest Ref Rng & Units 02/02/2019 02/01/2019 01/30/2019  Total Protein 6.5 - 8.1 g/dL 6.6 6.5 6.9   Albumin 3.5 - 5.0 g/dL 3.1(L) 3.2(L) 3.5  AST 15 - 41 U/L 1,318(H) 531(H) 90(H)  ALT 0 - 44 U/L 624(H) 274(H) 64(H)  Alk Phosphatase 38 - 126 U/L 149(H) 158(H) 121  Total Bilirubin 0.3 - 1.2 mg/dL 2.2(H) 2.3(H) 1.7(H)     STUDIES: US Paracentesis  Result Date: 02/02/2019 INDICATION: 58 year old with ascites and abdominal distention. EXAM: ULTRASOUND GUIDED PARACENTESIS MEDICATIONS: None. COMPLICATIONS: None immediate. PROCEDURE: Informed written consent was  obtained from the patient after a discussion of the risks, benefits and alternatives to treatment. A timeout was performed prior to the initiation of the procedure. Initial ultrasound scanning demonstrates a small amount of ascites within the right lower abdominal quadrant. The right lower abdomen was prepped and draped in the usual sterile fashion. 1% lidocaine was used for local anesthesia. Following this, a 19 gauge, 7-cm, Yueh catheter was introduced. An ultrasound image was saved for documentation purposes. The paracentesis was performed. The catheter was removed and a dressing was applied. The patient tolerated the procedure well without immediate post procedural complication. FINDINGS: A total of approximately 650 mL of yellow fluid was removed. Samples were sent to the laboratory as requested by the clinical team. IMPRESSION: Successful ultrasound-guided paracentesis yielding 650 mL of peritoneal fluid. Electronically Signed   By: Markus Daft M.D.   On: 02/02/2019 14:37   US Abdomen Limited Ruq  Result Date: 02/01/2019 CLINICAL DATA:  Cirrhosis and abdominal distension EXAM: ULTRASOUND ABDOMEN LIMITED RIGHT UPPER QUADRANT COMPARISON:  None. FINDINGS: Gallbladder: No gallstones visualized. The gallbladder wall is diffusely thickened measuring up to 0.7 cm. No sonographic Murphy sign noted by sonographer. Common bile duct: Diameter: 0.4 cm Liver: No focal lesion identified. The liver contour is mildly nodular. Within normal limits in  parenchymal echogenicity. Portal vein is patent on color Doppler imaging with normal direction of blood flow towards the liver. Other: Mild right upper quadrant ascites. Small right pleural effusion. IMPRESSION: 1. Mildly nodular contour of the liver consistent with history of cirrhosis. Mild right upper quadrant ascites and small pleural effusion visualized. 2. Gallbladder wall thickening which is a nonspecific finding and can be seen in the setting of gallbladder inflammation, cirrhosis, fluid overload states, hypoalbuminemia, among others. No gallstones visualized. Electronically Signed   By: Audie Pinto M.D.   On: 02/01/2019 09:11      Impression / Plan:   [name] is a 58 y.o. male with history of alcohol abuse, tobacco use, chronic hep C s/p treatment with Harvoni, diastolic heart failure, bipolar, COPD, pulmonary hypertension who is admitted with volume overload and ascites.  Hospital course complicated by worsening LFTs, acute kidney injury, with new diagnosis of cirrhosis  Elevated LFTs, predominantly transaminases Right upper quadrant ultrasound does not reveal any biliary obstruction Patent portal vein vasculature Probably acute on chronic alcoholic liver disease and DILI with excess NSAID and Tylenol use Agree with viral hepatitis panel Recommend IV hydration and adequate oral nutrition Continue multivitamin plus thiamine and folate daily Recommend vitamin K 10 mg subcu daily for 3 days due to acute liver failure Monitor LFTs closely Maddrey's discriminant score 46.8 which indicates that the mortality rate approaching 50%, may need to consider prednisolone due to acute alcoholic hepatitis Complete abstinence from alcohol, avoid excess NSAIDs and Tylenol use Avoid hepatotoxic agents, discontinued aspirin 325, Tylenol Recommend to hold psychotropic agents as well, discontinued  Ascites, status post paracentesis No evidence of SBP Total protein less than 3, SAAG > 1.1  consistent with portal hypertension Hold diuretics due to AKI  AKI Recommend nephrology consult Recommend to give fluid challenge, normal saline and IV albumin, 25 g of 5% albumin ordered Diuretics on hold, recommend to hold beta-blockers as well  Thank you for involving me in the care of this patient.  We will follow along with you    LOS: 3 days   Sherri Sear, MD  02/02/2019, 6:35 PM   Note: This dictation was prepared with Dragon dictation along  with smaller phrase technology. Any transcriptional errors that result from this process are unintentional.

## 2019-02-02 NOTE — Consult Note (Signed)
Pulmonary Medicine          Date: 02/02/2019,   MRN# [id number] [name] 01/31/1961     AdmissionWeight: 71.7 kg                 CurrentWeight: 71.5 kg      CHIEF COMPLAINT:   Acute hypoxemic respiratory failure and pulmonary hypertension   HISTORY OF PRESENT ILLNESS   This is a pleasant 58 year old male with a history of alcohol abuse, bipolar disorder CHF with preserved EF, COPD essential hypertension pulmonary hypertension who came in due to worsening dyspnea and shortness of breath post hospital discharge.  Patient reports that he has had worsening lower extremity edema as well as nausea vomiting cough.  When he arrived in the ED he was hemodynamically stable however required 4 L/min nasal cannula had electrolyte imbalances including hyperkalemia uremia, AKI with creatinine of 1.27 and mild transaminitis as well as a BNP of 1234 and a very mild troponin elevation at 28 x-ray at that time showed small right pleural effusion with bibasilar atelectasis.  He received IV Lasix at that time and improved.  Patient was additionally evaluated by cardiology has been optimized medically and is significantly improved with resolution of lower extremity edema.  Pulmonary consultation was placed by hospitalist Dr. Amado CoeGouru due to findings of pulmonary arterial hypertension.  During my evaluation patient states that he has had thick phlegm and is having difficulty bringing it up.  He states that he coughs most of each day.  On auscultation he does have right lower lobe crepitations.  Additionally patient states that he received new medication and has been feeling drunk for the past 2 days which I assume he may be referring to either Ativan or Remeron.   PAST MEDICAL HISTORY   Past Medical History:  Diagnosis Date  . Alcohol abuse   . Bipolar 1 disorder (HCC)   . CHF (congestive heart failure) (HCC)   . COPD (chronic obstructive pulmonary disease) (HCC)   . Hip pain   .  Hypertension   . Pulmonary HTN (HCC)   . Valvular heart disease      SURGICAL HISTORY   Past Surgical History:  Procedure Laterality Date  . AORTIC VALVE REPLACEMENT       FAMILY HISTORY   History reviewed. No pertinent family history.   SOCIAL HISTORY   Social History   Tobacco Use  . Smoking status: Current Every Day Smoker    Packs/day: 1.00    Types: Cigarettes  . Smokeless tobacco: Never Used  Substance Use Topics  . Alcohol use: Yes  . Drug use: Yes    Types: Cocaine     MEDICATIONS    Home Medication:    Current Medication:  Current Facility-Administered Medications:  .  0.9 %  sodium chloride infusion, 250 mL, Intravenous, PRN, [name], [name], NP .  acetaminophen (TYLENOL) tablet 1,000 mg, 1,000 mg, Oral, Q8H, [name], [name], NP, 1,000 mg at 02/02/19 0545 .  acetaminophen (TYLENOL) tablet 650 mg, 650 mg, Oral, Q4H PRN, [name], [name], NP .  aspirin EC tablet 325 mg, 325 mg, Oral, Daily, [name], [name], NP, 325 mg at 02/02/19 0956 .  atorvastatin (LIPITOR) tablet 40 mg, 40 mg, Oral, Daily, [name], [name], NP, 40 mg at 02/01/19 1718 .  bisacodyl (DULCOLAX) EC tablet 10 mg, 10 mg, Oral, Daily PRN, [name], [name], MD .  Melene Muller[START ON 02/03/2019] DULoxetine (CYMBALTA) DR capsule 30 mg, 30 mg, Oral,  Daily, [name], [name], MD .  FLUoxetine (PROZAC) capsule 20 mg, 20 mg, Oral, BH-q7a, [name], [name], NP, 20 mg at 02/02/19 0545 .  fluticasone (FLONASE) 50 MCG/ACT nasal spray 2 spray, 2 spray, Each Nare, Daily, [name], [name], MD, 2 spray at 02/02/19 1007 .  folic acid (FOLVITE) tablet 1 mg, 1 mg, Oral, Daily, [name], [name], NP, 1 mg at 02/02/19 0956 .  furosemide (LASIX) tablet 40 mg, 40 mg, Oral, Daily, [name], [name], MD, 40 mg at 02/02/19 0955 .  gabapentin (NEURONTIN) capsule 300 mg, 300 mg, Oral, TID, [name], [name], NP, 300 mg at 02/02/19 0956 .  heparin injection 5,000 Units, 5,000 Units,  Subcutaneous, Q8H, [name], [name], NP, 5,000 Units at 02/02/19 0545 .  ipratropium-albuterol (DUONEB) 0.5-2.5 (3) MG/3ML nebulizer solution 3 mL, 3 mL, Nebulization, Q6H, [name], [name], MD, 3 mL at 02/02/19 0738 .  LORazepam (ATIVAN) tablet 1-4 mg, 1-4 mg, Oral, Q1H PRN **OR** LORazepam (ATIVAN) injection 1-4 mg, 1-4 mg, Intravenous, Q1H PRN, Lang Snow, NP .  MEDLINE mouth rinse, 15 mL, Mouth Rinse, BID, [name], [name], NP, 15 mL at 02/01/19 2217 .  metoprolol tartrate (LOPRESSOR) tablet 25 mg, 25 mg, Oral, Daily, [name], [name], NP, 25 mg at 02/02/19 0956 .  mirtazapine (REMERON) tablet 15 mg, 15 mg, Oral, QHS, [name], [name], NP, 15 mg at 02/01/19 2212 .  multivitamin with minerals tablet 1 tablet, 1 tablet, Oral, Daily, Lang Snow, NP, 1 tablet at 02/02/19 [phone] .  ondansetron (ZOFRAN) injection 4 mg, 4 mg, Intravenous, Q6H PRN, Lang Snow, NP, 4 mg at 02/01/19 1350 .  pneumococcal 23 valent vaccine (PNU-IMMUNE) injection 0.5 mL, 0.5 mL, Intramuscular, Tomorrow-1000, [name], [name], NP .  polyethylene glycol (MIRALAX / GLYCOLAX) packet 17 g, 17 g, Oral, Daily, [name], [name], MD, 17 g at 02/02/19 0959 .  sodium chloride (OCEAN) 0.65 % nasal spray 1 spray, 1 spray, Each Nare, PRN, [name], [name], MD .  sodium chloride flush (NS) 0.9 % injection 3 mL, 3 mL, Intravenous, Q12H, [name], [name], NP, 3 mL at 02/01/19 2217 .  sodium chloride flush (NS) 0.9 % injection 3 mL, 3 mL, Intravenous, PRN, Lang Snow, NP, 3 mL at 02/01/19 1355 .  thiamine (VITAMIN B-1) tablet 100 mg, 100 mg, Oral, Daily, 100 mg at 02/02/19 0955 **OR** thiamine (B-1) injection 100 mg, 100 mg, Intravenous, Daily, [name], [name], NP    ALLERGIES   Patient has no known allergies.     REVIEW OF SYSTEMS    Review of Systems:  Gen:  Denies  fever, sweats, chills weigh loss  HEENT: Denies blurred vision,  double vision, ear pain, eye pain, hearing loss, nose bleeds, sore throat Cardiac:  No dizziness, chest pain or heaviness, chest tightness,edema Resp:   Denies cough or sputum porduction, shortness of breath,wheezing, hemoptysis,  Gi: Denies swallowing difficulty, stomach pain, nausea or vomiting, diarrhea, constipation, bowel incontinence Gu:  Denies bladder incontinence, burning urine Ext:   Denies Joint pain, stiffness or swelling Skin: Denies  skin rash, easy bruising or bleeding or hives Endoc:  Denies polyuria, polydipsia , polyphagia or weight change Psych:   Denies depression, insomnia or hallucinations   Other:  All other systems negative   VS: BP 106/73 (BP Location: Left Arm)   Pulse 80   Temp (!) 97.5 F (36.4 C)   Resp 18   Ht 5\' 8"  (1.727 m)   Wt 71.5 kg   SpO2 94%  BMI 23.96 kg/m      PHYSICAL EXAM    GENERAL:NAD, no fevers, chills, no weakness no fatigue HEAD: Normocephalic, atraumatic.  EYES: Pupils equal, round, reactive to light. Extraocular muscles intact. No scleral icterus.  MOUTH: Moist mucosal membrane. Dentition intact. No abscess noted.  EAR, NOSE, THROAT: Clear without exudates. No external lesions.  NECK: Supple. No thyromegaly. No nodules. No JVD.  PULMONARY: Diffuse coarse rhonchi right sided +wheezes CARDIOVASCULAR: S1 and S2. Regular rate and rhythm. No murmurs, rubs, or gallops. No edema. Pedal pulses 2+ bilaterally.  GASTROINTESTINAL: Soft, nontender, nondistended. No masses. Positive bowel sounds. No hepatosplenomegaly.  MUSCULOSKELETAL: No swelling, clubbing, or edema. Range of motion full in all extremities.  NEUROLOGIC: Cranial nerves II through XII are intact. No gross focal neurological deficits. Sensation intact. Reflexes intact.  SKIN: No ulceration, lesions, rashes, or cyanosis. Skin warm and dry. Turgor intact.  PSYCHIATRIC: Mood, affect within normal limits. The patient is awake, alert and oriented x 3. Insight, judgment intact.        IMAGING    Dg Chest 2 View  Result Date: 01/21/2019 CLINICAL DATA:  Dyspnea for few weeks with left lower extremity swelling EXAM: CHEST - 2 VIEW COMPARISON:  01/08/2019 chest radiograph. FINDINGS: Intact sternotomy wires. Mitral annuloplasty ring in place. Stable cardiomediastinal silhouette with mild cardiomegaly. No pneumothorax. Small right pleural effusion is increased. No left pleural effusion. Mild pulmonary edema. Patchy right greater than left bibasilar lung opacities worsened on the right. IMPRESSION: 1. Mild congestive heart failure with increased small right pleural effusion. 2. Patchy bibasilar lung opacities, right greater than left, worsened on the right, favor atelectasis, cannot exclude a component of aspiration or pneumonia. Chest radiograph follow-up advised. Electronically Signed   By: Delbert Phenix M.D.   On: 01/21/2019 17:28   Dg Chest 2 View  Result Date: 01/08/2019 CLINICAL DATA:  Pulmonary embolism. Hx of copd, smoker EXAM: CHEST - 2 VIEW COMPARISON:  Chest radiograph 01/06/2019 FINDINGS: Stable cardiomediastinal contours with enlarged heart size. Surgical changes status post valve replacement. Stable pleuroparenchymal opacity at the right lung base with fluid extending into the minor fissure. Vascular congestion without overt edema. No acute finding in the visualized skeleton. IMPRESSION: Stable pleuroparenchymal opacity at the right lung base with fluid extending into the minor fissure. Electronically Signed   By: Emmaline Kluver M.D.   On: 01/08/2019 08:37   Dg Chest 2 View  Result Date: 01/06/2019 CLINICAL DATA:  Shortness of breath. EXAM: CHEST - 2 VIEW COMPARISON:  Radiograph 12/28/2018 FINDINGS: Post median sternotomy with prosthetic valve. Similar cardiomegaly and mediastinal contours. Worsening pleuroparenchymal opacity at the right lung base with increasing pleural effusion. Probable fluid in the minor fissure. There is vascular congestion without overt  pulmonary edema. No pneumothorax. Unchanged retrocardiac atelectasis. IMPRESSION: 1. Increasing right pleuroparenchymal opacity and pleural effusion over the past 2 days. 2. Cardiomegaly with vascular congestion. Electronically Signed   By: Narda Rutherford M.D.   On: 01/06/2019 20:04   Dg Chest Portable 1 View  Result Date: 01/30/2019 CLINICAL DATA:  Shortness of breath EXAM: PORTABLE CHEST 1 VIEW COMPARISON:  01/21/2019 FINDINGS: No significant interval change in AP portable examination with small right pleural effusion and associated atelectasis or consolidation. The left lung is normally aerated. Cardiomegaly status post median sternotomy. IMPRESSION: 1. No significant interval change in AP portable examination with small right pleural effusion and associated atelectasis or consolidation. The left lung is normally aerated. 2.  Cardiomegaly status post median sternotomy. Electronically Signed  By: Lauralyn Primes M.D.   On: 01/30/2019 16:41   Dg Abd 2 Views  Result Date: 01/31/2019 CLINICAL DATA:  Abdominal pain and distension EXAM: ABDOMEN - 2 VIEW COMPARISON:  None. FINDINGS: Nonobstructive pattern of bowel gas with gas present to the rectum. Scattered stool present throughout the colon. No free air in the abdomen. Cardiomegaly in the included lower chest, better assessed by dedicated chest radiograph. 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 IMPRESSION: Nonobstructive pattern of bowel gas with gas present to the rectum. Scattered stool present throughout the colon. No free air in the abdomen. Electronically Signed   By: Lauralyn Primes M.D.   On: 01/31/2019 17:32   US Abdomen Limited Ruq  Result Date: 02/01/2019 CLINICAL DATA:  Cirrhosis and abdominal distension EXAM: ULTRASOUND ABDOMEN LIMITED RIGHT UPPER QUADRANT COMPARISON:  None. FINDINGS: Gallbladder: No gallstones visualized. The gallbladder wall is diffusely thickened measuring up to 0.7 cm. No sonographic  Murphy sign noted by sonographer. Common bile duct: Diameter: 0.4 cm Liver: No focal lesion identified. The liver contour is mildly nodular. Within normal limits in parenchymal echogenicity. Portal vein is patent on color Doppler imaging with normal direction of blood flow towards the liver. Other: Mild right upper quadrant ascites. Small right pleural effusion. IMPRESSION: 1. Mildly nodular contour of the liver consistent with history of cirrhosis. Mild right upper quadrant ascites and small pleural effusion visualized. 2. Gallbladder wall thickening which is a nonspecific finding and can be seen in the setting of gallbladder inflammation, cirrhosis, fluid overload states, hypoalbuminemia, among others. No gallstones visualized. Electronically Signed   By: Emmaline Kluver M.D.   On: 02/01/2019 09:11      ASSESSMENT/PLAN   Acute respiratory distress with mild hypoxemia   -Likely due to fluid overload from continued alcohol abuse and likely liver cirrhosis, patient had ultrasound-guided paracentesis and reports improvement in breathing additionally he has been diuresed and has improved clinically   -Currently is reporting dizziness and "feeling drunk "after starting new medication which I think may be Remeron I will stop this for now   -Cardiology on case appreciate input    Pulmonary arterial hypertension   -Likely group 2 and 3 secondary to cardiac disease and chronic COPD   -Treating underlying cause to optimize cardiac and pulmonary function   -We will need outpatient pulmonary evaluation   Centrilobular emphysema Continue typical COPD care path   Tobacco abuse    -Smoking cessation counseling    -Transdermal nicotine replacement therapy   Thank you Dr. Amado Coe for allowing me to participate in the care of this patient.   Patient/Family are satisfied with care plan and all questions have been answered.  This document was prepared using Dragon voice recognition software and may  include unintentional dictation errors.     Vida Rigger, M.D.  Division of Pulmonary & Critical Care Medicine  [facility]

## 2019-02-02 NOTE — Progress Notes (Signed)
Patient ID: [name], male   DOB: 1960/07/26, 57 y.o.   MRN: [id number]  [facility] PROGRESS NOTE  [name]:[id number] DOB: 04/29/1960 DOA: 01/30/2019 PCP: Medicine, [place] [facility]  HPI/Subjective: Patient had paracentesis today, breathing is better but claims he is not making urine but had 1720 neg output.  Renal function is getting worse Objective: Vitals:   02/02/19 1438 02/02/19 1441  BP: 95/75 100/64  Pulse: 67 66  Resp:    Temp:    SpO2: 100% 100%    Intake/Output Summary (Last 24 hours) at 02/02/2019 1515 Last data filed at 02/02/2019 0010 Gross per 24 hour  Intake -  Output 0 ml  Net 0 ml   Filed Weights   01/31/19 0514 02/01/19 0353 02/02/19 0444  Weight: 69.9 kg 70.6 kg 71.5 kg    ROS: Review of Systems  Constitutional: Negative for chills and fever.  Eyes: Negative for blurred vision.  Respiratory: Positive for cough and shortness of breath.   Cardiovascular: Negative for chest pain.  Gastrointestinal: Positive for abdominal pain. Negative for constipation, diarrhea, nausea and vomiting.  Genitourinary: Negative for dysuria.  Musculoskeletal: Negative for joint pain.  Neurological: Negative for dizziness and headaches.   Exam: Physical Exam  Constitutional: He is oriented to person, place, and time.  HENT:  Nose: No mucosal edema.  Mouth/Throat: No oropharyngeal exudate or posterior oropharyngeal edema.  Eyes: Pupils are equal, round, and reactive to light. Conjunctivae, EOM and lids are normal.  Neck: No JVD present. Carotid bruit is not present. No edema present. No thyroid mass and no thyromegaly present.  Cardiovascular: S1 normal and S2 normal. Exam reveals no gallop.  Murmur heard.  Systolic murmur is present with a grade of 3/6. Respiratory: No respiratory distress. He has decreased breath sounds in the right lower field and the left lower field. He has no wheezes. He has no rhonchi. He has rales in the right lower field and the  left lower field.  GI: Soft. Bowel sounds are normal. He exhibits distension. There is abdominal tenderness.  Musculoskeletal:     Right ankle: He exhibits swelling.     Left ankle: He exhibits swelling.  Lymphadenopathy:    He has no cervical adenopathy.  Neurological: He is alert and oriented to person, place, and time. No cranial nerve deficit.  Skin: Skin is warm. No rash noted. Nails show no clubbing.  Psychiatric: He has a normal mood and affect.      Data Reviewed: Basic Metabolic Panel: Recent Labs  Lab 01/30/19 1600 01/31/19 0539 02/01/19 0444 02/01/19 2323 02/02/19 0525  NA 136 140 138 136 136  K 5.5* 4.3 4.4 5.9* 5.3*  CL 103 104 104 99 101  CO2 22 25 24 22 24   GLUCOSE 98 145* 95 91 104*  BUN 27* 35* 43* 54* 62*  CREATININE 1.29* 1.48* 2.32* 3.17* 3.51*  CALCIUM 8.7* 8.7* 8.5* 8.8* 8.8*  MG  --   --   --  2.2  --   PHOS  --   --   --  7.4*  --    Liver Function Tests: Recent Labs  Lab 01/30/19 1600 02/01/19 2323 02/02/19 0525  AST 90* 531* 1,318*  ALT 64* 274* 624*  ALKPHOS 121 158* 149*  BILITOT 1.7* 2.3* 2.2*  PROT 6.9 6.5 6.6  ALBUMIN 3.5 3.2* 3.1*   CBC: Recent Labs  Lab 01/30/19 1600 02/01/19 2323 02/02/19 0525  WBC 7.1 12.3* 10.7*  NEUTROABS 4.5  --  7.7  HGB 12.0* 12.9* 13.1  HCT 37.3* 40.5 40.2  MCV 80.4 80.7 78.8*  PLT 216 207 195   BNP (last 3 results) Recent Labs    01/06/19 1922 01/30/19 1600  BNP 1,112.0* 1,234.0*      Recent Results (from the past 240 hour(s))  SARS CORONAVIRUS 2 (TAT 6-24 HRS) Nasopharyngeal Nasopharyngeal Swab     Status: None   Collection Time: 01/30/19  4:00 PM   Specimen: Nasopharyngeal Swab  Result Value Ref Range Status   SARS Coronavirus 2 NEGATIVE NEGATIVE Final    Comment: (NOTE) SARS-CoV-2 target nucleic acids are NOT DETECTED. The SARS-CoV-2 RNA is generally detectable in upper and lower respiratory specimens during the acute phase of infection. Negative results do not preclude  SARS-CoV-2 infection, do not rule out co-infections with other pathogens, and should not be used as the sole basis for treatment or other patient management decisions. Negative results must be combined with clinical observations, patient history, and epidemiological information. The expected result is Negative. Fact Sheet for Patients: HairSlick.nohttps://www.fda.gov/media/138098/download Fact Sheet for Healthcare Providers: quierodirigir.comhttps://www.fda.gov/media/138095/download This test is not yet approved or cleared by the [place] FDA and  has been authorized for detection and/or diagnosis of SARS-CoV-2 by FDA under an Emergency Use Authorization (EUA). This EUA will remain  in effect (meaning this test can be used) for the duration of the COVID-19 declaration under Section 56 4(b)(1) of the Act, 21 U.S.C. section 360bbb-3(b)(1), unless the authorization is terminated or revoked sooner. Performed at [facility], [address] [place], [place] [address]      Studies: [place] Paracentesis  Result Date: 02/02/2019 INDICATION: 58 year old with ascites and abdominal distention. EXAM: ULTRASOUND GUIDED PARACENTESIS MEDICATIONS: None. COMPLICATIONS: None immediate. PROCEDURE: Informed written consent was obtained from the patient after a discussion of the risks, benefits and alternatives to treatment. A timeout was performed prior to the initiation of the procedure. Initial ultrasound scanning demonstrates a small amount of ascites within the right lower abdominal quadrant. The right lower abdomen was prepped and draped in the usual sterile fashion. 1% lidocaine was used for local anesthesia. Following this, a 19 gauge, 7-cm, Yueh catheter was introduced. An ultrasound image was saved for documentation purposes. The paracentesis was performed. The catheter was removed and a dressing was applied. The patient tolerated the procedure well without immediate post procedural complication. FINDINGS: A total of  approximately 650 mL of yellow fluid was removed. Samples were sent to the laboratory as requested by the clinical team. IMPRESSION: Successful ultrasound-guided paracentesis yielding 650 mL of peritoneal fluid. Electronically Signed   By: Richarda OverlieAdam  Henn M.D.   On: 02/02/2019 14:37   Dg Abd 2 Views  Result Date: 01/31/2019 CLINICAL DATA:  Abdominal pain and distension EXAM: ABDOMEN - 2 VIEW COMPARISON:  None. FINDINGS: Nonobstructive pattern of bowel gas with gas present to the rectum. Scattered stool present throughout the colon. No free air in the abdomen. Cardiomegaly in the included lower chest, better assessed by dedicated chest radiograph. 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 IMPRESSION: Nonobstructive pattern of bowel gas with gas present to the rectum. Scattered stool present throughout the colon. No free air in the abdomen. Electronically Signed   By: Lauralyn PrimesAlex  Bibbey M.D.   On: 01/31/2019 17:32   [place] Abdomen Limited Ruq  Result Date: 02/01/2019 CLINICAL DATA:  Cirrhosis and abdominal distension EXAM: ULTRASOUND ABDOMEN LIMITED RIGHT UPPER QUADRANT COMPARISON:  None. FINDINGS: Gallbladder: No gallstones visualized. The gallbladder wall  is diffusely thickened measuring up to 0.7 cm. No sonographic Murphy sign noted by sonographer. Common bile duct: Diameter: 0.4 cm Liver: No focal lesion identified. The liver contour is mildly nodular. Within normal limits in parenchymal echogenicity. Portal vein is patent on color Doppler imaging with normal direction of blood flow towards the liver. Other: Mild right upper quadrant ascites. Small right pleural effusion. IMPRESSION: 1. Mildly nodular contour of the liver consistent with history of cirrhosis. Mild right upper quadrant ascites and small pleural effusion visualized. 2. Gallbladder wall thickening which is a nonspecific finding and can be seen in the setting of gallbladder inflammation, cirrhosis, fluid overload  states, hypoalbuminemia, among others. No gallstones visualized. Electronically Signed   By: Emmaline Kluver M.D.   On: 02/01/2019 09:11    Scheduled Meds: . acetaminophen  1,000 mg Oral Q8H  . aspirin EC  325 mg Oral Daily  . atorvastatin  40 mg Oral Daily  . [START ON 02/03/2019] DULoxetine  30 mg Oral Daily  . FLUoxetine  20 mg Oral BH-q7a  . fluticasone  2 spray Each Nare Daily  . folic acid  1 mg Oral Daily  . furosemide  40 mg Oral Daily  . gabapentin  300 mg Oral TID  . heparin  5,000 Units Subcutaneous Q8H  . ipratropium-albuterol  3 mL Nebulization Q6H  . mouth rinse  15 mL Mouth Rinse BID  . metoprolol tartrate  25 mg Oral Daily  . mirtazapine  15 mg Oral QHS  . multivitamin with minerals  1 tablet Oral Daily  . pneumococcal 23 valent vaccine  0.5 mL Intramuscular Tomorrow-1000  . polyethylene glycol  17 g Oral Daily  . sodium chloride flush  3 mL Intravenous Q12H  . sodium zirconium cyclosilicate  5 g Oral Once  . thiamine  100 mg Oral Daily   Or  . thiamine  100 mg Intravenous Daily   Continuous Infusions: . sodium chloride      Assessment/Plan:  1. Acute diastolic congestive heart failure with moderate mitral regurgitation and severe tricuspid regurgitation.  Patient initially diuresed with IV Lasix 60 mg every 6 hours diuresis Changed to p.o. Lasix as the renal function bumped up from 1.48 of creatinine to 2.32 on 02/01/2019.  Holding Lasix today as creatinine trended up to-3.51 continue metoprolol.  Repeat a.m. labs.  No improvement will consider nephrology consult 2. Acute hypoxic respiratory failure.  Try to wean off oxygen.  Patient does not wear oxygen at home. 3. Pulmonary hypertension seen on echocardiogram.  Pulmonology consult 4. 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  Ultrasound with the cholecystitis with gallbladder wall thickening but no gallstones but patient is reporting abdominal distention .status post  ultrasound-guided paracentesis for symptomatic relief and also follow-up on the labs.  Hepatitis panel ordered.  GI consult placed-Dr. Allegra Lai notified she is aware.  If necessary will involve surgery 5. Hyperlipidemia unspecified on Lipitor 6. Acute kidney injury chronic kidney disease stage II.  Renal function has gotten worse with overdiuresis will discontinue Lasix and other nephrotoxins and get renal ultrasound and if no improvement will consider nephrology consult Hyperkalemia on presentation.  Watch with diuresis 7.  Constipation with scattered stool throughout the colon we will put him on stool softeners and laxatives Code Status:     Code Status Orders  (From admission, onward)         Start     Ordered   01/30/19 2200  Full code  Continuous  01/30/19 2203        Code Status History    Date Active Date Inactive Code Status Order ID Comments User Context   01/07/2019 0231 01/08/2019 1755 Full Code [id number]  Oralia Manis, MD Inpatient   08/07/2011 1543 08/08/2011 1513 Full Code [id number]  Glynn Octave, MD ED   Advance Care Planning Activity     Family Communication: Patient did not want me to speak with family. Disposition Plan: To be determined  Consultants:  Cardiology  Time spent: 35  minutes  Deanna Artis Kaula Klenke  [employer]

## 2019-02-02 NOTE — Plan of Care (Signed)

## 2019-02-03 ENCOUNTER — Inpatient Hospital Stay: Payer: Medicaid Other

## 2019-02-03 DIAGNOSIS — K7200 Acute and subacute hepatic failure without coma: Secondary | ICD-10-CM | POA: Diagnosis not present

## 2019-02-03 DIAGNOSIS — I5023 Acute on chronic systolic (congestive) heart failure: Secondary | ICD-10-CM

## 2019-02-03 LAB — PROTIME-INR
INR: 3.3 — ABNORMAL HIGH (ref 0.8–1.2)
Prothrombin Time: 33.4 seconds — ABNORMAL HIGH (ref 11.4–15.2)

## 2019-02-03 LAB — ACID FAST SMEAR (AFB, MYCOBACTERIA): Acid Fast Smear: NEGATIVE

## 2019-02-03 LAB — COMPREHENSIVE METABOLIC PANEL
ALT: 923 U/L — ABNORMAL HIGH (ref 0–44)
AST: 2174 U/L — ABNORMAL HIGH (ref 15–41)
Albumin: 3.2 g/dL — ABNORMAL LOW (ref 3.5–5.0)
Alkaline Phosphatase: 130 U/L — ABNORMAL HIGH (ref 38–126)
Anion gap: 13 (ref 5–15)
BUN: 82 mg/dL — ABNORMAL HIGH (ref 6–20)
CO2: 21 mmol/L — ABNORMAL LOW (ref 22–32)
Calcium: 8.3 mg/dL — ABNORMAL LOW (ref 8.9–10.3)
Chloride: 101 mmol/L (ref 98–111)
Creatinine, Ser: 4.67 mg/dL — ABNORMAL HIGH (ref 0.61–1.24)
GFR calc Af Amer: 15 mL/min — ABNORMAL LOW (ref >60)
GFR calc non Af Amer: 13 mL/min — ABNORMAL LOW (ref >60)
Glucose, Bld: 135 mg/dL — ABNORMAL HIGH (ref 70–99)
Potassium: 5.5 mmol/L — ABNORMAL HIGH (ref 3.5–5.1)
Sodium: 135 mmol/L (ref 135–145)
Total Bilirubin: 1.9 mg/dL — ABNORMAL HIGH (ref 0.3–1.2)
Total Protein: 6.3 g/dL — ABNORMAL LOW (ref 6.5–8.1)

## 2019-02-03 LAB — TRIGLYCERIDES, BODY FLUIDS: Triglycerides, Fluid: 40 mg/dL

## 2019-02-03 LAB — PH, BODY FLUID: pH, Body Fluid: 7.5

## 2019-02-03 LAB — PROTEIN, BODY FLUID (OTHER): Total Protein, Body Fluid Other: 2.7 g/dL

## 2019-02-03 MED ORDER — SODIUM ZIRCONIUM CYCLOSILICATE 5 G PO PACK
5.0000 g | PACK | Freq: Once | ORAL | Status: AC
  Administered 2019-02-03: 5 g via ORAL
  Filled 2019-02-03: qty 1

## 2019-02-03 MED ORDER — MIDODRINE HCL 5 MG PO TABS
5.0000 mg | ORAL_TABLET | Freq: Three times a day (TID) | ORAL | Status: DC
  Administered 2019-02-03 – 2019-02-07 (×12): 5 mg via ORAL
  Filled 2019-02-03 (×12): qty 1

## 2019-02-03 MED ORDER — DEXTROSE 5 % IV SOLN
15.0000 mg/kg/h | INTRAVENOUS | Status: AC
  Administered 2019-02-03 – 2019-02-05 (×4): 15 mg/kg/h via INTRAVENOUS
  Filled 2019-02-03 (×5): qty 120

## 2019-02-03 MED ORDER — LACTATED RINGERS IV BOLUS
500.0000 mL | Freq: Once | INTRAVENOUS | Status: AC
  Administered 2019-02-03: 500 mL via INTRAVENOUS

## 2019-02-03 MED ORDER — SODIUM CHLORIDE 0.9 % IV SOLN
INTRAVENOUS | Status: AC
  Administered 2019-02-03 (×2): via INTRAVENOUS

## 2019-02-03 MED ORDER — ACETYLCYSTEINE LOAD VIA INFUSION
150.0000 mg/kg | Freq: Once | INTRAVENOUS | Status: AC
  Administered 2019-02-03: 10890 mg via INTRAVENOUS
  Filled 2019-02-03: qty 273

## 2019-02-03 MED ORDER — LACTULOSE 10 GM/15ML PO SOLN
20.0000 g | Freq: Every day | ORAL | Status: DC
  Administered 2019-02-04 – 2019-02-05 (×2): 20 g via ORAL
  Filled 2019-02-03 (×3): qty 30

## 2019-02-03 NOTE — Consult Note (Signed)
[address] [place], [place] [address] Phone [phone]. Fax [phone]  Date: 02/03/2019                  Patient Name:  [name]  MRN: [id number]  DOB: April 15, 1960  Age / Sex: 58 y.o., male         PCP: Medicine, [employer]                 Service Requesting Consult: IM/ Delfino Lovett, MD                 Reason for Consult: AKI            History of Present Illness: Patient is a 58 y.o. male with multiple complex medical problems who was admitted to [facility] on 01/30/2019 for evaluation of swelling in legs and abdomen and SOB.   Patient has bipolar 1 disorder, diabetes, hypertension cardiomyopathy and COPD.  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 Diuresed with furosemide 40 mg IV twice daily but that led to worsening renal function  Patient's baseline creatinine is 1.18 from January 21, 2019.  It has steadily worsened since then and has increased to 4.67 today.  BUN is elevated at 82.  Potassium is elevated at 5.5 AST and ALT are significantly elevated Patient is coagulopathic with INR of 2.3  Imaging: Renal ultrasound October 28 shows normal-sized kidneys without hydronephrosis with mildly increased cortical echogenicity and minimal ascites.  Patient experienced episodes of hypotension on October 26 with blood pressure as low as 88/68.   Medications: Outpatient medications: Medications Prior to Admission  Medication Sig Dispense Refill Last Dose  .  atorvastatin (LIPITOR) 40 MG tablet Take 40 mg by mouth daily.   Past Week at Unknown time  . azithromycin (ZITHROMAX) 250 MG tablet Take as directed on package. 6 each 0 01/30/2019 at Unknown time  . FLUoxetine (PROZAC) 20 MG capsule Take 20 mg by mouth every morning.   Past Week at Unknown time  . furosemide (LASIX) 40 MG tablet Take 1 tablet (40 mg total) by mouth daily. 30 tablet 5 Past Week at Unknown time  . gabapentin (NEURONTIN) 300 MG capsule Take 300 mg by mouth 3 (three) times daily.   Past Week at Unknown time  . ipratropium (ATROVENT HFA) 17 MCG/ACT inhaler Inhale 2 puffs into the lungs 4 (four) times daily.   Past Week at Unknown time  . meloxicam (MOBIC) 15 MG tablet Take 15 mg by mouth daily.   Past Week at Unknown time  . metoprolol tartrate (LOPRESSOR) 25 MG tablet Take 25 mg by mouth daily.   Past Week at Unknown time  . mirtazapine (REMERON) 15 MG tablet Take 15 mg by mouth at bedtime.   Past Week at Unknown time  . potassium chloride 20 MEQ TBCR Take 20 mEq by mouth daily. 30 tablet 5 Past Week at Unknown time  . acetaminophen (TYLENOL) 500 MG tablet Take 1,000 mg by mouth every 8 (eight) hours.   prn at prn  .  albuterol (VENTOLIN HFA) 108 (90 Base) MCG/ACT inhaler Inhale 2 puffs into the lungs every 4 (four) hours as needed for wheezing or shortness of breath. 6.7 g 0 prn at prn  . aspirin 325 MG tablet Take 325 mg by mouth daily.   Not Taking at Unknown time  . DULoxetine (CYMBALTA) 30 MG capsule Take 30 mg by mouth daily.   Not Taking at Unknown time  . naproxen (NAPROSYN) 500 MG tablet Take 500 mg by mouth 2 (two) times daily with a meal.   Completed Course at Unknown time    Current medications: Current Facility-Administered Medications  Medication Dose Route Frequency Provider Last Rate Last Dose  . 0.9 %  sodium chloride infusion  250 mL Intravenous PRN [name], [name], NP      . bisacodyl (DULCOLAX) EC tablet 10 mg  10 mg Oral Daily PRN [name], [name], MD       . feeding supplement (BOOST / RESOURCE BREEZE) liquid 1 Container  1 Container Oral TID BM Toney ReilVanga, Rohini Reddy, MD   1 Container at 02/02/19 2240  . fluticasone (FLONASE) 50 MCG/ACT nasal spray 2 spray  2 spray Each Nare Daily [name], [name], MD   2 spray at 02/02/19 1007  . folic acid (FOLVITE) tablet 1 mg  1 mg Oral Daily [name], [name], NP   1 mg at 02/02/19 0956  . heparin injection 5,000 Units  5,000 Units Subcutaneous Q8H [name], [name], NP   5,000 Units at 02/03/19 0539  . ipratropium-albuterol (DUONEB) 0.5-2.5 (3) MG/3ML nebulizer solution 3 mL  3 mL Nebulization Q6H Alford HighlandWieting, [name], MD   3 mL at 02/03/19 0253  . LORazepam (ATIVAN) tablet 1-4 mg  1-4 mg Oral Q1H PRN [name], [name], NP       Or  . LORazepam (ATIVAN) injection 1-4 mg  1-4 mg Intravenous Q1H PRN [name], [name], NP      . MEDLINE mouth rinse  15 mL Mouth Rinse BID [name], [name], NP   15 mL at 02/02/19 2241  . multivitamin with minerals tablet 1 tablet  1 tablet Oral Daily [name], [name], NP   1 tablet at 02/02/19 [id number]  . ondansetron (ZOFRAN) injection 4 mg  4 mg Intravenous Q6H PRN [name], [name], NP   4 mg at 02/02/19 1444  . phytonadione (VITAMIN K) SQ injection 10 mg  10 mg Subcutaneous Daily [name], [name], MD      . pneumococcal 23 valent vaccine (PNU-IMMUNE) injection 0.5 mL  0.5 mL Intramuscular Tomorrow-1000 [name], [name], NP      . polyethylene glycol (MIRALAX / GLYCOLAX) packet 17 g  17 g Oral Daily [name], [name], MD   17 g at 02/02/19 0959  . sodium chloride (OCEAN) 0.65 % nasal spray 1 spray  1 spray Each Nare PRN [name], [name], MD      . sodium chloride flush (NS) 0.9 % injection 3 mL  3 mL Intravenous Q12H [name], [name], NP   3 mL at 02/02/19 2241  . sodium chloride flush (NS) 0.9 % injection 3 mL  3 mL Intravenous PRN [name], [name], NP   3 mL at 02/01/19 1355  . sodium zirconium cyclosilicate (LOKELMA) packet 5 g   5 g Oral Once Delfino LovettShah, Vipul, MD      . thiamine (VITAMIN B-1) tablet 100 mg  100 mg Oral Daily [name], [name], NP   100 mg at 02/02/19 0955   Or  . thiamine (B-1)  injection 100 mg  100 mg Intravenous Daily [name], Hubbard Hartshorn, NP          Allergies: No Known Allergies    Past Medical History: Past Medical History:  Diagnosis Date  . Alcohol abuse   . Bipolar 1 disorder (HCC)   . CHF (congestive heart failure) (HCC)   . COPD (chronic obstructive pulmonary disease) (HCC)   . Hip pain   . Hypertension   . Pulmonary HTN (HCC)   . Valvular heart disease      Past Surgical History: Past Surgical History:  Procedure Laterality Date  . AORTIC VALVE REPLACEMENT       Family History: History reviewed. No pertinent family history.   Social History: Social History   Socioeconomic History  . Marital status: Single    Spouse name: Not on file  . Number of children: Not on file  . Years of education: Not on file  . Highest education level: Not on file  Occupational History  . Occupation: disabled  Social Needs  . Financial resource strain: Not on file  . Food insecurity    Worry: Not on file    Inability: Not on file  . Transportation needs    Medical: Not on file    Non-medical: Not on file  Tobacco Use  . Smoking status: Current Every Day Smoker    Packs/day: 1.00    Types: Cigarettes  . Smokeless tobacco: Never Used  Substance and Sexual Activity  . Alcohol use: Yes  . Drug use: Yes    Types: Cocaine  . Sexual activity: Not on file  Lifestyle  . Physical activity    Days per week: Not on file    Minutes per session: Not on file  . Stress: Not on file  Relationships  . Social Musician on phone: Not on file    Gets together: Not on file    Attends religious service: Not on file    Active member of club or organization: Not on file    Attends meetings of clubs or organizations: Not on file    Relationship status: Not on file  .  Intimate partner violence    Fear of current or ex partner: Not on file    Emotionally abused: Not on file    Physically abused: Not on file    Forced sexual activity: Not on file  Other Topics Concern  . Not on file  Social History Narrative  . Not on file     Review of Systems: Gen: Reports weakness, denies fevers or chills HEENT: No vision or hearing complaints CV: No chest pain but did have shortness of breath building up over many weeks prior to admission.  He lower extremity edema Resp: Reports cough and congestion.  Denies hemoptysis GI: Appetite has been good.  No blood in the stool GU : No problems with voiding although urine output has decreased over the last few days. MS: Left hip severe arthritis.  Needs hip replacement in future Derm:    No complaints Psych: No complaints Heme: No complaints Neuro: No complaints Endocrine.  No complaints  Vital Signs: Blood pressure 109/61, pulse 71, temperature 97.7 F (36.5 C), temperature source Oral, resp. rate 18, height 5\' 8"  (1.727 m), weight 72.6 kg, SpO2 99 %.   Intake/Output Summary (Last 24 hours) at 02/03/2019 1004 Last data filed at 02/03/2019 0953 Gross per 24 hour  Intake 240 ml  Output 150 ml  Net 90 ml  Weight trends: Filed Weights   02/01/19 0353 02/02/19 0444 02/03/19 0354  Weight: 70.6 kg 71.5 kg 72.6 kg    Physical Exam: General:  No acute distress, laying in the bed  HEENT  anicteric, moist oral mucous membranes  Lungs:  Normal breathing effort, clear to auscultation  Heart::  Regular rhythm, prominent systolic murmur  Abdomen:  Soft, nontender  Extremities:  No peripheral edema  Neurologic:  Alert, oriented  Skin:  Multiple tattoos, no acute rashes    Lab results: Basic Metabolic Panel: Recent Labs  Lab 02/01/19 2323 02/02/19 0525 02/03/19 0527  NA 136 136 135  K 5.9* 5.3* 5.5*  CL 99 101 101  CO2 22 24 21*  GLUCOSE 91 104* 135*  BUN 54* 62* 82*  CREATININE 3.17* 3.51* 4.67*   CALCIUM 8.8* 8.8* 8.3*  MG 2.2  --   --   PHOS 7.4*  --   --     Liver Function Tests: Recent Labs  Lab 02/03/19 0527  AST 2,174*  ALT 923*  ALKPHOS 130*  BILITOT 1.9*  PROT 6.3*  ALBUMIN 3.2*   Recent Labs  Lab 02/02/19 0525  LIPASE 32   No results for input(s): AMMONIA in the last 168 hours.  CBC: Recent Labs  Lab 01/30/19 1600 02/01/19 2323 02/02/19 0525  WBC 7.1 12.3* 10.7*  NEUTROABS 4.5  --  7.7  HGB 12.0* 12.9* 13.1  HCT 37.3* 40.5 40.2  MCV 80.4 80.7 78.8*  PLT 216 207 195    Cardiac Enzymes: No results for input(s): CKTOTAL, TROPONINI in the last 168 hours.  BNP: Invalid input(s): POCBNP  CBG: No results for input(s): GLUCAP in the last 168 hours.  Microbiology: Recent Results (from the past 720 hour(s))  SARS Coronavirus 2 [facility] order, Performed in [facility] hospital lab) Nasopharyngeal Nasopharyngeal Swab     Status: None   Collection Time: 01/06/19 10:18 PM   Specimen: Nasopharyngeal Swab  Result Value Ref Range Status   SARS Coronavirus 2 NEGATIVE NEGATIVE Final    Comment: (NOTE) If result is NEGATIVE SARS-CoV-2 target nucleic acids are NOT DETECTED. The SARS-CoV-2 RNA is generally detectable in upper and lower  respiratory specimens during the acute phase of infection. The lowest  concentration of SARS-CoV-2 viral copies this assay can detect is 250  copies / mL. A negative result does not preclude SARS-CoV-2 infection  and should not be used as the sole basis for treatment or other  patient management decisions.  A negative result may occur with  improper specimen collection / handling, submission of specimen other  than nasopharyngeal swab, presence of viral mutation(s) within the  areas targeted by this assay, and inadequate number of viral copies  (<250 copies / mL). A negative result must be combined with clinical  observations, patient history, and epidemiological information. If result is POSITIVE SARS-CoV-2 target  nucleic acids are DETECTED. The SARS-CoV-2 RNA is generally detectable in upper and lower  respiratory specimens dur ing the acute phase of infection.  Positive  results are indicative of active infection with SARS-CoV-2.  Clinical  correlation with patient history and other diagnostic information is  necessary to determine patient infection status.  Positive results do  not rule out bacterial infection or co-infection with other viruses. If result is PRESUMPTIVE POSTIVE SARS-CoV-2 nucleic acids MAY BE PRESENT.   A presumptive positive result was obtained on the submitted specimen  and confirmed on repeat testing.  While 2019 novel coronavirus  (SARS-CoV-2) nucleic acids may be  present in the submitted sample  additional confirmatory testing may be necessary for epidemiological  and / or clinical management purposes  to differentiate between  SARS-CoV-2 and other Sarbecovirus currently known to infect humans.  If clinically indicated additional testing with an alternate test  methodology [phone]) is advised. The SARS-CoV-2 RNA is generally  detectable in upper and lower respiratory sp ecimens during the acute  phase of infection. The expected result is Negative. Fact Sheet for Patients:  BoilerBrush.com.cy Fact Sheet for Healthcare Providers: https://pope.com/ This test is not yet approved or cleared by the [place] FDA and has been authorized for detection and/or diagnosis of SARS-CoV-2 by FDA under an Emergency Use Authorization (EUA).  This EUA will remain in effect (meaning this test can be used) for the duration of the COVID-19 declaration under Section 564(b)(1) of the Act, 21 U.S.C. section 360bbb-3(b)(1), unless the authorization is terminated or revoked sooner. Performed at [facility], [address] [place], [place] [address]   SARS CORONAVIRUS 2 (TAT 6-24 HRS) Nasopharyngeal Nasopharyngeal Swab     Status:  None   Collection Time: 01/30/19  4:00 PM   Specimen: Nasopharyngeal Swab  Result Value Ref Range Status   SARS Coronavirus 2 NEGATIVE NEGATIVE Final    Comment: (NOTE) SARS-CoV-2 target nucleic acids are NOT DETECTED. The SARS-CoV-2 RNA is generally detectable in upper and lower respiratory specimens during the acute phase of infection. Negative results do not preclude SARS-CoV-2 infection, do not rule out co-infections with other pathogens, and should not be used as the sole basis for treatment or other patient management decisions. Negative results must be combined with clinical observations, patient history, and epidemiological information. The expected result is Negative. Fact Sheet for Patients: HairSlick.no Fact Sheet for Healthcare Providers: quierodirigir.com This test is not yet approved or cleared by the [place] FDA and  has been authorized for detection and/or diagnosis of SARS-CoV-2 by FDA under an Emergency Use Authorization (EUA). This EUA will remain  in effect (meaning this test can be used) for the duration of the COVID-19 declaration under Section 56 4(b)(1) of the Act, 21 U.S.C. section 360bbb-3(b)(1), unless the authorization is terminated or revoked sooner. Performed at [facility], [address] [place], [place] [address]   Body fluid culture     Status: None (Preliminary result)   Collection Time: 02/02/19 11:45 AM   Specimen: PATH Cytology Peritoneal fluid  Result Value Ref Range Status   Specimen Description   Final    PERITONEAL Performed at [facility], [address] [place], [place] [address]    Special Requests   Final    NONE Performed at [facility], [address] [place], [place] [address]    Gram Stain   Final    RARE WBC PRESENT,BOTH PMN AND MONONUCLEAR NO ORGANISMS SEEN    Culture   Final    NO GROWTH < 24 HOURS Performed at [facility], [address] [place], [place] [address]    Report Status PENDING  Incomplete     Coagulation Studies: Recent Labs    02/02/19 0525  LABPROT 24.9*  INR 2.3*    Urinalysis: No results for input(s): COLORURINE, LABSPEC, PHURINE, GLUCOSEU, HGBUR, BILIRUBINUR, KETONESUR, PROTEINUR, UROBILINOGEN, NITRITE, LEUKOCYTESUR in the last 72 hours.  Invalid input(s): APPERANCEUR      Imaging: US Renal  Result Date: 02/03/2019 CLINICAL DATA:  Acute renal insufficiency. EXAM: RENAL / URINARY TRACT ULTRASOUND COMPLETE COMPARISON:  None.  FINDINGS: Right Kidney: Renal measurements: 11.6 x 5.1 x 5.7 cm = volume: 176 mL. Mild increased cortical echogenicity. No mass or hydronephrosis visualized. Left Kidney: Renal measurements: 12.2 x 5.3 x 5.7 cm = volume: 193 mL. Mild increased cortical echogenicity. No mass or hydronephrosis visualized. Bladder: Appears normal for degree of bladder distention, however not well distended. Other: Small amount of ascites.  Small amount of pleural fluid. IMPRESSION: 1. Normal size kidneys without hydronephrosis. Mild increased cortical echogenicity which can be seen in medical renal disease. 2.  Minimal ascites.  Small amount of pleural fluid. Electronically Signed   By: Elberta Fortis M.D.   On: 02/03/2019 08:13   US Paracentesis  Result Date: 02/02/2019 INDICATION: 58 year old with ascites and abdominal distention. EXAM: ULTRASOUND GUIDED PARACENTESIS MEDICATIONS: None. COMPLICATIONS: None immediate. PROCEDURE: Informed written consent was obtained from the patient after a discussion of the risks, benefits and alternatives to treatment. A timeout was performed prior to the initiation of the procedure. Initial ultrasound scanning demonstrates a small amount of ascites within the right lower abdominal quadrant. The right lower abdomen was prepped and draped in the usual sterile fashion. 1% lidocaine was used for local anesthesia. Following this, a 19 gauge, 7-cm,  Yueh catheter was introduced. An ultrasound image was saved for documentation purposes. The paracentesis was performed. The catheter was removed and a dressing was applied. The patient tolerated the procedure well without immediate post procedural complication. FINDINGS: A total of approximately 650 mL of yellow fluid was removed. Samples were sent to the laboratory as requested by the clinical team. IMPRESSION: Successful ultrasound-guided paracentesis yielding 650 mL of peritoneal fluid. Electronically Signed   By: Richarda Overlie M.D.   On: 02/02/2019 14:37      Assessment & Plan: Pt is a 58 y.o. African-American male with history of bipolar disorder, diabetes, hypertension, COPD, aortic valve replacement on 1990 and aortic and mitral valve repair in 2000 at [facility], history of hepatitis C treated with Harvoni at [facility] 4 years ago,   was admitted on 01/30/2019 with worsening edema and ascites.   Active problems: Cirrhosis, pulmonary hypertension,moderate mitral regurgitation, calcification of mitral valve, dilated right atrium and left atrium,With EF of 50 to 55% and dilated cardiomyopathy Moderately reduced LV function, diastolic dysfunction, acute hepatitis   #Acute kidney injury -Differential diagnosis includes cardiorenal syndrome, prerenal and ATN leading to AKI from aggressive diuresis -Patient clinically appears to be volume depleted.  We will give gentle IV hydration and assess daily. -Avoid hypotension.  Start midodrine 5 mg 3 times daily -Avoid nephrotoxins, NSAIDs, IV contrast -Discussed possibility of needing dialysis if renal function does not recover.  No acute indication at present.  Conservative treatment for now  #Hyperkalemia -Low-dose daily Lokelma -Follow low-salt diet  #GI - Cirrhosis, alcoholic, Acute hepatitis , coagulopathy -Reports that he was told not to drink alcohol after hepatitis C treatment but he started drinking beer anyway.  Currently he reports drinking 240 ounce  beers daily and liquor occasionally. -Advised patient to quit alcohol -management as per GI team     LOS: 4 Desmund Elman 10/28/202010:04 AM    Note: This note was prepared with Dragon dictation. Any transcription errors are unintentional

## 2019-02-03 NOTE — Progress Notes (Signed)
Mike Darby, MD [address]  [place]  [place], [place] [address]  Main: [phone]  Fax: [phone] Pager: [phone]   Subjective: Patient reports decreased appetite.  He denies abdominal pain, nausea or vomiting.  Making little urine.   Objective: Vital signs in last 24 hours: Vitals:   02/03/19 0354 02/03/19 0922 02/03/19 1339 02/03/19 1540  BP: 102/77 109/61  102/70  Pulse: 72 71  79  Resp: 16 18  19   Temp: (!) 97.5 F (36.4 C) 97.7 F (36.5 C)  97.6 F (36.4 C)  TempSrc: Oral Oral  Oral  SpO2: 95% 99% 99% 98%  Weight: 72.6 kg     Height:       Weight change: 1.134 kg  Intake/Output Summary (Last 24 hours) at 02/03/2019 1820 Last data filed at 02/03/2019 0953 Gross per 24 hour  Intake 240 ml  Output 100 ml  Net 140 ml     Exam: Heart:: Regular rate and rhythm, S1S2 present or without murmur or extra heart sounds Lungs: normal and clear to auscultation Abdomen: soft, nontender, normal bowel sounds   Lab Results: CBC Latest Ref Rng & Units 02/02/2019 02/01/2019 01/30/2019  WBC 4.0 - 10.5 K/uL 10.7(H) 12.3(H) 7.1  Hemoglobin 13.0 - 17.0 g/dL 13.1 12.9(L) 12.0(L)  Hematocrit 39.0 - 52.0 % 40.2 40.5 37.3(L)  Platelets 150 - 400 K/uL 195 207 216   CMP Latest Ref Rng & Units 02/03/2019 02/02/2019 02/01/2019  Glucose 70 - 99 mg/dL 135(H) 104(H) 91  BUN 6 - 20 mg/dL 82(H) 62(H) 54(H)  Creatinine 0.61 - 1.24 mg/dL 4.67(H) 3.51(H) 3.17(H)  Sodium 135 - 145 mmol/L 135 136 136  Potassium 3.5 - 5.1 mmol/L 5.5(H) 5.3(H) 5.9(H)  Chloride 98 - 111 mmol/L 101 101 99  CO2 22 - 32 mmol/L 21(L) 24 22  Calcium 8.9 - 10.3 mg/dL 8.3(L) 8.8(L) 8.8(L)  Total Protein 6.5 - 8.1 g/dL 6.3(L) 6.6 6.5  Total Bilirubin 0.3 - 1.2 mg/dL 1.9(H) 2.2(H) 2.3(H)  Alkaline Phos 38 - 126 U/L 130(H) 149(H) 158(H)  AST 15 - 41 U/L 2,174(H) 1,318(H) 531(H)  ALT 0 - 44 U/L 923(H) 624(H) 274(H)    Micro Results: Recent Results (from the past 240 hour(s))  SARS  CORONAVIRUS 2 (TAT 6-24 HRS) Nasopharyngeal Nasopharyngeal Swab     Status: None   Collection Time: 01/30/19  4:00 PM   Specimen: Nasopharyngeal Swab  Result Value Ref Range Status   SARS Coronavirus 2 NEGATIVE NEGATIVE Final    Comment: (NOTE) SARS-CoV-2 target nucleic acids are NOT DETECTED. The SARS-CoV-2 RNA is generally detectable in upper and lower respiratory specimens during the acute phase of infection. Negative results do not preclude SARS-CoV-2 infection, do not rule out co-infections with other pathogens, and should not be used as the sole basis for treatment or other patient management decisions. Negative results must be combined with clinical observations, patient history, and epidemiological information. The expected result is Negative. Fact Sheet for Patients: SugarRoll.be Fact Sheet for Healthcare Providers: https://www.woods-mathews.com/ This test is not yet approved or cleared by the [place] FDA and  has been authorized for detection and/or diagnosis of SARS-CoV-2 by FDA under an Emergency Use Authorization (EUA). This EUA will remain  in effect (meaning this test can be used) for the duration of the COVID-19 declaration under Section 56 4(b)(1) of the Act, 21 U.S.C. section 360bbb-3(b)(1), unless the authorization is terminated or revoked sooner. Performed at [facility], [place] [address], [place], [place]  [id number]   Body fluid culture     Status: None (Preliminary result)   Collection Time: 02/02/19 11:45 AM   Specimen: PATH Cytology Peritoneal fluid  Result Value Ref Range Status   Specimen Description   Final    PERITONEAL Performed at [facility], [address] [place], [place] [address]    Special Requests   Final    NONE Performed at [facility], [address] [place], [place] [address]    Gram Stain   Final    RARE WBC PRESENT,BOTH PMN AND MONONUCLEAR NO ORGANISMS SEEN     Culture   Final    NO GROWTH < 24 HOURS Performed at [facility], [address] [place], [place] [address]    Report Status PENDING  Incomplete  Acid Fast Smear (AFB)     Status: None   Collection Time: 02/02/19 11:45 AM   Specimen: PATH Cytology Peritoneal fluid  Result Value Ref Range Status   AFB Specimen Processing Concentration  Final   Acid Fast Smear Negative  Final    Comment: (NOTE) Performed At: [facility] [address] [place], [place] [id number] Jolene SchimkeNagendra Sanjai MD MV:[id number]:[phone]    Source (AFB) PERITONEAL  Final    Comment: Performed at [facility], [address] [place], [place] [address]   Studies/Results: [place] Renal  Result Date: 02/03/2019 CLINICAL DATA:  Acute renal insufficiency. EXAM: RENAL / URINARY TRACT ULTRASOUND COMPLETE COMPARISON:  None. FINDINGS: Right Kidney: Renal measurements: 11.6 x 5.1 x 5.7 cm = volume: 176 mL. Mild increased cortical echogenicity. No mass or hydronephrosis visualized. Left Kidney: Renal measurements: 12.2 x 5.3 x 5.7 cm = volume: 193 mL. Mild increased cortical echogenicity. No mass or hydronephrosis visualized. Bladder: Appears normal for degree of bladder distention, however not well distended. Other: Small amount of ascites.  Small amount of pleural fluid. IMPRESSION: 1. Normal size kidneys without hydronephrosis. Mild increased cortical echogenicity which can be seen in medical renal disease. 2.  Minimal ascites.  Small amount of pleural fluid. Electronically Signed   By: Elberta Fortisaniel  Boyle M.D.   On: 02/03/2019 08:13   [place] Paracentesis  Result Date: 02/02/2019 INDICATION: 58 year old with ascites and abdominal distention. EXAM: ULTRASOUND GUIDED PARACENTESIS MEDICATIONS: None. COMPLICATIONS: None immediate. PROCEDURE: Informed written consent was obtained from the patient after a discussion of the risks, benefits and alternatives to treatment. A timeout was performed prior to the initiation of the  procedure. Initial ultrasound scanning demonstrates a small amount of ascites within the right lower abdominal quadrant. The right lower abdomen was prepped and draped in the usual sterile fashion. 1% lidocaine was used for local anesthesia. Following this, a 19 gauge, 7-cm, Yueh catheter was introduced. An ultrasound image was saved for documentation purposes. The paracentesis was performed. The catheter was removed and a dressing was applied. The patient tolerated the procedure well without immediate post procedural complication. FINDINGS: A total of approximately 650 mL of yellow fluid was removed. Samples were sent to the laboratory as requested by the clinical team. IMPRESSION: Successful ultrasound-guided paracentesis yielding 650 mL of peritoneal fluid. Electronically Signed   By: Richarda OverlieAdam  Henn M.D.   On: 02/02/2019 14:37   Medications:  I have reviewed the patient's current medications. Prior to Admission:  Medications Prior to Admission  Medication Sig Dispense Refill Last Dose  . atorvastatin (LIPITOR) 40 MG tablet Take 40 mg by mouth daily.   Past Week at Unknown time  . azithromycin (ZITHROMAX) 250 MG  tablet Take as directed on package. 6 each 0 01/30/2019 at Unknown time  . FLUoxetine (PROZAC) 20 MG capsule Take 20 mg by mouth every morning.   Past Week at Unknown time  . furosemide (LASIX) 40 MG tablet Take 1 tablet (40 mg total) by mouth daily. 30 tablet 5 Past Week at Unknown time  . gabapentin (NEURONTIN) 300 MG capsule Take 300 mg by mouth 3 (three) times daily.   Past Week at Unknown time  . ipratropium (ATROVENT HFA) 17 MCG/ACT inhaler Inhale 2 puffs into the lungs 4 (four) times daily.   Past Week at Unknown time  . meloxicam (MOBIC) 15 MG tablet Take 15 mg by mouth daily.   Past Week at Unknown time  . metoprolol tartrate (LOPRESSOR) 25 MG tablet Take 25 mg by mouth daily.   Past Week at Unknown time  . mirtazapine (REMERON) 15 MG tablet Take 15 mg by mouth at bedtime.   Past Week  at Unknown time  . potassium chloride 20 MEQ TBCR Take 20 mEq by mouth daily. 30 tablet 5 Past Week at Unknown time  . acetaminophen (TYLENOL) 500 MG tablet Take 1,000 mg by mouth every 8 (eight) hours.   prn at prn  . albuterol (VENTOLIN HFA) 108 (90 Base) MCG/ACT inhaler Inhale 2 puffs into the lungs every 4 (four) hours as needed for wheezing or shortness of breath. 6.7 g 0 prn at prn  . aspirin 325 MG tablet Take 325 mg by mouth daily.   Not Taking at Unknown time  . DULoxetine (CYMBALTA) 30 MG capsule Take 30 mg by mouth daily.   Not Taking at Unknown time  . naproxen (NAPROSYN) 500 MG tablet Take 500 mg by mouth 2 (two) times daily with a meal.   Completed Course at Unknown time   Scheduled: . feeding supplement  1 Container Oral TID BM  . fluticasone  2 spray Each Nare Daily  . folic acid  1 mg Oral Daily  . ipratropium-albuterol  3 mL Nebulization Q6H  . lactulose  20 g Oral Daily  . mouth rinse  15 mL Mouth Rinse BID  . midodrine  5 mg Oral TID WC  . multivitamin with minerals  1 tablet Oral Daily  . phytonadione  10 mg Subcutaneous Daily  . pneumococcal 23 valent vaccine  0.5 mL Intramuscular Tomorrow-1000  . polyethylene glycol  17 g Oral Daily  . sodium chloride flush  3 mL Intravenous Q12H  . thiamine  100 mg Oral Daily   Or  . thiamine  100 mg Intravenous Daily   Continuous: . sodium chloride    . sodium chloride 100 mL/hr at 02/03/19 1259  . acetylcysteine 15 mg/kg/hr (02/03/19 1528)  . lactated ringers     JZP:HXTAVW chloride, bisacodyl, LORazepam **OR** LORazepam, ondansetron (ZOFRAN) IV, sodium chloride, sodium chloride flush Anti-infectives (From admission, onward)   Start     Dose/Rate Route Frequency Ordered Stop   01/31/19 0030  azithromycin (ZITHROMAX) tablet 500 mg  Status:  Discontinued     500 mg Oral Daily 01/31/19 0017 01/31/19 1506     Scheduled Meds: . feeding supplement  1 Container Oral TID BM  . fluticasone  2 spray Each Nare Daily  . folic  acid  1 mg Oral Daily  . ipratropium-albuterol  3 mL Nebulization Q6H  . lactulose  20 g Oral Daily  . mouth rinse  15 mL Mouth Rinse BID  . midodrine  5 mg Oral TID WC  .  multivitamin with minerals  1 tablet Oral Daily  . phytonadione  10 mg Subcutaneous Daily  . pneumococcal 23 valent vaccine  0.5 mL Intramuscular Tomorrow-1000  . polyethylene glycol  17 g Oral Daily  . sodium chloride flush  3 mL Intravenous Q12H  . thiamine  100 mg Oral Daily   Or  . thiamine  100 mg Intravenous Daily   Continuous Infusions: . sodium chloride    . sodium chloride 100 mL/hr at 02/03/19 1259  . acetylcysteine 15 mg/kg/hr (02/03/19 1528)  . lactated ringers     PRN Meds:.sodium chloride, bisacodyl, LORazepam **OR** LORazepam, ondansetron (ZOFRAN) IV, sodium chloride, sodium chloride flush   Assessment: Active Problems:   CHF (congestive heart failure) (HCC)  Acute liver failure Acute kidney injury  Plan: Acute liver failure, history of heavy alcohol use Likely combination of alcohol injury as well as DILI Ultrasound Doppler did not reveal portal vein thrombosis Do not recommend to start prednisone due to acute renal failure Acute alcoholic hepatitis generally manifests with significant hyperbilirubinemia along with elevated transaminases less than 500s rather than transaminases in 1000s N-acetylcysteine has been started today Check hepatitis B E antigen, HSV, CMV, EBV IgM titers, in process HCV antibody positive, check HCV PCR Adequate p.o. nutrition, high-protein diet and IV hydration Continue vitamin K subcutaneous 10 mg daily for 3 days, day 2 Check LFTs, PT/INR daily Obtain urine drug screen, blood cultures, urine analysis Start lactulose 20 g daily to prevent hepatic encephalopathy Monitor for any neurologic symptoms closely  Ascites No evidence of SBP AFP smear negative, culture in process Cytology pending  Acute renal failure Worsening creatinine, oliguria Currently on IV  fluids, midodrine is initiated by nephrology   LOS: 4 days   [name] 02/03/2019, 6:20 PM

## 2019-02-03 NOTE — Progress Notes (Signed)
Patient ID: [name], male   DOB: Oct 23, 1960, 59 y.o.   MRN: [id number]  [place] PROGRESS NOTE  [name] [id number] DOB: 04-13-1960 DOA: 01/30/2019 PCP: Medicine, [place]  HPI/Subjective: Worsening renal and hepatic function. patient wants to eat Objective: Vitals:   02/03/19 1339 02/03/19 1540  BP:  102/70  Pulse:  79  Resp:  19  Temp:  97.6 F (36.4 C)  SpO2: 99% 98%    Intake/Output Summary (Last 24 hours) at 02/03/2019 1750 Last data filed at 02/03/2019 0953 Gross per 24 hour  Intake 240 ml  Output 100 ml  Net 140 ml   Filed Weights   02/01/19 0353 02/02/19 0444 02/03/19 0354  Weight: 70.6 kg 71.5 kg 72.6 kg    ROS: Review of Systems  Constitutional: Negative for chills and fever.  Eyes: Negative for blurred vision.  Respiratory: Positive for cough and shortness of breath.   Cardiovascular: Negative for chest pain.  Gastrointestinal: Positive for abdominal pain. Negative for constipation, diarrhea, nausea and vomiting.  Genitourinary: Negative for dysuria.  Musculoskeletal: Negative for joint pain.  Neurological: Negative for dizziness and headaches.   Exam: Physical Exam  Constitutional: He is oriented to person, place, and time.  HENT:  Nose: No mucosal edema.  Mouth/Throat: No oropharyngeal exudate or posterior oropharyngeal edema.  Eyes: Pupils are equal, round, and reactive to light. Conjunctivae, EOM and lids are normal.  Neck: No JVD present. Carotid bruit is not present. No edema present. No thyroid mass and no thyromegaly present.  Cardiovascular: S1 normal and S2 normal. Exam reveals no gallop.  Murmur heard.  Systolic murmur is present with a grade of 3/6. Respiratory: No respiratory distress. He has decreased breath sounds in the right lower field and the left lower field. He has no wheezes. He has no rhonchi. He has rales in the right lower field and the left lower field.  GI: Soft. Bowel sounds are normal. He  exhibits distension. There is abdominal tenderness.  Musculoskeletal:     Right ankle: He exhibits swelling.     Left ankle: He exhibits swelling.  Lymphadenopathy:    He has no cervical adenopathy.  Neurological: He is alert and oriented to person, place, and time. No cranial nerve deficit.  Skin: Skin is warm. No rash noted. Nails show no clubbing.  Psychiatric: He has a normal mood and affect.      Data Reviewed: Basic Metabolic Panel: Recent Labs  Lab 01/31/19 0539 02/01/19 0444 02/01/19 2323 02/02/19 0525 02/03/19 0527  NA 140 138 136 136 135  K 4.3 4.4 5.9* 5.3* 5.5*  CL 104 104 99 101 101  CO2 25 24 22 24  21*  GLUCOSE 145* 95 91 104* 135*  BUN 35* 43* 54* 62* 82*  CREATININE 1.48* 2.32* 3.17* 3.51* 4.67*  CALCIUM 8.7* 8.5* 8.8* 8.8* 8.3*  MG  --   --  2.2  --   --   PHOS  --   --  7.4*  --   --    Liver Function Tests: Recent Labs  Lab 01/30/19 1600 02/01/19 2323 02/02/19 0525 02/03/19 0527  AST 90* 531* 1,318* 2,174*  ALT 64* 274* 624* 923*  ALKPHOS 121 158* 149* 130*  BILITOT 1.7* 2.3* 2.2* 1.9*  PROT 6.9 6.5 6.6 6.3*  ALBUMIN 3.5 3.2* 3.1* 3.2*   CBC: Recent Labs  Lab 01/30/19 1600 02/01/19 2323 02/02/19 0525  WBC 7.1 12.3* 10.7*  NEUTROABS 4.5  --  7.7  HGB  12.0* 12.9* 13.1  HCT 37.3* 40.5 40.2  MCV 80.4 80.7 78.8*  PLT 216 207 195   BNP (last 3 results) Recent Labs    01/06/19 1922 01/30/19 1600  BNP 1,112.0* 1,234.0*      Recent Results (from the past 240 hour(s))  SARS CORONAVIRUS 2 (TAT 6-24 HRS) Nasopharyngeal Nasopharyngeal Swab     Status: None   Collection Time: 01/30/19  4:00 PM   Specimen: Nasopharyngeal Swab  Result Value Ref Range Status   SARS Coronavirus 2 NEGATIVE NEGATIVE Final    Comment: (NOTE) SARS-CoV-2 target nucleic acids are NOT DETECTED. The SARS-CoV-2 RNA is generally detectable in upper and lower respiratory specimens during the acute phase of infection. Negative results do not preclude SARS-CoV-2  infection, do not rule out co-infections with other pathogens, and should not be used as the sole basis for treatment or other patient management decisions. Negative results must be combined with clinical observations, patient history, and epidemiological information. The expected result is Negative. Fact Sheet for Patients: HairSlick.no Fact Sheet for Healthcare Providers: quierodirigir.com This test is not yet approved or cleared by the [place] FDA and  has been authorized for detection and/or diagnosis of SARS-CoV-2 by FDA under an Emergency Use Authorization (EUA). This EUA will remain  in effect (meaning this test can be used) for the duration of the COVID-19 declaration under Section 56 4(b)(1) of the Act, 21 U.S.C. section 360bbb-3(b)(1), unless the authorization is terminated or revoked sooner. Performed at [facility], [address] [place], [place] [address]   Body fluid culture     Status: None (Preliminary result)   Collection Time: 02/02/19 11:45 AM   Specimen: PATH Cytology Peritoneal fluid  Result Value Ref Range Status   Specimen Description   Final    PERITONEAL Performed at [facility], [address] [place], [place] [address]    Special Requests   Final    NONE Performed at [facility], [address] [place], [place] [address]    Gram Stain   Final    RARE WBC PRESENT,BOTH PMN AND MONONUCLEAR NO ORGANISMS SEEN    Culture   Final    NO GROWTH < 24 HOURS Performed at [facility], [address], [place], [place] [address]    Report Status PENDING  Incomplete  Acid Fast Smear (AFB)     Status: None   Collection Time: 02/02/19 11:45 AM   Specimen: PATH Cytology Peritoneal fluid  Result Value Ref Range Status   AFB Specimen Processing Concentration  Final   Acid Fast Smear Negative  Final    Comment: (NOTE) Performed At: [facility] [address] [place], [place] [id number] Jolene Schimke MD MV:[id number]    Source (AFB) PERITONEAL  Final    Comment: Performed at [facility], [address] [place] [place], [place] [address]     Studies: US Renal  Result Date: 02/03/2019 CLINICAL DATA:  Acute renal insufficiency. EXAM: RENAL / URINARY TRACT ULTRASOUND COMPLETE COMPARISON:  None. FINDINGS: Right Kidney: Renal measurements: 11.6 x 5.1 x 5.7 cm = volume: 176 mL. Mild increased cortical echogenicity. No mass or hydronephrosis visualized. Left Kidney: Renal measurements: 12.2 x 5.3 x 5.7 cm = volume: 193 mL. Mild increased cortical echogenicity. No mass or hydronephrosis visualized. Bladder: Appears normal for degree of bladder distention, however not well distended. Other: Small amount of ascites.  Small amount of pleural fluid. IMPRESSION: 1. Normal size kidneys without hydronephrosis. Mild  increased cortical echogenicity which can be seen in medical renal disease. 2.  Minimal ascites.  Small amount of pleural fluid. Electronically Signed   By: Elberta Fortis M.D.   On: 02/03/2019 08:13   US Paracentesis  Result Date: 02/02/2019 INDICATION: 58 year old with ascites and abdominal distention. EXAM: ULTRASOUND GUIDED PARACENTESIS MEDICATIONS: None. COMPLICATIONS: None immediate. PROCEDURE: Informed written consent was obtained from the patient after a discussion of the risks, benefits and alternatives to treatment. A timeout was performed prior to the initiation of the procedure. Initial ultrasound scanning demonstrates a small amount of ascites within the right lower abdominal quadrant. The right lower abdomen was prepped and draped in the usual sterile fashion. 1% lidocaine was used for local anesthesia. Following this, a 19 gauge, 7-cm, Yueh catheter was introduced. An ultrasound image was saved for documentation purposes. The paracentesis was performed. The catheter was removed and a dressing was applied. The patient tolerated the  procedure well without immediate post procedural complication. FINDINGS: A total of approximately 650 mL of yellow fluid was removed. Samples were sent to the laboratory as requested by the clinical team. IMPRESSION: Successful ultrasound-guided paracentesis yielding 650 mL of peritoneal fluid. Electronically Signed   By: Richarda Overlie M.D.   On: 02/02/2019 14:37    Scheduled Meds: . feeding supplement  1 Container Oral TID BM  . fluticasone  2 spray Each Nare Daily  . folic acid  1 mg Oral Daily  . ipratropium-albuterol  3 mL Nebulization Q6H  . mouth rinse  15 mL Mouth Rinse BID  . midodrine  5 mg Oral TID WC  . multivitamin with minerals  1 tablet Oral Daily  . phytonadione  10 mg Subcutaneous Daily  . pneumococcal 23 valent vaccine  0.5 mL Intramuscular Tomorrow-1000  . polyethylene glycol  17 g Oral Daily  . sodium chloride flush  3 mL Intravenous Q12H  . thiamine  100 mg Oral Daily   Or  . thiamine  100 mg Intravenous Daily   Continuous Infusions: . sodium chloride    . sodium chloride 100 mL/hr at 02/03/19 1259  . acetylcysteine 15 mg/kg/hr (02/03/19 1528)    Assessment/Plan:  1. Acute diastolic congestive heart failure with moderate mitral regurgitation and severe tricuspid regurgitation. Stopping Diuretics with worsening rena function. Starting IVF per nephro 2. Acute hypoxic respiratory failure.  Try to wean off oxygen.  Patient does not wear oxygen at home. 3. Pulmonary hypertension seen on echocardiogram.  Pulmonology input appreciated 4. 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  Ultrasound with the cholecystitis with gallbladder wall thickening but no gallstones but patient is reporting abdominal distention .s/p ultrasound-guided paracentesis for symptomatic relief - no SBP 5. Hyperlipidemia unspecified on Lipitor 6. Acute on CKD 2: worsening renal function with decrease UOP & Hyperkalemia - could be hepatorenal syndrome. Will c/s Nephro 7.  Acute hepatic  failure - Elevated LFTs, predominantly transaminases Right upper quadrant ultrasound does not reveal any biliary obstruction Likely underlying chronic alcoholic liver disease  Agree with viral hepatitis panel - start IV hydration and adequate oral nutrition Continue multivitamin plus thiamine and folate daily Very poor prognosis 8. Constipation with scattered stool throughout the colon we will put him on stool softeners and laxatives Code Status:     Code Status Orders  (From admission, onward)         Start     Ordered   01/30/19 2200  Full code  Continuous     01/30/19 2203  Code Status History    Date Active Date Inactive Code Status Order ID Comments User Context   01/07/2019 0231 01/08/2019 1755 Full Code [id number]  Oralia ManisWillis, David, MD Inpatient   08/07/2011 1543 08/08/2011 1513 Full Code [id number]  Glynn Octaveancour, Stephen, MD ED   Advance Care Planning Activity     Family Communication: Patient did not want me to speak with family. Disposition Plan: To be determined  Consultants:  Cardiology  Time spent: 35  minutes  Fortino Haag [employer]

## 2019-02-03 NOTE — Progress Notes (Signed)
PT Cancellation Note  Patient Details Name: [name] MRN: [id number] DOB: Jun 20, 1960   Cancelled Treatment:    Reason Eval/Treat Not Completed: Other (comment).  Chart reviewed.  Pt's potassium noted to be up-trending to 5.5 today.  Per PT guidelines for elevated potassium, exertional activity currently contraindicated.  Will hold PT at this time and re-attempt PT treatment session at a later date/time as medically appropriate.   Leitha Bleak, PT 02/03/19, 1:52 PM [phone]

## 2019-02-03 NOTE — Progress Notes (Signed)
Pulmonary Medicine          Date: 02/03/5919,   MRN# [id number] Mike MilletMichael L Dawson 02/21/1959     AdmissionWeight: 71.7 kg                 CurrentWeight: 72.6 kg      CHIEF COMPLAINT:   Acute hypoxemic respiratory failure and pulmonary hypertension   SUBJECTIVE   Patient sitting up in bed eating lunch.  He reports feeling much better. LE edema is resolved. Noted multiple consultant recommendations.    PAST MEDICAL HISTORY   Past Medical History:  Diagnosis Date   Alcohol abuse    Bipolar 1 disorder (HCC)    CHF (congestive heart failure) (HCC)    COPD (chronic obstructive pulmonary disease) (HCC)    Hip pain    Hypertension    Pulmonary HTN (HCC)    Valvular heart disease      SURGICAL HISTORY   Past Surgical History:  Procedure Laterality Date   AORTIC VALVE REPLACEMENT       FAMILY HISTORY   History reviewed. No pertinent family history.   SOCIAL HISTORY   Social History   Tobacco Use   Smoking status: Current Every Day Smoker    Packs/day: 1.00    Types: Cigarettes   Smokeless tobacco: Never Used  Substance Use Topics   Alcohol use: Yes   Drug use: Yes    Types: Cocaine     MEDICATIONS    Home Medication:    Current Medication:  Current Facility-Administered Medications:    0.9 %  sodium chloride infusion, 250 mL, Intravenous, PRN, [name], [name], NP   0.9 %  sodium chloride infusion, , Intravenous, Continuous, [name], [name], MD   acetylcysteine (ACETADOTE) 40 mg/mL load via infusion 10,890 mg, 150 mg/kg, Intravenous, Once **FOLLOWED BY** acetylcysteine (ACETADOTE) 24,000 mg in dextrose 5 % 600 mL (40 mg/mL) infusion, 15 mg/kg/hr, Intravenous, Continuous, Sherryll BurgerShah, Vipul, MD   bisacodyl (DULCOLAX) EC tablet 10 mg, 10 mg, Oral, Daily PRN, [name], [name], MD   feeding supplement (BOOST / RESOURCE BREEZE) liquid 1 Container, 1 Container, Oral, TID BM, [name], [name], MD, 1 Container at 02/01/58 2240    fluticasone (FLONASE) 50 MCG/ACT nasal spray 2 spray, 2 spray, Each Nare, Daily, [name], [name], MD, 2 spray at 02/02/58 1240   folic acid (FOLVITE) tablet 1 mg, 1 mg, Oral, Daily, [name], [name], NP, 1 mg at 02/02/58 1241   heparin injection 5,000 Units, 5,000 Units, Subcutaneous, Q8H, [name], [name], NP, 5,000 Units at 02/02/58 1240   ipratropium-albuterol (DUONEB) 0.5-2.5 (3) MG/3ML nebulizer solution 3 mL, 3 mL, Nebulization, Q6H, [name], [name], MD, 3 mL at 02/02/58 0253   LORazepam (ATIVAN) tablet 1-4 mg, 1-4 mg, Oral, Q1H PRN **OR** LORazepam (ATIVAN) injection 1-4 mg, 1-4 mg, Intravenous, Q1H PRN, [name], [name], NP   MEDLINE mouth rinse, 15 mL, Mouth Rinse, BID, [name], [name], NP, 15 mL at 02/02/58 1243   midodrine (PROAMATINE) tablet 5 mg, 5 mg, Oral, TID WC, [name], [name], MD, 5 mg at 02/02/58 1240   multivitamin with minerals tablet 1 tablet, 1 tablet, Oral, Daily, [name], [name], NP, 1 tablet at 02/02/58 1241   ondansetron (ZOFRAN) injection 4 mg, 4 mg, Intravenous, Q6H PRN, [name], Mike Achieng, NP, 4 mg at 02/01/58 1444   phytonadione (VITAMIN K) SQ injection 10 mg, 10 mg, Subcutaneous, Daily, [name], [name], MD, 10 mg at 02/02/58 1241   pneumococcal 23 valent vaccine (PNU-IMMUNE) injection  0.5 mL, 0.5 mL, Intramuscular, Tomorrow-1000, [name], [name], NP   polyethylene glycol (MIRALAX / GLYCOLAX) packet 17 g, 17 g, Oral, Daily, [name], [name], MD, 17 g at 02/01/58 0959   sodium chloride (OCEAN) 0.65 % nasal spray 1 spray, 1 spray, Each Nare, PRN, [name], [name], MD   sodium chloride flush (NS) 0.9 % injection 3 mL, 3 mL, Intravenous, Q12H, [name], [name], NP, 3 mL at 02/01/58 2241   sodium chloride flush (NS) 0.9 % injection 3 mL, 3 mL, Intravenous, PRN, Lang Snow, NP, 3 mL at 02/01/19 1355   sodium zirconium cyclosilicate (LOKELMA) packet 5 g, 5 g, Oral, Once, [name], MD    thiamine (VITAMIN B-1) tablet 100 mg, 100 mg, Oral, Daily, 100 mg at 02/02/58 1240 **OR** thiamine (B-1) injection 100 mg, 100 mg, Intravenous, Daily, [name], [name], NP    ALLERGIES   Patient has no known allergies.     REVIEW OF SYSTEMS    Review of Systems:  Gen:  Denies  fever, sweats, chills weigh loss  HEENT: Denies blurred vision, double vision, ear pain, eye pain, hearing loss, nose bleeds, sore throat Cardiac:  No dizziness, chest pain or heaviness, chest tightness,edema Resp:   Denies cough or sputum porduction, shortness of breath,wheezing, hemoptysis,  Gi: Denies swallowing difficulty, stomach pain, nausea or vomiting, diarrhea, constipation, bowel incontinence Gu:  Denies bladder incontinence, burning urine Ext:   Denies Joint pain, stiffness or swelling Skin: Denies  skin rash, easy bruising or bleeding or hives Endoc:  Denies polyuria, polydipsia , polyphagia or weight change Psych:   Denies depression, insomnia or hallucinations   Other:  All other systems negative   VS: BP 109/61 (BP Location: Left Arm)    Pulse 71    Temp 97.7 F (36.5 C) (Oral)    Resp 18    Ht 5\' 8"  (1.727 m)    Wt 72.6 kg    SpO2 99%    BMI 24.34 kg/m      PHYSICAL EXAM    GENERAL:NAD, no fevers, chills, no weakness no fatigue HEAD: Normocephalic, atraumatic.  EYES: Pupils equal, round, reactive to light. Extraocular muscles intact. No scleral icterus.  MOUTH: Moist mucosal membrane. Dentition intact. No abscess noted.  EAR, NOSE, THROAT: Clear without exudates. No external lesions.  NECK: Supple. No thyromegaly. No nodules. No JVD.  PULMONARY: mild crackles at bases b/l CARDIOVASCULAR: S1 and S2. Regular rate and rhythm. No murmurs, rubs, or gallops. No edema. Pedal pulses 2+ bilaterally.  GASTROINTESTINAL: Soft, nontender, nondistended. No masses. Positive bowel sounds. No hepatosplenomegaly.  MUSCULOSKELETAL: No swelling, clubbing, or edema. Range of motion full in all  extremities.  NEUROLOGIC: Cranial nerves II through XII are intact. No gross focal neurological deficits. Sensation intact. Reflexes intact.  SKIN: No ulceration, lesions, rashes, or cyanosis. Skin warm and dry. Turgor intact.  PSYCHIATRIC: Mood, affect within normal limits. The patient is awake, alert and oriented x 3. Insight, judgment intact.       IMAGING    Dg Chest 2 View  Result Date: 01/21/2019 CLINICAL DATA:  Dyspnea for few weeks with left lower extremity swelling EXAM: CHEST - 2 VIEW COMPARISON:  01/08/2019 chest radiograph. FINDINGS: Intact sternotomy wires. Mitral annuloplasty ring in place. Stable cardiomediastinal silhouette with mild cardiomegaly. No pneumothorax. Small right pleural effusion is increased. No left pleural effusion. Mild pulmonary edema. Patchy right greater than left bibasilar lung opacities worsened on the right. IMPRESSION: 1. Mild congestive heart failure with increased small right  pleural effusion. 2. Patchy bibasilar lung opacities, right greater than left, worsened on the right, favor atelectasis, cannot exclude a component of aspiration or pneumonia. Chest radiograph follow-up advised. Electronically Signed   By: Delbert Phenix M.D.   On: 01/21/2019 17:28   Dg Chest 2 View  Result Date: 01/08/2019 CLINICAL DATA:  Pulmonary embolism. Hx of copd, smoker EXAM: CHEST - 2 VIEW COMPARISON:  Chest radiograph 01/06/2019 FINDINGS: Stable cardiomediastinal contours with enlarged heart size. Surgical changes status post valve replacement. Stable pleuroparenchymal opacity at the right lung base with fluid extending into the minor fissure. Vascular congestion without overt edema. No acute finding in the visualized skeleton. IMPRESSION: Stable pleuroparenchymal opacity at the right lung base with fluid extending into the minor fissure. Electronically Signed   By: Emmaline Kluver M.D.   On: 01/08/2019 08:37   Dg Chest 2 View  Result Date: 01/06/2019 CLINICAL DATA:   Shortness of breath. EXAM: CHEST - 2 VIEW COMPARISON:  Radiograph 12/28/2018 FINDINGS: Post median sternotomy with prosthetic valve. Similar cardiomegaly and mediastinal contours. Worsening pleuroparenchymal opacity at the right lung base with increasing pleural effusion. Probable fluid in the minor fissure. There is vascular congestion without overt pulmonary edema. No pneumothorax. Unchanged retrocardiac atelectasis. IMPRESSION: 1. Increasing right pleuroparenchymal opacity and pleural effusion over the past 2 days. 2. Cardiomegaly with vascular congestion. Electronically Signed   By: Narda Rutherford M.D.   On: 01/06/2019 20:04   US Renal  Result Date: 02/03/5919 CLINICAL DATA:  Acute renal insufficiency. EXAM: RENAL / URINARY TRACT ULTRASOUND COMPLETE COMPARISON:  None. FINDINGS: Right Kidney: Renal measurements: 11.6 x 5.1 x 5.7 cm = volume: 176 mL. Mild increased cortical echogenicity. No mass or hydronephrosis visualized. Left Kidney: Renal measurements: 12.2 x 5.3 x 5.7 cm = volume: 193 mL. Mild increased cortical echogenicity. No mass or hydronephrosis visualized. Bladder: Appears normal for degree of bladder distention, however not well distended. Other: Small amount of ascites.  Small amount of pleural fluid. IMPRESSION: 1. Normal size kidneys without hydronephrosis. Mild increased cortical echogenicity which can be seen in medical renal disease. 2.  Minimal ascites.  Small amount of pleural fluid. Electronically Signed   By: Elberta Fortis M.D.   On: 02/03/5919 08:13   US Paracentesis  Result Date: 02/02/5919 INDICATION: 58 year old with ascites and abdominal distention. EXAM: ULTRASOUND GUIDED PARACENTESIS MEDICATIONS: None. COMPLICATIONS: None immediate. PROCEDURE: Informed written consent was obtained from the patient after a discussion of the risks, benefits and alternatives to treatment. A timeout was performed prior to the initiation of the procedure. Initial ultrasound scanning  demonstrates a small amount of ascites within the right lower abdominal quadrant. The right lower abdomen was prepped and draped in the usual sterile fashion. 1% lidocaine was used for local anesthesia. Following this, a 19 gauge, 7-cm, Yueh catheter was introduced. An ultrasound image was saved for documentation purposes. The paracentesis was performed. The catheter was removed and a dressing was applied. The patient tolerated the procedure well without immediate post procedural complication. FINDINGS: A total of approximately 650 mL of yellow fluid was removed. Samples were sent to the laboratory as requested by the clinical team. IMPRESSION: Successful ultrasound-guided paracentesis yielding 650 mL of peritoneal fluid. Electronically Signed   By: Richarda Overlie M.D.   On: 02/02/5919 14:37   Dg Chest Portable 1 View  Result Date: 01/30/2019 CLINICAL DATA:  Shortness of breath EXAM: PORTABLE CHEST 1 VIEW COMPARISON:  01/21/2019 FINDINGS: No significant interval change in AP portable examination with small right  pleural effusion and associated atelectasis or consolidation. The left lung is normally aerated. Cardiomegaly status post median sternotomy. IMPRESSION: 1. No significant interval change in AP portable examination with small right pleural effusion and associated atelectasis or consolidation. The left lung is normally aerated. 2.  Cardiomegaly status post median sternotomy. Electronically Signed   By: Lauralyn Primes M.D.   On: 01/30/2019 16:41   Dg Abd 2 Views  Result Date: 01/31/2019 CLINICAL DATA:  Abdominal pain and distension EXAM: ABDOMEN - 2 VIEW COMPARISON:  None. FINDINGS: Nonobstructive pattern of bowel gas with gas present to the rectum. Scattered stool present throughout the colon. No free air in the abdomen. Cardiomegaly in the included lower chest, better assessed by dedicated chest radiograph. 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 IMPRESSION:  Nonobstructive pattern of bowel gas with gas present to the rectum. Scattered stool present throughout the colon. No free air in the abdomen. Electronically Signed   By: Lauralyn Primes M.D.   On: 01/31/2019 17:32   US Abdomen Limited Ruq  Result Date: 02/01/2019 CLINICAL DATA:  Cirrhosis and abdominal distension EXAM: ULTRASOUND ABDOMEN LIMITED RIGHT UPPER QUADRANT COMPARISON:  None. FINDINGS: Gallbladder: No gallstones visualized. The gallbladder wall is diffusely thickened measuring up to 0.7 cm. No sonographic Murphy sign noted by sonographer. Common bile duct: Diameter: 0.4 cm Liver: No focal lesion identified. The liver contour is mildly nodular. Within normal limits in parenchymal echogenicity. Portal vein is patent on color Doppler imaging with normal direction of blood flow towards the liver. Other: Mild right upper quadrant ascites. Small right pleural effusion. IMPRESSION: 1. Mildly nodular contour of the liver consistent with history of cirrhosis. Mild right upper quadrant ascites and small pleural effusion visualized. 2. Gallbladder wall thickening which is a nonspecific finding and can be seen in the setting of gallbladder inflammation, cirrhosis, fluid overload states, hypoalbuminemia, among others. No gallstones visualized. Electronically Signed   By: Emmaline Kluver M.D.   On: 02/01/2019 09:11      ASSESSMENT/PLAN   Acute respiratory distress with mild hypoxemia   -Likely due to fluid overload from continued alcohol abuse and likely liver cirrhosis, patient had ultrasound-guided paracentesis and reports improvement in breathing additionally he has been diuresed and has improved clinically   -stopped ativan and remeron - patient states he is no longer dizzy. t    Pulmonary arterial hypertension   -Likely group 2 and 3 secondary to cardiac disease and chronic COPD   -Treating underlying cause to optimize cardiac and pulmonary function   -We will need outpatient pulmonary  evaluation   Centrilobular emphysema Continue typical COPD care path   Tobacco abuse    -Smoking cessation counseling    -Transdermal nicotine replacement therapy     Thank you Dr. Amado Coe for allowing me to participate in the care of this patient.   Patient/Family are satisfied with care plan and all questions have been answered.  This document was prepared using Dragon voice recognition software and may include unintentional dictation errors.     Vida Rigger, M.D.  Division of Pulmonary & Critical Care Medicine  [facility]

## 2019-02-03 NOTE — Progress Notes (Signed)
[facility] Cardiology [facility] Encounter Note  Patient: [name] / Admit Date: 01/30/2019 / Date of Encounter: 02/03/2019, 8:55 AM   Subjective: Patient is still short of breath with some chest fullness as well as significant abdominal fullness most consistent with ascites as evaluated by paracentesis and further physical exam and laboratory exam by gastroenterology as well as pulmonology..  No evidence of significant amount of rails or pulmonary edema by chest x-ray.  Echocardiogram does show some pulmonary hypertension possibly contributing to edema secondary to possible respiratory and/or liver injury.  Patient does have some worsening chronic kidney disease likely secondary to a slight overdiuresis which is being addressed at this time with some hydration.  Troponin elevation of 28 consistent with demand ischemia rather than acute coronary syndrome  Review of Systems: Positive for: Shortness of breath and abdominal fullness Negative for: Vision change, hearing change, syncope, dizziness, nausea, vomiting,diarrhea, bloody stool, stomach pain, cough, congestion, diaphoresis, urinary frequency, urinary pain,skin lesions, skin rashes Others previously listed  Objective: Telemetry: Normal sinus rhythm Physical Exam: Blood pressure 102/77, pulse 72, temperature (!) 97.5 F (36.4 C), temperature source Oral, resp. rate 16, height  (1.727 m), weight 72.6 kg, SpO2 95 %. Body mass index is 24.34 kg/m. General: Well developed, well nourished, in no acute distress. Head: Normocephalic, atraumatic, sclera non-icteric, no xanthomas, nares are without discharge. Neck: No apparent masses Lungs: Normal respirations with few wheezes, no rhonchi, no rales , no crackles   Heart: Regular rate and rhythm, normal S1 S2, right upper sternal murmur, no rub, no gallop, PMI is normal size and placement, carotid upstroke normal without bruit, jugular venous pressure normal Abdomen: Soft, non-tender,   distended with normoactive bowel sounds. No hepatosplenomegaly. Abdominal aorta is normal size without bruit Extremities: Trace edema, no clubbing, no cyanosis, no ulcers,  Peripheral: 2+ radial, 2+ femoral, 2+ dorsal pedal pulses Neuro: Alert and oriented. Moves all extremities spontaneously. Psych:  Responds to questions appropriately with a normal affect.   Intake/Output Summary (Last 24 hours) at 02/03/2019 0855 Last data filed at 02/02/2019 2015 Gross per 24 hour  Intake -  Output 100 ml  Net -100 ml    Inpatient Medications:  . feeding supplement  1 Container Oral TID BM  . fluticasone  2 spray Each Nare Daily  . folic acid  1 mg Oral Daily  . heparin  5,000 Units Subcutaneous Q8H  . ipratropium-albuterol  3 mL Nebulization Q6H  . mouth rinse  15 mL Mouth Rinse BID  . multivitamin with minerals  1 tablet Oral Daily  . phytonadione  10 mg Subcutaneous Daily  . pneumococcal 23 valent vaccine  0.5 mL Intramuscular Tomorrow-1000  . polyethylene glycol  17 g Oral Daily  . sodium chloride flush  3 mL Intravenous Q12H  . sodium zirconium cyclosilicate  5 g Oral Once  . thiamine  100 mg Oral Daily   Or  . thiamine  100 mg Intravenous Daily   Infusions:  . sodium chloride      Labs: Recent Labs    02/01/19 2323 02/02/19 0525 02/03/19 0527  NA 136 136 135  K 5.9* 5.3* 5.5*  CL 99 101 101  CO2 22 24 21*  GLUCOSE 91 104* 135*  BUN 54* 62* 82*  CREATININE 3.17* 3.51* 4.67*  CALCIUM 8.8* 8.8* 8.3*  MG 2.2  --   --   PHOS 7.4*  --   --    Recent Labs    02/02/19 0525 02/03/19 0527  AST 1,318* 2,174*  ALT 624* 923*  ALKPHOS 149* 130*  BILITOT 2.2* 1.9*  PROT 6.6 6.3*  ALBUMIN 3.1* 3.2*   Recent Labs    02/01/19 2323 02/02/19 0525  WBC 12.3* 10.7*  NEUTROABS  --  7.7  HGB 12.9* 13.1  HCT 40.5 40.2  MCV 80.7 78.8*  PLT 207 195   No results for input(s): CKTOTAL, CKMB, TROPONINI in the last 72 hours. Invalid input(s): POCBNP No results for input(s):  HGBA1C in the last 72 hours.   Weights: Filed Weights   02/01/19 0353 02/02/19 0444 02/03/19 0354  Weight: 70.6 kg 71.5 kg 72.6 kg     Radiology/Studies:  Dg Chest 2 View  Result Date: 01/21/2019 CLINICAL DATA:  Dyspnea for few weeks with left lower extremity swelling EXAM: CHEST - 2 VIEW COMPARISON:  01/08/2019 chest radiograph. FINDINGS: Intact sternotomy wires. Mitral annuloplasty ring in place. Stable cardiomediastinal silhouette with mild cardiomegaly. No pneumothorax. Small right pleural effusion is increased. No left pleural effusion. Mild pulmonary edema. Patchy right greater than left bibasilar lung opacities worsened on the right. IMPRESSION: 1. Mild congestive heart failure with increased small right pleural effusion. 2. Patchy bibasilar lung opacities, right greater than left, worsened on the right, favor atelectasis, cannot exclude a component of aspiration or pneumonia. Chest radiograph follow-up advised. Electronically Signed   By: Delbert Phenix M.D.   On: 01/21/2019 17:28   Dg Chest 2 View  Result Date: 01/08/2019 CLINICAL DATA:  Pulmonary embolism. Hx of copd, smoker EXAM: CHEST - 2 VIEW COMPARISON:  Chest radiograph 01/06/2019 FINDINGS: Stable cardiomediastinal contours with enlarged heart size. Surgical changes status post valve replacement. Stable pleuroparenchymal opacity at the right lung base with fluid extending into the minor fissure. Vascular congestion without overt edema. No acute finding in the visualized skeleton. IMPRESSION: Stable pleuroparenchymal opacity at the right lung base with fluid extending into the minor fissure. Electronically Signed   By: Emmaline Kluver M.D.   On: 01/08/2019 08:37   Dg Chest 2 View  Result Date: 01/06/2019 CLINICAL DATA:  Shortness of breath. EXAM: CHEST - 2 VIEW COMPARISON:  Radiograph 12/28/2018 FINDINGS: Post median sternotomy with prosthetic valve. Similar cardiomegaly and mediastinal contours. Worsening pleuroparenchymal opacity  at the right lung base with increasing pleural effusion. Probable fluid in the minor fissure. There is vascular congestion without overt pulmonary edema. No pneumothorax. Unchanged retrocardiac atelectasis. IMPRESSION: 1. Increasing right pleuroparenchymal opacity and pleural effusion over the past 2 days. 2. Cardiomegaly with vascular congestion. Electronically Signed   By: Narda Rutherford M.D.   On: 01/06/2019 20:04   US Renal  Result Date: 02/03/2019 CLINICAL DATA:  Acute renal insufficiency. EXAM: RENAL / URINARY TRACT ULTRASOUND COMPLETE COMPARISON:  None. FINDINGS: Right Kidney: Renal measurements: 11.6 x 5.1 x 5.7 cm = volume: 176 mL. Mild increased cortical echogenicity. No mass or hydronephrosis visualized. Left Kidney: Renal measurements: 12.2 x 5.3 x 5.7 cm = volume: 193 mL. Mild increased cortical echogenicity. No mass or hydronephrosis visualized. Bladder: Appears normal for degree of bladder distention, however not well distended. Other: Small amount of ascites.  Small amount of pleural fluid. IMPRESSION: 1. Normal size kidneys without hydronephrosis. Mild increased cortical echogenicity which can be seen in medical renal disease. 2.  Minimal ascites.  Small amount of pleural fluid. Electronically Signed   By: Elberta Fortis M.D.   On: 02/03/2019 08:13   US Paracentesis  Result Date: 02/02/2019 INDICATION: 58 year old with ascites and abdominal distention. EXAM: ULTRASOUND GUIDED PARACENTESIS MEDICATIONS: None.  COMPLICATIONS: None immediate. PROCEDURE: Informed written consent was obtained from the patient after a discussion of the risks, benefits and alternatives to treatment. A timeout was performed prior to the initiation of the procedure. Initial ultrasound scanning demonstrates a small amount of ascites within the right lower abdominal quadrant. The right lower abdomen was prepped and draped in the usual sterile fashion. 1% lidocaine was used for local anesthesia. Following this, a 19  gauge, 7-cm, Yueh catheter was introduced. An ultrasound image was saved for documentation purposes. The paracentesis was performed. The catheter was removed and a dressing was applied. The patient tolerated the procedure well without immediate post procedural complication. FINDINGS: A total of approximately 650 mL of yellow fluid was removed. Samples were sent to the laboratory as requested by the clinical team. IMPRESSION: Successful ultrasound-guided paracentesis yielding 650 mL of peritoneal fluid. Electronically Signed   By: Markus Daft M.D.   On: 02/02/2019 14:37   Dg Chest Portable 1 View  Result Date: 01/30/2019 CLINICAL DATA:  Shortness of breath EXAM: PORTABLE CHEST 1 VIEW COMPARISON:  01/21/2019 FINDINGS: No significant interval change in AP portable examination with small right pleural effusion and associated atelectasis or consolidation. The left lung is normally aerated. Cardiomegaly status post median sternotomy. IMPRESSION: 1. No significant interval change in AP portable examination with small right pleural effusion and associated atelectasis or consolidation. The left lung is normally aerated. 2.  Cardiomegaly status post median sternotomy. Electronically Signed   By: Eddie Candle M.D.   On: 01/30/2019 16:41   Dg Abd 2 Views  Result Date: 01/31/2019 CLINICAL DATA:  Abdominal pain and distension EXAM: ABDOMEN - 2 VIEW COMPARISON:  None. FINDINGS: Nonobstructive pattern of bowel gas with gas present to the rectum. Scattered stool present throughout the colon. No free air in the abdomen. Cardiomegaly in the included lower chest, better assessed by dedicated chest radiograph. 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 IMPRESSION: Nonobstructive pattern of bowel gas with gas present to the rectum. Scattered stool present throughout the colon. No free air in the abdomen. Electronically Signed   By: Eddie Candle M.D.   On: 01/31/2019 17:32   US Abdomen  Limited Ruq  Result Date: 02/01/2019 CLINICAL DATA:  Cirrhosis and abdominal distension EXAM: ULTRASOUND ABDOMEN LIMITED RIGHT UPPER QUADRANT COMPARISON:  None. FINDINGS: Gallbladder: No gallstones visualized. The gallbladder wall is diffusely thickened measuring up to 0.7 cm. No sonographic Murphy sign noted by sonographer. Common bile duct: Diameter: 0.4 cm Liver: No focal lesion identified. The liver contour is mildly nodular. Within normal limits in parenchymal echogenicity. Portal vein is patent on color Doppler imaging with normal direction of blood flow towards the liver. Other: Mild right upper quadrant ascites. Small right pleural effusion. IMPRESSION: 1. Mildly nodular contour of the liver consistent with history of cirrhosis. Mild right upper quadrant ascites and small pleural effusion visualized. 2. Gallbladder wall thickening which is a nonspecific finding and can be seen in the setting of gallbladder inflammation, cirrhosis, fluid overload states, hypoalbuminemia, among others. No gallstones visualized. Electronically Signed   By: Audie Pinto M.D.   On: 02/01/2019 09:11     Assessment and Recommendation  58 y.o. male with acute on chronic diastolic dysfunction heart failure with elevated BNP and demand ischemia by above with elevated troponin slightly improved but mainly yes likely suffering from concerns of ascites secondary to liver injury rather than primarily cardiac injury and/or etiology  1.  Hydration for acute kidney injury on  chronic kidney injury and liver injury and watching closely for any pulmonary edema currently not seen by chest x-ray or exam 2.  No further cardiac diagnostics due to no evidence of acute coronary syndrome 3.  Continue supportive care for ascites and liver injury as per medicine 4.  Continue beta-blocker for heart rate control and heart failure 5.  Begin ambulation and follow for improvements of symptoms and possible discharge home from cardiovascular  standpoint with follow-up next week for further adjustments of medication management 6.  Call if further questions Signed, Arnoldo HookerBruce Oseas Detty M.D. FACC

## 2019-02-03 NOTE — Consult Note (Addendum)
Consultation Note Date: 02/03/2019   Patient Name: [name]  DOB: 13-Jun-1960  MRN: [id number]  Age / Sex: 58 y.o., male  PCP: Medicine, [place] Referring Physician: [name], MD  Reason for Consultation: Establishing goals of care  HPI/Patient Profile: 58 y.o. male  with past medical history of CHF, s/p aortic valve replacement, pulmonary hypertension, COPD, HTN, CKD 2, Hep C s/p Harvoni treatment, bipolar disease admitted on 01/30/2019 with SOB with CHF exacerbation, COPD exacerbation, worsening liver failure s/p paracentesis 02/02/19 (likely underlying cirrhosis), worsening renal failure.   Clinical Assessment and Goals of Care: I met today with [name] along with NP student, Roselyn Reef, at bedside. [name] tells me that he is very tired and has no energy. He has been in the process of being worked up for left hip replacement. He was having SOB and swelling especially in his leg. He reports that the swelling is much improved but he is just tired and feels poorly.   We spent time discussing his renal failure along with CHF and liver failure. We discussed the complexity of multiorgan involvement creating barriers for improvement. I explained that he has a good medical team doing everything possible to help him to improve. However, I explained that there is always the chance that he could further decline. He is hoping that he is not faced with dialysis. He does at this time want full aggressive care including CPR and intubation as indicated. I am unsure if he understands the severity of his illness. However, at the same time he appears in much better health than his labs indicate and is not having many symptoms other than fatigue and headache.   We also discussed HCPOA and he has a mother who is 51 yo that he helps to care for but she has increasing health issues. He believes that his sister, Cleda Mccreedy, would be  the person he would desire to make decisions for him but this is not documented and he has multiple siblings. He does have children but no relationship. I also addressed his alcohol abuse and encouraged him to consider resources to assist him to achieve and maintain sobriety. I told him that even small amount of alcohol will cause significant damage and complications with his liver disease and multiple comorbidities.   All questions/concerns addressed. Emotional support provided.   Primary Decision Maker PATIENT    SUMMARY OF RECOMMENDATIONS   - Full aggressive care desired  Code Status/Advance Care Planning:  Full code   Symptom Management:   Per primary, renal, cardiology  Palliative Prophylaxis:   Bowel Regimen, Delirium Protocol and Frequent Pain Assessment  Additional Recommendations (Limitations, Scope, Preferences):  Full Scope Treatment  Psycho-social/Spiritual:   Desire for further Chaplaincy support:no  Additional Recommendations: Grief/Bereavement Support  Prognosis:   Overall prognosis poor with severe comorbidities. High risk for acute decompensation.   Discharge Planning: To Be Determined      Primary Diagnoses: Present on Admission: **None**   I have reviewed the medical record, interviewed the patient and family,  and examined the patient. The following aspects are pertinent.  Past Medical History:  Diagnosis Date  . Alcohol abuse   . Bipolar 1 disorder ([place])   . CHF (congestive heart failure) ([place])   . COPD (chronic obstructive pulmonary disease) ([place])   . Hip pain   . Hypertension   . Pulmonary HTN ([place])   . Valvular heart disease    Social History   Socioeconomic History  . Marital status: Single    Spouse name: Not on file  . Number of children: Not on file  . Years of education: Not on file  . Highest education level: Not on file  Occupational History  . Occupation: disabled  Social Needs  . Financial resource strain: Not on  file  . Food insecurity    Worry: Not on file    Inability: Not on file  . Transportation needs    Medical: Not on file    Non-medical: Not on file  Tobacco Use  . Smoking status: Current Every Day Smoker    Packs/day: 1.00    Types: Cigarettes  . Smokeless tobacco: Never Used  Substance and Sexual Activity  . Alcohol use: Yes  . Drug use: Yes    Types: Cocaine  . Sexual activity: Not on file  Lifestyle  . Physical activity    Days per week: Not on file    Minutes per session: Not on file  . Stress: Not on file  Relationships  . Social Herbalist on phone: Not on file    Gets together: Not on file    Attends religious service: Not on file    Active member of club or organization: Not on file    Attends meetings of clubs or organizations: Not on file    Relationship status: Not on file  Other Topics Concern  . Not on file  Social History Narrative  . Not on file   History reviewed. No pertinent family history. Scheduled Meds: . acetylcysteine  150 mg/kg Intravenous Once  . feeding supplement  1 Container Oral TID BM  . fluticasone  2 spray Each Nare Daily  . folic acid  1 mg Oral Daily  . heparin  5,000 Units Subcutaneous Q8H  . ipratropium-albuterol  3 mL Nebulization Q6H  . mouth rinse  15 mL Mouth Rinse BID  . multivitamin with minerals  1 tablet Oral Daily  . phytonadione  10 mg Subcutaneous Daily  . pneumococcal 23 valent vaccine  0.5 mL Intramuscular Tomorrow-1000  . polyethylene glycol  17 g Oral Daily  . sodium chloride flush  3 mL Intravenous Q12H  . sodium zirconium cyclosilicate  5 g Oral Once  . thiamine  100 mg Oral Daily   Or  . thiamine  100 mg Intravenous Daily   Continuous Infusions: . sodium chloride    . acetylcysteine     PRN Meds:.sodium chloride, bisacodyl, LORazepam **OR** LORazepam, ondansetron (ZOFRAN) IV, sodium chloride, sodium chloride flush No Known Allergies Review of Systems  Constitutional: Positive for activity  change and fatigue.  Respiratory: Negative for shortness of breath.   Cardiovascular: Negative for leg swelling.  Neurological: Positive for weakness.    Physical Exam Constitutional:      General: He is not in acute distress.    Appearance: He is ill-appearing.  Cardiovascular:     Rate and Rhythm: Normal rate.  Pulmonary:     Effort: Pulmonary effort is normal. No tachypnea, prolonged expiration or retractions.  Abdominal:  Palpations: Abdomen is soft.  Neurological:     Mental Status: He is alert and oriented to person, place, and time.     Vital Signs: BP 109/61 (BP Location: Left Arm)   Pulse 71   Temp 97.7 F (36.5 C) (Oral)   Resp 18   Ht _0  (1.727 m)   Wt 72.6 kg   SpO2 99%   BMI 24.34 kg/m  Pain Scale: 0-10   Pain Score: 0-No pain   SpO2: SpO2: 99 % O2 Device:SpO2: 99 % O2 Flow Rate: .O2 Flow Rate (L/min): 2 L/min  IO: Intake/output summary:   Intake/Output Summary (Last 24 hours) at 02/03/2019 1042 Last data filed at 02/03/2019 0953 Gross per 24 hour  Intake 240 ml  Output 150 ml  Net 90 ml    LBM: Last BM Date: 01/31/19 Baseline Weight: Weight: 71.7 kg Most recent weight: Weight: 72.6 kg     Palliative Assessment/Data:     Time In: 1030 Time Out: 1120 Time Total: 50 min Greater than 50%  of this time was spent counseling and coordinating care related to the above assessment and plan.  Signed by: Vinie Sill, NP Palliative Medicine Team Pager # [phone] (M-F 8a-5p) Team Phone # [phone] (Nights/Weekends)

## 2019-02-04 ENCOUNTER — Inpatient Hospital Stay
Admit: 2019-02-04 | Discharge: 2019-02-04 | Disposition: A | Discharge status: Home / Self Care | Payer: Medicaid Other | Attending: Internal Medicine | Admitting: Internal Medicine

## 2019-02-04 ENCOUNTER — Inpatient Hospital Stay: Payer: Medicaid Other

## 2019-02-04 DIAGNOSIS — K759 Inflammatory liver disease, unspecified: Secondary | ICD-10-CM

## 2019-02-04 DIAGNOSIS — F1410 Cocaine abuse, uncomplicated: Secondary | ICD-10-CM | POA: Diagnosis not present

## 2019-02-04 DIAGNOSIS — K7200 Acute and subacute hepatic failure without coma: Secondary | ICD-10-CM | POA: Diagnosis not present

## 2019-02-04 LAB — COMPREHENSIVE METABOLIC PANEL
ALT: 1037 U/L — ABNORMAL HIGH (ref 0–44)
AST: 1758 U/L — ABNORMAL HIGH (ref 15–41)
Albumin: 3 g/dL — ABNORMAL LOW (ref 3.5–5.0)
Alkaline Phosphatase: 132 U/L — ABNORMAL HIGH (ref 38–126)
Anion gap: 16 — ABNORMAL HIGH (ref 5–15)
BUN: 93 mg/dL — ABNORMAL HIGH (ref 6–20)
CO2: 20 mmol/L — ABNORMAL LOW (ref 22–32)
Calcium: 8.2 mg/dL — ABNORMAL LOW (ref 8.9–10.3)
Chloride: 98 mmol/L (ref 98–111)
Creatinine, Ser: 4.91 mg/dL — ABNORMAL HIGH (ref 0.61–1.24)
GFR calc Af Amer: 14 mL/min — ABNORMAL LOW (ref >60)
GFR calc non Af Amer: 12 mL/min — ABNORMAL LOW (ref >60)
Glucose, Bld: 93 mg/dL (ref 70–99)
Potassium: 4.3 mmol/L (ref 3.5–5.1)
Sodium: 134 mmol/L — ABNORMAL LOW (ref 135–145)
Total Bilirubin: 1.6 mg/dL — ABNORMAL HIGH (ref 0.3–1.2)
Total Protein: 6.3 g/dL — ABNORMAL LOW (ref 6.5–8.1)

## 2019-02-04 LAB — URINE DRUG SCREEN, QUALITATIVE (ARMC ONLY)
Amphetamines, Ur Screen: NOT DETECTED
Barbiturates, Ur Screen: NOT DETECTED
Benzodiazepine, Ur Scrn: NOT DETECTED
Cannabinoid 50 Ng, Ur ~~LOC~~: NOT DETECTED
Cocaine Metabolite,Ur ~~LOC~~: POSITIVE — AB
MDMA (Ecstasy)Ur Screen: NOT DETECTED
Methadone Scn, Ur: NOT DETECTED
Opiate, Ur Screen: NOT DETECTED
Phencyclidine (PCP) Ur S: NOT DETECTED
Tricyclic, Ur Screen: NOT DETECTED

## 2019-02-04 LAB — CBC
HCT: 36.7 % — ABNORMAL LOW (ref 39.0–52.0)
Hemoglobin: 12.2 g/dL — ABNORMAL LOW (ref 13.0–17.0)
MCH: 25.6 pg — ABNORMAL LOW (ref 26.0–34.0)
MCHC: 33.2 g/dL (ref 30.0–36.0)
MCV: 76.9 fL — ABNORMAL LOW (ref 80.0–100.0)
Platelets: 93 10*3/uL — ABNORMAL LOW (ref 150–400)
RBC: 4.77 MIL/uL (ref 4.22–5.81)
RDW: 16.8 % — ABNORMAL HIGH (ref 11.5–15.5)
WBC: 9.8 10*3/uL (ref 4.0–10.5)
nRBC: 1.8 % — ABNORMAL HIGH (ref 0.0–0.2)

## 2019-02-04 LAB — ECHOCARDIOGRAM COMPLETE
Height: 68 in
Weight: 2678.4 oz

## 2019-02-04 LAB — URINALYSIS, ROUTINE W REFLEX MICROSCOPIC
Bilirubin Urine: NEGATIVE
Glucose, UA: NEGATIVE mg/dL
Hgb urine dipstick: NEGATIVE
Ketones, ur: 5 mg/dL — AB
Leukocytes,Ua: NEGATIVE
Nitrite: NEGATIVE
Protein, ur: NEGATIVE mg/dL
Specific Gravity, Urine: 1.017 (ref 1.005–1.030)
pH: 5 (ref 5.0–8.0)

## 2019-02-04 LAB — EPSTEIN-BARR VIRUS VCA, IGM: EBV VCA IgM: <36 U/mL (ref 0.0–35.9)

## 2019-02-04 LAB — CK: Total CK: 270 U/L (ref 49–397)

## 2019-02-04 LAB — CYTOLOGY - NON PAP

## 2019-02-04 LAB — PROTIME-INR
INR: 3.2 — ABNORMAL HIGH (ref 0.8–1.2)
Prothrombin Time: 32.5 seconds — ABNORMAL HIGH (ref 11.4–15.2)

## 2019-02-04 LAB — LIPASE, FLUID: Lipase-Fluid: 10 U/L

## 2019-02-04 LAB — CMV IGM: CMV IgM: <30 AU/mL (ref 0.0–29.9)

## 2019-02-04 LAB — TOTAL BILIRUBIN, BODY FLUID: Total bilirubin, fluid: 0.6 mg/dL

## 2019-02-04 LAB — HEPATITIS B E ANTIGEN: Hep B E Ag: NEGATIVE

## 2019-02-04 MED ORDER — IOHEXOL 9 MG/ML PO SOLN
500.0000 mL | ORAL | Status: AC
  Administered 2019-02-04 (×2): 500 mL via ORAL

## 2019-02-04 MED ORDER — IPRATROPIUM-ALBUTEROL 0.5-2.5 (3) MG/3ML IN SOLN: 3.0000 mL | Freq: Four times a day (QID) | RESPIRATORY_TRACT | Status: DC | PRN

## 2019-02-04 NOTE — Progress Notes (Signed)
PT Cancellation Note  Patient Details Name: [name] MRN: [id number] DOB: [birth year]   Cancelled Treatment:    Reason Eval/Treat Not Completed: Other (comment).  Chart reviewed.  Pt discussed with pt's nurse.  Pt currently focusing on drinking contrast for imaging.  Will re-attempt PT treatment session at a later date/time.  Leitha Bleak, PT 02/04/19, 9:52 AM [phone]

## 2019-02-04 NOTE — Progress Notes (Signed)
Physical Therapy Treatment Patient Details Name: [name] MRN: [id number] DOB: 03-31-1961 Today's Date: 02/04/2019    History of Present Illness 58 yo male with family assistance has been admitted for SOB and new use of 2L O2 with pulm edema and pleural effusion.  Has been diuresed for wgt gain from chronic CHF.  Has AKI, current tobacco use, acute COPD exacerbation.  PMHx: bipolar, hip pain with walker needed, CHF, COPD, HTN, HLD,     PT Comments    Pt reporting being tired today but agreeable to walk around nursing station and then pt wanted to rest.  Pt independent with transfers and CGA to SBA ambulating around nursing loop no AD (pt declined walker use).  Pt reports needing L hip surgery d/t L hip issues (decreased stance time noted L LE with increased B lateral sway; no loss of balance noted).  Pt reporting "pressure" in his head whenever he stood (pt reports this has been happening for last 2-3 weeks) but resolves with rest (pt reporting 9/10 head "pressure" end of session but pt reporting it was "easing" up with rest in bed: nurse notified).  Will continue to focus on higher level of balance and strengthening during hospital stay.   Follow Up Recommendations  Outpatient PT     Equipment Recommendations  None recommended by PT    Recommendations for Other Services       Precautions / Restrictions Precautions Precautions: None Restrictions Weight Bearing Restrictions: No    Mobility  Bed Mobility Overal bed mobility: Independent             General bed mobility comments: Sit to supine without any noted difficulties  Transfers Overall transfer level: Independent Equipment used: None             General transfer comment: steady safe transfers noted  Ambulation/Gait Ambulation/Gait assistance: Min guard;Supervision Gait Distance (Feet): 200 Feet Assistive device: None Gait Pattern/deviations: Step-through pattern;Wide base of support;Decreased weight  shift to left Gait velocity: decreased   General Gait Details: increased B lateral sway with decreased stance time L LE; occasional use of railing in hallway for balance but overall steady   Stairs             Wheelchair Mobility    Modified Rankin (Stroke Patients Only)       Balance Overall balance assessment: Needs assistance Sitting-balance support: No upper extremity supported;Feet supported Sitting balance-Leahy Scale: Normal Sitting balance - Comments: steady sitting reaching outside BOS   Standing balance support: No upper extremity supported Standing balance-Leahy Scale: Good Standing balance comment: no loss of balance with ambulation but occasionally reaching for railing in hallway for balance (anticipate d/t increased B lateral sway)                            Cognition Arousal/Alertness: Awake/alert Behavior During Therapy: WFL for tasks assessed/performed Overall Cognitive Status: Within Functional Limits for tasks assessed                                        Exercises      General Comments   Nursing cleared pt for participation in physical therapy.  Pt agreeable to limited PT session d/t feeling tired.      Pertinent Vitals/Pain Pain Assessment: 0-10 Pain Score: 9  Pain Location: pressure in head Pain Descriptors / Indicators:  Pressure;Headache Pain Intervention(s): Limited activity within patient's tolerance;Monitored during session;Repositioned;Other (comment)(RN notified)  Vitals (HR and O2 on room air) stable and WFL throughout treatment session.    Home Living                      Prior Function            PT Goals (current goals can now be found in the care plan section) Acute Rehab PT Goals Patient Stated Goal: go home PT Goal Formulation: With patient Time For Goal Achievement: 02/14/19 Potential to Achieve Goals: Good Progress towards PT goals: Progressing toward goals     Frequency    Min 2X/week      PT Plan Discharge plan needs to be updated (SW notified)    Co-evaluation              AM-PAC PT "6 Clicks" Mobility   Outcome Measure  Help needed turning from your back to your side while in a flat bed without using bedrails?: None Help needed moving from lying on your back to sitting on the side of a flat bed without using bedrails?: None Help needed moving to and from a bed to a chair (including a wheelchair)?: None Help needed standing up from a chair using your arms (e.g., wheelchair or bedside chair)?: None Help needed to walk in hospital room?: None Help needed climbing 3-5 steps with a railing? : A Little 6 Click Score: 23    End of Session Equipment Utilized During Treatment: Gait belt Activity Tolerance: Patient tolerated treatment well Patient left: in bed;with call bell/phone within reach Nurse Communication: Mobility status;Precautions;Other (comment)(pt's pain status) PT Visit Diagnosis: Unsteadiness on feet (R26.81);Muscle weakness (generalized) (M62.81);Pain Pain - Right/Left: Left Pain - part of body: Hip     Time: 8938-1017 PT Time Calculation (min) (ACUTE ONLY): 15 min  Charges:  $Therapeutic Exercise: 8-22 mins                    Leitha Bleak, PT 02/04/19, 4:18 PM [phone]

## 2019-02-04 NOTE — Progress Notes (Signed)
Pulmonary Medicine          Date: 02/04/2019,   MRN# [id number] [name] 03-Jul-1958     AdmissionWeight: 71.7 kg                 CurrentWeight: 75.9 kg      CHIEF COMPLAINT:   Acute hypoxemic respiratory failure and pulmonary hypertension   SUBJECTIVE   Patient was hallucinating this morning, states he heard people yelling his name and some other things.   He has worsening crackles at bases bilaterally.    PAST MEDICAL HISTORY   Past Medical History:  Diagnosis Date   Alcohol abuse    Bipolar 1 disorder (HCC)    CHF (congestive heart failure) (HCC)    COPD (chronic obstructive pulmonary disease) (HCC)    Hip pain    Hypertension    Pulmonary HTN (HCC)    Valvular heart disease      SURGICAL HISTORY   Past Surgical History:  Procedure Laterality Date   AORTIC VALVE REPLACEMENT       FAMILY HISTORY   History reviewed. No pertinent family history.   SOCIAL HISTORY   Social History   Tobacco Use   Smoking status: Current Every Day Smoker    Packs/day: 1.00    Types: Cigarettes   Smokeless tobacco: Never Used  Substance Use Topics   Alcohol use: Yes   Drug use: Yes    Types: Cocaine     MEDICATIONS    Home Medication:    Current Medication:  Current Facility-Administered Medications:    0.9 %  sodium chloride infusion, 250 mL, Intravenous, PRN, [name], [name], NP   [COMPLETED] acetylcysteine (ACETADOTE) 40 mg/mL load via infusion 10,890 mg, 150 mg/kg, Intravenous, Once, 10,890 mg at 02/03/19 1300 **FOLLOWED BY** acetylcysteine (ACETADOTE) 24,000 mg in dextrose 5 % 600 mL (40 mg/mL) infusion, 15 mg/kg/hr, Intravenous, Continuous, Manuella Ghazi, Vipul, MD, Last Rate: 27.2 mL/hr at 02/04/19 0233, 15 mg/kg/hr at 02/04/19 0233   bisacodyl (DULCOLAX) EC tablet 10 mg, 10 mg, Oral, Daily PRN, [name], [name], MD   feeding supplement (BOOST / RESOURCE BREEZE) liquid 1 Container, 1 Container, Oral, TID BM, [name], Tally Due, MD, 1 Container at 02/03/19 2205   fluticasone (FLONASE) 50 MCG/ACT nasal spray 2 spray, 2 spray, Each Nare, Daily, [name], [name], MD, 2 spray at 83/41/96 2229   folic acid (FOLVITE) tablet 1 mg, 1 mg, Oral, Daily, [name], [name], NP, 1 mg at 02/03/19 1241   iohexol (OMNIPAQUE) 9 MG/ML oral solution 500 mL, 500 mL, Oral, Q1H, Manuella Ghazi, Vipul, MD, 500 mL at 02/04/19 0851   ipratropium-albuterol (DUONEB) 0.5-2.5 (3) MG/3ML nebulizer solution 3 mL, 3 mL, Nebulization, Q6H, [name], [name], MD, 3 mL at 02/04/19 0800   lactulose (CHRONULAC) 10 GM/15ML solution 20 g, 20 g, Oral, Daily, [name], Tally Due, MD   MEDLINE mouth rinse, 15 mL, Mouth Rinse, BID, [name], [name], NP, 15 mL at 02/03/19 2205   midodrine (PROAMATINE) tablet 5 mg, 5 mg, Oral, TID WC, [name], [name], MD, 5 mg at 02/04/19 0851   multivitamin with minerals tablet 1 tablet, 1 tablet, Oral, Daily, [name], [name], NP, 1 tablet at 02/03/19 1241   ondansetron (ZOFRAN) injection 4 mg, 4 mg, Intravenous, Q6H PRN, Lang Snow, NP, 4 mg at 02/02/19 1444   phytonadione (VITAMIN K) SQ injection 10 mg, 10 mg, Subcutaneous, Daily, [name], Tally Due, MD, 10 mg at 02/03/19 1241   pneumococcal 23 valent vaccine (PNU-IMMUNE)  injection 0.5 mL, 0.5 mL, Intramuscular, Tomorrow-1000, [name], [name], NP   polyethylene glycol (MIRALAX / GLYCOLAX) packet 17 g, 17 g, Oral, Daily, [name], [name], MD, 17 g at 02/02/19 0959   sodium chloride (OCEAN) 0.65 % nasal spray 1 spray, 1 spray, Each Nare, PRN, [name], [name], MD   sodium chloride flush (NS) 0.9 % injection 3 mL, 3 mL, Intravenous, Q12H, [name], [name], NP, 3 mL at 02/03/19 1252   sodium chloride flush (NS) 0.9 % injection 3 mL, 3 mL, Intravenous, PRN, [name], [name], NP, 3 mL at 02/01/19 1355   thiamine (VITAMIN B-1) tablet 100 mg, 100 mg, Oral, Daily, 100 mg at 02/03/19 1240 **OR** thiamine (B-1) injection 100 mg, 100  mg, Intravenous, Daily, [name], [name], NP    ALLERGIES   Patient has no known allergies.     REVIEW OF SYSTEMS    Review of Systems:  Gen:  Denies  fever, sweats, chills weigh loss  HEENT: Denies blurred vision, double vision, ear pain, eye pain, hearing loss, nose bleeds, sore throat Cardiac:  No dizziness, chest pain or heaviness, chest tightness,edema Resp:   Denies cough or sputum porduction, shortness of breath,wheezing, hemoptysis,  Gi: Denies swallowing difficulty, stomach pain, nausea or vomiting, diarrhea, constipation, bowel incontinence Gu:  Denies bladder incontinence, burning urine Ext:   Denies Joint pain, stiffness or swelling Skin: Denies  skin rash, easy bruising or bleeding or hives Endoc:  Denies polyuria, polydipsia , polyphagia or weight change Psych:   Denies depression, insomnia or hallucinations   Other:  All other systems negative   VS: BP 118/68 (BP Location: Left Arm)    Pulse 73    Temp 97.6 F (36.4 C) (Oral)    Resp 17    Ht 5\' 8"  (1.727 m)    Wt 75.9 kg    SpO2 97%    BMI 25.45 kg/m      PHYSICAL EXAM    GENERAL:NAD, no fevers, chills, no weakness no fatigue HEAD: Normocephalic, atraumatic.  EYES: Pupils equal, round, reactive to light. Extraocular muscles intact. No scleral icterus.  MOUTH: Moist mucosal membrane. Dentition intact. No abscess noted.  EAR, NOSE, THROAT: Clear without exudates. No external lesions.  NECK: Supple. No thyromegaly. No nodules. No JVD.  PULMONARY: mild crackles at bases b/l worse from previous CARDIOVASCULAR: S1 and S2. Regular rate and rhythm. No murmurs, rubs, or gallops. No edema. Pedal pulses 2+ bilaterally.  GASTROINTESTINAL: Soft, nontender, nondistended. No masses. Positive bowel sounds. No hepatosplenomegaly.  MUSCULOSKELETAL: No swelling, clubbing, or edema. Range of motion full in all extremities.  NEUROLOGIC: Cranial nerves II through XII are intact. No gross focal neurological deficits.  Sensation intact. Reflexes intact.  SKIN: No ulceration, lesions, rashes, or cyanosis. Skin warm and dry. Turgor intact.  PSYCHIATRIC: Mood, affect within normal limits. The patient is awake, alert and oriented x 3. Insight, judgment intact.       IMAGING    Dg Chest 2 View  Result Date: 01/21/2019 CLINICAL DATA:  Dyspnea for few weeks with left lower extremity swelling EXAM: CHEST - 2 VIEW COMPARISON:  01/08/2019 chest radiograph. FINDINGS: Intact sternotomy wires. Mitral annuloplasty ring in place. Stable cardiomediastinal silhouette with mild cardiomegaly. No pneumothorax. Small right pleural effusion is increased. No left pleural effusion. Mild pulmonary edema. Patchy right greater than left bibasilar lung opacities worsened on the right. IMPRESSION: 1. Mild congestive heart failure with increased small right pleural effusion. 2. Patchy bibasilar lung opacities, right greater than left, worsened  on the right, favor atelectasis, cannot exclude a component of aspiration or pneumonia. Chest radiograph follow-up advised. Electronically Signed   By: Delbert Phenix M.D.   On: 01/21/2019 17:28   Dg Chest 2 View  Result Date: 01/08/2019 CLINICAL DATA:  Pulmonary embolism. Hx of copd, smoker EXAM: CHEST - 2 VIEW COMPARISON:  Chest radiograph 01/06/2019 FINDINGS: Stable cardiomediastinal contours with enlarged heart size. Surgical changes status post valve replacement. Stable pleuroparenchymal opacity at the right lung base with fluid extending into the minor fissure. Vascular congestion without overt edema. No acute finding in the visualized skeleton. IMPRESSION: Stable pleuroparenchymal opacity at the right lung base with fluid extending into the minor fissure. Electronically Signed   By: Emmaline Kluver M.D.   On: 01/08/2019 08:37   Dg Chest 2 View  Result Date: 01/06/2019 CLINICAL DATA:  Shortness of breath. EXAM: CHEST - 2 VIEW COMPARISON:  Radiograph 12/28/2018 FINDINGS: Post median sternotomy  with prosthetic valve. Similar cardiomegaly and mediastinal contours. Worsening pleuroparenchymal opacity at the right lung base with increasing pleural effusion. Probable fluid in the minor fissure. There is vascular congestion without overt pulmonary edema. No pneumothorax. Unchanged retrocardiac atelectasis. IMPRESSION: 1. Increasing right pleuroparenchymal opacity and pleural effusion over the past 2 days. 2. Cardiomegaly with vascular congestion. Electronically Signed   By: Narda Rutherford M.D.   On: 01/06/2019 20:04   US Renal  Result Date: 02/03/2019 CLINICAL DATA:  Acute renal insufficiency. EXAM: RENAL / URINARY TRACT ULTRASOUND COMPLETE COMPARISON:  None. FINDINGS: Right Kidney: Renal measurements: 11.6 x 5.1 x 5.7 cm = volume: 176 mL. Mild increased cortical echogenicity. No mass or hydronephrosis visualized. Left Kidney: Renal measurements: 12.2 x 5.3 x 5.7 cm = volume: 193 mL. Mild increased cortical echogenicity. No mass or hydronephrosis visualized. Bladder: Appears normal for degree of bladder distention, however not well distended. Other: Small amount of ascites.  Small amount of pleural fluid. IMPRESSION: 1. Normal size kidneys without hydronephrosis. Mild increased cortical echogenicity which can be seen in medical renal disease. 2.  Minimal ascites.  Small amount of pleural fluid. Electronically Signed   By: Elberta Fortis M.D.   On: 02/03/2019 08:13   US Paracentesis  Result Date: 02/02/2019 INDICATION: 58 year old with ascites and abdominal distention. EXAM: ULTRASOUND GUIDED PARACENTESIS MEDICATIONS: None. COMPLICATIONS: None immediate. PROCEDURE: Informed written consent was obtained from the patient after a discussion of the risks, benefits and alternatives to treatment. A timeout was performed prior to the initiation of the procedure. Initial ultrasound scanning demonstrates a small amount of ascites within the right lower abdominal quadrant. The right lower abdomen was prepped  and draped in the usual sterile fashion. 1% lidocaine was used for local anesthesia. Following this, a 19 gauge, 7-cm, Yueh catheter was introduced. An ultrasound image was saved for documentation purposes. The paracentesis was performed. The catheter was removed and a dressing was applied. The patient tolerated the procedure well without immediate post procedural complication. FINDINGS: A total of approximately 650 mL of yellow fluid was removed. Samples were sent to the laboratory as requested by the clinical team. IMPRESSION: Successful ultrasound-guided paracentesis yielding 650 mL of peritoneal fluid. Electronically Signed   By: Richarda Overlie M.D.   On: 02/02/2019 14:37   Dg Chest Portable 1 View  Result Date: 01/30/2019 CLINICAL DATA:  Shortness of breath EXAM: PORTABLE CHEST 1 VIEW COMPARISON:  01/21/2019 FINDINGS: No significant interval change in AP portable examination with small right pleural effusion and associated atelectasis or consolidation. The left lung is normally  aerated. Cardiomegaly status post median sternotomy. IMPRESSION: 1. No significant interval change in AP portable examination with small right pleural effusion and associated atelectasis or consolidation. The left lung is normally aerated. 2.  Cardiomegaly status post median sternotomy. Electronically Signed   By: Lauralyn Primes M.D.   On: 01/30/2019 16:41   Dg Abd 2 Views  Result Date: 01/31/2019 CLINICAL DATA:  Abdominal pain and distension EXAM: ABDOMEN - 2 VIEW COMPARISON:  None. FINDINGS: Nonobstructive pattern of bowel gas with gas present to the rectum. Scattered stool present throughout the colon. No free air in the abdomen. Cardiomegaly in the included lower chest, better assessed by dedicated chest radiograph. 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 IMPRESSION: Nonobstructive pattern of bowel gas with gas present to the rectum. Scattered stool present throughout the colon. No free air  in the abdomen. Electronically Signed   By: Lauralyn Primes M.D.   On: 01/31/2019 17:32   US Abdomen Limited Ruq  Result Date: 02/01/2019 CLINICAL DATA:  Cirrhosis and abdominal distension EXAM: ULTRASOUND ABDOMEN LIMITED RIGHT UPPER QUADRANT COMPARISON:  None. FINDINGS: Gallbladder: No gallstones visualized. The gallbladder wall is diffusely thickened measuring up to 0.7 cm. No sonographic Murphy sign noted by sonographer. Common bile duct: Diameter: 0.4 cm Liver: No focal lesion identified. The liver contour is mildly nodular. Within normal limits in parenchymal echogenicity. Portal vein is patent on color Doppler imaging with normal direction of blood flow towards the liver. Other: Mild right upper quadrant ascites. Small right pleural effusion. IMPRESSION: 1. Mildly nodular contour of the liver consistent with history of cirrhosis. Mild right upper quadrant ascites and small pleural effusion visualized. 2. Gallbladder wall thickening which is a nonspecific finding and can be seen in the setting of gallbladder inflammation, cirrhosis, fluid overload states, hypoalbuminemia, among others. No gallstones visualized. Electronically Signed   By: Emmaline Kluver M.D.   On: 02/01/2019 09:11      ASSESSMENT/PLAN   Acute respiratory distress with mild hypoxemia   -Likely due to fluid overload from continued alcohol abuse and likely liver cirrhosis   -initially improved but now slightly worse then yesterday.    -stopped ativan and remeron - patient states he is no longer dizzy.     Pulmonary arterial hypertension   -Likely group 2 and 3 secondary to cardiac disease and chronic COPD   -Treating underlying cause to optimize cardiac and pulmonary function   -We will need outpatient pulmonary evaluation   Centrilobular emphysema Continue typical COPD care path   Tobacco abuse    -Smoking cessation counseling    -Transdermal nicotine replacement therapy     Thank you Dr. Amado Coe for allowing me to  participate in the care of this patient.   Patient/Family are satisfied with care plan and all questions have been answered.  This document was prepared using Dragon voice recognition software and may include unintentional dictation errors.     Vida Rigger, M.D.  Division of Pulmonary & Critical Care Medicine  [facility]

## 2019-02-04 NOTE — Progress Notes (Signed)
[facility] [place], [place] 02/04/19  Subjective:   Patient c/o cough and some leg edema Did not eat this morning due to oral contrast   Objective:  Vital signs in last 24 hours:  Temp:  [97.5 F (36.4 C)-97.6 F (36.4 C)] 97.6 F (36.4 C) (10/29 0836) Pulse Rate:  [73-79] 73 (10/29 0836) Resp:  [17-19] 17 (10/29 0836) BP: (86-122)/(54-70) 118/68 (10/29 0836) SpO2:  [92 %-99 %] 97 % (10/29 0836) Weight:  [75.9 kg] 75.9 kg (10/29 0424)  Weight change: 3.311 kg Filed Weights   02/02/19 0444 02/03/19 0354 02/04/19 0424  Weight: 71.5 kg 72.6 kg 75.9 kg    Intake/Output:    Intake/Output Summary (Last 24 hours) at 02/04/2019 1151 Last data filed at 02/04/2019 0440 Gross per 24 hour  Intake -  Output 352 ml  Net -352 ml   Physical Exam: General:  No acute distress, laying in the bed  HEENT  anicteric, moist oral mucous membranes  Lungs:  Normal breathing effort, basilar crackles, room air  Heart::  Regular rhythm, prominent systolic murmur  Abdomen:  Soft, nontender  Extremities:  + peripheral edema  Neurologic:  Alert, oriented  Skin:  Multiple tattoos, no acute rashes     Basic Metabolic Panel:  Recent Labs  Lab 02/01/19 0444 02/01/19 2323 02/02/19 0525 02/03/19 0527 02/04/19 0547  NA 138 136 136 135 134*  K 4.4 5.9* 5.3* 5.5* 4.3  CL 104 99 101 101 98  CO2 24 22 24  21* 20*  GLUCOSE 95 91 104* 135* 93  BUN 43* 54* 62* 82* 93*  CREATININE 2.32* 3.17* 3.51* 4.67* 4.91*  CALCIUM 8.5* 8.8* 8.8* 8.3* 8.2*  MG  --  2.2  --   --   --   PHOS  --  7.4*  --   --   --      CBC: Recent Labs  Lab 01/30/19 1600 02/01/19 2323 02/02/19 0525 02/04/19 0547  WBC 7.1 12.3* 10.7* 9.8  NEUTROABS 4.5  --  7.7  --   HGB 12.0* 12.9* 13.1 12.2*  HCT 37.3* 40.5 40.2 36.7*  MCV 80.4 80.7 78.8* 76.9*  PLT 216 207 195 93*      Lab Results  Component Value Date   HEPBSAG NON REACTIVE 02/02/2019      Microbiology:  Recent Results (from  the past 240 hour(s))  SARS CORONAVIRUS 2 (TAT 6-24 HRS) Nasopharyngeal Nasopharyngeal Swab     Status: None   Collection Time: 01/30/19  4:00 PM   Specimen: Nasopharyngeal Swab  Result Value Ref Range Status   SARS Coronavirus 2 NEGATIVE NEGATIVE Final    Comment: (NOTE) SARS-CoV-2 target nucleic acids are NOT DETECTED. The SARS-CoV-2 RNA is generally detectable in upper and lower respiratory specimens during the acute phase of infection. Negative results do not preclude SARS-CoV-2 infection, do not rule out co-infections with other pathogens, and should not be used as the sole basis for treatment or other patient management decisions. Negative results must be combined with clinical observations, patient history, and epidemiological information. The expected result is Negative. Fact Sheet for Patients: HairSlick.nohttps://www.fda.gov/media/138098/download Fact Sheet for Healthcare Providers: quierodirigir.comhttps://www.fda.gov/media/138095/download This test is not yet approved or cleared by the [place] FDA and  has been authorized for detection and/or diagnosis of SARS-CoV-2 by FDA under an Emergency Use Authorization (EUA). This EUA will remain  in effect (meaning this test can be used) for the duration of the COVID-19 declaration under Section 56 4(b)(1) of the Act, 21  U.S.C. section 360bbb-3(b)(1), unless the authorization is terminated or revoked sooner. Performed at [facility], [address], [place], [place] [address]   Body fluid culture     Status: None (Preliminary result)   Collection Time: 02/02/19 11:45 AM   Specimen: PATH Cytology Peritoneal fluid  Result Value Ref Range Status   Specimen Description   Final    PERITONEAL Performed at [facility], [address] [place], [place] [address]    Special Requests   Final    NONE Performed at [facility], [address] [place], [place] [address]    Gram Stain   Final    RARE WBC PRESENT,BOTH PMN AND  MONONUCLEAR NO ORGANISMS SEEN    Culture   Final    NO GROWTH 2 DAYS Performed at [facility], [address], [place], [place] [address]    Report Status PENDING  Incomplete  Acid Fast Smear (AFB)     Status: None   Collection Time: 02/02/19 11:45 AM   Specimen: PATH Cytology Peritoneal fluid  Result Value Ref Range Status   AFB Specimen Processing Concentration  Final   Acid Fast Smear Negative  Final    Comment: (NOTE) Performed At: [facility] [address] [place], [place] [id number] Jolene Schimke MD MV:[id number]    Source (AFB) PERITONEAL  Final    Comment: Performed at [facility], [address] [place], [place] [address]  Culture, blood (routine x 2)     Status: None (Preliminary result)   Collection Time: 02/03/19  3:05 PM   Specimen: BLOOD  Result Value Ref Range Status   Specimen Description BLOOD BOTTLES DRAWN AEROBIC AND ANAEROBIC  Final   Special Requests Blood Culture adequate volume BLOOD LEFT HAND  Final   Culture   Final    NO GROWTH < 12 HOURS Performed at [facility], [address], [place], [place] [address]    Report Status PENDING  Incomplete  Culture, blood (routine x 2)     Status: None (Preliminary result)   Collection Time: 02/03/19  3:16 PM   Specimen: BLOOD  Result Value Ref Range Status   Specimen Description BLOOD Blood Culture adequate volume  Final   Special Requests   Final    BOTTLES DRAWN AEROBIC AND ANAEROBIC BLOOD RIGHT HAND   Culture   Final    NO GROWTH < 12 HOURS Performed at [facility], [address] [place], [place] [address]    Report Status PENDING  Incomplete    Coagulation Studies: Recent Labs    02/02/19 0525 02/03/19 1003 02/04/19 0547  LABPROT 24.9* 33.4* 32.5*  INR 2.3* 3.3* 3.2*    Urinalysis: No results for input(s): COLORURINE, LABSPEC, PHURINE, GLUCOSEU, HGBUR, BILIRUBINUR, KETONESUR, PROTEINUR, UROBILINOGEN, NITRITE, LEUKOCYTESUR in the last 72  hours.  Invalid input(s): APPERANCEUR    Imaging: Ct Abdomen Pelvis Wo Contrast  Result Date: 02/04/2019 CLINICAL DATA:  Abdominal distention EXAM: CT ABDOMEN AND PELVIS WITHOUT CONTRAST TECHNIQUE: Multidetector CT imaging of the abdomen and pelvis was performed following the standard protocol without IV contrast. COMPARISON:  None. FINDINGS: Lower chest: Small or moderate right pleural effusion with atelectasis. Postoperative cardiac valves, incompletely covered. Hepatobiliary: History of cirrhosis. No focal liver abnormality.No evidence of biliary obstruction or stone. Pancreas: Unremarkable. Spleen: Unremarkable. Adrenals/Urinary Tract: Negative adrenals. No hydronephrosis or ureteral stone. Tiny left upper pole calculus. Unremarkable bladder. Stomach/Bowel: No obstruction. No evidence of bowel inflammation. Mild left colonic diverticulosis.  Vascular/Lymphatic: No acute vascular abnormality. Extensive atherosclerotic calcification no mass or adenopathy. Reproductive:Negative. Other: Small to moderate ascites without loculation. No pneumoperitoneum. Anasarca Musculoskeletal: Advanced lumbar spine degeneration. Severe left hip osteoarthritis with bone-on-bone contact and eccentric subchondral geode formation. 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 IMPRESSION: 1. Small to moderate ascites and right pleural effusion with anasarca. No other explanation for abdominal distention, no bowel obstruction 2. Aortic Atherosclerosis (ICD10-I70.0) and other chronic findings are described above. Electronically Signed   By: Marnee Spring M.D.   On: 02/04/2019 10:50   US Renal  Result Date: 02/03/2019 CLINICAL DATA:  Acute renal insufficiency. EXAM: RENAL / URINARY TRACT ULTRASOUND COMPLETE COMPARISON:  None. FINDINGS: Right Kidney: Renal measurements: 11.6 x 5.1 x 5.7 cm = volume: 176 mL. Mild increased cortical echogenicity. No mass or hydronephrosis visualized. Left Kidney: Renal  measurements: 12.2 x 5.3 x 5.7 cm = volume: 193 mL. Mild increased cortical echogenicity. No mass or hydronephrosis visualized. Bladder: Appears normal for degree of bladder distention, however not well distended. Other: Small amount of ascites.  Small amount of pleural fluid. IMPRESSION: 1. Normal size kidneys without hydronephrosis. Mild increased cortical echogenicity which can be seen in medical renal disease. 2.  Minimal ascites.  Small amount of pleural fluid. Electronically Signed   By: Elberta Fortis M.D.   On: 02/03/2019 08:13   US Paracentesis  Result Date: 02/02/2019 INDICATION: 58 year old with ascites and abdominal distention. EXAM: ULTRASOUND GUIDED PARACENTESIS MEDICATIONS: None. COMPLICATIONS: None immediate. PROCEDURE: Informed written consent was obtained from the patient after a discussion of the risks, benefits and alternatives to treatment. A timeout was performed prior to the initiation of the procedure. Initial ultrasound scanning demonstrates a small amount of ascites within the right lower abdominal quadrant. The right lower abdomen was prepped and draped in the usual sterile fashion. 1% lidocaine was used for local anesthesia. Following this, a 19 gauge, 7-cm, Yueh catheter was introduced. An ultrasound image was saved for documentation purposes. The paracentesis was performed. The catheter was removed and a dressing was applied. The patient tolerated the procedure well without immediate post procedural complication. FINDINGS: A total of approximately 650 mL of yellow fluid was removed. Samples were sent to the laboratory as requested by the clinical team. IMPRESSION: Successful ultrasound-guided paracentesis yielding 650 mL of peritoneal fluid. Electronically Signed   By: Richarda Overlie M.D.   On: 02/02/2019 14:37     Medications:   . sodium chloride    . acetylcysteine 15 mg/kg/hr (02/04/19 0233)   . feeding supplement  1 Container Oral TID BM  . fluticasone  2 spray Each Nare  Daily  . folic acid  1 mg Oral Daily  . lactulose  20 g Oral Daily  . mouth rinse  15 mL Mouth Rinse BID  . midodrine  5 mg Oral TID WC  . multivitamin with minerals  1 tablet Oral Daily  . phytonadione  10 mg Subcutaneous Daily  . pneumococcal 23 valent vaccine  0.5 mL Intramuscular Tomorrow-1000  . polyethylene glycol  17 g Oral Daily  . sodium chloride flush  3 mL Intravenous Q12H  . thiamine  100 mg Oral Daily   Or  . thiamine  100 mg Intravenous Daily   sodium chloride, bisacodyl, ipratropium-albuterol, ondansetron (ZOFRAN) IV, sodium chloride, sodium chloride flush  Assessment/ Plan:  58 y.o.African American male  with history of bipolar disorder, diabetes, hypertension, COPD, aortic valve replacement on 1990 and aortic and mitral valve repair  in 2000 at [facility], history of hepatitis C treated with Harvoni at [facility] 4 years ago,   was admitted on 01/30/2019 with worsening edema and ascites.   Active problems: Cirrhosis, pulmonary hypertension,moderate mitral regurgitation, calcification of mitral valve, dilated right atrium and left atrium,With EF of 50 to 55% and dilated cardiomyopathy Moderately reduced LV function, diastolic dysfunction, acute hepatitis   # Acute kidney injury -Differential diagnosis includes cardiorenal syndrome, prerenal and ATN leading to AKI from aggressive diuresis -Avoid hypotension.  Start midodrine 5 mg 3 times daily -Avoid nephrotoxins, NSAIDs, IV contrast - mild fluid overload today - Hold further iv fluid supplementation -Discussed possibility of needing dialysis if renal function does not recover.  No acute indication at present.  Conservative treatment for now  # Hyperkalemia -Low-dose daily Lokelma -Follow low-salt diet - in normal range today  # GI - Cirrhosis, alcoholic, Acute hepatitis , coagulopathy - Reports that he was told not to drink alcohol after hepatitis C treatment but he started drinking beer anyway.  Currently he reports drinking  2 - 40 ounce beers daily and liquor occasionally. -Advised patient to quit alcohol -management as per GI team    LOS: [place] 10/29/202011:51 AM  [place], [place]  Note: This note was prepared with Dragon dictation. Any transcription errors are unintentional

## 2019-02-04 NOTE — Progress Notes (Signed)
Patient ID: [name] [name], male   DOB: 04/07/1961, 58 y.o.   MRN: [id number]  [facility] PROGRESS NOTE  [name] [name]:[id number] DOB: 12/08/1960 DOA: 01/30/2019 PCP: Medicine, [name]  HPI/Subjective: No new c/o, wants to eat, UDS + for cocaine Objective: Vitals:   02/04/19 1717 02/04/19 2003  BP: 129/71 119/76  Pulse: 72 77  Resp: 19 18  Temp: (!) 97.5 F (36.4 C) 97.7 F (36.5 C)  SpO2: 98% 98%    Intake/Output Summary (Last 24 hours) at 02/04/2019 2020 Last data filed at 02/04/2019 1622 Gross per 24 hour  Intake -  Output 1002 ml  Net -1002 ml   Filed Weights   02/02/19 0444 02/03/19 0354 02/04/19 0424  Weight: 71.5 kg 72.6 kg 75.9 kg    ROS: Review Dawson Systems  Constitutional: Negative for chills and fever.  Eyes: Negative for blurred vision.  Respiratory: Positive for cough and shortness Dawson breath.   Cardiovascular: Negative for chest pain.  Gastrointestinal: Positive for abdominal pain. Negative for constipation, diarrhea, nausea and vomiting.  Genitourinary: Negative for dysuria.  Musculoskeletal: Negative for joint pain.  Neurological: Negative for dizziness and headaches.   Exam: Physical Exam  Constitutional: He is oriented to person, place, and time.  HENT:  Nose: No mucosal edema.  Mouth/Throat: No oropharyngeal exudate or posterior oropharyngeal edema.  Eyes: Pupils are equal, round, and reactive to light. Conjunctivae, EOM and lids are normal.  Neck: No JVD present. Carotid bruit is not present. No edema present. No thyroid mass and no thyromegaly present.  Cardiovascular: S1 normal and S2 normal. Exam reveals no gallop.  Murmur heard.  Systolic murmur is present with a grade Dawson 3/6. Respiratory: No respiratory distress. He has decreased breath sounds in the right lower field and the left lower field. He has no wheezes. He has no rhonchi. He has rales in the right lower field and the left lower field.  GI: Soft. Bowel sounds are  normal. He exhibits distension. There is abdominal tenderness.  Musculoskeletal:     Right ankle: He exhibits swelling.     Left ankle: He exhibits swelling.  Lymphadenopathy:    He has no cervical adenopathy.  Neurological: He is alert and oriented to person, place, and time. No cranial nerve deficit.  Skin: Skin is warm. No rash noted. Nails show no clubbing.  Psychiatric: He has a normal mood and affect.      Data Reviewed: Basic Metabolic Panel: Recent Labs  Lab 02/01/19 0444 02/01/19 2323 02/02/19 0525 02/03/19 0527 02/04/19 0547  NA 138 136 136 135 134*  K 4.4 5.9* 5.3* 5.5* 4.3  CL 104 99 101 101 98  CO2 24 22 24  21* 20*  GLUCOSE 95 91 104* 135* 93  BUN 43* 54* 62* 82* 93*  CREATININE 2.32* 3.17* 3.51* 4.67* 4.91*  CALCIUM 8.5* 8.8* 8.8* 8.3* 8.2*  MG  --  2.2  --   --   --   PHOS  --  7.4*  --   --   --    Liver Function Tests: Recent Labs  Lab 01/30/19 1600 02/01/19 2323 02/02/19 0525 02/03/19 0527 02/04/19 0547  AST 90* 531* 1,318* 2,174* 1,758*  ALT 64* 274* 624* 923* 1,037*  ALKPHOS 121 158* 149* 130* 132*  BILITOT 1.7* 2.3* 2.2* 1.9* 1.6*  PROT 6.9 6.5 6.6 6.3* 6.3*  ALBUMIN 3.5 3.2* 3.1* 3.2* 3.0*   CBC: Recent Labs  Lab 01/30/19 1600 02/01/19 2323 02/02/19 0525 02/04/19 0547  WBC 7.1 12.3* 10.7* 9.8  NEUTROABS 4.5  --  7.7  --   HGB 12.0* 12.9* 13.1 12.2*  HCT 37.3* 40.5 40.2 36.7*  MCV 80.4 80.7 78.8* 76.9*  PLT 216 207 195 93*   BNP (last 3 results) Recent Labs    01/06/19 1922 01/30/19 1600  BNP 1,112.0* 1,234.0*      Recent Results (from the past 240 hour(s))  SARS CORONAVIRUS 2 (TAT 6-24 HRS) Nasopharyngeal Nasopharyngeal Swab     Status: None   Collection Time: 01/30/19  4:00 PM   Specimen: Nasopharyngeal Swab  Result Value Ref Range Status   SARS Coronavirus 2 NEGATIVE NEGATIVE Final    Comment: (NOTE) SARS-CoV-2 target nucleic acids are NOT DETECTED. The SARS-CoV-2 RNA is generally detectable in upper and  lower respiratory specimens during the acute phase Dawson infection. Negative results do not preclude SARS-CoV-2 infection, do not rule out co-infections with other pathogens, and should not be used as the sole basis for treatment or other patient management decisions. Negative results must be combined with clinical observations, patient history, and epidemiological information. The expected result is Negative. Fact Sheet for Patients: SugarRoll.be Fact Sheet for Healthcare Providers: https://www.woods-mathews.com/ This test is not yet approved or cleared by the [place] FDA and  has been authorized for detection and/or diagnosis Dawson SARS-CoV-2 by FDA under an Emergency Use Authorization (EUA). This EUA will remain  in effect (meaning this test can be used) for the duration Dawson the COVID-19 declaration under Section 56 4(b)(1) Dawson the Act, 21 U.S.C. section 360bbb-3(b)(1), unless the authorization is terminated or revoked sooner. Performed at [facility], [place] [address], [place], [place] [address]   Body fluid culture     Status: None (Preliminary result)   Collection Time: 02/02/19 11:45 AM   Specimen: PATH Cytology Peritoneal fluid  Result Value Ref Range Status   Specimen Description   Final    PERITONEAL Performed at [facility], [address], [place], [place] [address]    Special Requests   Final    NONE Performed at [facility], [address] [place], [place] [address]    Gram Stain   Final    RARE WBC PRESENT,BOTH PMN AND MONONUCLEAR NO ORGANISMS SEEN    Culture   Final    NO GROWTH 2 DAYS Performed at [facility], [place] [address], [place], [place] [address]    Report Status PENDING  Incomplete  Acid Fast Smear (AFB)     Status: None   Collection Time: 02/02/19 11:45 AM   Specimen: PATH Cytology Peritoneal fluid  Result Value Ref Range Status   AFB Specimen Processing Concentration   Final   Acid Fast Smear Negative  Final    Comment: (NOTE) Performed At: [facility] [address] [place], [place] [id number] Rush Farmer MD [id number]    Source (AFB) PERITONEAL  Final    Comment: Performed at [facility], [address] [place], [place] [address]  Culture, blood (routine x 2)     Status: None (Preliminary result)   Collection Time: 02/03/19  3:05 PM   Specimen: BLOOD  Result Value Ref Range Status   Specimen Description BLOOD BOTTLES DRAWN AEROBIC AND ANAEROBIC  Final   Special Requests Blood Culture adequate volume BLOOD LEFT HAND  Final   Culture   Final    NO GROWTH < 12 HOURS Performed at [facility], [address], [place],  [address]  Report Status PENDING  Incomplete  Culture, blood (routine x 2)     Status: None (Preliminary result)   Collection Time: 02/03/19  3:16 PM   Specimen: BLOOD  Result Value Ref Range Status   Specimen Description BLOOD Blood Culture adequate volume  Final   Special Requests   Final    BOTTLES DRAWN AEROBIC AND ANAEROBIC BLOOD RIGHT HAND   Culture   Final    NO GROWTH < 12 HOURS Performed at [facility], [address] [place], [place] [address]    Report Status PENDING  Incomplete     Studies: Ct Abdomen Pelvis Wo Contrast  Result Date: 02/04/2019 CLINICAL DATA:  Abdominal distention EXAM: CT ABDOMEN AND PELVIS WITHOUT CONTRAST TECHNIQUE: Multidetector CT imaging Dawson the abdomen and pelvis was performed following the standard protocol without IV contrast. COMPARISON:  None. FINDINGS: Lower chest: Small or moderate right pleural effusion with atelectasis. Postoperative cardiac valves, incompletely covered. Hepatobiliary: History Dawson cirrhosis. No focal liver abnormality.No evidence Dawson biliary obstruction or stone. Pancreas: Unremarkable. Spleen: Unremarkable. Adrenals/Urinary Tract: Negative adrenals. No hydronephrosis or ureteral stone. Tiny left upper pole calculus.  Unremarkable bladder. Stomach/Bowel: No obstruction. No evidence Dawson bowel inflammation. Mild left colonic diverticulosis. Vascular/Lymphatic: No acute vascular abnormality. Extensive atherosclerotic calcification no mass or adenopathy. Reproductive:Negative. Other: Small to moderate ascites without loculation. No pneumoperitoneum. Anasarca Musculoskeletal: Advanced lumbar spine degeneration. Severe left hip osteoarthritis with bone-on-bone contact and eccentric subchondral geode formation. This is likely sequela Dawson avascular necrosis Dawson the femoral head. IMPRESSION: 1. Small to moderate ascites and right pleural effusion with anasarca. No other explanation for abdominal distention, no bowel obstruction 2. Aortic Atherosclerosis (ICD10-I70.0) and other chronic findings are described above. Electronically Signed   By: Marnee Spring M.D.   On: 02/04/2019 10:50   US Renal  Result Date: 02/03/2019 CLINICAL DATA:  Acute renal insufficiency. EXAM: RENAL / URINARY TRACT ULTRASOUND COMPLETE COMPARISON:  None. FINDINGS: Right Kidney: Renal measurements: 11.6 x 5.1 x 5.7 cm = volume: 176 mL. Mild increased cortical echogenicity. No mass or hydronephrosis visualized. Left Kidney: Renal measurements: 12.2 x 5.3 x 5.7 cm = volume: 193 mL. Mild increased cortical echogenicity. No mass or hydronephrosis visualized. Bladder: Appears normal for degree Dawson bladder distention, however not well distended. Other: Small amount Dawson ascites.  Small amount Dawson pleural fluid. IMPRESSION: 1. Normal size kidneys without hydronephrosis. Mild increased cortical echogenicity which can be seen in medical renal disease. 2.  Minimal ascites.  Small amount Dawson pleural fluid. Electronically Signed   By: Elberta Fortis M.D.   On: 02/03/2019 08:13    Scheduled Meds: . feeding supplement  1 Container Oral TID BM  . fluticasone  2 spray Each Nare Daily  . folic acid  1 mg Oral Daily  . lactulose  20 g Oral Daily  . mouth rinse  15 mL Mouth Rinse  BID  . midodrine  5 mg Oral TID WC  . multivitamin with minerals  1 tablet Oral Daily  . phytonadione  10 mg Subcutaneous Daily  . pneumococcal 23 valent vaccine  0.5 mL Intramuscular Tomorrow-1000  . polyethylene glycol  17 g Oral Daily  . sodium chloride flush  3 mL Intravenous Q12H  . thiamine  100 mg Oral Daily   Or  . thiamine  100 mg Intravenous Daily   Continuous Infusions: . sodium chloride    . acetylcysteine 15 mg/kg/hr (02/04/19 0233)    Assessment/Plan:  1. Acute diastolic congestive heart failure with moderate  mitral regurgitation and severe tricuspid regurgitation. Stopping Diuretics with worsening rena function. Starting IVF per nephro 2. Acute hypoxic respiratory failure.  Try to wean off oxygen.  Patient does not wear oxygen at home. 3. Pulmonary hypertension seen on echocardiogram.  Pulmonology input appreciated 4. 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  Ultrasound with the cholecystitis with gallbladder wall thickening but no gallstones but patient is reporting abdominal distention .s/p ultrasound-guided paracentesis for symptomatic relief - no SBP. GI requested CT which confirms ascites but no other patho 5. Hyperlipidemia unspecified on Lipitor 6. Acute on CKD 2: worsening renal function with decrease UOP & Hyperkalemia - could be hepatorenal syndrome. Will c/s Nephro 7.  Acute hepatic failure - thought to be due to cocaine induced ischemia (UDS +) with underlying alcoholic hepatitis & cirrhosis, Ultrasound Doppler did not reveal portal vein thrombosis Continue N-acetylcysteine until tomorrow per GI, Acute viral hepatitis panel negative  Continue lactulose 20 g daily to prevent hepatic encephalopathy  8. Acute renal failure - Appreciate  input  8. Constipation with scattered stool throughout the colon we will put him on stool softeners and laxatives Code Status:     Code Status Orders  (From admission, onward)         Start     Ordered    01/30/19 2200  Full code  Continuous     01/30/19 2203        Code Status History    Date Active Date Inactive Code Status Order ID Comments User Context   01/07/2019 0231 01/08/2019 1755 Full Code [id number]  Oralia Manis, MD Inpatient   08/07/2011 1543 08/08/2011 1513 Full Code [id number]  Glynn Octave, MD ED   Advance Care Planning Activity     Family Communication: Patient did not want me to speak with family. Disposition Plan: To be determined  Consultants:  Cardiology  Time spent: 35  minutes  Remmy Crass [employer]

## 2019-02-04 NOTE — Progress Notes (Signed)
*  PRELIMINARY RESULTS* Echocardiogram 2D Echocardiogram has been performed.  Sherrie Sport 02/04/2019, 1:46 PM

## 2019-02-04 NOTE — Progress Notes (Signed)
[name], MD [address]  [address]  [place], [place] [address]  Main: [phone]  Fax: [phone] Pager: [phone]   Subjective: Patient reports decreased appetite.  He denies abdominal pain, nausea or vomiting.  Making more urine compared to yesterday.   Objective: Vital signs in last 24 hours: Vitals:   02/04/19 0424 02/04/19 0800 02/04/19 0836 02/04/19 1717  BP: 122/69  118/68 129/71  Pulse: 78  73 72  Resp:   17 19  Temp: 97.6 F (36.4 C)  97.6 F (36.4 C) (!) 97.5 F (36.4 C)  TempSrc: Oral  Oral Oral  SpO2: 92% 95% 97% 98%  Weight: 75.9 kg     Height:       Weight change: 3.311 kg  Intake/Output Summary (Last 24 hours) at 02/04/2019 1737 Last data filed at 02/04/2019 1622 Gross per 24 hour  Intake -  Output 1002 ml  Net -1002 ml     Exam: Heart:: Regular rate and rhythm, S1S2 present or without murmur or extra heart sounds Lungs: normal and clear to auscultation Abdomen: soft, nontender, normal bowel sounds   Lab Results: CBC Latest Ref Rng & Units 02/04/2019 02/02/2019 02/01/2019  WBC 4.0 - 10.5 K/uL 9.8 10.7(H) 12.3(H)  Hemoglobin 13.0 - 17.0 g/dL 12.2(L) 13.1 12.9(L)  Hematocrit 39.0 - 52.0 % 36.7(L) 40.2 40.5  Platelets 150 - 400 K/uL 93(L) 195 207   CMP Latest Ref Rng & Units 02/04/2019 02/03/2019 02/02/2019  Glucose 70 - 99 mg/dL 93 962(X) 528(U)  BUN 6 - 20 mg/dL 13(K) 44(W) 10(U)  Creatinine 0.61 - 1.24 mg/dL 7.25(D) 6.64(Q) 0.34(V)  Sodium 135 - 145 mmol/L 134(L) 135 136  Potassium 3.5 - 5.1 mmol/L 4.3 5.5(H) 5.3(H)  Chloride 98 - 111 mmol/L 98 101 101  CO2 22 - 32 mmol/L 20(L) 21(L) 24  Calcium 8.9 - 10.3 mg/dL 8.2(L) 8.3(L) 8.8(L)  Total Protein 6.5 - 8.1 g/dL 6.3(L) 6.3(L) 6.6  Total Bilirubin 0.3 - 1.2 mg/dL 4.2(V) 1.9(H) 2.2(H)  Alkaline Phos 38 - 126 U/L 132(H) 130(H) 149(H)  AST 15 - 41 U/L 1,758(H) 2,174(H) 1,318(H)  ALT 0 - 44 U/L 1,037(H) 923(H) 624(H)    Micro Results: Recent Results (from the  past 240 hour(s))  SARS CORONAVIRUS 2 (TAT 6-24 HRS) Nasopharyngeal Nasopharyngeal Swab     Status: None   Collection Time: 01/30/19  4:00 PM   Specimen: Nasopharyngeal Swab  Result Value Ref Range Status   SARS Coronavirus 2 NEGATIVE NEGATIVE Final    Comment: (NOTE) SARS-CoV-2 target nucleic acids are NOT DETECTED. The SARS-CoV-2 RNA is generally detectable in upper and lower respiratory specimens during the acute phase of infection. Negative results do not preclude SARS-CoV-2 infection, do not rule out co-infections with other pathogens, and should not be used as the sole basis for treatment or other patient management decisions. Negative results must be combined with clinical observations, patient history, and epidemiological information. The expected result is Negative. Fact Sheet for Patients: HairSlick.no Fact Sheet for Healthcare Providers: quierodirigir.com This test is not yet approved or cleared by the [place] FDA and  has been authorized for detection and/or diagnosis of SARS-CoV-2 by FDA under an Emergency Use Authorization (EUA). This EUA will remain  in effect (meaning this test can be used) for the duration of the COVID-19 declaration under Section 56 4(b)(1) of the Act, 21 U.S.C. section 360bbb-3(b)(1), unless the authorization is terminated or revoked sooner. Performed at [facility], [address]  [place], [place] [address]   Body fluid culture     Status: None (Preliminary result)   Collection Time: 02/02/19 11:45 AM   Specimen: PATH Cytology Peritoneal fluid  Result Value Ref Range Status   Specimen Description   Final    PERITONEAL Performed at [facility], [address] [place], [place] [address]    Special Requests   Final    NONE Performed at [facility], [address] [place], [place] [address]    Gram Stain   Final    RARE WBC PRESENT,BOTH PMN AND  MONONUCLEAR NO ORGANISMS SEEN    Culture   Final    NO GROWTH 2 DAYS Performed at [facility], [address] [place], [place] [address]    Report Status PENDING  Incomplete  Acid Fast Smear (AFB)     Status: None   Collection Time: 02/02/19 11:45 AM   Specimen: PATH Cytology Peritoneal fluid  Result Value Ref Range Status   AFB Specimen Processing Concentration  Final   Acid Fast Smear Negative  Final    Comment: (NOTE) Performed At: [facility] [place] [address] [place], [place] [id number] Jolene SchimkeNagendra Sanjai MD MV:[phone]:[phone]    Source (AFB) PERITONEAL  Final    Comment: Performed at [facility], [address] [place], [place] [address]  Culture, blood (routine x 2)     Status: None (Preliminary result)   Collection Time: 02/03/19  3:05 PM   Specimen: BLOOD  Result Value Ref Range Status   Specimen Description BLOOD BOTTLES DRAWN AEROBIC AND ANAEROBIC  Final   Special Requests Blood Culture adequate volume BLOOD LEFT HAND  Final   Culture   Final    NO GROWTH < 12 HOURS Performed at [facility], [address] [place], [place] [address]    Report Status PENDING  Incomplete  Culture, blood (routine x 2)     Status: None (Preliminary result)   Collection Time: 02/03/19  3:16 PM   Specimen: BLOOD  Result Value Ref Range Status   Specimen Description BLOOD Blood Culture adequate volume  Final   Special Requests   Final    BOTTLES DRAWN AEROBIC AND ANAEROBIC BLOOD RIGHT HAND   Culture   Final    NO GROWTH < 12 HOURS Performed at [facility], [address] [place], [place] [address]    Report Status PENDING  Incomplete   Studies/Results: Ct Abdomen Pelvis Wo Contrast  Result Date: 02/04/2019 CLINICAL DATA:  Abdominal distention EXAM: CT ABDOMEN AND PELVIS WITHOUT CONTRAST TECHNIQUE: Multidetector CT imaging of the abdomen and pelvis was performed following the standard protocol without IV contrast. COMPARISON:  None.  FINDINGS: Lower chest: Small or moderate right pleural effusion with atelectasis. Postoperative cardiac valves, incompletely covered. Hepatobiliary: History of cirrhosis. No focal liver abnormality.No evidence of biliary obstruction or stone. Pancreas: Unremarkable. Spleen: Unremarkable. Adrenals/Urinary Tract: Negative adrenals. No hydronephrosis or ureteral stone. Tiny left upper pole calculus. Unremarkable bladder. Stomach/Bowel: No obstruction. No evidence of bowel inflammation. Mild left colonic diverticulosis. Vascular/Lymphatic: No acute vascular abnormality. Extensive atherosclerotic calcification no mass or adenopathy. Reproductive:Negative. Other: Small to moderate ascites without loculation. No pneumoperitoneum. Anasarca Musculoskeletal: Advanced lumbar spine degeneration. Severe left hip osteoarthritis with bone-on-bone contact and eccentric subchondral geode formation. 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 IMPRESSION: 1. Small to moderate ascites and right pleural effusion with anasarca. No other explanation for abdominal distention, no bowel obstruction 2. Aortic Atherosclerosis (ICD10-I70.0)  and other chronic findings are described above. Electronically Signed   By: Monte Fantasia M.D.   On: 02/04/2019 10:50   US Renal  Result Date: 02/03/2019 CLINICAL DATA:  Acute renal insufficiency. EXAM: RENAL / URINARY TRACT ULTRASOUND COMPLETE COMPARISON:  None. FINDINGS: Right Kidney: Renal measurements: 11.6 x 5.1 x 5.7 cm = volume: 176 mL. Mild increased cortical echogenicity. No mass or hydronephrosis visualized. Left Kidney: Renal measurements: 12.2 x 5.3 x 5.7 cm = volume: 193 mL. Mild increased cortical echogenicity. No mass or hydronephrosis visualized. Bladder: Appears normal for degree of bladder distention, however not well distended. Other: Small amount of ascites.  Small amount of pleural fluid. IMPRESSION: 1. Normal size kidneys without hydronephrosis. Mild  increased cortical echogenicity which can be seen in medical renal disease. 2.  Minimal ascites.  Small amount of pleural fluid. Electronically Signed   By: Marin Olp M.D.   On: 02/03/2019 08:13   Medications:  I have reviewed the patient's current medications. Prior to Admission:  Medications Prior to Admission  Medication Sig Dispense Refill Last Dose  . atorvastatin (LIPITOR) 40 MG tablet Take 40 mg by mouth daily.   Past Week at Unknown time  . azithromycin (ZITHROMAX) 250 MG tablet Take as directed on package. 6 each 0 01/30/2019 at Unknown time  . FLUoxetine (PROZAC) 20 MG capsule Take 20 mg by mouth every morning.   Past Week at Unknown time  . furosemide (LASIX) 40 MG tablet Take 1 tablet (40 mg total) by mouth daily. 30 tablet 5 Past Week at Unknown time  . gabapentin (NEURONTIN) 300 MG capsule Take 300 mg by mouth 3 (three) times daily.   Past Week at Unknown time  . ipratropium (ATROVENT HFA) 17 MCG/ACT inhaler Inhale 2 puffs into the lungs 4 (four) times daily.   Past Week at Unknown time  . meloxicam (MOBIC) 15 MG tablet Take 15 mg by mouth daily.   Past Week at Unknown time  . metoprolol tartrate (LOPRESSOR) 25 MG tablet Take 25 mg by mouth daily.   Past Week at Unknown time  . mirtazapine (REMERON) 15 MG tablet Take 15 mg by mouth at bedtime.   Past Week at Unknown time  . potassium chloride 20 MEQ TBCR Take 20 mEq by mouth daily. 30 tablet 5 Past Week at Unknown time  . acetaminophen (TYLENOL) 500 MG tablet Take 1,000 mg by mouth every 8 (eight) hours.   prn at prn  . albuterol (VENTOLIN HFA) 108 (90 Base) MCG/ACT inhaler Inhale 2 puffs into the lungs every 4 (four) hours as needed for wheezing or shortness of breath. 6.7 g 0 prn at prn  . aspirin 325 MG tablet Take 325 mg by mouth daily.   Not Taking at Unknown time  . DULoxetine (CYMBALTA) 30 MG capsule Take 30 mg by mouth daily.   Not Taking at Unknown time  . naproxen (NAPROSYN) 500 MG tablet Take 500 mg by mouth 2 (two)  times daily with a meal.   Completed Course at Unknown time   Scheduled: . feeding supplement  1 Container Oral TID BM  . fluticasone  2 spray Each Nare Daily  . folic acid  1 mg Oral Daily  . lactulose  20 g Oral Daily  . mouth rinse  15 mL Mouth Rinse BID  . midodrine  5 mg Oral TID WC  . multivitamin with minerals  1 tablet Oral Daily  . phytonadione  10 mg Subcutaneous Daily  . pneumococcal 23  valent vaccine  0.5 mL Intramuscular Tomorrow-1000  . polyethylene glycol  17 g Oral Daily  . sodium chloride flush  3 mL Intravenous Q12H  . thiamine  100 mg Oral Daily   Or  . thiamine  100 mg Intravenous Daily   Continuous: . sodium chloride    . acetylcysteine 15 mg/kg/hr (02/04/19 0233)   RRN:HAFBXU chloride, bisacodyl, ipratropium-albuterol, ondansetron (ZOFRAN) IV, sodium chloride, sodium chloride flush Anti-infectives (From admission, onward)   Start     Dose/Rate Route Frequency Ordered Stop   01/31/19 0030  azithromycin (ZITHROMAX) tablet 500 mg  Status:  Discontinued     500 mg Oral Daily 01/31/19 0017 01/31/19 1506     Scheduled Meds: . feeding supplement  1 Container Oral TID BM  . fluticasone  2 spray Each Nare Daily  . folic acid  1 mg Oral Daily  . lactulose  20 g Oral Daily  . mouth rinse  15 mL Mouth Rinse BID  . midodrine  5 mg Oral TID WC  . multivitamin with minerals  1 tablet Oral Daily  . phytonadione  10 mg Subcutaneous Daily  . pneumococcal 23 valent vaccine  0.5 mL Intramuscular Tomorrow-1000  . polyethylene glycol  17 g Oral Daily  . sodium chloride flush  3 mL Intravenous Q12H  . thiamine  100 mg Oral Daily   Or  . thiamine  100 mg Intravenous Daily   Continuous Infusions: . sodium chloride    . acetylcysteine 15 mg/kg/hr (02/04/19 0233)   PRN Meds:.sodium chloride, bisacodyl, ipratropium-albuterol, ondansetron (ZOFRAN) IV, sodium chloride, sodium chloride flush   Assessment: Active Problems:   CHF (congestive heart failure) (HCC)  Acute  liver failure Acute kidney injury  Plan: Acute liver failure, history of heavy alcohol use, drug abuse Urine drug screen came back positive for cocaine today Likely combination of ischemic liver injury, alcoholic hepatitis and DILI in background of cirrhosis Ultrasound Doppler did not reveal portal vein thrombosis Continue N-acetylcysteine until tomorrow Acute viral hepatitis panel negative, Hepatitis B E antigen negative, HSV IgM pending, CMV IgM negative, EBV IgM titers in process HCV antibody positive, HCV PCR in process Adequate p.o. nutrition, high-protein diet  Continue vitamin K subcutaneous 10 mg daily for 3 days, day 3 Check LFTs, PT/INR daily Continue lactulose 20 g daily to prevent hepatic encephalopathy Monitor for any neurologic symptoms closely  Ascites No evidence of SBP AFB smear negative, culture in process Cytology pending  Acute renal failure Worsening creatinine, oliguria IV fluids held due to concern for volume overload Midodrine is initiated by nephrology    LOS: 5 days   Rohini [name] 02/04/2019, 5:37 PM

## 2019-02-05 ENCOUNTER — Inpatient Hospital Stay: Payer: Medicaid Other

## 2019-02-05 DIAGNOSIS — K7200 Acute and subacute hepatic failure without coma: Secondary | ICD-10-CM | POA: Diagnosis not present

## 2019-02-05 LAB — HSV 1 AND 2 IGM ABS, INDIRECT
HSV 1 IgM Antibodies: <1:10 {titer}
HSV 2 IgM Antibodies: <1:10 {titer}

## 2019-02-05 LAB — BODY FLUID CULTURE: Culture: NO GROWTH

## 2019-02-05 LAB — COMPREHENSIVE METABOLIC PANEL
ALT: 845 U/L — ABNORMAL HIGH (ref 0–44)
AST: 1057 U/L — ABNORMAL HIGH (ref 15–41)
Albumin: 3.1 g/dL — ABNORMAL LOW (ref 3.5–5.0)
Alkaline Phosphatase: 130 U/L — ABNORMAL HIGH (ref 38–126)
Anion gap: 14 (ref 5–15)
BUN: 82 mg/dL — ABNORMAL HIGH (ref 6–20)
CO2: 20 mmol/L — ABNORMAL LOW (ref 22–32)
Calcium: 8.8 mg/dL — ABNORMAL LOW (ref 8.9–10.3)
Chloride: 100 mmol/L (ref 98–111)
Creatinine, Ser: 3.15 mg/dL — ABNORMAL HIGH (ref 0.61–1.24)
GFR calc Af Amer: 24 mL/min — ABNORMAL LOW (ref >60)
GFR calc non Af Amer: 21 mL/min — ABNORMAL LOW (ref >60)
Glucose, Bld: 106 mg/dL — ABNORMAL HIGH (ref 70–99)
Potassium: 3.9 mmol/L (ref 3.5–5.1)
Sodium: 134 mmol/L — ABNORMAL LOW (ref 135–145)
Total Bilirubin: 1.6 mg/dL — ABNORMAL HIGH (ref 0.3–1.2)
Total Protein: 6.3 g/dL — ABNORMAL LOW (ref 6.5–8.1)

## 2019-02-05 LAB — CBC
HCT: 34.8 % — ABNORMAL LOW (ref 39.0–52.0)
Hemoglobin: 11.8 g/dL — ABNORMAL LOW (ref 13.0–17.0)
MCH: 25.6 pg — ABNORMAL LOW (ref 26.0–34.0)
MCHC: 33.9 g/dL (ref 30.0–36.0)
MCV: 75.5 fL — ABNORMAL LOW (ref 80.0–100.0)
Platelets: 88 10*3/uL — ABNORMAL LOW (ref 150–400)
RBC: 4.61 MIL/uL (ref 4.22–5.81)
RDW: 16.8 % — ABNORMAL HIGH (ref 11.5–15.5)
WBC: 7.9 10*3/uL (ref 4.0–10.5)
nRBC: 2.8 % — ABNORMAL HIGH (ref 0.0–0.2)

## 2019-02-05 LAB — PROTIME-INR
INR: 2.5 — ABNORMAL HIGH (ref 0.8–1.2)
Prothrombin Time: 26.7 seconds — ABNORMAL HIGH (ref 11.4–15.2)

## 2019-02-05 MED ORDER — MORPHINE SULFATE (PF) 2 MG/ML IV SOLN: 1.0000 mg | INTRAVENOUS | Status: DC | PRN

## 2019-02-05 MED ORDER — TRAMADOL HCL 50 MG PO TABS
50.0000 mg | ORAL_TABLET | Freq: Two times a day (BID) | ORAL | Status: DC | PRN
  Administered 2019-02-05 – 2019-02-06 (×2): 50 mg via ORAL
  Filled 2019-02-05 (×2): qty 1

## 2019-02-05 NOTE — Progress Notes (Signed)
Pulmonary Medicine          Date: 02/05/2019,   MRN# [id number] [name] [birth year]     AdmissionWeight: 71.7 kg                 CurrentWeight: 75.3 kg      CHIEF COMPLAINT:   Acute hypoxemic respiratory failure and pulmonary hypertension   SUBJECTIVE   Patient was hallucinating this morning, states he heard people yelling his name and some other things.   He has worsening LE edema, crackles b/l worse at bases. Net 2L negative.   Reports headache.   PAST MEDICAL HISTORY   Past Medical History:  Diagnosis Date   Alcohol abuse    Bipolar 1 disorder (HCC)    CHF (congestive heart failure) (HCC)    COPD (chronic obstructive pulmonary disease) (HCC)    Hip pain    Hypertension    Pulmonary HTN (HCC)    Valvular heart disease      SURGICAL HISTORY   Past Surgical History:  Procedure Laterality Date   AORTIC VALVE REPLACEMENT       FAMILY HISTORY   History reviewed. No pertinent family history.   SOCIAL HISTORY   Social History   Tobacco Use   Smoking status: Current Every Day Smoker    Packs/day: 1.00    Types: Cigarettes   Smokeless tobacco: Never Used  Substance Use Topics   Alcohol use: Yes   Drug use: Yes    Types: Cocaine     MEDICATIONS    Home Medication:    Current Medication:  Current Facility-Administered Medications:    0.9 %  sodium chloride infusion, 250 mL, Intravenous, PRN, [name], Hubbard Hartshorn, NP   [COMPLETED] acetylcysteine (ACETADOTE) 40 mg/mL load via infusion 10,890 mg, 150 mg/kg, Intravenous, Once, 10,890 mg at 02/03/19 1300 **FOLLOWED BY** acetylcysteine (ACETADOTE) 24,000 mg in dextrose 5 % 600 mL (40 mg/mL) infusion, 15 mg/kg/hr, Intravenous, Continuous, Delfino Lovett, MD, Last Rate: 27.2 mL/hr at 02/05/19 0046, 15 mg/kg/hr at 02/05/19 0046   bisacodyl (DULCOLAX) EC tablet 10 mg, 10 mg, Oral, Daily PRN, [name], [name], MD   feeding supplement (BOOST / RESOURCE BREEZE) liquid 1  Container, 1 Container, Oral, TID BM, [name], Loel Dubonnet, MD, 1 Container at 02/05/19 0956   fluticasone (FLONASE) 50 MCG/ACT nasal spray 2 spray, 2 spray, Each Nare, Daily, [name], [name], MD, 2 spray at 02/05/19 0957   folic acid (FOLVITE) tablet 1 mg, 1 mg, Oral, Daily, [name], Hubbard Hartshorn, NP, 1 mg at 02/05/19 0956   ipratropium-albuterol (DUONEB) 0.5-2.5 (3) MG/3ML nebulizer solution 3 mL, 3 mL, Nebulization, Q6H PRN, Sherryll Burger, Vipul, MD   lactulose (CHRONULAC) 10 GM/15ML solution 20 g, 20 g, Oral, Daily, [name], Loel Dubonnet, MD, 20 g at 02/04/19 2056   MEDLINE mouth rinse, 15 mL, Mouth Rinse, BID, [name], Hubbard Hartshorn, NP, 15 mL at 02/05/19 0958   midodrine (PROAMATINE) tablet 5 mg, 5 mg, Oral, TID WC, [name], [name], MD, 5 mg at 02/05/19 1211   multivitamin with minerals tablet 1 tablet, 1 tablet, Oral, Daily, [name], Hubbard Hartshorn, NP, 1 tablet at 02/05/19 0956   ondansetron (ZOFRAN) injection 4 mg, 4 mg, Intravenous, Q6H PRN, Jimmye Norman, NP, 4 mg at 02/02/19 1444   pneumococcal 23 valent vaccine (PNU-IMMUNE) injection 0.5 mL, 0.5 mL, Intramuscular, Tomorrow-1000, [name], Hubbard Hartshorn, NP   polyethylene glycol (MIRALAX / GLYCOLAX) packet 17 g, 17 g, Oral, Daily, [name], [name], MD, 17 g at 02/04/19 1207  sodium chloride (OCEAN) 0.65 % nasal spray 1 spray, 1 spray, Each Nare, PRN, [name], [name], MD   sodium chloride flush (NS) 0.9 % injection 3 mL, 3 mL, Intravenous, Q12H, [name], [name], NP, 3 mL at 02/05/19 0959   sodium chloride flush (NS) 0.9 % injection 3 mL, 3 mL, Intravenous, PRN, [name], [name], NP, 3 mL at 02/01/19 1355   thiamine (VITAMIN B-1) tablet 100 mg, 100 mg, Oral, Daily, 100 mg at 02/05/19 0956 **OR** thiamine (B-1) injection 100 mg, 100 mg, Intravenous, Daily, [name], [name], NP    ALLERGIES   Patient has no known allergies.     REVIEW OF SYSTEMS    Review of Systems:  Gen:  Denies  fever, sweats,  chills weigh loss  HEENT: Denies blurred vision, double vision, ear pain, eye pain, hearing loss, nose bleeds, sore throat Cardiac:  No dizziness, chest pain or heaviness, chest tightness,edema Resp:   Denies cough or sputum porduction, shortness of breath,wheezing, hemoptysis,  Gi: Denies swallowing difficulty, stomach pain, nausea or vomiting, diarrhea, constipation, bowel incontinence Gu:  Denies bladder incontinence, burning urine Ext:   Denies Joint pain, stiffness or swelling Skin: Denies  skin rash, easy bruising or bleeding or hives Endoc:  Denies polyuria, polydipsia , polyphagia or weight change Psych:   Denies depression, insomnia or hallucinations   Other:  All other systems negative   VS: BP 128/67 (BP Location: Left Arm)    Pulse 66    Temp 98 F (36.7 C) (Oral)    Resp 18    Ht 5\' 8"  (1.727 m)    Wt 75.3 kg    SpO2 98%    BMI 25.23 kg/m      PHYSICAL EXAM    GENERAL:NAD, no fevers, chills, no weakness no fatigue HEAD: Normocephalic, atraumatic.  EYES: Pupils equal, round, reactive to light. Extraocular muscles intact. No scleral icterus.  MOUTH: Moist mucosal membrane. Dentition intact. No abscess noted.  EAR, NOSE, THROAT: Clear without exudates. No external lesions.  NECK: Supple. No thyromegaly. No nodules. No JVD.  PULMONARY: mild crackles at bases b/l worse from previous CARDIOVASCULAR: S1 and S2. Regular rate and rhythm. No murmurs, rubs, or gallops. No edema. Pedal pulses 2+ bilaterally.  GASTROINTESTINAL: Soft, nontender, nondistended. No masses. Positive bowel sounds. No hepatosplenomegaly.  MUSCULOSKELETAL: No swelling, clubbing, or edema. Range of motion full in all extremities.  NEUROLOGIC: Cranial nerves II through XII are intact. No gross focal neurological deficits. Sensation intact. Reflexes intact.  SKIN: No ulceration, lesions, rashes, or cyanosis. Skin warm and dry. Turgor intact.  PSYCHIATRIC: Mood, affect within normal limits. The patient is  awake, alert and oriented x 3. Insight, judgment intact.       IMAGING    Ct Abdomen Pelvis Wo Contrast  Result Date: 02/04/2019 CLINICAL DATA:  Abdominal distention EXAM: CT ABDOMEN AND PELVIS WITHOUT CONTRAST TECHNIQUE: Multidetector CT imaging of the abdomen and pelvis was performed following the standard protocol without IV contrast. COMPARISON:  None. FINDINGS: Lower chest: Small or moderate right pleural effusion with atelectasis. Postoperative cardiac valves, incompletely covered. Hepatobiliary: History of cirrhosis. No focal liver abnormality.No evidence of biliary obstruction or stone. Pancreas: Unremarkable. Spleen: Unremarkable. Adrenals/Urinary Tract: Negative adrenals. No hydronephrosis or ureteral stone. Tiny left upper pole calculus. Unremarkable bladder. Stomach/Bowel: No obstruction. No evidence of bowel inflammation. Mild left colonic diverticulosis. Vascular/Lymphatic: No acute vascular abnormality. Extensive atherosclerotic calcification no mass or adenopathy. Reproductive:Negative. Other: Small to moderate ascites without loculation. No pneumoperitoneum. Anasarca Musculoskeletal: Advanced  lumbar spine degeneration. Severe left hip osteoarthritis with bone-on-bone contact and eccentric subchondral geode formation. 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 IMPRESSION: 1. Small to moderate ascites and right pleural effusion with anasarca. No other explanation for abdominal distention, no bowel obstruction 2. Aortic Atherosclerosis (ICD10-I70.0) and other chronic findings are described above. Electronically Signed   By: Marnee Spring M.D.   On: 02/04/2019 10:50   Dg Chest 2 View  Result Date: 01/21/2019 CLINICAL DATA:  Dyspnea for few weeks with left lower extremity swelling EXAM: CHEST - 2 VIEW COMPARISON:  01/08/2019 chest radiograph. FINDINGS: Intact sternotomy wires. Mitral annuloplasty ring in place. Stable cardiomediastinal silhouette with mild  cardiomegaly. No pneumothorax. Small right pleural effusion is increased. No left pleural effusion. Mild pulmonary edema. Patchy right greater than left bibasilar lung opacities worsened on the right. IMPRESSION: 1. Mild congestive heart failure with increased small right pleural effusion. 2. Patchy bibasilar lung opacities, right greater than left, worsened on the right, favor atelectasis, cannot exclude a component of aspiration or pneumonia. Chest radiograph follow-up advised. Electronically Signed   By: Delbert Phenix M.D.   On: 01/21/2019 17:28   Dg Chest 2 View  Result Date: 01/08/2019 CLINICAL DATA:  Pulmonary embolism. Hx of copd, smoker EXAM: CHEST - 2 VIEW COMPARISON:  Chest radiograph 01/06/2019 FINDINGS: Stable cardiomediastinal contours with enlarged heart size. Surgical changes status post valve replacement. Stable pleuroparenchymal opacity at the right lung base with fluid extending into the minor fissure. Vascular congestion without overt edema. No acute finding in the visualized skeleton. IMPRESSION: Stable pleuroparenchymal opacity at the right lung base with fluid extending into the minor fissure. Electronically Signed   By: Emmaline Kluver M.D.   On: 01/08/2019 08:37   Dg Chest 2 View  Result Date: 01/06/2019 CLINICAL DATA:  Shortness of breath. EXAM: CHEST - 2 VIEW COMPARISON:  Radiograph 12/28/2018 FINDINGS: Post median sternotomy with prosthetic valve. Similar cardiomegaly and mediastinal contours. Worsening pleuroparenchymal opacity at the right lung base with increasing pleural effusion. Probable fluid in the minor fissure. There is vascular congestion without overt pulmonary edema. No pneumothorax. Unchanged retrocardiac atelectasis. IMPRESSION: 1. Increasing right pleuroparenchymal opacity and pleural effusion over the past 2 days. 2. Cardiomegaly with vascular congestion. Electronically Signed   By: Narda Rutherford M.D.   On: 01/06/2019 20:04   US Renal  Result Date:  02/03/2019 CLINICAL DATA:  Acute renal insufficiency. EXAM: RENAL / URINARY TRACT ULTRASOUND COMPLETE COMPARISON:  None. FINDINGS: Right Kidney: Renal measurements: 11.6 x 5.1 x 5.7 cm = volume: 176 mL. Mild increased cortical echogenicity. No mass or hydronephrosis visualized. Left Kidney: Renal measurements: 12.2 x 5.3 x 5.7 cm = volume: 193 mL. Mild increased cortical echogenicity. No mass or hydronephrosis visualized. Bladder: Appears normal for degree of bladder distention, however not well distended. Other: Small amount of ascites.  Small amount of pleural fluid. IMPRESSION: 1. Normal size kidneys without hydronephrosis. Mild increased cortical echogenicity which can be seen in medical renal disease. 2.  Minimal ascites.  Small amount of pleural fluid. Electronically Signed   By: Elberta Fortis M.D.   On: 02/03/2019 08:13   US Paracentesis  Result Date: 02/02/2019 INDICATION: 58 year old with ascites and abdominal distention. EXAM: ULTRASOUND GUIDED PARACENTESIS MEDICATIONS: None. COMPLICATIONS: None immediate. PROCEDURE: Informed written consent was obtained from the patient after a discussion of the risks, benefits and alternatives to treatment. A timeout was performed prior to the initiation of the procedure. Initial ultrasound scanning demonstrates a small  amount of ascites within the right lower abdominal quadrant. The right lower abdomen was prepped and draped in the usual sterile fashion. 1% lidocaine was used for local anesthesia. Following this, a 19 gauge, 7-cm, Yueh catheter was introduced. An ultrasound image was saved for documentation purposes. The paracentesis was performed. The catheter was removed and a dressing was applied. The patient tolerated the procedure well without immediate post procedural complication. FINDINGS: A total of approximately 650 mL of yellow fluid was removed. Samples were sent to the laboratory as requested by the clinical team. IMPRESSION: Successful  ultrasound-guided paracentesis yielding 650 mL of peritoneal fluid. Electronically Signed   By: Markus Daft M.D.   On: 02/02/2019 14:37   Dg Chest Portable 1 View  Result Date: 01/30/2019 CLINICAL DATA:  Shortness of breath EXAM: PORTABLE CHEST 1 VIEW COMPARISON:  01/21/2019 FINDINGS: No significant interval change in AP portable examination with small right pleural effusion and associated atelectasis or consolidation. The left lung is normally aerated. Cardiomegaly status post median sternotomy. IMPRESSION: 1. No significant interval change in AP portable examination with small right pleural effusion and associated atelectasis or consolidation. The left lung is normally aerated. 2.  Cardiomegaly status post median sternotomy. Electronically Signed   By: Eddie Candle M.D.   On: 01/30/2019 16:41   Dg Abd 2 Views  Result Date: 01/31/2019 CLINICAL DATA:  Abdominal pain and distension EXAM: ABDOMEN - 2 VIEW COMPARISON:  None. FINDINGS: Nonobstructive pattern of bowel gas with gas present to the rectum. Scattered stool present throughout the colon. No free air in the abdomen. Cardiomegaly in the included lower chest, better assessed by dedicated chest radiograph. 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 IMPRESSION: Nonobstructive pattern of bowel gas with gas present to the rectum. Scattered stool present throughout the colon. No free air in the abdomen. Electronically Signed   By: Eddie Candle M.D.   On: 01/31/2019 17:32   US Liver Doppler  Result Date: 02/05/2019 CLINICAL DATA:  Cirrhosis.  Elevated LFTs. EXAM: DUPLEX ULTRASOUND OF LIVER TECHNIQUE: Color and duplex Doppler ultrasound was performed to evaluate the hepatic in-flow and out-flow vessels. COMPARISON:  Noncontrast CT abdomen pelvis-02/04/2019; ultrasound-guided paracentesis-02/02/2019 yielding 650 cc of peritoneal fluid; chest CTA-08/07/2011 FINDINGS: Liver: There is mild diffuse increased echogenicity of  the hepatic parenchyma with mild nodularity hepatic contour. No discrete hepatic lesions. No intrahepatic biliary dilatation. Main Portal Vein size: 1.4 cm Portal Vein Velocities Main Prox:  29 cm/sec Main Mid: 35 cm/sec Main Dist:  25 cm/sec Right: 24 cm/sec Left: 24 cm/sec Normal hepatopetal directional flow is demonstrated throughout the portal venous system. Hepatic Vein Velocities Right:  28 cm/sec Middle:  27 cm/sec Left:  25 cm/sec Normal hepatofugal directional flow is demonstrated throughout the hepatic venous system. IVC: Present and patent with normal respiratory phasicity. Hepatic Artery Velocity:  37 cm/sec Splenic Vein Velocity:  5.7 cm/sec Spleen: 6.4 cm x 9.7 cm x 4.1 cm with a total volume of 134 cm^3 (411 cm^3 is upper limit normal) Portal Vein Occlusion/Thrombus: No Splenic Vein Occlusion/Thrombus: No Ascites: Small volume intra-abdominal ascites. Note is also made of a trace right-sided pleural effusion (image 15), similar to preceding noncontrast CT scan of the abdomen and pelvis. Varices: None IMPRESSION: 1. Findings suggestive hepatic steatosis with nodularity hepatic contour suggestive of cirrhotic change. No discrete lesions. Further evaluation with nonemergent abdominal MRI could be performed as indicated. 2. Patent hepatic vasculature with normal directional flow. 3. Small volume intra-abdominal ascites and trace  right-sided pleural effusion. 4. No evidence of splenomegaly. Electronically Signed   By: Simonne Come M.D.   On: 02/05/2019 09:20   US Abdomen Limited Ruq  Result Date: 02/01/2019 CLINICAL DATA:  Cirrhosis and abdominal distension EXAM: ULTRASOUND ABDOMEN LIMITED RIGHT UPPER QUADRANT COMPARISON:  None. FINDINGS: Gallbladder: No gallstones visualized. The gallbladder wall is diffusely thickened measuring up to 0.7 cm. No sonographic Murphy sign noted by sonographer. Common bile duct: Diameter: 0.4 cm Liver: No focal lesion identified. The liver contour is mildly nodular.  Within normal limits in parenchymal echogenicity. Portal vein is patent on color Doppler imaging with normal direction of blood flow towards the liver. Other: Mild right upper quadrant ascites. Small right pleural effusion. IMPRESSION: 1. Mildly nodular contour of the liver consistent with history of cirrhosis. Mild right upper quadrant ascites and small pleural effusion visualized. 2. Gallbladder wall thickening which is a nonspecific finding and can be seen in the setting of gallbladder inflammation, cirrhosis, fluid overload states, hypoalbuminemia, among others. No gallstones visualized. Electronically Signed   By: Emmaline Kluver M.D.   On: 02/01/2019 09:11      ASSESSMENT/PLAN   Acute respiratory distress with mild hypoxemia   -Likely due to fluid overload from continued alcohol abuse and likely liver cirrhosis   -initially improved but now slightly worse then yesterday.    -stopped ativan and remeron - patient states he is no longer dizzy.     Pulmonary arterial hypertension   -Likely group 2 and 3 secondary to cardiac disease and chronic COPD   -Treating underlying cause to optimize cardiac and pulmonary function   -We will need outpatient pulmonary evaluation   Centrilobular emphysema Continue typical COPD care path   Tobacco abuse    -Smoking cessation counseling    -Transdermal nicotine replacement therapy     Thank you Dr. Amado Coe for allowing me to participate in the care of this patient.   Patient/Family are satisfied with care plan and all questions have been answered.  This document was prepared using Dragon voice recognition software and may include unintentional dictation errors.     Vida Rigger, M.D.  Division of Pulmonary & Critical Care Medicine  [facility]

## 2019-02-05 NOTE — Progress Notes (Signed)
[facility], [place] 02/05/19  Subjective:   Patient c/o congestion, left epistaxis States appetite is improving slowly. Serum creatinine starting to improve   Objective:  Vital signs in last 24 hours:  Temp:  [97.5 F (36.4 C)-98 F (36.7 C)] 98 F (36.7 C) (10/30 0950) Pulse Rate:  [66-78] 66 (10/30 0950) Resp:  [18-19] 18 (10/30 0950) BP: (119-129)/(67-86) 128/67 (10/30 0950) SpO2:  [95 %-98 %] 98 % (10/30 0950) Weight:  [75.3 kg] 75.3 kg (10/30 0347)  Weight change: -0.672 kg Filed Weights   02/03/19 0354 02/04/19 0424 02/05/19 0347  Weight: 72.6 kg 75.9 kg 75.3 kg    Intake/Output:    Intake/Output Summary (Last 24 hours) at 02/05/2019 1159 Last data filed at 02/05/2019 2025 Gross per 24 hour  Intake 1061.85 ml  Output 1100 ml  Net -38.15 ml   Physical Exam: General:  No acute distress, laying in the bed  HEENT  anicteric, moist oral mucous membranes  Lungs:  Normal breathing effort, room air, mild rhonchi  Heart::   Irregular rhythm, prominent systolic murmur  Abdomen:  Soft, nontender  Extremities:  + peripheral edema  Neurologic:  Alert, oriented  Skin:  no acute rashes     Basic Metabolic Panel:  Recent Labs  Lab 02/01/19 2323 02/02/19 0525 02/03/19 0527 02/04/19 0547 02/05/19 0623  NA 136 136 135 134* 134*  K 5.9* 5.3* 5.5* 4.3 3.9  CL 99 101 101 98 100  CO2 22 24 21* 20* 20*  GLUCOSE 91 104* 135* 93 106*  BUN 54* 62* 82* 93* 82*  CREATININE 3.17* 3.51* 4.67* 4.91* 3.15*  CALCIUM 8.8* 8.8* 8.3* 8.2* 8.8*  MG 2.2  --   --   --   --   PHOS 7.4*  --   --   --   --      CBC: Recent Labs  Lab 01/30/19 1600 02/01/19 2323 02/02/19 0525 02/04/19 0547 02/05/19 0623  WBC 7.1 12.3* 10.7* 9.8 7.9  NEUTROABS 4.5  --  7.7  --   --   HGB 12.0* 12.9* 13.1 12.2* 11.8*  HCT 37.3* 40.5 40.2 36.7* 34.8*  MCV 80.4 80.7 78.8* 76.9* 75.5*  PLT 216 207 195 93* 88*      Lab Results  Component Value Date   HEPBSAG  NON REACTIVE 02/02/2019      Microbiology:  Recent Results (from the past 240 hour(s))  SARS CORONAVIRUS 2 (TAT 6-24 HRS) Nasopharyngeal Nasopharyngeal Swab     Status: None   Collection Time: 01/30/19  4:00 PM   Specimen: Nasopharyngeal Swab  Result Value Ref Range Status   SARS Coronavirus 2 NEGATIVE NEGATIVE Final    Comment: (NOTE) SARS-CoV-2 target nucleic acids are NOT DETECTED. The SARS-CoV-2 RNA is generally detectable in upper and lower respiratory specimens during the acute phase of infection. Negative results do not preclude SARS-CoV-2 infection, do not rule out co-infections with other pathogens, and should not be used as the sole basis for treatment or other patient management decisions. Negative results must be combined with clinical observations, patient history, and epidemiological information. The expected result is Negative. Fact Sheet for Patients: SugarRoll.be Fact Sheet for Healthcare Providers: https://www.woods-mathews.com/ This test is not yet approved or cleared by the [place] FDA and  has been authorized for detection and/or diagnosis of SARS-CoV-2 by FDA under an Emergency Use Authorization (EUA). This EUA will remain  in effect (meaning this test can be used) for the duration of the COVID-19  declaration under Section 56 4(b)(1) of the Act, 21 U.S.C. section 360bbb-3(b)(1), unless the authorization is terminated or revoked sooner. Performed at [facility], [address] [place], [place] [address]   Body fluid culture     Status: None   Collection Time: 02/02/19 11:45 AM   Specimen: PATH Cytology Peritoneal fluid  Result Value Ref Range Status   Specimen Description   Final    PERITONEAL Performed at [facility], [address] [place], [place] [address]    Special Requests   Final    NONE Performed at [facility], [address] [place], [place] [address]    Gram  Stain   Final    RARE WBC PRESENT,BOTH PMN AND MONONUCLEAR NO ORGANISMS SEEN    Culture   Final    NO GROWTH Performed at [facility], [address] [place], [place] [address]    Report Status 02/05/2019 FINAL  Final  Acid Fast Smear (AFB)     Status: None   Collection Time: 02/02/19 11:45 AM   Specimen: PATH Cytology Peritoneal fluid  Result Value Ref Range Status   AFB Specimen Processing Concentration  Final   Acid Fast Smear Negative  Final    Comment: (NOTE) Performed At: [facility] [address] [place], [place] [id number] Jolene SchimkeNagendra Sanjai MD MV:[phone]    Source (AFB) PERITONEAL  Final    Comment: Performed at [facility], [address] [place], [place] [address]  Culture, blood (routine x 2)     Status: None (Preliminary result)   Collection Time: 02/03/19  3:05 PM   Specimen: BLOOD  Result Value Ref Range Status   Specimen Description BLOOD BOTTLES DRAWN AEROBIC AND ANAEROBIC  Final   Special Requests Blood Culture adequate volume BLOOD LEFT HAND  Final   Culture   Final    NO GROWTH 2 DAYS Performed at [facility], [address] [place], [place] [address]    Report Status PENDING  Incomplete  Culture, blood (routine x 2)     Status: None (Preliminary result)   Collection Time: 02/03/19  3:16 PM   Specimen: BLOOD  Result Value Ref Range Status   Specimen Description BLOOD Blood Culture adequate volume  Final   Special Requests   Final    BOTTLES DRAWN AEROBIC AND ANAEROBIC BLOOD RIGHT HAND   Culture   Final    NO GROWTH 2 DAYS Performed at [facility], [address] [place], [place] [address]    Report Status PENDING  Incomplete    Coagulation Studies: Recent Labs    02/03/19 1003 02/04/19 0547 02/05/19 0623  LABPROT 33.4* 32.5* 26.7*  INR 3.3* 3.2* 2.5*    Urinalysis: Recent Labs    02/03/19 1515  COLORURINE YELLOW*  LABSPEC 1.017  PHURINE 5.0  GLUCOSEU NEGATIVE  HGBUR NEGATIVE   BILIRUBINUR NEGATIVE  KETONESUR 5*  PROTEINUR NEGATIVE  NITRITE NEGATIVE  LEUKOCYTESUR NEGATIVE      Imaging: Ct Abdomen Pelvis Wo Contrast  Result Date: 02/04/2019 CLINICAL DATA:  Abdominal distention EXAM: CT ABDOMEN AND PELVIS WITHOUT CONTRAST TECHNIQUE: Multidetector CT imaging of the abdomen and pelvis was performed following the standard protocol without IV contrast. COMPARISON:  None. FINDINGS: Lower chest: Small or moderate right pleural effusion with atelectasis. Postoperative cardiac valves, incompletely covered. Hepatobiliary: History of cirrhosis. No focal liver abnormality.No evidence of biliary obstruction or stone. Pancreas: Unremarkable. Spleen: Unremarkable. Adrenals/Urinary Tract: Negative adrenals. No hydronephrosis or ureteral stone.  Tiny left upper pole calculus. Unremarkable bladder. Stomach/Bowel: No obstruction. No evidence of bowel inflammation. Mild left colonic diverticulosis. Vascular/Lymphatic: No acute vascular abnormality. Extensive atherosclerotic calcification no mass or adenopathy. Reproductive:Negative. Other: Small to moderate ascites without loculation. No pneumoperitoneum. Anasarca Musculoskeletal: Advanced lumbar spine degeneration. Severe left hip osteoarthritis with bone-on-bone contact and eccentric subchondral geode formation. 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 IMPRESSION: 1. Small to moderate ascites and right pleural effusion with anasarca. No other explanation for abdominal distention, no bowel obstruction 2. Aortic Atherosclerosis (ICD10-I70.0) and other chronic findings are described above. Electronically Signed   By: Marnee Spring M.D.   On: 02/04/2019 10:50   US Liver Doppler  Result Date: 02/05/2019 CLINICAL DATA:  Cirrhosis.  Elevated LFTs. EXAM: DUPLEX ULTRASOUND OF LIVER TECHNIQUE: Color and duplex Doppler ultrasound was performed to evaluate the hepatic in-flow and out-flow vessels. COMPARISON:  Noncontrast CT  abdomen pelvis-02/04/2019; ultrasound-guided paracentesis-02/02/2019 yielding 650 cc of peritoneal fluid; chest CTA-08/07/2011 FINDINGS: Liver: There is mild diffuse increased echogenicity of the hepatic parenchyma with mild nodularity hepatic contour. No discrete hepatic lesions. No intrahepatic biliary dilatation. Main Portal Vein size: 1.4 cm Portal Vein Velocities Main Prox:  29 cm/sec Main Mid: 35 cm/sec Main Dist:  25 cm/sec Right: 24 cm/sec Left: 24 cm/sec Normal hepatopetal directional flow is demonstrated throughout the portal venous system. Hepatic Vein Velocities Right:  28 cm/sec Middle:  27 cm/sec Left:  25 cm/sec Normal hepatofugal directional flow is demonstrated throughout the hepatic venous system. IVC: Present and patent with normal respiratory phasicity. Hepatic Artery Velocity:  37 cm/sec Splenic Vein Velocity:  5.7 cm/sec Spleen: 6.4 cm x 9.7 cm x 4.1 cm with a total volume of 134 cm^3 (411 cm^3 is upper limit normal) Portal Vein Occlusion/Thrombus: No Splenic Vein Occlusion/Thrombus: No Ascites: Small volume intra-abdominal ascites. Note is also made of a trace right-sided pleural effusion (image 15), similar to preceding noncontrast CT scan of the abdomen and pelvis. Varices: None IMPRESSION: 1. Findings suggestive hepatic steatosis with nodularity hepatic contour suggestive of cirrhotic change. No discrete lesions. Further evaluation with nonemergent abdominal MRI could be performed as indicated. 2. Patent hepatic vasculature with normal directional flow. 3. Small volume intra-abdominal ascites and trace right-sided pleural effusion. 4. No evidence of splenomegaly. Electronically Signed   By: Simonne Come M.D.   On: 02/05/2019 09:20     Medications:   . sodium chloride    . acetylcysteine 15 mg/kg/hr (02/05/19 0046)   . feeding supplement  1 Container Oral TID BM  . fluticasone  2 spray Each Nare Daily  . folic acid  1 mg Oral Daily  . lactulose  20 g Oral Daily  . mouth rinse  15  mL Mouth Rinse BID  . midodrine  5 mg Oral TID WC  . multivitamin with minerals  1 tablet Oral Daily  . pneumococcal 23 valent vaccine  0.5 mL Intramuscular Tomorrow-1000  . polyethylene glycol  17 g Oral Daily  . sodium chloride flush  3 mL Intravenous Q12H  . thiamine  100 mg Oral Daily   Or  . thiamine  100 mg Intravenous Daily   sodium chloride, bisacodyl, ipratropium-albuterol, ondansetron (ZOFRAN) IV, sodium chloride, sodium chloride flush  Assessment/ Plan:  58 y.o.African American male  with history of bipolar disorder, diabetes, hypertension, COPD, aortic valve replacement on 1990 and aortic and mitral valve repair in 2000 at [facility], history of hepatitis C treated with Harvoni at [facility] 4 years ago,  was admitted on 01/30/2019 with worsening edema and ascites.   Active problems: Cirrhosis, pulmonary hypertension,moderate mitral regurgitation, calcification of mitral valve, dilated right atrium and left atrium,With EF of 50 to 55% and dilated cardiomyopathy Moderately reduced LV function, diastolic dysfunction, acute hepatitis   # Acute kidney injury -Differential diagnosis includes cardiorenal syndrome, prerenal and ATN leading to AKI from aggressive diuresis -Avoid hypotension.    Continue midodrine 5 mg 3 times daily -Avoid nephrotoxins, NSAIDs, IV contrast   - Hold further iv fluid supplementation -Renal function has started to improve.  Low chance that he will need hemodialysis this admission.  # Hyperkalemia Lab Results  Component Value Date   K 3.9 02/05/2019   -discontinue Lokelma -Follow low-salt diet - in normal range today  # GI - Cirrhosis, alcoholic, Acute hepatitis , coagulopathy - Reports that he was told not to drink alcohol after hepatitis C treatment but he started drinking beer anyway.  Currently he reports drinking 2 - 40 ounce beers daily and liquor occasionally. -Advised patient to quit alcohol, smoking,drugs -management as per GI team    LOS:  6 Keenya Matera 10/30/202011:59 AM  [facility] [place], [place] [phone]  Note: This note was prepared with Dragon dictation. Any transcription errors are unintentional

## 2019-02-05 NOTE — Progress Notes (Signed)
Patient ID: [name], male   DOB: Dec 02, 1960, 58 y.o.   MRN: [id number]  [employer] PROGRESS NOTE  [name] JQZ:[id number] DOB: 11-26-1960 DOA: 01/30/2019 PCP: Medicine, [employer]  HPI/Subjective: Slowly improving, tired Objective: Vitals:   02/05/19 0950 02/05/19 1507  BP: 128/67 130/82  Pulse: 66 72  Resp: 18 19  Temp: 98 F (36.7 C) 98.3 F (36.8 C)  SpO2: 98% 97%    Intake/Output Summary (Last 24 hours) at 02/05/2019 2025 Last data filed at 02/05/2019 1558 Gross per 24 hour  Intake 542.09 ml  Output 850 ml  Net -307.91 ml   Filed Weights   02/03/19 0354 02/04/19 0424 02/05/19 0347  Weight: 72.6 kg 75.9 kg 75.3 kg    ROS: Review of Systems  Constitutional: Negative for chills and fever.  Eyes: Negative for blurred vision.  Respiratory: Positive for cough and shortness of breath.   Cardiovascular: Negative for chest pain.  Gastrointestinal: Positive for abdominal pain. Negative for constipation, diarrhea, nausea and vomiting.  Genitourinary: Negative for dysuria.  Musculoskeletal: Negative for joint pain.  Neurological: Negative for dizziness and headaches.   Exam: Physical Exam  Constitutional: He is oriented to person, place, and time.  HENT:  Nose: No mucosal edema.  Mouth/Throat: No oropharyngeal exudate or posterior oropharyngeal edema.  Eyes: Pupils are equal, round, and reactive to light. Conjunctivae, EOM and lids are normal.  Neck: No JVD present. Carotid bruit is not present. No edema present. No thyroid mass and no thyromegaly present.  Cardiovascular: S1 normal and S2 normal. Exam reveals no gallop.  Murmur heard.  Systolic murmur is present with a grade of 3/6. Respiratory: No respiratory distress. He has decreased breath sounds in the right lower field and the left lower field. He has no wheezes. He has no rhonchi. He has no rales.  GI: Soft. Bowel sounds are normal. He exhibits no distension. There is no abdominal tenderness.   Musculoskeletal:     Right ankle: He exhibits swelling.     Left ankle: He exhibits swelling.  Lymphadenopathy:    He has no cervical adenopathy.  Neurological: He is alert and oriented to person, place, and time. No cranial nerve deficit.  Skin: Skin is warm. No rash noted. Nails show no clubbing.  Psychiatric: He has a normal mood and affect.      Data Reviewed: Basic Metabolic Panel: Recent Labs  Lab 02/01/19 2323 02/02/19 0525 02/03/19 0527 02/04/19 0547 02/05/19 0623  NA 136 136 135 134* 134*  K 5.9* 5.3* 5.5* 4.3 3.9  CL 99 101 101 98 100  CO2 22 24 21* 20* 20*  GLUCOSE 91 104* 135* 93 106*  BUN 54* 62* 82* 93* 82*  CREATININE 3.17* 3.51* 4.67* 4.91* 3.15*  CALCIUM 8.8* 8.8* 8.3* 8.2* 8.8*  MG 2.2  --   --   --   --   PHOS 7.4*  --   --   --   --    Liver Function Tests: Recent Labs  Lab 02/01/19 2323 02/02/19 0525 02/03/19 0527 02/04/19 0547 02/05/19 0623  AST 531* 1,318* 2,174* 1,758* 1,057*  ALT 274* 624* 923* 1,037* 845*  ALKPHOS 158* 149* 130* 132* 130*  BILITOT 2.3* 2.2* 1.9* 1.6* 1.6*  PROT 6.5 6.6 6.3* 6.3* 6.3*  ALBUMIN 3.2* 3.1* 3.2* 3.0* 3.1*   CBC: Recent Labs  Lab 01/30/19 1600 02/01/19 2323 02/02/19 0525 02/04/19 0547 02/05/19 0623  WBC 7.1 12.3* 10.7* 9.8 7.9  NEUTROABS 4.5  --  7.7  --   --   HGB 12.0* 12.9* 13.1 12.2* 11.8*  HCT 37.3* 40.5 40.2 36.7* 34.8*  MCV 80.4 80.7 78.8* 76.9* 75.5*  PLT 216 207 195 93* 88*   BNP (last 3 results) Recent Labs    01/06/19 1922 01/30/19 1600  BNP 1,112.0* 1,234.0*      Recent Results (from the past 240 hour(s))  SARS CORONAVIRUS 2 (TAT 6-24 HRS) Nasopharyngeal Nasopharyngeal Swab     Status: None   Collection Time: 01/30/19  4:00 PM   Specimen: Nasopharyngeal Swab  Result Value Ref Range Status   SARS Coronavirus 2 NEGATIVE NEGATIVE Final    Comment: (NOTE) SARS-CoV-2 target nucleic acids are NOT DETECTED. The SARS-CoV-2 RNA is generally detectable in upper and  lower respiratory specimens during the acute phase of infection. Negative results do not preclude SARS-CoV-2 infection, do not rule out co-infections with other pathogens, and should not be used as the sole basis for treatment or other patient management decisions. Negative results must be combined with clinical observations, patient history, and epidemiological information. The expected result is Negative. Fact Sheet for Patients: SugarRoll.be Fact Sheet for Healthcare Providers: https://www.woods-mathews.com/ This test is not yet approved or cleared by the [place] FDA and  has been authorized for detection and/or diagnosis of SARS-CoV-2 by FDA under an Emergency Use Authorization (EUA). This EUA will remain  in effect (meaning this test can be used) for the duration of the COVID-19 declaration under Section 56 4(b)(1) of the Act, 21 U.S.C. section 360bbb-3(b)(1), unless the authorization is terminated or revoked sooner. Performed at [facility], [place] [address], [place], [place] [address]   Body fluid culture     Status: None   Collection Time: 02/02/19 11:45 AM   Specimen: PATH Cytology Peritoneal fluid  Result Value Ref Range Status   Specimen Description   Final    PERITONEAL Performed at [facility], [address] [place], [place] [address]    Special Requests   Final    NONE Performed at [facility], [address] [place], [place] [address]    Gram Stain   Final    RARE WBC PRESENT,BOTH PMN AND MONONUCLEAR NO ORGANISMS SEEN    Culture   Final    NO GROWTH Performed at [facility], [place] [address], [place], [place] [address]    Report Status 02/05/2019 FINAL  Final  Acid Fast Smear (AFB)     Status: None   Collection Time: 02/02/19 11:45 AM   Specimen: PATH Cytology Peritoneal fluid  Result Value Ref Range Status   AFB Specimen Processing Concentration  Final   Acid Fast Smear  Negative  Final    Comment: (NOTE) Performed At: [facility] [address] [place], [place] [id number] Rush Farmer MD JQ:[id number]    Source (AFB) PERITONEAL  Final    Comment: Performed at [facility], [address] [place], [place] [address]  Culture, blood (routine x 2)     Status: None (Preliminary result)   Collection Time: 02/03/19  3:05 PM   Specimen: BLOOD  Result Value Ref Range Status   Specimen Description BLOOD BOTTLES DRAWN AEROBIC AND ANAEROBIC  Final   Special Requests Blood Culture adequate volume BLOOD LEFT HAND  Final   Culture   Final    NO GROWTH 2 DAYS Performed at [facility], [address], [place], [place] [address]    Report Status PENDING  Incomplete  Culture,  blood (routine x 2)     Status: None (Preliminary result)   Collection Time: 02/03/19  3:16 PM   Specimen: BLOOD  Result Value Ref Range Status   Specimen Description BLOOD Blood Culture adequate volume  Final   Special Requests   Final    BOTTLES DRAWN AEROBIC AND ANAEROBIC BLOOD RIGHT HAND   Culture   Final    NO GROWTH 2 DAYS Performed at [facility], [address] [place], [place] [address]    Report Status PENDING  Incomplete     Studies: Ct Abdomen Pelvis Wo Contrast  Result Date: 02/04/2019 CLINICAL DATA:  Abdominal distention EXAM: CT ABDOMEN AND PELVIS WITHOUT CONTRAST TECHNIQUE: Multidetector CT imaging of the abdomen and pelvis was performed following the standard protocol without IV contrast. COMPARISON:  None. FINDINGS: Lower chest: Small or moderate right pleural effusion with atelectasis. Postoperative cardiac valves, incompletely covered. Hepatobiliary: History of cirrhosis. No focal liver abnormality.No evidence of biliary obstruction or stone. Pancreas: Unremarkable. Spleen: Unremarkable. Adrenals/Urinary Tract: Negative adrenals. No hydronephrosis or ureteral stone. Tiny left upper pole calculus. Unremarkable bladder.  Stomach/Bowel: No obstruction. No evidence of bowel inflammation. Mild left colonic diverticulosis. Vascular/Lymphatic: No acute vascular abnormality. Extensive atherosclerotic calcification no mass or adenopathy. Reproductive:Negative. Other: Small to moderate ascites without loculation. No pneumoperitoneum. Anasarca Musculoskeletal: Advanced lumbar spine degeneration. Severe left hip osteoarthritis with bone-on-bone contact and eccentric subchondral geode formation. 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 IMPRESSION: 1. Small to moderate ascites and right pleural effusion with anasarca. No other explanation for abdominal distention, no bowel obstruction 2. Aortic Atherosclerosis (ICD10-I70.0) and other chronic findings are described above. Electronically Signed   By: Marnee Spring M.D.   On: 02/04/2019 10:50   US Liver Doppler  Result Date: 02/05/2019 CLINICAL DATA:  Cirrhosis.  Elevated LFTs. EXAM: DUPLEX ULTRASOUND OF LIVER TECHNIQUE: Color and duplex Doppler ultrasound was performed to evaluate the hepatic in-flow and out-flow vessels. COMPARISON:  Noncontrast CT abdomen pelvis-02/04/2019; ultrasound-guided paracentesis-02/02/2019 yielding 650 cc of peritoneal fluid; chest CTA-08/07/2011 FINDINGS: Liver: There is mild diffuse increased echogenicity of the hepatic parenchyma with mild nodularity hepatic contour. No discrete hepatic lesions. No intrahepatic biliary dilatation. Main Portal Vein size: 1.4 cm Portal Vein Velocities Main Prox:  29 cm/sec Main Mid: 35 cm/sec Main Dist:  25 cm/sec Right: 24 cm/sec Left: 24 cm/sec Normal hepatopetal directional flow is demonstrated throughout the portal venous system. Hepatic Vein Velocities Right:  28 cm/sec Middle:  27 cm/sec Left:  25 cm/sec Normal hepatofugal directional flow is demonstrated throughout the hepatic venous system. IVC: Present and patent with normal respiratory phasicity. Hepatic Artery Velocity:  37 cm/sec Splenic  Vein Velocity:  5.7 cm/sec Spleen: 6.4 cm x 9.7 cm x 4.1 cm with a total volume of 134 cm^3 (411 cm^3 is upper limit normal) Portal Vein Occlusion/Thrombus: No Splenic Vein Occlusion/Thrombus: No Ascites: Small volume intra-abdominal ascites. Note is also made of a trace right-sided pleural effusion (image 15), similar to preceding noncontrast CT scan of the abdomen and pelvis. Varices: None IMPRESSION: 1. Findings suggestive hepatic steatosis with nodularity hepatic contour suggestive of cirrhotic change. No discrete lesions. Further evaluation with nonemergent abdominal MRI could be performed as indicated. 2. Patent hepatic vasculature with normal directional flow. 3. Small volume intra-abdominal ascites and trace right-sided pleural effusion. 4. No evidence of splenomegaly. Electronically Signed   By: Simonne Come M.D.   On: 02/05/2019 09:20    Scheduled Meds: . feeding supplement  1 Container  Oral TID BM  . fluticasone  2 spray Each Nare Daily  . folic acid  1 mg Oral Daily  . lactulose  20 g Oral Daily  . mouth rinse  15 mL Mouth Rinse BID  . midodrine  5 mg Oral TID WC  . multivitamin with minerals  1 tablet Oral Daily  . pneumococcal 23 valent vaccine  0.5 mL Intramuscular Tomorrow-1000  . polyethylene glycol  17 g Oral Daily  . sodium chloride flush  3 mL Intravenous Q12H  . thiamine  100 mg Oral Daily   Or  . thiamine  100 mg Intravenous Daily   Continuous Infusions: . sodium chloride    . acetylcysteine 15 mg/kg/hr (02/05/19 0046)    Assessment/Plan:  1. Acute diastolic congestive heart failure with moderate mitral regurgitation and severe tricuspid regurgitation. echo 2. Acute hypoxic respiratory failure.  Try to wean off oxygen.  Patient does not wear oxygen at home. 3. Pulmonary hypertension seen on echocardiogram.  Pulmonology following 4. 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  Ultrasound with the cholecystitis with gallbladder wall thickening but no gallstones  but patient is reporting abdominal distention .s/p ultrasound-guided paracentesis for symptomatic relief - no SBP. GI requested CT which confirms ascites but no other patho 5. Hyperlipidemia unspecified on Lipitor 6. Acute on CKD 2: worsening renal function with decrease UOP & Hyperkalemia - could be hepatorenal syndrome. Will c/s Nephro 7.  Acute hepatic failure - thought to be due to cocaine induced ischemia (UDS +) with underlying alcoholic hepatitis & cirrhosis, Ultrasound Doppler did not reveal portal vein thrombosis LFTs improving now 8. Acute renal failure - creat 3.15, improving 8. Constipation with scattered stool throughout the colon we will put him on stool softeners and laxatives Code Status:     Code Status Orders  (From admission, onward)         Start     Ordered   01/30/19 2200  Full code  Continuous     01/30/19 2203        Code Status History    Date Active Date Inactive Code Status Order ID Comments User Context   01/07/2019 0231 01/08/2019 1755 Full Code [id number]  Oralia ManisWillis, David, MD Inpatient   08/07/2011 1543 08/08/2011 1513 Full Code [id number]  Glynn Octaveancour, Stephen, MD ED   Advance Care Planning Activity     Family Communication: Patient did not want me to speak with family. Disposition Plan: To be determined  Consultants:  Cardiology  Time spent: 35  minutes  Analeigha Nauman [employer]  [employer] [facility]

## 2019-02-05 NOTE — Progress Notes (Signed)
Mike Repress, MD [address]  [address]  [place], [place] [address]  Main: [phone]  Fax: [phone] Pager: [phone]   Subjective: No new complaints, making good urine.  Asked him about cocaine positive result on urine drug screen, he was hesitant to admit that he used cocaine  Objective: Vital signs in last 24 hours: Vitals:   02/04/19 2003 02/05/19 0347 02/05/19 0950 02/05/19 1507  BP: 119/76 124/86 128/67 130/82  Pulse: 77 78 66 72  Resp: Temp: 97.7 F (36.5 C) (!) 97.5 F (36.4 C) 98 F (36.7 C) 98.3 F (36.8 C)  TempSrc: Oral Oral Oral   SpO2: 98% 95% 98% 97%  Weight:  75.3 kg    Height:       Weight change: -0.672 kg  Intake/Output Summary (Last 24 hours) at 02/05/2019 2045 Last data filed at 02/05/2019 1558 Gross per 24 hour  Intake 542.09 ml  Output 850 ml  Net -307.91 ml     Exam: Heart:: Regular rate and rhythm, S1S2 present or without murmur or extra heart sounds Lungs: normal and clear to auscultation Abdomen: soft, nontender, normal bowel sounds   Lab Results: CBC Latest Ref Rng & Units 02/05/2019 02/04/2019 02/02/2019  WBC 4.0 - 10.5 K/uL 7.9 9.8 10.7(H)  Hemoglobin 13.0 - 17.0 g/dL 11.8(L) 12.2(L) 13.1  Hematocrit 39.0 - 52.0 % 34.8(L) 36.7(L) 40.2  Platelets 150 - 400 K/uL 88(L) 93(L) 195   CMP Latest Ref Rng & Units 02/05/2019 02/04/2019 02/03/2019  Glucose 70 - 99 mg/dL 962(X) 93 528(U)  BUN 6 - 20 mg/dL 13(K) 44(W) 10(U)  Creatinine 0.61 - 1.24 mg/dL 7.25(D) 6.64(Q) 0.34(V)  Sodium 135 - 145 mmol/L 134(L) 134(L) 135  Potassium 3.5 - 5.1 mmol/L 3.9 4.3 5.5(H)  Chloride 98 - 111 mmol/L 100 98 101  CO2 22 - 32 mmol/L 20(L) 20(L) 21(L)  Calcium 8.9 - 10.3 mg/dL 4.2(V) 9.5(G) 8.3(L)  Total Protein 6.5 - 8.1 g/dL 6.3(L) 6.3(L) 6.3(L)  Total Bilirubin 0.3 - 1.2 mg/dL 3.8(V) 5.6(E) 1.9(H)  Alkaline Phos 38 - 126 U/L 130(H) 132(H) 130(H)  AST 15 - 41 U/L 1,057(H) 1,758(H) 2,174(H)  ALT 0 - 44 U/L 845(H)  1,037(H) 923(H)    Micro Results: Recent Results (from the past 240 hour(s))  SARS CORONAVIRUS 2 (TAT 6-24 HRS) Nasopharyngeal Nasopharyngeal Swab     Status: None   Collection Time: 01/30/19  4:00 PM   Specimen: Nasopharyngeal Swab  Result Value Ref Range Status   SARS Coronavirus 2 NEGATIVE NEGATIVE Final    Comment: (NOTE) SARS-CoV-2 target nucleic acids are NOT DETECTED. The SARS-CoV-2 RNA is generally detectable in upper and lower respiratory specimens during the acute phase of infection. Negative results do not preclude SARS-CoV-2 infection, do not rule out co-infections with other pathogens, and should not be used as the sole basis for treatment or other patient management decisions. Negative results must be combined with clinical observations, patient history, and epidemiological information. The expected result is Negative. Fact Sheet for Patients: HairSlick.no Fact Sheet for Healthcare Providers: quierodirigir.com This test is not yet approved or cleared by the [place] FDA and  has been authorized for detection and/or diagnosis of SARS-CoV-2 by FDA under an Emergency Use Authorization (EUA). This EUA will remain  in effect (meaning this test can be used) for the duration of the COVID-19 declaration under Section 56 4(b)(1) of the Act, 21 U.S.C. section 360bbb-3(b)(1), unless the authorization is terminated or revoked sooner.  Performed at [facility], [address], [place], [place] [address]   Body fluid culture     Status: None   Collection Time: 02/02/19 11:45 AM   Specimen: PATH Cytology Peritoneal fluid  Result Value Ref Range Status   Specimen Description   Final    PERITONEAL Performed at [facility], [address] [place], [place] [address]    Special Requests   Final    NONE Performed at [facility], [address] [place], [place] [address]    Gram Stain    Final    RARE WBC PRESENT,BOTH PMN AND MONONUCLEAR NO ORGANISMS SEEN    Culture   Final    NO GROWTH Performed at [facility], [address], [place], [place] [address]    Report Status 02/05/2019 FINAL  Final  Acid Fast Smear (AFB)     Status: None   Collection Time: 02/02/19 11:45 AM   Specimen: PATH Cytology Peritoneal fluid  Result Value Ref Range Status   AFB Specimen Processing Concentration  Final   Acid Fast Smear Negative  Final    Comment: (NOTE) Performed At: [facility] [address] [place], [place] [id number] Jolene Schimke MD FB:[phone]    Source (AFB) PERITONEAL  Final    Comment: Performed at [facility], [address] [place], [place] [address]  Culture, blood (routine x 2)     Status: None (Preliminary result)   Collection Time: 02/03/19  3:05 PM   Specimen: BLOOD  Result Value Ref Range Status   Specimen Description BLOOD BOTTLES DRAWN AEROBIC AND ANAEROBIC  Final   Special Requests Blood Culture adequate volume BLOOD LEFT HAND  Final   Culture   Final    NO GROWTH 2 DAYS Performed at [facility], [address] [place], [place] [address]    Report Status PENDING  Incomplete  Culture, blood (routine x 2)     Status: None (Preliminary result)   Collection Time: 02/03/19  3:16 PM   Specimen: BLOOD  Result Value Ref Range Status   Specimen Description BLOOD Blood Culture adequate volume  Final   Special Requests   Final    BOTTLES DRAWN AEROBIC AND ANAEROBIC BLOOD RIGHT HAND   Culture   Final    NO GROWTH 2 DAYS Performed at [facility], [address], [place], [place] [address]    Report Status PENDING  Incomplete   Studies/Results: Ct Abdomen Pelvis Wo Contrast  Result Date: 02/04/2019 CLINICAL DATA:  Abdominal distention EXAM: CT ABDOMEN AND PELVIS WITHOUT CONTRAST TECHNIQUE: Multidetector CT imaging of the abdomen and pelvis was performed following the standard protocol without IV  contrast. COMPARISON:  None. FINDINGS: Lower chest: Small or moderate right pleural effusion with atelectasis. Postoperative cardiac valves, incompletely covered. Hepatobiliary: History of cirrhosis. No focal liver abnormality.No evidence of biliary obstruction or stone. Pancreas: Unremarkable. Spleen: Unremarkable. Adrenals/Urinary Tract: Negative adrenals. No hydronephrosis or ureteral stone. Tiny left upper pole calculus. Unremarkable bladder. Stomach/Bowel: No obstruction. No evidence of bowel inflammation. Mild left colonic diverticulosis. Vascular/Lymphatic: No acute vascular abnormality. Extensive atherosclerotic calcification no mass or adenopathy. Reproductive:Negative. Other: Small to moderate ascites without loculation. No pneumoperitoneum. Anasarca Musculoskeletal: Advanced lumbar spine degeneration. Severe left hip osteoarthritis with bone-on-bone contact and eccentric subchondral geode formation. 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 IMPRESSION: 1. Small to moderate ascites and right pleural effusion with anasarca. No other explanation for abdominal distention, no bowel  obstruction 2. Aortic Atherosclerosis (ICD10-I70.0) and other chronic findings are described above. Electronically Signed   By: Marnee SpringJonathon  Watts M.D.   On: 02/04/2019 10:50   [place] Liver Doppler  Result Date: 02/05/2019 CLINICAL DATA:  Cirrhosis.  Elevated LFTs. EXAM: DUPLEX ULTRASOUND OF LIVER TECHNIQUE: Color and duplex Doppler ultrasound was performed to evaluate the hepatic in-flow and out-flow vessels. COMPARISON:  Noncontrast CT abdomen pelvis-02/04/2019; ultrasound-guided paracentesis-02/02/2019 yielding 650 cc of peritoneal fluid; chest CTA-08/07/2011 FINDINGS: Liver: There is mild diffuse increased echogenicity of the hepatic parenchyma with mild nodularity hepatic contour. No discrete hepatic lesions. No intrahepatic biliary dilatation. Main Portal Vein size: 1.4 cm Portal Vein Velocities Main  Prox:  29 cm/sec Main Mid: 35 cm/sec Main Dist:  25 cm/sec Right: 24 cm/sec Left: 24 cm/sec Normal hepatopetal directional flow is demonstrated throughout the portal venous system. Hepatic Vein Velocities Right:  28 cm/sec Middle:  27 cm/sec Left:  25 cm/sec Normal hepatofugal directional flow is demonstrated throughout the hepatic venous system. IVC: Present and patent with normal respiratory phasicity. Hepatic Artery Velocity:  37 cm/sec Splenic Vein Velocity:  5.7 cm/sec Spleen: 6.4 cm x 9.7 cm x 4.1 cm with a total volume of 134 cm^3 (411 cm^3 is upper limit normal) Portal Vein Occlusion/Thrombus: No Splenic Vein Occlusion/Thrombus: No Ascites: Small volume intra-abdominal ascites. Note is also made of a trace right-sided pleural effusion (image 15), similar to preceding noncontrast CT scan of the abdomen and pelvis. Varices: None IMPRESSION: 1. Findings suggestive hepatic steatosis with nodularity hepatic contour suggestive of cirrhotic change. No discrete lesions. Further evaluation with nonemergent abdominal MRI could be performed as indicated. 2. Patent hepatic vasculature with normal directional flow. 3. Small volume intra-abdominal ascites and trace right-sided pleural effusion. 4. No evidence of splenomegaly. Electronically Signed   By: Simonne ComeJohn  Watts M.D.   On: 02/05/2019 09:20   Medications:  I have reviewed the patient's current medications. Prior to Admission:  Medications Prior to Admission  Medication Sig Dispense Refill Last Dose  . atorvastatin (LIPITOR) 40 MG tablet Take 40 mg by mouth daily.   Past Week at Unknown time  . azithromycin (ZITHROMAX) 250 MG tablet Take as directed on package. 6 each 0 01/30/2019 at Unknown time  . FLUoxetine (PROZAC) 20 MG capsule Take 20 mg by mouth every morning.   Past Week at Unknown time  . furosemide (LASIX) 40 MG tablet Take 1 tablet (40 mg total) by mouth daily. 30 tablet 5 Past Week at Unknown time  . gabapentin (NEURONTIN) 300 MG capsule Take 300  mg by mouth 3 (three) times daily.   Past Week at Unknown time  . ipratropium (ATROVENT HFA) 17 MCG/ACT inhaler Inhale 2 puffs into the lungs 4 (four) times daily.   Past Week at Unknown time  . meloxicam (MOBIC) 15 MG tablet Take 15 mg by mouth daily.   Past Week at Unknown time  . metoprolol tartrate (LOPRESSOR) 25 MG tablet Take 25 mg by mouth daily.   Past Week at Unknown time  . mirtazapine (REMERON) 15 MG tablet Take 15 mg by mouth at bedtime.   Past Week at Unknown time  . potassium chloride 20 MEQ TBCR Take 20 mEq by mouth daily. 30 tablet 5 Past Week at Unknown time  . acetaminophen (TYLENOL) 500 MG tablet Take 1,000 mg by mouth every 8 (eight) hours.   prn at prn  . albuterol (VENTOLIN HFA) 108 (90 Base) MCG/ACT inhaler Inhale 2 puffs into the lungs every 4 (four) hours as  needed for wheezing or shortness of breath. 6.7 g 0 prn at prn  . aspirin 325 MG tablet Take 325 mg by mouth daily.   Not Taking at Unknown time  . DULoxetine (CYMBALTA) 30 MG capsule Take 30 mg by mouth daily.   Not Taking at Unknown time  . naproxen (NAPROSYN) 500 MG tablet Take 500 mg by mouth 2 (two) times daily with a meal.   Completed Course at Unknown time   Scheduled: . feeding supplement  1 Container Oral TID BM  . fluticasone  2 spray Each Nare Daily  . folic acid  1 mg Oral Daily  . lactulose  20 g Oral Daily  . mouth rinse  15 mL Mouth Rinse BID  . midodrine  5 mg Oral TID WC  . multivitamin with minerals  1 tablet Oral Daily  . pneumococcal 23 valent vaccine  0.5 mL Intramuscular Tomorrow-1000  . polyethylene glycol  17 g Oral Daily  . sodium chloride flush  3 mL Intravenous Q12H  . thiamine  100 mg Oral Daily   Or  . thiamine  100 mg Intravenous Daily   Continuous: . sodium chloride    . acetylcysteine 15 mg/kg/hr (02/05/19 0046)   HGD:JMEQAS chloride, bisacodyl, ipratropium-albuterol, ondansetron (ZOFRAN) IV, sodium chloride, sodium chloride flush Anti-infectives (From admission, onward)    Start     Dose/Rate Route Frequency Ordered Stop   01/31/19 0030  azithromycin (ZITHROMAX) tablet 500 mg  Status:  Discontinued     500 mg Oral Daily 01/31/19 0017 01/31/19 1506     Scheduled Meds: . feeding supplement  1 Container Oral TID BM  . fluticasone  2 spray Each Nare Daily  . folic acid  1 mg Oral Daily  . lactulose  20 g Oral Daily  . mouth rinse  15 mL Mouth Rinse BID  . midodrine  5 mg Oral TID WC  . multivitamin with minerals  1 tablet Oral Daily  . pneumococcal 23 valent vaccine  0.5 mL Intramuscular Tomorrow-1000  . polyethylene glycol  17 g Oral Daily  . sodium chloride flush  3 mL Intravenous Q12H  . thiamine  100 mg Oral Daily   Or  . thiamine  100 mg Intravenous Daily   Continuous Infusions: . sodium chloride    . acetylcysteine 15 mg/kg/hr (02/05/19 0046)   PRN Meds:.sodium chloride, bisacodyl, ipratropium-albuterol, ondansetron (ZOFRAN) IV, sodium chloride, sodium chloride flush   Assessment: Active Problems:   CHF (congestive heart failure) (HCC)  Acute liver failure Acute kidney injury  Plan: Acute liver failure, history of heavy alcohol use, drug abuse LFTs are improving Urine drug screen came back positive for cocaine Likely combination of ischemic liver injury, alcoholic hepatitis and DILI in background of cirrhosis Ultrasound Doppler did not reveal portal vein thrombosis N-acetylcysteine to be stopped at noon on 10/31 Acute viral hepatitis panel negative, Hepatitis B E antigen negative, HSV IgM pending, CMV IgM negative, EBV IgM negative HCV antibody positive, HCV PCR in process Adequate p.o. nutrition, high-protein diet  Continue vitamin K subcutaneous 10 mg daily for 3 days, completed Check LFTs, PT/INR daily Continue lactulose 20 g daily to prevent hepatic encephalopathy  Ascites No evidence of SBP AFB smear negative Cytology negative for malignancy  Acute renal failure Creatinine improving Midodrine is initiated by nephrology   Dr. Allen Norris will cover for the weekend    LOS: 6 days   [name] 02/05/2019, 8:45 PM

## 2019-02-05 NOTE — Plan of Care (Signed)
  Problem: Clinical Measurements: Goal: Ability to maintain clinical measurements within normal limits will improve Outcome: Progressing   Problem: Activity: Goal: Risk for activity intolerance will decrease Outcome: Progressing   Problem: Elimination: Goal: Will not experience complications related to bowel motility Outcome: Progressing   Problem: Pain Managment: Goal: General experience of comfort will improve Outcome: Progressing   Problem: Safety: Goal: Ability to remain free from injury will improve Outcome: Progressing   Problem: Activity: Goal: Capacity to carry out activities will improve Outcome: Progressing   Problem: Cardiac: Goal: Ability to achieve and maintain adequate cardiopulmonary perfusion will improve Outcome: Progressing

## 2019-02-06 DIAGNOSIS — R7989 Other specified abnormal findings of blood chemistry: Secondary | ICD-10-CM | POA: Diagnosis not present

## 2019-02-06 LAB — CBC
HCT: 33.2 % — ABNORMAL LOW (ref 39.0–52.0)
Hemoglobin: 11.3 g/dL — ABNORMAL LOW (ref 13.0–17.0)
MCH: 25.4 pg — ABNORMAL LOW (ref 26.0–34.0)
MCHC: 34 g/dL (ref 30.0–36.0)
MCV: 74.6 fL — ABNORMAL LOW (ref 80.0–100.0)
Platelets: 87 10*3/uL — ABNORMAL LOW (ref 150–400)
RBC: 4.45 MIL/uL (ref 4.22–5.81)
RDW: 17.4 % — ABNORMAL HIGH (ref 11.5–15.5)
WBC: 7 10*3/uL (ref 4.0–10.5)
nRBC: 2.4 % — ABNORMAL HIGH (ref 0.0–0.2)

## 2019-02-06 LAB — COMPREHENSIVE METABOLIC PANEL
ALT: 638 U/L — ABNORMAL HIGH (ref 0–44)
AST: 657 U/L — ABNORMAL HIGH (ref 15–41)
Albumin: 2.8 g/dL — ABNORMAL LOW (ref 3.5–5.0)
Alkaline Phosphatase: 120 U/L (ref 38–126)
Anion gap: 10 (ref 5–15)
BUN: 51 mg/dL — ABNORMAL HIGH (ref 6–20)
CO2: 25 mmol/L (ref 22–32)
Calcium: 8.8 mg/dL — ABNORMAL LOW (ref 8.9–10.3)
Chloride: 103 mmol/L (ref 98–111)
Creatinine, Ser: 1.66 mg/dL — ABNORMAL HIGH (ref 0.61–1.24)
GFR calc Af Amer: 52 mL/min — ABNORMAL LOW (ref >60)
GFR calc non Af Amer: 45 mL/min — ABNORMAL LOW (ref >60)
Glucose, Bld: 110 mg/dL — ABNORMAL HIGH (ref 70–99)
Potassium: 3.3 mmol/L — ABNORMAL LOW (ref 3.5–5.1)
Sodium: 138 mmol/L (ref 135–145)
Total Bilirubin: 1.6 mg/dL — ABNORMAL HIGH (ref 0.3–1.2)
Total Protein: 6.1 g/dL — ABNORMAL LOW (ref 6.5–8.1)

## 2019-02-06 LAB — HCV RNA BY PCR, QN RFX GENO
HCV RNA Qnt(log copy/mL): UNDETERMINED log10 IU/mL
HepC Qn: NOT DETECTED IU/mL

## 2019-02-06 LAB — PROTIME-INR
INR: 2.1 — ABNORMAL HIGH (ref 0.8–1.2)
Prothrombin Time: 23 seconds — ABNORMAL HIGH (ref 11.4–15.2)

## 2019-02-06 MED ORDER — SPIRONOLACTONE 25 MG PO TABS
25.0000 mg | ORAL_TABLET | ORAL | Status: DC
  Administered 2019-02-06: 25 mg via ORAL
  Filled 2019-02-06: qty 1

## 2019-02-06 NOTE — Progress Notes (Signed)
Occupational Therapy Treatment Patient Details Name: LENWARD ABLE MRN: [id number] DOB: 1960-08-10 Today's Date: 02/06/2019    History of present illness 58 yo male with family assistance has been admitted for SOB and new use of 2L O2 with pulm edema and pleural effusion.  Has been diuresed for wgt gain from chronic CHF.  Has AKI, current tobacco use, acute COPD exacerbation.  PMHx: bipolar, hip pain with walker needed, CHF, COPD, HTN, HLD,    OT comments  Pt doing very well in session with OT on room air. O2 sats stayed above 94 - needed some rest breaks and cueing for purse lip breathing. Pt independent in functional mobility in room , to bathroom and sink - managing door with no LOB. Pt SBA with verbal cues for LB dressing because of fatigue and needing rest breaks but also L hip pain. Pain 6/10 with sitting and mobility . Reviewed with pt energy conservation and AE with use at home -and hand out provided.  Pt can cont to benefit from OT services.  Follow Up Recommendations    HHOT and PT    Equipment Recommendations       Recommendations for Other Services      Precautions / Restrictions         Mobility Bed Mobility                  Transfers                      Balance                                           ADL either performed or assessed with clinical judgement   ADL                                               Vision       Perception     Praxis      Cognition                                                Exercises Other Exercises Other Exercises: Pt I with S in functional mobility in room to bathroom and sink - now LOB with opening and closer door Other Exercises: Pt ed on energy conservation principles - hand out provided -and discuss options for railings for bathroom tub.shower - and safety Other Exercises: Pt done some sit<> stand , side stepping at counter at sink and  marching - pt can do on his own - but pace himself -and purse lip breathing edcuation provided - O2 sats stayed above 94 during session on room air   Shoulder Instructions       General Comments      Pertinent Vitals/ Pain          Home Living                                          Prior Functioning/Environment  Frequency           Progress Toward Goals  OT Goals(current goals can now be found in the care plan section)  Progress towards OT goals: Progressing toward goals  Acute Rehab OT Goals Patient Stated Goal: go home OT Goal Formulation: With patient Time For Goal Achievement: 02/15/19 Potential to Achieve Goals: Good  Plan      Co-evaluation                 AM-PAC OT "6 Clicks" Daily Activity     Outcome Measure                    End of Session        Activity Tolerance     Patient Left     Nurse Communication          Time: 0973-5329 OT Time Calculation (min): 31 min  Charges: OT General Charges $OT Visit: 1 Visit OT Treatments $Self Care/Home Management : 23-37 mins     Allure Greaser OTR/L,CLT 02/06/2019, 1:20 PM

## 2019-02-06 NOTE — Plan of Care (Signed)
  Problem: Clinical Measurements: Goal: Ability to maintain clinical measurements within normal limits will improve Outcome: Progressing Goal: Diagnostic test results will improve Outcome: Progressing   

## 2019-02-06 NOTE — Progress Notes (Signed)
[facility] [place], [place] 02/06/19  Subjective:   Patient reports congestion in his head and sinuses has improved States appetite is improving slowly. Serum creatinine continuing to improve   Objective:  Vital signs in last 24 hours:  Temp:  [97.5 F (36.4 C)-98.3 F (36.8 C)] 97.5 F (36.4 C) (10/31 0751) Pulse Rate:  [72-77] 77 (10/31 0751) Resp:  [19] 19 (10/31 0751) BP: (128-134)/(49-85) 134/85 (10/31 0751) SpO2:  [96 %-100 %] 96 % (10/31 0751) Weight:  [74.6 kg] 74.6 kg (10/31 0456)  Weight change: -0.689 kg Filed Weights   02/04/19 0424 02/05/19 0347 02/06/19 0456  Weight: 75.9 kg 75.3 kg 74.6 kg    Intake/Output:    Intake/Output Summary (Last 24 hours) at 02/06/2019 1119 Last data filed at 02/06/2019 1105 Gross per 24 hour  Intake 228.88 ml  Output 1450 ml  Net -1221.12 ml   Physical Exam: General:  No acute distress, laying in the bed  HEENT  anicteric, moist oral mucous membranes  Lungs:  Normal breathing effort, room air, mild rhonchi  Heart::   Irregular rhythm, prominent systolic murmur  Abdomen:  Soft, nontender  Extremities:  + peripheral edema  Neurologic:  Alert, oriented  Skin:  no acute rashes     Basic Metabolic Panel:  Recent Labs  Lab 02/01/19 2323 02/02/19 0525 02/03/19 0527 02/04/19 0547 02/05/19 0623 02/06/19 0611  NA 136 136 135 134* 134* 138  K 5.9* 5.3* 5.5* 4.3 3.9 3.3*  CL 99 101 101 98 100 103  CO2 22 24 21* 20* 20* 25  GLUCOSE 91 104* 135* 93 106* 110*  BUN 54* 62* 82* 93* 82* 51*  CREATININE 3.17* 3.51* 4.67* 4.91* 3.15* 1.66*  CALCIUM 8.8* 8.8* 8.3* 8.2* 8.8* 8.8*  MG 2.2  --   --   --   --   --   PHOS 7.4*  --   --   --   --   --      CBC: Recent Labs  Lab 01/30/19 1600 02/01/19 2323 02/02/19 0525 02/04/19 0547 02/05/19 0623 02/06/19 0611  WBC 7.1 12.3* 10.7* 9.8 7.9 7.0  NEUTROABS 4.5  --  7.7  --   --   --   HGB 12.0* 12.9* 13.1 12.2* 11.8* 11.3*  HCT 37.3* 40.5 40.2 36.7*  34.8* 33.2*  MCV 80.4 80.7 78.8* 76.9* 75.5* 74.6*  PLT 216 207 195 93* 88* 87*      Lab Results  Component Value Date   HEPBSAG NON REACTIVE 02/02/2019      Microbiology:  Recent Results (from the past 240 hour(s))  SARS CORONAVIRUS 2 (TAT 6-24 HRS) Nasopharyngeal Nasopharyngeal Swab     Status: None   Collection Time: 01/30/19  4:00 PM   Specimen: Nasopharyngeal Swab  Result Value Ref Range Status   SARS Coronavirus 2 NEGATIVE NEGATIVE Final    Comment: (NOTE) SARS-CoV-2 target nucleic acids are NOT DETECTED. The SARS-CoV-2 RNA is generally detectable in upper and lower respiratory specimens during the acute phase of infection. Negative results do not preclude SARS-CoV-2 infection, do not rule out co-infections with other pathogens, and should not be used as the sole basis for treatment or other patient management decisions. Negative results must be combined with clinical observations, patient history, and epidemiological information. The expected result is Negative. Fact Sheet for Patients: HairSlick.nohttps://www.fda.gov/media/138098/download Fact Sheet for Healthcare Providers: quierodirigir.comhttps://www.fda.gov/media/138095/download This test is not yet approved or cleared by the [place] FDA and  has been authorized for detection  and/or diagnosis of SARS-CoV-2 by FDA under an Emergency Use Authorization (EUA). This EUA will remain  in effect (meaning this test can be used) for the duration of the COVID-19 declaration under Section 56 4(b)(1) of the Act, 21 U.S.C. section 360bbb-3(b)(1), unless the authorization is terminated or revoked sooner. Performed at [facility], [place] [address] [place], [place] [address]   Body fluid culture     Status: None   Collection Time: 02/02/19 11:45 AM   Specimen: PATH Cytology Peritoneal fluid  Result Value Ref Range Status   Specimen Description   Final    PERITONEAL Performed at [facility], [address] [place],  [place] [address]    Special Requests   Final    NONE Performed at [facility], [address] [place], [place] [address]    Gram Stain   Final    RARE WBC PRESENT,BOTH PMN AND MONONUCLEAR NO ORGANISMS SEEN    Culture   Final    NO GROWTH Performed at [facility], [place] [address], [place], [place] [address]    Report Status 02/05/2019 FINAL  Final  Acid Fast Smear (AFB)     Status: None   Collection Time: 02/02/19 11:45 AM   Specimen: PATH Cytology Peritoneal fluid  Result Value Ref Range Status   AFB Specimen Processing Concentration  Final   Acid Fast Smear Negative  Final    Comment: (NOTE) Performed At: [facility] [address] [place], [place] [id number] Rush Farmer MD TD:[id number]    Source (AFB) PERITONEAL  Final    Comment: Performed at [facility], [address] [place], [place] [address]  Culture, blood (routine x 2)     Status: None (Preliminary result)   Collection Time: 02/03/19  3:05 PM   Specimen: BLOOD  Result Value Ref Range Status   Specimen Description BLOOD BOTTLES DRAWN AEROBIC AND ANAEROBIC  Final   Special Requests Blood Culture adequate volume BLOOD LEFT HAND  Final   Culture   Final    NO GROWTH 3 DAYS Performed at [facility], [address], [place], [place] [address]    Report Status PENDING  Incomplete  Culture, blood (routine x 2)     Status: None (Preliminary result)   Collection Time: 02/03/19  3:16 PM   Specimen: BLOOD  Result Value Ref Range Status   Specimen Description BLOOD Blood Culture adequate volume  Final   Special Requests   Final    BOTTLES DRAWN AEROBIC AND ANAEROBIC BLOOD RIGHT HAND   Culture   Final    NO GROWTH 3 DAYS Performed at [facility], [address], [place], [place] [address]    Report Status PENDING  Incomplete    Coagulation Studies: Recent Labs    02/04/19 0547 02/05/19 0623 02/06/19 0611  LABPROT 32.5* 26.7* 23.0*  INR 3.2* 2.5* 2.1*     Urinalysis: Recent Labs    02/03/19 1515  COLORURINE YELLOW*  LABSPEC 1.017  PHURINE 5.0  GLUCOSEU NEGATIVE  HGBUR NEGATIVE  BILIRUBINUR NEGATIVE  KETONESUR 5*  PROTEINUR NEGATIVE  NITRITE NEGATIVE  LEUKOCYTESUR NEGATIVE      Imaging: US Liver Doppler  Result Date: 02/05/2019 CLINICAL DATA:  Cirrhosis.  Elevated LFTs. EXAM: DUPLEX ULTRASOUND OF LIVER TECHNIQUE: Color and duplex Doppler ultrasound was performed to evaluate the hepatic in-flow and out-flow vessels. COMPARISON:  Noncontrast CT abdomen pelvis-02/04/2019; ultrasound-guided paracentesis-02/02/2019 yielding 650 cc of peritoneal fluid; chest CTA-08/07/2011 FINDINGS: Liver: There  is mild diffuse increased echogenicity of the hepatic parenchyma with mild nodularity hepatic contour. No discrete hepatic lesions. No intrahepatic biliary dilatation. Main Portal Vein size: 1.4 cm Portal Vein Velocities Main Prox:  29 cm/sec Main Mid: 35 cm/sec Main Dist:  25 cm/sec Right: 24 cm/sec Left: 24 cm/sec Normal hepatopetal directional flow is demonstrated throughout the portal venous system. Hepatic Vein Velocities Right:  28 cm/sec Middle:  27 cm/sec Left:  25 cm/sec Normal hepatofugal directional flow is demonstrated throughout the hepatic venous system. IVC: Present and patent with normal respiratory phasicity. Hepatic Artery Velocity:  37 cm/sec Splenic Vein Velocity:  5.7 cm/sec Spleen: 6.4 cm x 9.7 cm x 4.1 cm with a total volume of 134 cm^3 (411 cm^3 is upper limit normal) Portal Vein Occlusion/Thrombus: No Splenic Vein Occlusion/Thrombus: No Ascites: Small volume intra-abdominal ascites. Note is also made of a trace right-sided pleural effusion (image 15), similar to preceding noncontrast CT scan of the abdomen and pelvis. Varices: None IMPRESSION: 1. Findings suggestive hepatic steatosis with nodularity hepatic contour suggestive of cirrhotic change. No discrete lesions. Further evaluation with nonemergent abdominal MRI could be  performed as indicated. 2. Patent hepatic vasculature with normal directional flow. 3. Small volume intra-abdominal ascites and trace right-sided pleural effusion. 4. No evidence of splenomegaly. Electronically Signed   By: Simonne Come M.D.   On: 02/05/2019 09:20     Medications:   . sodium chloride    . acetylcysteine 15 mg/kg/hr (02/05/19 2255)   . feeding supplement  1 Container Oral TID BM  . fluticasone  2 spray Each Nare Daily  . folic acid  1 mg Oral Daily  . lactulose  20 g Oral Daily  . mouth rinse  15 mL Mouth Rinse BID  . midodrine  5 mg Oral TID WC  . multivitamin with minerals  1 tablet Oral Daily  . pneumococcal 23 valent vaccine  0.5 mL Intramuscular Tomorrow-1000  . polyethylene glycol  17 g Oral Daily  . sodium chloride flush  3 mL Intravenous Q12H  . thiamine  100 mg Oral Daily   Or  . thiamine  100 mg Intravenous Daily   sodium chloride, bisacodyl, ipratropium-albuterol, morphine injection, ondansetron (ZOFRAN) IV, sodium chloride, sodium chloride flush, traMADol  Assessment/ Plan:  58 y.o.African American male  with history of bipolar disorder, diabetes, hypertension, COPD, aortic valve replacement on 1990 and aortic and mitral valve repair in 2000 at [facility], history of hepatitis C treated with Harvoni at [facility] 4 years ago,   was admitted on 01/30/2019 with worsening edema and ascites.   Active problems: Cirrhosis, pulmonary hypertension,moderate mitral regurgitation, calcification of mitral valve, dilated right atrium and left atrium,With EF of 50 to 55% and dilated cardiomyopathy Moderately reduced LV function, diastolic dysfunction, acute hepatitis   # Acute kidney injury -Differential diagnosis includes cardiorenal syndrome, prerenal and ATN leading to AKI from aggressive diuresis -Avoid hypotension.    Continue midodrine 5 mg 3 times daily -Avoid nephrotoxins, NSAIDs, IV contrast   - Hold further iv fluid supplementation -Renal function continues to  improve  # Hyperkalemia, now hypokalemia Lab Results  Component Value Date   K 3.3 (L) 02/06/2019   -discontinue Lokelma -Follow low-salt diet -We will add low-dose spironolactone  # GI - Cirrhosis, alcoholic, Acute hepatitis , coagulopathy -Avoid substance abuse -management as per GI team    LOS: 7 Mikell Camp 10/31/202011:19 AM  [facility] [place], [place] [phone]  Note: This note was prepared with Dragon dictation. Any  transcription errors are unintentional

## 2019-02-06 NOTE — Plan of Care (Signed)

## 2019-02-06 NOTE — Progress Notes (Signed)
[name] Rod Majerus, MD [facility]   [address] [place], [place] [address] Phone: [phone] Fax : [phone]   Subjective: The patient has no complaints today.  He states that his abdomen is not hurting anymore.  There is no report of any issues overnight.  The patient's liver enzymes are continuing to decrease.   Objective: Vital signs in last 24 hours: Vitals:   02/05/19 1507 02/05/19 2047 02/06/19 0456 02/06/19 0751  BP: 130/82 (!) 129/50 (!) 128/49 134/85  Pulse: 72 77 77 77  Resp: 19   19  Temp: 98.3 F (36.8 C) 98.3 F (36.8 C) 97.6 F (36.4 C) (!) 97.5 F (36.4 C)  TempSrc:  Oral Oral Oral  SpO2: 97% 100% 96% 96%  Weight:   74.6 kg   Height:       Weight change: -0.689 kg  Intake/Output Summary (Last 24 hours) at 02/06/2019 1451 Last data filed at 02/06/2019 1105 Gross per 24 hour  Intake 228.88 ml  Output 1450 ml  Net -1221.12 ml     Exam: Heart:: Regular rate and rhythm, S1S2 present or without murmur or extra heart sounds Lungs: normal and clear to auscultation and percussion Abdomen: soft, nontender, normal bowel sounds   Lab Results: @LABTEST2 @ Micro Results: Recent Results (from the past 240 hour(s))  SARS CORONAVIRUS 2 (TAT 6-24 HRS) Nasopharyngeal Nasopharyngeal Swab     Status: None   Collection Time: 01/30/19  4:00 PM   Specimen: Nasopharyngeal Swab  Result Value Ref Range Status   SARS Coronavirus 2 NEGATIVE NEGATIVE Final    Comment: (NOTE) SARS-CoV-2 target nucleic acids are NOT DETECTED. The SARS-CoV-2 RNA is generally detectable in upper and lower respiratory specimens during the acute phase of infection. Negative results do not preclude SARS-CoV-2 infection, do not rule out co-infections with other pathogens, and should not be used as the sole basis for treatment or other patient management decisions. Negative results must be combined with clinical observations, patient history, and epidemiological information. The expected  result is Negative. Fact Sheet for Patients: HairSlick.nohttps://www.fda.gov/media/138098/download Fact Sheet for Healthcare Providers: quierodirigir.comhttps://www.fda.gov/media/138095/download This test is not yet approved or cleared by the [place] FDA and  has been authorized for detection and/or diagnosis of SARS-CoV-2 by FDA under an Emergency Use Authorization (EUA). This EUA will remain  in effect (meaning this test can be used) for the duration of the COVID-19 declaration under Section 56 4(b)(1) of the Act, 21 U.S.C. section 360bbb-3(b)(1), unless the authorization is terminated or revoked sooner. Performed at [facility], [address] [place], [place] [address]   Body fluid culture     Status: None   Collection Time: 02/02/19 11:45 AM   Specimen: PATH Cytology Peritoneal fluid  Result Value Ref Range Status   Specimen Description   Final    PERITONEAL Performed at [facility], [address] [place], [place] [address]    Special Requests   Final    NONE Performed at [facility], [address] [place], [place] [address]    Gram Stain   Final    RARE WBC PRESENT,BOTH PMN AND MONONUCLEAR NO ORGANISMS SEEN    Culture   Final    NO GROWTH Performed at [facility], [address] [place], [place] [address]    Report Status 02/05/2019 FINAL  Final  Acid Fast Smear (AFB)     Status: None   Collection Time: 02/02/19 11:45 AM   Specimen: PATH Cytology Peritoneal fluid  Result Value Ref Range Status   AFB Specimen Processing Concentration  Final   Acid Fast Smear Negative  Final    Comment: (NOTE) Performed At: [facility] [address] [place], [place] [id number] Jolene Schimke MD PX:[id number]    Source (AFB) PERITONEAL  Final    Comment: Performed at [facility], [address] [place], [place] [address]  Culture, blood (routine x 2)     Status: None (Preliminary result)   Collection Time: 02/03/19  3:05 PM   Specimen:  BLOOD  Result Value Ref Range Status   Specimen Description BLOOD BOTTLES DRAWN AEROBIC AND ANAEROBIC  Final   Special Requests Blood Culture adequate volume BLOOD LEFT HAND  Final   Culture   Final    NO GROWTH 3 DAYS Performed at [facility], [address], [place], [place] [address]    Report Status PENDING  Incomplete  Culture, blood (routine x 2)     Status: None (Preliminary result)   Collection Time: 02/03/19  3:16 PM   Specimen: BLOOD  Result Value Ref Range Status   Specimen Description BLOOD Blood Culture adequate volume  Final   Special Requests   Final    BOTTLES DRAWN AEROBIC AND ANAEROBIC BLOOD RIGHT HAND   Culture   Final    NO GROWTH 3 DAYS Performed at [facility], [address] [place], [place] [address]    Report Status PENDING  Incomplete   Studies/Results: US Liver Doppler  Result Date: 02/05/2019 CLINICAL DATA:  Cirrhosis.  Elevated LFTs. EXAM: DUPLEX ULTRASOUND OF LIVER TECHNIQUE: Color and duplex Doppler ultrasound was performed to evaluate the hepatic in-flow and out-flow vessels. COMPARISON:  Noncontrast CT abdomen pelvis-02/04/2019; ultrasound-guided paracentesis-02/02/2019 yielding 650 cc of peritoneal fluid; chest CTA-08/07/2011 FINDINGS: Liver: There is mild diffuse increased echogenicity of the hepatic parenchyma with mild nodularity hepatic contour. No discrete hepatic lesions. No intrahepatic biliary dilatation. Main Portal Vein size: 1.4 cm Portal Vein Velocities Main Prox:  29 cm/sec Main Mid: 35 cm/sec Main Dist:  25 cm/sec Right: 24 cm/sec Left: 24 cm/sec Normal hepatopetal directional flow is demonstrated throughout the portal venous system. Hepatic Vein Velocities Right:  28 cm/sec Middle:  27 cm/sec Left:  25 cm/sec Normal hepatofugal directional flow is demonstrated throughout the hepatic venous system. IVC: Present and patent with normal respiratory phasicity. Hepatic Artery Velocity:  37 cm/sec Splenic Vein Velocity:  5.7  cm/sec Spleen: 6.4 cm x 9.7 cm x 4.1 cm with a total volume of 134 cm^3 (411 cm^3 is upper limit normal) Portal Vein Occlusion/Thrombus: No Splenic Vein Occlusion/Thrombus: No Ascites: Small volume intra-abdominal ascites. Note is also made of a trace right-sided pleural effusion (image 15), similar to preceding noncontrast CT scan of the abdomen and pelvis. Varices: None IMPRESSION: 1. Findings suggestive hepatic steatosis with nodularity hepatic contour suggestive of cirrhotic change. No discrete lesions. Further evaluation with nonemergent abdominal MRI could be performed as indicated. 2. Patent hepatic vasculature with normal directional flow. 3. Small volume intra-abdominal ascites and trace right-sided pleural effusion. 4. No evidence of splenomegaly. Electronically Signed   By: Simonne Come M.D.   On: 02/05/2019 09:20   Medications: I have reviewed the patient's current medications. Scheduled Meds: . feeding supplement  1 Container Oral TID BM  . fluticasone  2 spray Each Nare Daily  . folic acid  1 mg Oral Daily  . lactulose  20 g Oral Daily  . mouth rinse  15 mL Mouth Rinse BID  .  midodrine  5 mg Oral TID WC  . multivitamin with minerals  1 tablet Oral Daily  . pneumococcal 23 valent vaccine  0.5 mL Intramuscular Tomorrow-1000  . polyethylene glycol  17 g Oral Daily  . sodium chloride flush  3 mL Intravenous Q12H  . spironolactone  25 mg Oral QODAY  . thiamine  100 mg Oral Daily   Or  . thiamine  100 mg Intravenous Daily   Continuous Infusions: . sodium chloride     PRN Meds:.sodium chloride, bisacodyl, ipratropium-albuterol, morphine injection, ondansetron (ZOFRAN) IV, sodium chloride, sodium chloride flush, traMADol   Assessment: Active Problems:   CHF (congestive heart failure) ([place])    Plan: This patient had acute hepatitis most likely from multifactorial cause such as his cocaine use and alcohol abuse.  The patient should continue to receive conservative therapy.  The  patient has been explained that he needs to stop doing things that are detrimental to his health and has been told to avoid any further drug or alcohol abuse.  Nothing further to do from a GI point of view except follow along with you while the liver enzymes come down.   LOS: 7 days   Lucilla Lame 02/06/2019, 2:51 PM Pager [phone] 7am-5pm  Check AMION for 5pm -7am coverage and on weekends

## 2019-02-06 NOTE — Progress Notes (Signed)
Patient ID: [name], male   DOB: 02/28/1961, 58 y.o.   MRN: [id number]  [facility] PROGRESS NOTE  [name] [id number] DOB: 08/12/1960 DOA: 01/30/2019 PCP: Medicine, [employer]  HPI/Subjective: No new complaints, continues to improve Objective: Vitals:   02/06/19 0456 02/06/19 0751  BP: (!) 128/49 134/85  Pulse: 77 77  Resp:  19  Temp: 97.6 F (36.4 C) (!) 97.5 F (36.4 C)  SpO2: 96% 96%    Intake/Output Summary (Last 24 hours) at 02/06/2019 1334 Last data filed at 02/06/2019 1105 Gross per 24 hour  Intake 228.88 ml  Output 1450 ml  Net -1221.12 ml   Filed Weights   02/04/19 0424 02/05/19 0347 02/06/19 0456  Weight: 75.9 kg 75.3 kg 74.6 kg    ROS: Review of Systems  Constitutional: Negative for chills and fever.  Eyes: Negative for blurred vision.  Respiratory: Positive for cough and shortness of breath.   Cardiovascular: Negative for chest pain.  Gastrointestinal: Positive for abdominal pain. Negative for constipation, diarrhea, nausea and vomiting.  Genitourinary: Negative for dysuria.  Musculoskeletal: Negative for joint pain.  Neurological: Negative for dizziness and headaches.   Exam: Physical Exam  Constitutional: He is oriented to person, place, and time.  HENT:  Nose: No mucosal edema.  Mouth/Throat: No oropharyngeal exudate or posterior oropharyngeal edema.  Eyes: Pupils are equal, round, and reactive to light. Conjunctivae, EOM and lids are normal.  Neck: No JVD present. Carotid bruit is not present. No edema present. No thyroid mass and no thyromegaly present.  Cardiovascular: S1 normal and S2 normal. Exam reveals no gallop.  Murmur heard.  Systolic murmur is present with a grade of 3/6. Respiratory: No respiratory distress. He has decreased breath sounds in the right lower field and the left lower field. He has no wheezes. He has no rhonchi. He has no rales.  GI: Soft. Bowel sounds are normal. He exhibits no distension. There  is no abdominal tenderness.  Musculoskeletal:     Right ankle: He exhibits swelling.     Left ankle: He exhibits swelling.  Lymphadenopathy:    He has no cervical adenopathy.  Neurological: He is alert and oriented to person, place, and time. No cranial nerve deficit.  Skin: Skin is warm. No rash noted. Nails show no clubbing.  Psychiatric: He has a normal mood and affect.      Data Reviewed: Basic Metabolic Panel: Recent Labs  Lab 02/01/19 2323 02/02/19 0525 02/03/19 0527 02/04/19 0547 02/05/19 0623 02/06/19 0611  NA 136 136 135 134* 134* 138  K 5.9* 5.3* 5.5* 4.3 3.9 3.3*  CL 99 101 101 98 100 103  CO2 22 24 21* 20* 20* 25  GLUCOSE 91 104* 135* 93 106* 110*  BUN 54* 62* 82* 93* 82* 51*  CREATININE 3.17* 3.51* 4.67* 4.91* 3.15* 1.66*  CALCIUM 8.8* 8.8* 8.3* 8.2* 8.8* 8.8*  MG 2.2  --   --   --   --   --   PHOS 7.4*  --   --   --   --   --    Liver Function Tests: Recent Labs  Lab 02/02/19 0525 02/03/19 0527 02/04/19 0547 02/05/19 0623 02/06/19 0611  AST 1,318* 2,174* 1,758* 1,057* 657*  ALT 624* 923* 1,037* 845* 638*  ALKPHOS 149* 130* 132* 130* 120  BILITOT 2.2* 1.9* 1.6* 1.6* 1.6*  PROT 6.6 6.3* 6.3* 6.3* 6.1*  ALBUMIN 3.1* 3.2* 3.0* 3.1* 2.8*   CBC: Recent Labs  Lab 01/30/19  1600 02/01/19 2323 02/02/19 0525 02/04/19 0547 02/05/19 0623 02/06/19 0611  WBC 7.1 12.3* 10.7* 9.8 7.9 7.0  NEUTROABS 4.5  --  7.7  --   --   --   HGB 12.0* 12.9* 13.1 12.2* 11.8* 11.3*  HCT 37.3* 40.5 40.2 36.7* 34.8* 33.2*  MCV 80.4 80.7 78.8* 76.9* 75.5* 74.6*  PLT 216 207 195 93* 88* 87*   BNP (last 3 results) Recent Labs    01/06/19 1922 01/30/19 1600  BNP 1,112.0* 1,234.0*     Studies: US Liver Doppler  Result Date: 02/05/2019 CLINICAL DATA:  Cirrhosis.  Elevated LFTs. EXAM: DUPLEX ULTRASOUND OF LIVER TECHNIQUE: Color and duplex Doppler ultrasound was performed to evaluate the hepatic in-flow and out-flow vessels. COMPARISON:  Noncontrast CT abdomen  pelvis-02/04/2019; ultrasound-guided paracentesis-02/02/2019 yielding 650 cc of peritoneal fluid; chest CTA-08/07/2011 FINDINGS: Liver: There is mild diffuse increased echogenicity of the hepatic parenchyma with mild nodularity hepatic contour. No discrete hepatic lesions. No intrahepatic biliary dilatation. Main Portal Vein size: 1.4 cm Portal Vein Velocities Main Prox:  29 cm/sec Main Mid: 35 cm/sec Main Dist:  25 cm/sec Right: 24 cm/sec Left: 24 cm/sec Normal hepatopetal directional flow is demonstrated throughout the portal venous system. Hepatic Vein Velocities Right:  28 cm/sec Middle:  27 cm/sec Left:  25 cm/sec Normal hepatofugal directional flow is demonstrated throughout the hepatic venous system. IVC: Present and patent with normal respiratory phasicity. Hepatic Artery Velocity:  37 cm/sec Splenic Vein Velocity:  5.7 cm/sec Spleen: 6.4 cm x 9.7 cm x 4.1 cm with a total volume of 134 cm^3 (411 cm^3 is upper limit normal) Portal Vein Occlusion/Thrombus: No Splenic Vein Occlusion/Thrombus: No Ascites: Small volume intra-abdominal ascites. Note is also made of a trace right-sided pleural effusion (image 15), similar to preceding noncontrast CT scan of the abdomen and pelvis. Varices: None IMPRESSION: 1. Findings suggestive hepatic steatosis with nodularity hepatic contour suggestive of cirrhotic change. No discrete lesions. Further evaluation with nonemergent abdominal MRI could be performed as indicated. 2. Patent hepatic vasculature with normal directional flow. 3. Small volume intra-abdominal ascites and trace right-sided pleural effusion. 4. No evidence of splenomegaly. Electronically Signed   By: Simonne Come M.D.   On: 02/05/2019 09:20    Scheduled Meds: . feeding supplement  1 Container Oral TID BM  . fluticasone  2 spray Each Nare Daily  . folic acid  1 mg Oral Daily  . lactulose  20 g Oral Daily  . mouth rinse  15 mL Mouth Rinse BID  . midodrine  5 mg Oral TID WC  . multivitamin with  minerals  1 tablet Oral Daily  . pneumococcal 23 valent vaccine  0.5 mL Intramuscular Tomorrow-1000  . polyethylene glycol  17 g Oral Daily  . sodium chloride flush  3 mL Intravenous Q12H  . spironolactone  25 mg Oral QODAY  . thiamine  100 mg Oral Daily   Or  . thiamine  100 mg Intravenous Daily   Continuous Infusions: . sodium chloride      Assessment/Plan:  1. Acute diastolic congestive heart failure with moderate mitral regurgitation and severe tricuspid regurgitation.  2. Acute hypoxic respiratory failure. wean off oxygen.  Patient does not wear oxygen at home. 3. Pulmonary hypertension seen on echocardiogram.  Pulmonology following, input appreciated  4. abdominal distention from alcoholic liver cirrhosis.  Ultrasound with the cholecystitis with gallbladder wall thickening but no gallstones but patient is reporting abdominal distention .s/p ultrasound-guided paracentesis for symptomatic relief - no SBP. GI requested CT  which confirms ascites but no other patho 5. Hyperlipidemia unspecified on Lipitor 6. Acute on CKD 2: Improving renal function now.  Creatinine 1.66 7.  Acute hepatic failure - thought to be due to cocaine induced ischemia (UDS +) with underlying alcoholic hepatitis & cirrhosis, Ultrasound Doppler did not reveal portal vein thrombosis, LFTs improving now 8. Constipation -resolved Code Status:     Code Status Orders  (From admission, onward)         Start     Ordered   01/30/19 2200  Full code  Continuous     01/30/19 2203        Code Status History    Date Active Date Inactive Code Status Order ID Comments User Context   01/07/2019 0231 01/08/2019 1755 Full Code [id number]  Lance Coon, MD Inpatient   08/07/2011 1543 08/08/2011 1513 Full Code [id number]  Ezequiel Essex, MD ED   Advance Care Planning Activity     Family Communication: Patient did not want me to speak with family. Disposition Plan: In next 1 to 2 days  Consultants:  Cardiology  Time  spent: 35  minutes  Mar [employer]

## 2019-02-07 DIAGNOSIS — R140 Abdominal distension (gaseous): Secondary | ICD-10-CM

## 2019-02-07 DIAGNOSIS — N179 Acute kidney failure, unspecified: Secondary | ICD-10-CM

## 2019-02-07 LAB — COMPREHENSIVE METABOLIC PANEL
ALT: 495 U/L — ABNORMAL HIGH (ref 0–44)
AST: 449 U/L — ABNORMAL HIGH (ref 15–41)
Albumin: 2.9 g/dL — ABNORMAL LOW (ref 3.5–5.0)
Alkaline Phosphatase: 125 U/L (ref 38–126)
Anion gap: 11 (ref 5–15)
BUN: 28 mg/dL — ABNORMAL HIGH (ref 6–20)
CO2: 25 mmol/L (ref 22–32)
Calcium: 8.6 mg/dL — ABNORMAL LOW (ref 8.9–10.3)
Chloride: 103 mmol/L (ref 98–111)
Creatinine, Ser: 0.91 mg/dL (ref 0.61–1.24)
GFR calc Af Amer: >60 mL/min (ref >60)
GFR calc non Af Amer: >60 mL/min (ref >60)
Glucose, Bld: 97 mg/dL (ref 70–99)
Potassium: 3.5 mmol/L (ref 3.5–5.1)
Sodium: 139 mmol/L (ref 135–145)
Total Bilirubin: 2.2 mg/dL — ABNORMAL HIGH (ref 0.3–1.2)
Total Protein: 6.2 g/dL — ABNORMAL LOW (ref 6.5–8.1)

## 2019-02-07 LAB — CBC
HCT: 33.3 % — ABNORMAL LOW (ref 39.0–52.0)
Hemoglobin: 11.2 g/dL — ABNORMAL LOW (ref 13.0–17.0)
MCH: 25.8 pg — ABNORMAL LOW (ref 26.0–34.0)
MCHC: 33.6 g/dL (ref 30.0–36.0)
MCV: 76.7 fL — ABNORMAL LOW (ref 80.0–100.0)
Platelets: 79 10*3/uL — ABNORMAL LOW (ref 150–400)
RBC: 4.34 MIL/uL (ref 4.22–5.81)
RDW: 17.7 % — ABNORMAL HIGH (ref 11.5–15.5)
WBC: 6.6 10*3/uL (ref 4.0–10.5)
nRBC: 2 % — ABNORMAL HIGH (ref 0.0–0.2)

## 2019-02-07 LAB — PROTIME-INR
INR: 1.7 — ABNORMAL HIGH (ref 0.8–1.2)
Prothrombin Time: 19.5 seconds — ABNORMAL HIGH (ref 11.4–15.2)

## 2019-02-07 MED ORDER — MIRTAZAPINE 15 MG PO TABS: 15.0000 mg | ORAL_TABLET | Freq: Every day | ORAL | Status: DC

## 2019-02-07 NOTE — Plan of Care (Signed)
  Problem: Clinical Measurements: Goal: Diagnostic test results will improve Outcome: Progressing Goal: Respiratory complications will improve Outcome: Progressing Goal: Cardiovascular complication will be avoided Outcome: Progressing   Problem: Activity: Goal: Risk for activity intolerance will decrease Outcome: Progressing   Problem: Coping: Goal: Level of anxiety will decrease Outcome: Progressing   Problem: Elimination: Goal: Will not experience complications related to bowel motility Outcome: Progressing Goal: Will not experience complications related to urinary retention Outcome: Progressing

## 2019-02-07 NOTE — Progress Notes (Signed)
IV team called to restart PIV.Patient requests to hold off to restart PIV for now.RN notified.Will place new consult if needed.

## 2019-02-07 NOTE — Progress Notes (Signed)
Pulmonary Medicine          Date: 02/07/2019,   MRN# [id number] [name] 1961-03-29     AdmissionWeight: 71.7 kg                 CurrentWeight: 74.3 kg      CHIEF COMPLAINT:   Acute hypoxemic respiratory failure and pulmonary hypertension   SUBJECTIVE   Patient sitting up in bed, states he is feeling well.  Lung auscultation significantly improved.  Vitals are stable without need for oxygen  Discussed with hospitalist Dr. Manuella Ghazi plan for DC today.  PAST MEDICAL HISTORY   Past Medical History:  Diagnosis Date   Alcohol abuse    Bipolar 1 disorder (HCC)    CHF (congestive heart failure) (HCC)    COPD (chronic obstructive pulmonary disease) (HCC)    Hip pain    Hypertension    Pulmonary HTN (HCC)    Valvular heart disease      SURGICAL HISTORY   Past Surgical History:  Procedure Laterality Date   AORTIC VALVE REPLACEMENT       FAMILY HISTORY   History reviewed. No pertinent family history.   SOCIAL HISTORY   Social History   Tobacco Use   Smoking status: Current Every Day Smoker    Packs/day: 1.00    Types: Cigarettes   Smokeless tobacco: Never Used  Substance Use Topics   Alcohol use: Yes   Drug use: Yes    Types: Cocaine     MEDICATIONS    Home Medication:    Current Medication:  Current Facility-Administered Medications:    0.9 %  sodium chloride infusion, 250 mL, Intravenous, PRN, [name], [name], NP   bisacodyl (DULCOLAX) EC tablet 10 mg, 10 mg, Oral, Daily PRN, [name], [name], MD   feeding supplement (BOOST / RESOURCE BREEZE) liquid 1 Container, 1 Container, Oral, TID BM, [name], Tally Due, MD, 1 Container at 02/06/19 1000   fluticasone (FLONASE) 50 MCG/ACT nasal spray 2 spray, 2 spray, Each Nare, Daily, [name], [name], MD, 2 spray at 70/26/37 8588   folic acid (FOLVITE) tablet 1 mg, 1 mg, Oral, Daily, [name], [name], NP, 1 mg at 02/07/19 0957   ipratropium-albuterol (DUONEB) 0.5-2.5  (3) MG/3ML nebulizer solution 3 mL, 3 mL, Nebulization, Q6H PRN, Manuella Ghazi, Vipul, MD   lactulose (CHRONULAC) 10 GM/15ML solution 20 g, 20 g, Oral, Daily, [name], Tally Due, MD, 20 g at 02/05/19 2102   MEDLINE mouth rinse, 15 mL, Mouth Rinse, BID, [name], [name], NP, 15 mL at 02/07/19 1009   midodrine (PROAMATINE) tablet 5 mg, 5 mg, Oral, TID WC, [name], [name], MD, 5 mg at 02/07/19 0957   mirtazapine (REMERON) tablet 15 mg, 15 mg, Oral, QHS, Patel, Josetta Huddle, MD   morphine 2 MG/ML injection 1 mg, 1 mg, Intravenous, Q4H PRN, Lance Coon, MD   multivitamin with minerals tablet 1 tablet, 1 tablet, Oral, Daily, [name], [name], NP, 1 tablet at 02/07/19 0957   ondansetron (ZOFRAN) injection 4 mg, 4 mg, Intravenous, Q6H PRN, Lang Snow, NP, 4 mg at 02/02/19 1444   polyethylene glycol (MIRALAX / GLYCOLAX) packet 17 g, 17 g, Oral, Daily, [name], [name], MD, 17 g at 02/07/19 0957   sodium chloride (OCEAN) 0.65 % nasal spray 1 spray, 1 spray, Each Nare, PRN, [name], [name], MD   sodium chloride flush (NS) 0.9 % injection 3 mL, 3 mL, Intravenous, Q12H, [name], [name], NP, 3 mL at 02/06/19 2053   sodium  chloride flush (NS) 0.9 % injection 3 mL, 3 mL, Intravenous, PRN, [name], [name], NP, 3 mL at 02/01/19 1355   spironolactone (ALDACTONE) tablet 25 mg, 25 mg, Oral, QODAY, [name], [name], MD, 25 mg at 02/06/19 1143   thiamine (VITAMIN B-1) tablet 100 mg, 100 mg, Oral, Daily, 100 mg at 02/07/19 0957 **OR** thiamine (B-1) injection 100 mg, 100 mg, Intravenous, Daily, [name], [name], NP   traMADol Janean Sark(ULTRAM) tablet 50 mg, 50 mg, Oral, Q12H PRN, Oralia ManisWillis, David, MD, 50 mg at 02/06/19 2100    ALLERGIES   Patient has no known allergies.     REVIEW OF SYSTEMS    Review of Systems:  Gen:  Denies  fever, sweats, chills weigh loss  HEENT: Denies blurred vision, double vision, ear pain, eye pain, hearing loss, nose bleeds, sore throat Cardiac:   No dizziness, chest pain or heaviness, chest tightness,edema Resp:   Denies cough or sputum porduction, shortness of breath,wheezing, hemoptysis,  Gi: Denies swallowing difficulty, stomach pain, nausea or vomiting, diarrhea, constipation, bowel incontinence Gu:  Denies bladder incontinence, burning urine Ext:   Denies Joint pain, stiffness or swelling Skin: Denies  skin rash, easy bruising or bleeding or hives Endoc:  Denies polyuria, polydipsia , polyphagia or weight change Psych:   Denies depression, insomnia or hallucinations   Other:  All other systems negative   VS: BP 105/70 (BP Location: Left Arm)    Pulse 82    Temp 98.4 F (36.9 C) (Oral)    Resp 19    Ht 5\' 8"  (1.727 m)    Wt 74.3 kg    SpO2 96%    BMI 24.91 kg/m      PHYSICAL EXAM    GENERAL:NAD, no fevers, chills, no weakness no fatigue HEAD: Normocephalic, atraumatic.  EYES: Pupils equal, round, reactive to light. Extraocular muscles intact. No scleral icterus.  MOUTH: Moist mucosal membrane. Dentition intact. No abscess noted.  EAR, NOSE, THROAT: Clear without exudates. No external lesions.  NECK: Supple. No thyromegaly. No nodules. No JVD.  PULMONARY: mild crackles at bases b/l worse from previous CARDIOVASCULAR: S1 and S2. Regular rate and rhythm. No murmurs, rubs, or gallops. No edema. Pedal pulses 2+ bilaterally.  GASTROINTESTINAL: Soft, nontender, nondistended. No masses. Positive bowel sounds. No hepatosplenomegaly.  MUSCULOSKELETAL: No swelling, clubbing, or edema. Range of motion full in all extremities.  NEUROLOGIC: Cranial nerves II through XII are intact. No gross focal neurological deficits. Sensation intact. Reflexes intact.  SKIN: No ulceration, lesions, rashes, or cyanosis. Skin warm and dry. Turgor intact.  PSYCHIATRIC: Mood, affect within normal limits. The patient is awake, alert and oriented x 3. Insight, judgment intact.       IMAGING    Ct Abdomen Pelvis Wo Contrast  Result Date:  02/04/2019 CLINICAL DATA:  Abdominal distention EXAM: CT ABDOMEN AND PELVIS WITHOUT CONTRAST TECHNIQUE: Multidetector CT imaging of the abdomen and pelvis was performed following the standard protocol without IV contrast. COMPARISON:  None. FINDINGS: Lower chest: Small or moderate right pleural effusion with atelectasis. Postoperative cardiac valves, incompletely covered. Hepatobiliary: History of cirrhosis. No focal liver abnormality.No evidence of biliary obstruction or stone. Pancreas: Unremarkable. Spleen: Unremarkable. Adrenals/Urinary Tract: Negative adrenals. No hydronephrosis or ureteral stone. Tiny left upper pole calculus. Unremarkable bladder. Stomach/Bowel: No obstruction. No evidence of bowel inflammation. Mild left colonic diverticulosis. Vascular/Lymphatic: No acute vascular abnormality. Extensive atherosclerotic calcification no mass or adenopathy. Reproductive:Negative. Other: Small to moderate ascites without loculation. No pneumoperitoneum. Anasarca Musculoskeletal: Advanced lumbar spine degeneration. Severe left  hip osteoarthritis with bone-on-bone contact and eccentric subchondral geode formation. 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 IMPRESSION: 1. Small to moderate ascites and right pleural effusion with anasarca. No other explanation for abdominal distention, no bowel obstruction 2. Aortic Atherosclerosis (ICD10-I70.0) and other chronic findings are described above. Electronically Signed   By: Marnee Spring M.D.   On: 02/04/2019 10:50   Dg Chest 2 View  Result Date: 01/21/2019 CLINICAL DATA:  Dyspnea for few weeks with left lower extremity swelling EXAM: CHEST - 2 VIEW COMPARISON:  01/08/2019 chest radiograph. FINDINGS: Intact sternotomy wires. Mitral annuloplasty ring in place. Stable cardiomediastinal silhouette with mild cardiomegaly. No pneumothorax. Small right pleural effusion is increased. No left pleural effusion. Mild pulmonary edema. Patchy right  greater than left bibasilar lung opacities worsened on the right. IMPRESSION: 1. Mild congestive heart failure with increased small right pleural effusion. 2. Patchy bibasilar lung opacities, right greater than left, worsened on the right, favor atelectasis, cannot exclude a component of aspiration or pneumonia. Chest radiograph follow-up advised. Electronically Signed   By: Delbert Phenix M.D.   On: 01/21/2019 17:28   US Renal  Result Date: 02/03/2019 CLINICAL DATA:  Acute renal insufficiency. EXAM: RENAL / URINARY TRACT ULTRASOUND COMPLETE COMPARISON:  None. FINDINGS: Right Kidney: Renal measurements: 11.6 x 5.1 x 5.7 cm = volume: 176 mL. Mild increased cortical echogenicity. No mass or hydronephrosis visualized. Left Kidney: Renal measurements: 12.2 x 5.3 x 5.7 cm = volume: 193 mL. Mild increased cortical echogenicity. No mass or hydronephrosis visualized. Bladder: Appears normal for degree of bladder distention, however not well distended. Other: Small amount of ascites.  Small amount of pleural fluid. IMPRESSION: 1. Normal size kidneys without hydronephrosis. Mild increased cortical echogenicity which can be seen in medical renal disease. 2.  Minimal ascites.  Small amount of pleural fluid. Electronically Signed   By: Elberta Fortis M.D.   On: 02/03/2019 08:13   US Paracentesis  Result Date: 02/02/2019 INDICATION: 58 year old with ascites and abdominal distention. EXAM: ULTRASOUND GUIDED PARACENTESIS MEDICATIONS: None. COMPLICATIONS: None immediate. PROCEDURE: Informed written consent was obtained from the patient after a discussion of the risks, benefits and alternatives to treatment. A timeout was performed prior to the initiation of the procedure. Initial ultrasound scanning demonstrates a small amount of ascites within the right lower abdominal quadrant. The right lower abdomen was prepped and draped in the usual sterile fashion. 1% lidocaine was used for local anesthesia. Following this, a 19 gauge,  7-cm, Yueh catheter was introduced. An ultrasound image was saved for documentation purposes. The paracentesis was performed. The catheter was removed and a dressing was applied. The patient tolerated the procedure well without immediate post procedural complication. FINDINGS: A total of approximately 650 mL of yellow fluid was removed. Samples were sent to the laboratory as requested by the clinical team. IMPRESSION: Successful ultrasound-guided paracentesis yielding 650 mL of peritoneal fluid. Electronically Signed   By: Richarda Overlie M.D.   On: 02/02/2019 14:37   Dg Chest Portable 1 View  Result Date: 01/30/2019 CLINICAL DATA:  Shortness of breath EXAM: PORTABLE CHEST 1 VIEW COMPARISON:  01/21/2019 FINDINGS: No significant interval change in AP portable examination with small right pleural effusion and associated atelectasis or consolidation. The left lung is normally aerated. Cardiomegaly status post median sternotomy. IMPRESSION: 1. No significant interval change in AP portable examination with small right pleural effusion and associated atelectasis or consolidation. The left lung is normally aerated. 2.  Cardiomegaly status post median  sternotomy. Electronically Signed   By: Lauralyn Primes M.D.   On: 01/30/2019 16:41   Dg Abd 2 Views  Result Date: 01/31/2019 CLINICAL DATA:  Abdominal pain and distension EXAM: ABDOMEN - 2 VIEW COMPARISON:  None. FINDINGS: Nonobstructive pattern of bowel gas with gas present to the rectum. Scattered stool present throughout the colon. No free air in the abdomen. Cardiomegaly in the included lower chest, better assessed by dedicated chest radiograph. 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 IMPRESSION: Nonobstructive pattern of bowel gas with gas present to the rectum. Scattered stool present throughout the colon. No free air in the abdomen. Electronically Signed   By: Lauralyn Primes M.D.   On: 01/31/2019 17:32   US Liver  Doppler  Result Date: 02/05/2019 CLINICAL DATA:  Cirrhosis.  Elevated LFTs. EXAM: DUPLEX ULTRASOUND OF LIVER TECHNIQUE: Color and duplex Doppler ultrasound was performed to evaluate the hepatic in-flow and out-flow vessels. COMPARISON:  Noncontrast CT abdomen pelvis-02/04/2019; ultrasound-guided paracentesis-02/02/2019 yielding 650 cc of peritoneal fluid; chest CTA-08/07/2011 FINDINGS: Liver: There is mild diffuse increased echogenicity of the hepatic parenchyma with mild nodularity hepatic contour. No discrete hepatic lesions. No intrahepatic biliary dilatation. Main Portal Vein size: 1.4 cm Portal Vein Velocities Main Prox:  29 cm/sec Main Mid: 35 cm/sec Main Dist:  25 cm/sec Right: 24 cm/sec Left: 24 cm/sec Normal hepatopetal directional flow is demonstrated throughout the portal venous system. Hepatic Vein Velocities Right:  28 cm/sec Middle:  27 cm/sec Left:  25 cm/sec Normal hepatofugal directional flow is demonstrated throughout the hepatic venous system. IVC: Present and patent with normal respiratory phasicity. Hepatic Artery Velocity:  37 cm/sec Splenic Vein Velocity:  5.7 cm/sec Spleen: 6.4 cm x 9.7 cm x 4.1 cm with a total volume of 134 cm^3 (411 cm^3 is upper limit normal) Portal Vein Occlusion/Thrombus: No Splenic Vein Occlusion/Thrombus: No Ascites: Small volume intra-abdominal ascites. Note is also made of a trace right-sided pleural effusion (image 15), similar to preceding noncontrast CT scan of the abdomen and pelvis. Varices: None IMPRESSION: 1. Findings suggestive hepatic steatosis with nodularity hepatic contour suggestive of cirrhotic change. No discrete lesions. Further evaluation with nonemergent abdominal MRI could be performed as indicated. 2. Patent hepatic vasculature with normal directional flow. 3. Small volume intra-abdominal ascites and trace right-sided pleural effusion. 4. No evidence of splenomegaly. Electronically Signed   By: Simonne Come M.D.   On: 02/05/2019 09:20   US  Abdomen Limited Ruq  Result Date: 02/01/2019 CLINICAL DATA:  Cirrhosis and abdominal distension EXAM: ULTRASOUND ABDOMEN LIMITED RIGHT UPPER QUADRANT COMPARISON:  None. FINDINGS: Gallbladder: No gallstones visualized. The gallbladder wall is diffusely thickened measuring up to 0.7 cm. No sonographic Murphy sign noted by sonographer. Common bile duct: Diameter: 0.4 cm Liver: No focal lesion identified. The liver contour is mildly nodular. Within normal limits in parenchymal echogenicity. Portal vein is patent on color Doppler imaging with normal direction of blood flow towards the liver. Other: Mild right upper quadrant ascites. Small right pleural effusion. IMPRESSION: 1. Mildly nodular contour of the liver consistent with history of cirrhosis. Mild right upper quadrant ascites and small pleural effusion visualized. 2. Gallbladder wall thickening which is a nonspecific finding and can be seen in the setting of gallbladder inflammation, cirrhosis, fluid overload states, hypoalbuminemia, among others. No gallstones visualized. Electronically Signed   By: Emmaline Kluver M.D.   On: 02/01/2019 09:11      ASSESSMENT/PLAN   Acute respiratory distress with mild hypoxemia   -Likely due  to fluid overload from continued alcohol abuse and likely liver cirrhosis   -initially improved but now slightly worse then yesterday.    -stopped ativan and remeron - patient states he is no longer dizzy.     Pulmonary arterial hypertension   -Likely group 2 and 3 secondary to cardiac disease and chronic COPD   -Treating underlying cause to optimize cardiac and pulmonary function   -We will need outpatient pulmonary evaluation   Centrilobular emphysema Continue typical COPD care path   Tobacco abuse    -Smoking cessation counseling    -Transdermal nicotine replacement therapy     Thank you Dr. Amado Coe for allowing me to participate in the care of this patient.   Patient/Family are satisfied with care plan  and all questions have been answered.  This document was prepared using Dragon voice recognition software and may include unintentional dictation errors.     Vida Rigger, M.D.  Division of Pulmonary & Critical Care Medicine  [facility]

## 2019-02-07 NOTE — Discharge Instructions (Signed)
Finding Treatment for Addiction Addiction is a complex disease of the brain that causes an uncontrollable (compulsive) need for:  A substance. This includes alcohol, illegal drugs, or prescription medicines, such as painkillers.  An activity or behavior, such as gambling or shopping. Addiction changes the way your brain works. Because of this change:  The need for the medicine, drug, or activity can become so strong that you think about it all the time.  Getting more and more of your addiction becomes the most important thing to you.  You may find yourself leaving other activities and relationships to pursue your addiction.  You can become physically dependent on a substance.  Your health, behavior, emotions, and relationships can change for the worse. How do I know if I need treatment for addiction? Addiction is a progressive disease. Without treatment, addiction can get worse. Living with addiction puts you at higher risk for injury, poor health, loss of employment, loss of money, and even death. You might need treatment for addiction if:  You have tried to stop or cut down, but you have not succeeded.  You find it annoying that your friends and family are concerned about your use or behavior.  You feel guilty about your use or behavior.  You need a particular substance or activity to start your day or to calm down.  You are running out of money because of your addiction.  You have done something illegal to support your addiction.  Your addiction has caused you: ? Health problems. ? Trouble in school, work, home, or with the police. ? To devote all your time to your addiction, and not to other responsibilities. ? To tell lies in order to hide your problem. What types of treatment are available? There may be options for treatment programs and plans based on your addiction, condition, needs, and preferences. No single treatment is right for everyone.  Treatment programs can  be: ? Outpatient. You live at home and go to work or school, but you go to a clinic for treatment. ? Inpatient. You live and sleep at the program facility during treatment.  Programs may include: ? Medicine. You may need medicine to treat the addiction itself, or to treat anxiety or depression. ? Counseling and behavior therapy. This can help individuals and families behave in healthier ways and relate more effectively. ? Support groups. Confidential group therapy, such as a 12-step program, can help individuals and families during treatment and recovery. ? A combination of education, counseling, and a 12-step, spirituality-based approach. What should I consider when selecting a treatment program? Think about your individual requirements when selecting a treatment program. Ask about:  The overall approach to treatment. ? Some programs are strictly 12-step programs. Some have a more flexible approach. ? Programs may differ in length of stay, setting, and size. ? Some programs include your family in your treatment plan. Support may be offered to them throughout the treatment process, as well as instructions for them when you are discharged. ? You may continue to receive support after you have left the program.  The types of medical services that are offered. Find out if the program: ? Offers specific treatment for your particular addiction. ? Meets all of your needs, including physical and cultural needs. ? Includes any medicines you might need. ? Offers mental health counseling as part of your treatment. ? Offers the 12-step meetings at the center, or if transport is available for patients to attend meetings at other locations.  The  cost and types of insurance that are accepted. ? Some programs are sponsored by the government. They support patients who do not have private insurance. ? If you do not have insurance, or if you choose to attend a program that does not accept your insurance,  call the treatment center. Tell them your financial needs and whether a payment plan can be set up. ? There are also organizations that will help you find the resources for treatment. You can find them online by searching "treatment for addiction."  If the program is certified by the appropriate government agency. Where to find support  Your health care provider can help you to find the right treatment. These discussions are confidential.  The [employer] on Alcoholism and Drug Dependence (NCADD). This group has information about treatment centers and programs for people who have an addiction and for family members. ? Call: 1-800-NCA-CALL (408-560-0897). ? Visit the website: https://www.ncadd.org/  The Substance Abuse and Mental Health Services Administration [facility]). This organization will help you find publicly funded treatment centers, help hotlines, and counseling services near you. ? Call: 1-800-662-HELP (908)091-7503). ? Visit the website: www.findtreatment.SamedayNews.com.cy  The National Problem Gambling Helpline. This is a 24-hour confidential helpline for gambling addiction. ? Call: 414-223-7936 ? Visit the website: http://wiggins.com/ In countries outside of the [place] and [place], look in [employer] for contact information for services in your area. Follow these instructions at home:  Find supportive people who will help you stay away from your addiction and stay sober.  Do not use the substance or engage in the activity.  If you have been through treatment: ? Follow your plan. The plan is usually developed by you and your health care provider during treatment. ? Go to meetings with other people in recovery. ? Avoid people, situations, and things that lead you to do the things you are addicted to (triggers). Summary  Addiction changes the way your brain works. These changes cause a desire to repeat and increase the use of the a substance or  behavior.  Addiction is a progressive disease. Without treatment, addiction can get worse. Living with addiction puts you at higher risk for injury, poor health, loss of employment, loss of money, and even death.  There may be options for treatment programs and plans based on your addiction, condition, needs, and preferences. No single treatment is right for everyone.  Your health care provider can help you to find the right treatment. These discussions are confidential. This information is not intended to replace advice given to you by your health care provider. Make sure you discuss any questions you have with your health care provider. Document Released: 02/21/2005 Document Revised: 04/23/2017 Document Reviewed: 04/23/2017 Elsevier Patient Education  2020 [employer].

## 2019-02-07 NOTE — Progress Notes (Signed)
[facility] [place], [place] 02/07/19  Subjective:     Denies any acute complaints Sitting up on the chair.  Expecting discharge today   Objective:  Vital signs in last 24 hours:  Temp:  [97.4 F (36.3 C)-98.4 F (36.9 C)] 98.4 F (36.9 C) (11/01 0717) Pulse Rate:  [77-83] 82 (11/01 0717) Resp:  [18-20] 19 (11/01 0717) BP: (105-118)/(63-80) 105/70 (11/01 0717) SpO2:  [94 %-100 %] 96 % (11/01 0717) Weight:  [74.3 kg] 74.3 kg (11/01 0411)  Weight change: -0.271 kg Filed Weights   02/05/19 0347 02/06/19 0456 02/07/19 0411  Weight: 75.3 kg 74.6 kg 74.3 kg    Intake/Output:    Intake/Output Summary (Last 24 hours) at 02/07/2019 1102 Last data filed at 02/07/2019 1058 Gross per 24 hour  Intake -  Output 1200 ml  Net -1200 ml   Physical Exam: General:  No acute distress,   HEENT  anicteric, moist oral mucous membranes  Lungs:  Normal breathing effort, room air, mild rhonchi  Heart::   Irregular rhythm, prominent systolic murmur  Abdomen:  Soft, nontender  Extremities:   Trace to 1+ peripheral edema  Neurologic:  Alert, oriented  Skin:  no acute rashes     Basic Metabolic Panel:  Recent Labs  Lab 02/01/19 2323  02/03/19 0527 02/04/19 0547 02/05/19 0623 02/06/19 0611 02/07/19 0523  NA 136   < > 135 134* 134* 138 139  K 5.9*   < > 5.5* 4.3 3.9 3.3* 3.5  CL 99   < > 101 98 100 103 103  CO2 22   < > 21* 20* 20* 25 25  GLUCOSE 91   < > 135* 93 106* 110* 97  BUN 54*   < > 82* 93* 82* 51* 28*  CREATININE 3.17*   < > 4.67* 4.91* 3.15* 1.66* 0.91  CALCIUM 8.8*   < > 8.3* 8.2* 8.8* 8.8* 8.6*  MG 2.2  --   --   --   --   --   --   PHOS 7.4*  --   --   --   --   --   --    < > = values in this interval not displayed.     CBC: Recent Labs  Lab 02/02/19 0525 02/04/19 0547 02/05/19 0623 02/06/19 0611 02/07/19 0523  WBC 10.7* 9.8 7.9 7.0 6.6  NEUTROABS 7.7  --   --   --   --   HGB 13.1 12.2* 11.8* 11.3* 11.2*  HCT 40.2 36.7* 34.8* 33.2*  33.3*  MCV 78.8* 76.9* 75.5* 74.6* 76.7*  PLT 195 93* 88* 87* 79*      Lab Results  Component Value Date   HEPBSAG NON REACTIVE 02/02/2019      Microbiology:  Recent Results (from the past 240 hour(s))  SARS CORONAVIRUS 2 (TAT 6-24 HRS) Nasopharyngeal Nasopharyngeal Swab     Status: None   Collection Time: 01/30/19  4:00 PM   Specimen: Nasopharyngeal Swab  Result Value Ref Range Status   SARS Coronavirus 2 NEGATIVE NEGATIVE Final    Comment: (NOTE) SARS-CoV-2 target nucleic acids are NOT DETECTED. The SARS-CoV-2 RNA is generally detectable in upper and lower respiratory specimens during the acute phase of infection. Negative results do not preclude SARS-CoV-2 infection, do not rule out co-infections with other pathogens, and should not be used as the sole basis for treatment or other patient management decisions. Negative results must be combined with clinical observations, patient history, and epidemiological information.  The expected result is Negative. Fact Sheet for Patients: SugarRoll.be Fact Sheet for Healthcare Providers: https://www.woods-mathews.com/ This test is not yet approved or cleared by the [place] FDA and  has been authorized for detection and/or diagnosis of SARS-CoV-2 by FDA under an Emergency Use Authorization (EUA). This EUA will remain  in effect (meaning this test can be used) for the duration of the COVID-19 declaration under Section 56 4(b)(1) of the Act, 21 U.S.C. section 360bbb-3(b)(1), unless the authorization is terminated or revoked sooner. Performed at [facility], [place] [address] [place], [place] [address]   Body fluid culture     Status: None   Collection Time: 02/02/19 11:45 AM   Specimen: PATH Cytology Peritoneal fluid  Result Value Ref Range Status   Specimen Description   Final    PERITONEAL Performed at [facility], [address] [place], [place] [address]     Special Requests   Final    NONE Performed at [facility], [place] [address] [place], [place] [address]    Gram Stain   Final    RARE WBC PRESENT,BOTH PMN AND MONONUCLEAR NO ORGANISMS SEEN    Culture   Final    NO GROWTH Performed at [facility], [place] [address], [place], [place] [address]    Report Status 02/05/2019 FINAL  Final  Acid Fast Smear (AFB)     Status: None   Collection Time: 02/02/19 11:45 AM   Specimen: PATH Cytology Peritoneal fluid  Result Value Ref Range Status   AFB Specimen Processing Concentration  Final   Acid Fast Smear Negative  Final    Comment: (NOTE) Performed At: [facility] [address] [place], [place] [id number] Rush Farmer MD AC:[id number]    Source (AFB) PERITONEAL  Final    Comment: Performed at [facility], [address] [place], [place] [address]  Culture, blood (routine x 2)     Status: None (Preliminary result)   Collection Time: 02/03/19  3:05 PM   Specimen: BLOOD  Result Value Ref Range Status   Specimen Description BLOOD BOTTLES DRAWN AEROBIC AND ANAEROBIC  Final   Special Requests Blood Culture adequate volume BLOOD LEFT HAND  Final   Culture   Final    NO GROWTH 4 DAYS Performed at [facility], [address] [place], [place] [address]    Report Status PENDING  Incomplete  Culture, blood (routine x 2)     Status: None (Preliminary result)   Collection Time: 02/03/19  3:16 PM   Specimen: BLOOD  Result Value Ref Range Status   Specimen Description BLOOD Blood Culture adequate volume  Final   Special Requests   Final    BOTTLES DRAWN AEROBIC AND ANAEROBIC BLOOD RIGHT HAND   Culture   Final    NO GROWTH 4 DAYS Performed at [facility], [address] [place],  [address]    Report Status PENDING  Incomplete    Coagulation Studies: Recent Labs    02/05/19 0623 02/06/19 0611 02/07/19 0523  LABPROT 26.7* 23.0* 19.5*  INR 2.5* 2.1* 1.7*     Urinalysis: No results for input(s): COLORURINE, LABSPEC, PHURINE, GLUCOSEU, HGBUR, BILIRUBINUR, KETONESUR, PROTEINUR, UROBILINOGEN, NITRITE, LEUKOCYTESUR in the last 72 hours.  Invalid input(s): APPERANCEUR    Imaging: No results found.   Medications:   . sodium chloride     . feeding supplement  1 Container Oral TID BM  . fluticasone  2 spray Each Nare Daily  .  folic acid  1 mg Oral Daily  . lactulose  20 g Oral Daily  . mouth rinse  15 mL Mouth Rinse BID  . midodrine  5 mg Oral TID WC  . mirtazapine  15 mg Oral QHS  . multivitamin with minerals  1 tablet Oral Daily  . polyethylene glycol  17 g Oral Daily  . sodium chloride flush  3 mL Intravenous Q12H  . spironolactone  25 mg Oral QODAY  . thiamine  100 mg Oral Daily   Or  . thiamine  100 mg Intravenous Daily   sodium chloride, bisacodyl, ipratropium-albuterol, morphine injection, ondansetron (ZOFRAN) IV, sodium chloride, sodium chloride flush, traMADol  Assessment/ Plan:  57 y.o.African American male  with history of bipolar disorder, diabetes, hypertension, COPD, aortic valve replacement on 1990 and aortic and mitral valve repair in 2000 at [facility], history of hepatitis C treated with Harvoni at [facility] 4 years ago,   was admitted on 01/30/2019 with worsening edema and ascites.   Active problems: Cirrhosis, pulmonary hypertension,moderate mitral regurgitation, calcification of mitral valve, dilated right atrium and left atrium,With EF of 50 to 55% and dilated cardiomyopathy Moderately reduced LV function, diastolic dysfunction, acute hepatitis   # Acute kidney injury -Differential diagnosis includes cardiorenal syndrome, prerenal and ATN leading to AKI from aggressive diuresis -Avoid hypotension.    Continue midodrine 5 mg 3 times daily -Avoid nephrotoxins, NSAIDs, IV contrast   -Renal function has improved significantly.  Creatinine in normal range now.  # Hyperkalemia, now hypokalemia Lab Results  Component Value  Date   K 3.5 02/07/2019   -discontinue Lokelma -Follow low-salt diet -Continue low-dose spironolactone  # GI - Cirrhosis, alcoholic, Acute hepatitis , coagulopathy -Avoid substance abuse -management as per GI team    LOS: 8 [place] 11/1/202011:02 AM  [facility] [place], [place] [phone]  Note: This note was prepared with Dragon dictation. Any transcription errors are unintentional

## 2019-02-08 ENCOUNTER — Other Ambulatory Visit (HOSPITAL_COMMUNITY): Payer: Self-pay

## 2019-02-08 LAB — CULTURE, BLOOD (ROUTINE X 2)
Culture: NO GROWTH
Culture: NO GROWTH
Special Requests: ADEQUATE
Specimen Description: ADEQUATE

## 2019-02-08 NOTE — Progress Notes (Signed)
Patient ID: [name], male    DOB: 08-19-1960, 58 y.o.   MRN: [id number]  HPI  [name] is a 58 y/o male with a history of HTN, COPD, alcohol abuse, bipolar, pulmonary HTN, valvular heart disease, current tobacco use and chronic heart failure.   Echo report from 02/04/2019 reviewed and showed an EF of 40-45% along with moderate/severe Mike/TR, mild AR/PR, mild mitral stenosis and pulmonary HTN. Echo report from 01/07/2019 reviewed and showed an EF of 50-55% along with moderate Mike, mild AR/PR and severe TR along with severely elevated PA pressure.   Admitted 01/30/2019 due to acute on chronic HF. Cardiology, pulmonology, palliative care, GI and nephrology consults were obtained. Initially given IV lasix and then adjusted due to worsening renal function and then transitioned to oral diuretics. Discharged after 8 days. Was in the ED 01/21/2019 due to acute on chronic HF. Was given IV lasix with diuresis >1L. Admitted 01/06/2019 due to pleural effusion and acute on chronic HF. Cardiology consult obtained. Initially given IV lasix and then transitioned to oral diuretics. Discharged after 2 days. Was in the ED 12/28/2018 due to bronchitis where he was treated and released.   He presents today for a follow-up visit with a chief complaint of moderate shortness of breath upon minimal exertion. He describes this as chronic in nature although has been worsening over the last few days. He has associated fatigue, cough, wheezing, pedal edema, abdominal distention, headache, difficulty sleeping and weight gain since hospital discharged 3 days ago. He denies any palpitations or chest pain.   Past Medical History:  Diagnosis Date  . Alcohol abuse   . Bipolar 1 disorder ([place])   . CHF (congestive heart failure) ([place])   . COPD (chronic obstructive pulmonary disease) ([place])   . Hip pain   . Hypertension   . Pulmonary HTN ([place])   . Valvular heart disease    Past Surgical History:  Procedure Laterality Date  .  AORTIC VALVE REPLACEMENT     No family history on file. Social History   Tobacco Use  . Smoking status: Current Every Day Smoker    Packs/day: 1.00    Types: Cigarettes  . Smokeless tobacco: Never Used  Substance Use Topics  . Alcohol use: Yes   No Known Allergies  Prior to Admission medications   Medication Sig Start Date End Date Taking? Authorizing Provider  acetaminophen (TYLENOL) 500 MG tablet Take 1,000 mg by mouth every 8 (eight) hours. 10/30/18 10/30/19 Yes [provider]  albuterol (VENTOLIN HFA) 108 (90 Base) MCG/ACT inhaler Inhale 2 puffs into the lungs every 4 (four) hours as needed for wheezing or shortness of breath. 01/21/19  Yes Duffy Bruce, MD  aspirin EC 81 MG tablet Take 81 mg by mouth daily.   Yes [provider]  atorvastatin (LIPITOR) 40 MG tablet Take 40 mg by mouth daily. 10/30/18 10/30/19 Yes [provider]  azithromycin (ZITHROMAX) 250 MG tablet Take as directed on package. 01/28/19  Yes Hackney, [name] A, FNP  DULoxetine (CYMBALTA) 30 MG capsule Take 30 mg by mouth daily. 09/08/18  Yes [provider]  FLUoxetine (PROZAC) 20 MG capsule Take 20 mg by mouth every morning. 08/26/18  Yes [provider]  furosemide (LASIX) 40 MG tablet Take 1 tablet (40 mg total) by mouth daily. 01/28/19 01/28/20 Yes Hackney, [name] A, FNP  ipratropium (ATROVENT HFA) 17 MCG/ACT inhaler Inhale 2 puffs into the lungs 4 (four) times daily. 01/27/19 01/27/20 Yes [provider]  mirtazapine (REMERON) 15 MG tablet Take 15 mg by mouth at bedtime. 09/08/18 09/08/19 Yes [provider]  naproxen (NAPROSYN) 500 MG tablet Take 500 mg by mouth 2 (two) times daily with a meal.   Yes [provider]  potassium chloride 20 MEQ TBCR Take 20 mEq by mouth daily. 01/28/19  Yes Hackney, Inetta Fermoina A, FNP  gabapentin (NEURONTIN) 300 MG capsule Take 300 mg by mouth 3 (three) times daily. 04/14/18 04/14/19  [provider]  metoprolol  tartrate (LOPRESSOR) 25 MG tablet Take 25 mg by mouth daily. 08/26/18   [provider]      Review of Systems  Constitutional: Positive for fatigue. Negative for appetite change.  HENT: Negative for congestion, rhinorrhea and sore throat.   Eyes: Negative.   Respiratory: Positive for cough, shortness of breath and wheezing.   Cardiovascular: Positive for leg swelling. Negative for chest pain and palpitations.  Gastrointestinal: Positive for abdominal distention. Negative for abdominal pain.  Endocrine: Negative.   Genitourinary: Positive for scrotal swelling (patient reported).  Musculoskeletal: Positive for arthralgias (left hip). Negative for back pain.  Skin: Negative.   Allergic/Immunologic: Negative.   Neurological: Positive for headaches. Negative for dizziness and light-headedness.  Hematological: Negative for adenopathy. Does not bruise/bleed easily.  Psychiatric/Behavioral: Positive for sleep disturbance (+ orthopnea). Negative for dysphoric mood. The patient is nervous/anxious.    Vitals:   02/11/19 0846  BP: 131/74  Pulse: (!) 102  Resp: 16  SpO2: 100%  Weight: 173 lb 9.6 oz (78.7 kg)  Height: 5\' 8"  (1.727 m)   Wt Readings from Last 3 Encounters:  02/11/19 173 lb 9.6 oz (78.7 kg)  02/07/19 163 lb 12.8 oz (74.3 kg)  01/28/19 158 lb (71.7 kg)   Lab Results  Component Value Date   CREATININE 0.91 02/07/2019   CREATININE 1.66 (H) 02/06/2019   CREATININE 3.15 (H) 02/05/2019     Physical Exam Vitals signs and nursing note reviewed.  Constitutional:      Appearance: He is well-developed.  HENT:     Head: Normocephalic and atraumatic.  Neck:     Musculoskeletal: Normal range of motion and neck supple.     Vascular: No JVD.  Cardiovascular:     Rate and Rhythm: Regular rhythm. Tachycardia present.  Pulmonary:     Effort: Pulmonary effort is normal. No respiratory distress.     Breath sounds: Examination of the right-upper field reveals rales.  Examination of the right-lower field reveals rales. Rales present. No wheezing.  Abdominal:     General: There is distension.     Tenderness: There is no abdominal tenderness.  Musculoskeletal:     Right lower leg: He exhibits no tenderness. Edema (3+ pitting to mid thigh) present.     Left lower leg: He exhibits no tenderness. Edema (3+ pitting to mid thigh) present.  Skin:    General: Skin is warm and dry.  Neurological:     General: No focal deficit present.     Mental Status: He is alert and oriented to person, place, and time.  Psychiatric:        Mood and Affect: Mood is anxious.        Behavior: Behavior normal.    Assessment & Plan:  1: Acute on Chronic heart failure with mildly reduced ejection fraction- - NYHA class III - moderately fluid overload with lower extremity edema, abdominal distention and weight gain - weighing daily; reminded to call for an overnight weight gain of >2 pounds or a  weekly weight gain of >5 pounds - weight up 13 pounds from last visit here 2 1/2 weeks ago & 10 pounds up from hospital discharge 3 days ago - will send for 80mg  IV lasix/ PO potassium today - BMP/BNP drawn today - discussed that he may need IV lasix again tomorrow but if symptoms worsen overnight, he is to present to the ER - not adding salt and tries to eat low sodium foods - participating in paramedicine program & have messaged paramedic to update her - BNP 01/30/2019 was 1234.0 - will probably need to change diuretic from furosemide to torsemide in the future  2: COPD/ pulmonary HTN- - pulmonology saw patient during recent admission; "likely group 2 and 3 secondary to cardiac disease and chronic COPD" - has albuterol inhaler for PRN use  3: HTN- - BP looks good today - saw PCP @ [facility] 01/27/2019 & returns 02/25/2019 - BMP from 02/07/2019 reviewed and showed sodium 139, potassium 3.5, creatinine 09.1 and GFR >60  4: Tobacco use- - currently smokes 1 ppd of cigarettes -  complete cessation discussed for 3 minutes with him  5: Valvular disease- - had aortic valve replaced 1990 with repeat replacement in 2000 - saw cardiology (Hinderliter) 10/10/17; instructed to make a follow-up appointment  6: Lymphedema- - stage 2 - he says that he's elevating his legs at night but the edema has worsened - encouraged him to elevate his legs during the day when sitting for long periods of time  - does have compression socks that he's been wearing daily with removal at bedtime with minimal improvement in edema - does do some walking but gets short of breath easily - consider lymphapress compression boots if edema persists  Medication bottles were reviewed.   Return tomorrow for a reevaluation.

## 2019-02-08 NOTE — Progress Notes (Signed)
Today went by Legrand Como to see if he has all his medications from discharge.  He appeared confused this am when spoke to him.  He states took extra lasix last night at bedtime because he was swollen.   Advised him to use caution and let someone know when he takes extra medications.  He says he has all his medications and he knows how to take them.  Swelling is down today and he states he feels better.  He states tired but after hospital stay is normal he states.  Will continue to visit for heart failure.  And I advised him of weight gain of 3 lbs or more overnight or 5 lbs in a week to call.  Advised him to follow a low salt diet and he states he is aware of his up coming appts.    [place] [phone]

## 2019-02-11 ENCOUNTER — Ambulatory Visit: Payer: Medicaid Other | Admitting: Family

## 2019-02-11 ENCOUNTER — Other Ambulatory Visit: Payer: Self-pay

## 2019-02-11 ENCOUNTER — Other Ambulatory Visit: Payer: Self-pay | Admitting: Family

## 2019-02-11 ENCOUNTER — Encounter: Payer: Self-pay | Admitting: Family

## 2019-02-11 ENCOUNTER — Ambulatory Visit
Admission: RE | Admit: 2019-02-11 | Discharge: 2019-02-11 | Disposition: A | Discharge status: Home / Self Care | Payer: Medicaid Other | Source: Ambulatory Visit | Attending: Family | Admitting: Family

## 2019-02-11 VITALS — BP 131/74 | HR 102 | Resp 16 | Ht 68.0 in | Wt 173.6 lb

## 2019-02-11 DIAGNOSIS — Z720 Tobacco use: Secondary | ICD-10-CM

## 2019-02-11 DIAGNOSIS — Z7982 Long term (current) use of aspirin: Secondary | ICD-10-CM | POA: Diagnosis not present

## 2019-02-11 DIAGNOSIS — F17210 Nicotine dependence, cigarettes, uncomplicated: Secondary | ICD-10-CM | POA: Insufficient documentation

## 2019-02-11 DIAGNOSIS — I509 Heart failure, unspecified: Secondary | ICD-10-CM | POA: Diagnosis not present

## 2019-02-11 DIAGNOSIS — I272 Pulmonary hypertension, unspecified: Secondary | ICD-10-CM | POA: Insufficient documentation

## 2019-02-11 DIAGNOSIS — I5043 Acute on chronic combined systolic (congestive) and diastolic (congestive) heart failure: Secondary | ICD-10-CM

## 2019-02-11 DIAGNOSIS — F319 Bipolar disorder, unspecified: Secondary | ICD-10-CM | POA: Diagnosis not present

## 2019-02-11 DIAGNOSIS — J42 Unspecified chronic bronchitis: Secondary | ICD-10-CM

## 2019-02-11 DIAGNOSIS — I359 Nonrheumatic aortic valve disorder, unspecified: Secondary | ICD-10-CM

## 2019-02-11 DIAGNOSIS — I890 Lymphedema, not elsewhere classified: Secondary | ICD-10-CM

## 2019-02-11 DIAGNOSIS — Z791 Long term (current) use of non-steroidal anti-inflammatories (NSAID): Secondary | ICD-10-CM | POA: Insufficient documentation

## 2019-02-11 DIAGNOSIS — F1011 Alcohol abuse, in remission: Secondary | ICD-10-CM | POA: Insufficient documentation

## 2019-02-11 DIAGNOSIS — I110 Hypertensive heart disease with heart failure: Secondary | ICD-10-CM | POA: Diagnosis not present

## 2019-02-11 DIAGNOSIS — J449 Chronic obstructive pulmonary disease, unspecified: Secondary | ICD-10-CM | POA: Diagnosis not present

## 2019-02-11 DIAGNOSIS — Z79899 Other long term (current) drug therapy: Secondary | ICD-10-CM | POA: Insufficient documentation

## 2019-02-11 DIAGNOSIS — R140 Abdominal distension (gaseous): Secondary | ICD-10-CM | POA: Diagnosis not present

## 2019-02-11 DIAGNOSIS — Z952 Presence of prosthetic heart valve: Secondary | ICD-10-CM | POA: Insufficient documentation

## 2019-02-11 DIAGNOSIS — I38 Endocarditis, valve unspecified: Secondary | ICD-10-CM | POA: Insufficient documentation

## 2019-02-11 DIAGNOSIS — I10 Essential (primary) hypertension: Secondary | ICD-10-CM

## 2019-02-11 LAB — BASIC METABOLIC PANEL
Anion gap: 9 (ref 5–15)
BUN: 19 mg/dL (ref 6–20)
CO2: 21 mmol/L — ABNORMAL LOW (ref 22–32)
Calcium: 8.6 mg/dL — ABNORMAL LOW (ref 8.9–10.3)
Chloride: 106 mmol/L (ref 98–111)
Creatinine, Ser: 1.12 mg/dL (ref 0.61–1.24)
GFR calc Af Amer: >60 mL/min (ref >60)
GFR calc non Af Amer: >60 mL/min (ref >60)
Glucose, Bld: 119 mg/dL — ABNORMAL HIGH (ref 70–99)
Potassium: 4.8 mmol/L (ref 3.5–5.1)
Sodium: 136 mmol/L (ref 135–145)

## 2019-02-11 LAB — BRAIN NATRIURETIC PEPTIDE: B Natriuretic Peptide: 1515 pg/mL — ABNORMAL HIGH (ref 0.0–100.0)

## 2019-02-11 MED ORDER — SODIUM CHLORIDE FLUSH 0.9 % IV SOLN
INTRAVENOUS | Status: AC
  Filled 2019-02-11: qty 20

## 2019-02-11 MED ORDER — POTASSIUM CHLORIDE CRYS ER 20 MEQ PO TBCR
EXTENDED_RELEASE_TABLET | ORAL | Status: AC
  Filled 2019-02-11: qty 2

## 2019-02-11 MED ORDER — FUROSEMIDE 10 MG/ML IJ SOLN
80.0000 mg | Freq: Once | INTRAMUSCULAR | Status: AC
  Administered 2019-02-11: 80 mg via INTRAVENOUS

## 2019-02-11 MED ORDER — FUROSEMIDE 10 MG/ML IJ SOLN
INTRAMUSCULAR | Status: AC
  Filled 2019-02-11: qty 8

## 2019-02-11 MED ORDER — POTASSIUM CHLORIDE CRYS ER 20 MEQ PO TBCR
40.0000 meq | EXTENDED_RELEASE_TABLET | Freq: Once | ORAL | Status: AC
  Administered 2019-02-11: 40 meq via ORAL

## 2019-02-11 NOTE — Patient Instructions (Signed)
Continue weighing daily and call for an overnight weight gain of > 2 pounds or a weekly weight gain of >5 pounds. 

## 2019-02-11 NOTE — Progress Notes (Deleted)
Patient ID: [name], male    DOB: July 11, 1960, 58 y.o.   MRN: [id number]  HPI  [name] is a 58 y/o male with a history of HTN, COPD, alcohol abuse, bipolar, pulmonary HTN, valvular heart disease, current tobacco use and chronic heart failure.   Echo report from 02/04/2019 reviewed and showed an EF of 40-45% along with moderate/severe Mike/TR, mild AR/PR, mild mitral stenosis and pulmonary HTN. Echo report from 01/07/2019 reviewed and showed an EF of 50-55% along with moderate Mike, mild AR/PR and severe TR along with severely elevated PA pressure.   Admitted 01/30/2019 due to acute on chronic HF. Cardiology, pulmonology, palliative care, GI and nephrology consults were obtained. Initially given IV lasix and then adjusted due to worsening renal function and then transitioned to oral diuretics. Discharged after 8 days. Was in the ED 01/21/2019 due to acute on chronic HF. Was given IV lasix with diuresis >1L. Admitted 01/06/2019 due to pleural effusion and acute on chronic HF. Cardiology consult obtained. Initially given IV lasix and then transitioned to oral diuretics. Discharged after 2 days. Was in the ED 12/28/2018 due to bronchitis where he was treated and released.   He presents today for a follow-up visit with a chief complaint of   Did received 80mg  IV lasix/ 73meq potassium yesterday.   Past Medical History:  Diagnosis Date  . Alcohol abuse   . Bipolar 1 disorder ([place])   . CHF (congestive heart failure) ([place])   . COPD (chronic obstructive pulmonary disease) ([place])   . Hip pain   . Hypertension   . Pulmonary HTN ([place])   . Valvular heart disease    Past Surgical History:  Procedure Laterality Date  . AORTIC VALVE REPLACEMENT     No family history on file. Social History   Tobacco Use  . Smoking status: Current Every Day Smoker    Packs/day: 1.00    Types: Cigarettes  . Smokeless tobacco: Never Used  Substance Use Topics  . Alcohol use: Yes   No Known Allergies       Review of Systems  Constitutional: Positive for fatigue. Negative for appetite change.  HENT: Negative for congestion, rhinorrhea and sore throat.   Eyes: Negative.   Respiratory: Positive for cough, shortness of breath and wheezing.   Cardiovascular: Positive for leg swelling. Negative for chest pain and palpitations.  Gastrointestinal: Positive for abdominal distention. Negative for abdominal pain.  Endocrine: Negative.   Genitourinary: Positive for scrotal swelling (patient reported).  Musculoskeletal: Positive for arthralgias (left hip). Negative for back pain.  Skin: Negative.   Allergic/Immunologic: Negative.   Neurological: Positive for headaches. Negative for dizziness and light-headedness.  Hematological: Negative for adenopathy. Does not bruise/bleed easily.  Psychiatric/Behavioral: Positive for sleep disturbance (+ orthopnea). Negative for dysphoric mood. The patient is nervous/anxious.       Physical Exam Vitals signs and nursing note reviewed.  Constitutional:      Appearance: He is well-developed.  HENT:     Head: Normocephalic and atraumatic.  Neck:     Musculoskeletal: Normal range of motion and neck supple.     Vascular: No JVD.  Cardiovascular:     Rate and Rhythm: Regular rhythm. Tachycardia present.  Pulmonary:     Effort: Pulmonary effort is normal. No respiratory distress.     Breath sounds: Examination of the right-upper field reveals rales. Examination of the right-lower field reveals rales. Rales present. No wheezing.  Abdominal:     General: There is distension.  Tenderness: There is no abdominal tenderness.  Musculoskeletal:     Right lower leg: He exhibits no tenderness. Edema (3+ pitting to mid thigh) present.     Left lower leg: He exhibits no tenderness. Edema (3+ pitting to mid thigh) present.  Skin:    General: Skin is warm and dry.  Neurological:     General: No focal deficit present.     Mental Status: He is alert and oriented to  person, place, and time.  Psychiatric:        Mood and Affect: Mood is anxious.        Behavior: Behavior normal.    Assessment & Plan:  1: Acute on Chronic heart failure with mildly reduced ejection fraction- - NYHA class III - moderately fluid overload with lower extremity edema, abdominal distention and weight gain - weighing daily; reminded to call for an overnight weight gain of >2 pounds or a weekly weight gain of >5 pounds - weight 173.9 pounds from last visit here yesterday - received 80mg  IV lasix/ PO potassium yesterday - discussed that he may need IV lasix again tomorrow but if symptoms worsen overnight, he is to present to the ER - not adding salt and tries to eat low sodium foods - participating in paramedicine program & have messaged paramedic to update her - BNP 02/11/2019 was 1515.0 - will probably need to change diuretic from furosemide to torsemide in the future  2: COPD/ pulmonary HTN- - pulmonology saw patient during recent admission; "likely group 2 and 3 secondary to cardiac disease and chronic COPD" - has albuterol inhaler for PRN use  3: HTN- - BP  - saw PCP @ [facility] 01/27/2019 & returns 02/25/2019 - BMP from 02/11/2019 reviewed and showed sodium 136, potassium 4.8, creatinine 1.12 and GFR >60  4: Tobacco use- - currently smokes 1 ppd of cigarettes - complete cessation discussed for 3 minutes with him  5: Valvular disease- - had aortic valve replaced 1990 with repeat replacement in 2000 - saw cardiology (Hinderliter) 10/10/17; instructed to make a follow-up appointment  6: Lymphedema- - stage 2 - he says that he's elevating his legs at night but the edema has worsened - encouraged him to elevate his legs during the day when sitting for long periods of time  - does have compression socks that he's been wearing daily with removal at bedtime with minimal improvement in edema - does do some walking but gets short of breath easily - consider lymphapress  compression boots if edema persists  Medication bottles were reviewed.

## 2019-02-11 NOTE — Discharge Summary (Signed)
[place] [facility]   PATIENT NAME: [name]    MR#:  [id number]  DATE OF BIRTH:  10/21/1960  DATE OF ADMISSION:  01/30/2019   ADMITTING PHYSICIAN: Jimmye Norman, NP  DATE OF DISCHARGE: 02/07/2019  1:22 PM  PRIMARY CARE PHYSICIAN: Medicine, [employer]   ADMISSION DIAGNOSIS:  Acute on chronic systolic congestive heart failure (HCC) [I50.23] DISCHARGE DIAGNOSIS:  Active Problems:   CHF (congestive heart failure) (HCC)   Elevated LFTs   Abdominal distention   AKI (acute kidney injury) (HCC)  SECONDARY DIAGNOSIS:   Past Medical History:  Diagnosis Date   Alcohol abuse    Bipolar 1 disorder (HCC)    CHF (congestive heart failure) (HCC)    COPD (chronic obstructive pulmonary disease) (HCC)    Hip pain    Hypertension    Pulmonary HTN (HCC)    Valvular heart disease    HOSPITAL COURSE:   1. Acute diastolic congestive heart failure with moderate mitral regurgitation and severe tricuspid regurgitation - strict dietary and meds compliance recommended. Needs close f/up with CHF clinic and cardio  2. Acute hypoxic respiratory failure. wean off oxygen.  Patient does not wear oxygen at home. 3. Pulmonary hypertension seen on echocardiogram - outpt pulmo f/up  4. abdominal distention from alcoholic liver cirrhosis.  Ultrasound with the cholecystitis with gallbladder wall thickening but no gallstones but patient is reporting abdominal distention .s/p ultrasound-guided paracentesis for symptomatic relief - no SBP. CT abd confirms ascites but no other patho 5. Hyperlipidemia on Lipitor 6. Acute on CKD 2: at baseline now 7.  Acute hepatic failure - thought to be due to cocaine induced ischemia (UDS +) with underlying alcoholic hepatitis & cirrhosis, Ultrasound Doppler did not reveal portal vein thrombosis, LFTs improving  8. Constipation -resolved  DISCHARGE CONDITIONS:  stable CONSULTS OBTAINED:  Treatment Team:  Lamar Blinks, MD DRUG  ALLERGIES:  No Known Allergies DISCHARGE MEDICATIONS:   Allergies as of 02/07/2019   No Known Allergies     Medication List    TAKE these medications   acetaminophen 500 MG tablet Commonly known as: TYLENOL Take 1,000 mg by mouth every 8 (eight) hours.   albuterol 108 (90 Base) MCG/ACT inhaler Commonly known as: VENTOLIN HFA Inhale 2 puffs into the lungs every 4 (four) hours as needed for wheezing or shortness of breath.   atorvastatin 40 MG tablet Commonly known as: LIPITOR Take 40 mg by mouth daily.   azithromycin 250 MG tablet Commonly known as: Zithromax Take as directed on package.   DULoxetine 30 MG capsule Commonly known as: CYMBALTA Take 30 mg by mouth daily.   FLUoxetine 20 MG capsule Commonly known as: PROZAC Take 20 mg by mouth every morning.   furosemide 40 MG tablet Commonly known as: Lasix Take 1 tablet (40 mg total) by mouth daily.   gabapentin 300 MG capsule Commonly known as: NEURONTIN Take 300 mg by mouth 3 (three) times daily.   ipratropium 17 MCG/ACT inhaler Commonly known as: ATROVENT HFA Inhale 2 puffs into the lungs 4 (four) times daily.   metoprolol tartrate 25 MG tablet Commonly known as: LOPRESSOR Take 25 mg by mouth daily.   mirtazapine 15 MG tablet Commonly known as: REMERON Take 15 mg by mouth at bedtime.   naproxen 500 MG tablet Commonly known as: NAPROSYN Take 500 mg by mouth 2 (two) times daily with a meal.   Potassium Chloride ER 20 MEQ Tbcr Take 20 mEq by mouth daily.  DISCHARGE INSTRUCTIONS:   DIET:  Regular diet DISCHARGE CONDITION:  Good ACTIVITY:  Activity as tolerated OXYGEN:  Home Oxygen: No.  Oxygen Delivery: room air DISCHARGE LOCATION:  home   If you experience worsening of your admission symptoms, develop shortness of breath, life threatening emergency, suicidal or homicidal thoughts you must seek medical attention immediately by calling 911 or calling your MD immediately  if symptoms less  severe.  You Must read complete instructions/literature along with all the possible adverse reactions/side effects for all the Medicines you take and that have been prescribed to you. Take any new Medicines after you have completely understood and accpet all the possible adverse reactions/side effects.   Please note  You were cared for by a hospitalist during your hospital stay. If you have any questions about your discharge medications or the care you received while you were in the hospital after you are discharged, you can call the unit and asked to speak with the hospitalist on call if the hospitalist that took care of you is not available. Once you are discharged, your primary care physician will handle any further medical issues. Please note that NO REFILLS for any discharge medications will be authorized once you are discharged, as it is imperative that you return to your primary care physician (or establish a relationship with a primary care physician if you do not have one) for your aftercare needs so that they can reassess your need for medications and monitor your lab values.    On the day of Discharge:  VITAL SIGNS:  Blood pressure 105/70, pulse 82, temperature 98.4 F (36.9 C), temperature source Oral, resp. rate 19, height 5\' 8"  (1.727 m), weight 74.3 kg, SpO2 96 %. PHYSICAL EXAMINATION:  GENERAL:  58 y.o.-year-old patient lying in the bed with no acute distress.  EYES: Pupils equal, round, reactive to light and accommodation. No scleral icterus. Extraocular muscles intact.  HEENT: Head atraumatic, normocephalic. Oropharynx and nasopharynx clear.  NECK:  Supple, no jugular venous distention. No thyroid enlargement, no tenderness.  LUNGS: Normal breath sounds bilaterally, no wheezing, rales,rhonchi or crepitation. No use of accessory muscles of respiration.  CARDIOVASCULAR: S1, S2 normal. No murmurs, rubs, or gallops.  ABDOMEN: Soft, non-tender, non-distended. Bowel sounds present.  No organomegaly or mass.  EXTREMITIES: No pedal edema, cyanosis, or clubbing.  NEUROLOGIC: Cranial nerves II through XII are intact. Muscle strength 5/5 in all extremities. Sensation intact. Gait not checked.  PSYCHIATRIC: The patient is alert and oriented x 3.  SKIN: No obvious rash, lesion, or ulcer.  DATA REVIEW:   CBC Recent Labs  Lab 02/07/19 0523  WBC 6.6  HGB 11.2*  HCT 33.3*  PLT 79*    Chemistries  Recent Labs  Lab 02/07/19 0523 02/11/19 1024  NA 139 136  K 3.5 4.8  CL 103 106  CO2 25 21*  GLUCOSE 97 119*  BUN 28* 19  CREATININE 0.91 1.12  CALCIUM 8.6* 8.6*  AST 449*  --   ALT 495*  --   ALKPHOS 125  --   BILITOT 2.2*  --      Follow-up Information    [place] Follow up on 02/11/2019.   Specialty: Cardiology Why: at 8:30am. Please enter through the [place] entrance Contact information: [address] [place] [phone]       Medicine, [place]. Schedule an appointment as soon as possible for a visit in 1 week(s).  Contact information: [address] [place] [id number] [phone]           Management plans discussed with the patient, family and they are in agreement.  CODE STATUS: Prior   TOTAL TIME TAKING CARE OF THIS PATIENT: 45 minutes.    Delfino LovettVipul Tyliek Timberman M.D on 02/11/2019 at 11:15 AM  Between 7am to 6pm - Pager - [phone]  After 6pm go to [email or web address] - password [email or web address]  [facility]   CC: Primary care physician; [facility] [employer]   Note: This dictation was prepared with Dragon dictation along with smaller phrase technology. Any transcriptional errors that result from this process are unintentional.

## 2019-02-12 ENCOUNTER — Telehealth: Payer: Self-pay | Admitting: Family

## 2019-02-12 ENCOUNTER — Ambulatory Visit: Payer: Medicaid Other | Admitting: Family

## 2019-02-12 NOTE — Telephone Encounter (Signed)
Patient did not show for his Heart Failure Clinic appointment on 02/12/2019. Will attempt to reschedule.

## 2019-02-12 NOTE — Telephone Encounter (Signed)
Patient called saying that the IV lasix that was given to him yesterday worked " a little bit" but he still feels swollen. Explained he missed his appointment earlier today and he says that he was at a community support meeting and was still there so can't make it today. He is unable to say what his weight was today.   Told him that his renal function looked good and he could double his furosemide to 80mg  daily through the weekend and come see me on Monday, February 15, 2019. Should his symptoms worsen, he's to present to the ED. Patient verbalized understanding.

## 2019-02-13 NOTE — Progress Notes (Signed)
Patient ID: [name], male    DOB: 05-Apr-1961, 58 y.o.   MRN: [id number]  HPI  [name] is a 58 y/o male with a history of HTN, COPD, alcohol abuse, bipolar, pulmonary HTN, valvular heart disease, current tobacco use and chronic heart failure.   Echo report from 02/04/2019 reviewed and showed an EF of 40-45% along with moderate/severe Mike/TR, mild AR/PR, mild mitral stenosis and pulmonary HTN. Echo report from 01/07/2019 reviewed and showed an EF of 50-55% along with moderate Mike, mild AR/PR and severe TR along with severely elevated PA pressure.   Admitted 01/30/2019 due to acute on chronic HF. Cardiology, pulmonology, palliative care, GI and nephrology consults were obtained. Initially given IV lasix and then adjusted due to worsening renal function and then transitioned to oral diuretics. Discharged after 8 days. Was in the ED 01/21/2019 due to acute on chronic HF. Was given IV lasix with diuresis >1L. Admitted 01/06/2019 due to pleural effusion and acute on chronic HF. Cardiology consult obtained. Initially given IV lasix and then transitioned to oral diuretics. Discharged after 2 days. Was in the ED 12/28/2018 due to bronchitis where he was treated and released.   He presents today for a follow-up visit with a chief complaint of moderate shortness of breath upon little exertion. He describes this as chronic in nature and says that he feels like it's getting worse. He doubled his furosemide over the weekend and took 80mg  daily but doesn't feel like it's helped at all. He has associated fatigue, cough, wheezing, pedal edema, abdominal distention, reports scrotal swelling, difficulty sleeping and slight weight gain along with this. He denies any palpitations, chest pain or dizziness.   Did receive 80mg  IV lasix/ 34meq potassium a few days ago.   Past Medical History:  Diagnosis Date  . Alcohol abuse   . Bipolar 1 disorder ([place])   . CHF (congestive heart failure) ([place])   . COPD (chronic  obstructive pulmonary disease) ([place])   . Hip pain   . Hypertension   . Pulmonary HTN ([place])   . Valvular heart disease    Past Surgical History:  Procedure Laterality Date  . AORTIC VALVE REPLACEMENT     No family history on file. Social History   Tobacco Use  . Smoking status: Current Every Day Smoker    Packs/day: 1.00    Types: Cigarettes  . Smokeless tobacco: Never Used  Substance Use Topics  . Alcohol use: Yes   No Known Allergies  Prior to Admission medications   Medication Sig Start Date End Date Taking? Authorizing Provider  acetaminophen (TYLENOL) 500 MG tablet Take 1,000 mg by mouth every 8 (eight) hours. 10/30/18 10/30/19 Yes [provider]  aspirin EC 81 MG tablet Take 81 mg by mouth daily.   Yes [provider]  atorvastatin (LIPITOR) 40 MG tablet Take 40 mg by mouth daily. 10/30/18 10/30/19 Yes [provider]  azithromycin (ZITHROMAX) 250 MG tablet Take as directed on package. 01/28/19  Yes , [name] A, FNP  DULoxetine (CYMBALTA) 30 MG capsule Take 30 mg by mouth daily. 09/08/18  Yes [provider]  FLUoxetine (PROZAC) 20 MG capsule Take 20 mg by mouth every morning. 08/26/18  Yes [provider]  furosemide (LASIX) 40 MG tablet Take 1 tablet (40 mg total) by mouth daily. 01/28/19 01/28/20 Yes , [name] A, FNP  mirtazapine (REMERON) 15 MG tablet Take 15 mg by mouth at bedtime. 09/08/18 09/08/19 Yes [provider]  naproxen (NAPROSYN) 500  MG tablet Take 500 mg by mouth 2 (two) times daily with a meal.   Yes [provider]  potassium chloride 20 MEQ TBCR Take 20 mEq by mouth daily. 01/28/19  Yes , Inetta Fermo A, FNP  albuterol (VENTOLIN HFA) 108 (90 Base) MCG/ACT inhaler Inhale 2 puffs into the lungs every 4 (four) hours as needed for wheezing or shortness of breath. 01/21/19   Shaune Pollack, MD  gabapentin (NEURONTIN) 300 MG capsule Take 300 mg by mouth 3 (three) times daily. 04/14/18 04/14/19  [provider]  ipratropium (ATROVENT HFA) 17 MCG/ACT inhaler Inhale 2 puffs into the lungs 4 (four) times daily. 01/27/19 01/27/20  [provider]  metoprolol tartrate (LOPRESSOR) 25 MG tablet Take 25 mg by mouth daily. 08/26/18   [provider]      Review of Systems  Constitutional: Positive for fatigue. Negative for appetite change.  HENT: Negative for congestion, rhinorrhea and sore throat.   Eyes: Negative.   Respiratory: Positive for cough, shortness of breath and wheezing.   Cardiovascular: Positive for leg swelling. Negative for chest pain and palpitations.  Gastrointestinal: Positive for abdominal distention. Negative for abdominal pain.  Endocrine: Negative.   Genitourinary: Positive for scrotal swelling (patient reported).  Musculoskeletal: Positive for arthralgias (left hip). Negative for back pain.  Skin: Negative.   Allergic/Immunologic: Negative.   Neurological: Positive for headaches. Negative for dizziness and light-headedness.  Hematological: Negative for adenopathy. Does not bruise/bleed easily.  Psychiatric/Behavioral: Positive for sleep disturbance (+ orthopnea). Negative for dysphoric mood. The patient is nervous/anxious.    Vitals:   02/15/19 0948  BP: 119/77  Pulse: 92  Resp: 18  SpO2: 100%  Weight: 176 lb 6.4 oz (80 kg)  Height: 5\' 8"  (1.727 m)   Wt Readings from Last 3 Encounters:  02/15/19 176 lb 6.4 oz (80 kg)  02/11/19 173 lb 9.6 oz (78.7 kg)  02/07/19 163 lb 12.8 oz (74.3 kg)   Lab Results  Component Value Date   CREATININE 1.12 02/11/2019   CREATININE 0.91 02/07/2019   CREATININE 1.66 (H) 02/06/2019     Physical Exam Vitals signs and nursing note reviewed.  Constitutional:      Appearance: He is well-developed.  HENT:     Head: Normocephalic and atraumatic.  Neck:     Musculoskeletal: Normal range of motion and neck supple.     Vascular: No JVD.  Cardiovascular:     Rate and Rhythm: Regular rhythm. Tachycardia  present.  Pulmonary:     Effort: Pulmonary effort is normal. No respiratory distress.     Breath sounds: Examination of the right-upper field reveals wheezing. Examination of the left-upper field reveals wheezing. Examination of the right-lower field reveals rales. Examination of the left-lower field reveals wheezing. Wheezing and rales present.  Abdominal:     General: There is distension.     Tenderness: There is no abdominal tenderness.  Musculoskeletal:     Right lower leg: He exhibits no tenderness. Edema (3+ pitting to mid thigh) present.     Left lower leg: He exhibits no tenderness. Edema (3+ pitting to mid thigh) present.  Skin:    General: Skin is warm and dry.  Neurological:     General: No focal deficit present.     Mental Status: He is alert and oriented to person, place, and time.  Psychiatric:        Mood and Affect: Mood is anxious.        Behavior: Behavior normal.  Assessment & Plan:  1: Acute on Chronic heart failure with mildly reduced ejection fraction- - NYHA class III - moderately fluid overload with lower extremity edema, abdominal distention and weight gain - weighing daily; reminded to call for an overnight weight gain of >2 pounds or a weekly weight gain of >5 pounds - weight up 3 pounds from last visit here a few days ago - received 80mg  IV lasix/ 40meq PO potassium a few days ago - missed his f/u appointment last week so he was instructed to double his furosemide over the weekend; he says that he didn't notice any difference when doing that - will send for 80mg  IV lasix/ 40meq PO potassium today - will stop his furosemide (he says that he hasn't taken it today) and will begin torsemide 60mg  daily. Advised him to start the torsemide today as he says that he can get the medication picked up today - not adding salt and tries to eat low sodium foods - participating in paramedicine program  - BNP 02/11/2019 was 1515.0  2: COPD/ pulmonary HTN- - pulmonology  saw patient during recent admission; "likely group 2 and 3 secondary to cardiac disease and chronic COPD" - has albuterol inhaler for PRN use; refilled today  3: HTN- - BP looks good today - [facility] & returns 02/25/2019 - BMP from 02/11/2019 reviewed and showed sodium 136, potassium 4.8, creatinine 1.12 and GFR >60  4: Tobacco use- - currently smokes 3-4 cigarettes daily - endorses alcohol use but says that it's "a little bit" and was unwilling to quantify - complete cessation discussed for 3 minutes with him  5: Valvular disease- - had aortic valve replaced 1990 with repeat replacement in 2000 - saw cardiology ([place]) 10/10/17; instructed to make a follow-up appointment  6: Lymphedema- - stage 2 - he says that he's elevating his legs at night but the edema continues - encouraged him to elevate his legs during the day when sitting for long periods of time  - does have compression socks that he's been wearing daily with removal at bedtime with minimal improvement in edema - does do some walking but gets short of breath easily - consider lymphapress compression boots if edema persists  Medication bottles were reviewed.   Return in 2 days. Should the IV lasix and change from oral furosemide to torsemide not work, he may need ED assessment.

## 2019-02-15 ENCOUNTER — Ambulatory Visit: Payer: Medicaid Other | Admitting: Family

## 2019-02-15 ENCOUNTER — Telehealth (HOSPITAL_COMMUNITY): Payer: Self-pay

## 2019-02-15 ENCOUNTER — Other Ambulatory Visit: Payer: Self-pay

## 2019-02-15 ENCOUNTER — Other Ambulatory Visit: Payer: Self-pay | Admitting: Family

## 2019-02-15 ENCOUNTER — Ambulatory Visit
Admission: RE | Admit: 2019-02-15 | Discharge: 2019-02-15 | Disposition: A | Discharge status: Home / Self Care | Payer: Medicaid Other | Source: Ambulatory Visit | Attending: Family | Admitting: Family

## 2019-02-15 ENCOUNTER — Encounter: Payer: Self-pay | Admitting: Family

## 2019-02-15 VITALS — BP 119/77 | HR 92 | Resp 18 | Ht 68.0 in | Wt 176.4 lb

## 2019-02-15 DIAGNOSIS — Z79899 Other long term (current) drug therapy: Secondary | ICD-10-CM | POA: Insufficient documentation

## 2019-02-15 DIAGNOSIS — J449 Chronic obstructive pulmonary disease, unspecified: Secondary | ICD-10-CM | POA: Insufficient documentation

## 2019-02-15 DIAGNOSIS — R0602 Shortness of breath: Secondary | ICD-10-CM | POA: Insufficient documentation

## 2019-02-15 DIAGNOSIS — I5023 Acute on chronic systolic (congestive) heart failure: Secondary | ICD-10-CM | POA: Insufficient documentation

## 2019-02-15 DIAGNOSIS — I509 Heart failure, unspecified: Secondary | ICD-10-CM | POA: Insufficient documentation

## 2019-02-15 DIAGNOSIS — F17210 Nicotine dependence, cigarettes, uncomplicated: Secondary | ICD-10-CM | POA: Insufficient documentation

## 2019-02-15 DIAGNOSIS — F319 Bipolar disorder, unspecified: Secondary | ICD-10-CM | POA: Insufficient documentation

## 2019-02-15 DIAGNOSIS — I38 Endocarditis, valve unspecified: Secondary | ICD-10-CM | POA: Insufficient documentation

## 2019-02-15 DIAGNOSIS — J42 Unspecified chronic bronchitis: Secondary | ICD-10-CM

## 2019-02-15 DIAGNOSIS — R140 Abdominal distension (gaseous): Secondary | ICD-10-CM | POA: Insufficient documentation

## 2019-02-15 DIAGNOSIS — I10 Essential (primary) hypertension: Secondary | ICD-10-CM

## 2019-02-15 DIAGNOSIS — I110 Hypertensive heart disease with heart failure: Secondary | ICD-10-CM | POA: Insufficient documentation

## 2019-02-15 DIAGNOSIS — I5043 Acute on chronic combined systolic (congestive) and diastolic (congestive) heart failure: Secondary | ICD-10-CM

## 2019-02-15 DIAGNOSIS — I890 Lymphedema, not elsewhere classified: Secondary | ICD-10-CM | POA: Insufficient documentation

## 2019-02-15 DIAGNOSIS — I272 Pulmonary hypertension, unspecified: Secondary | ICD-10-CM | POA: Insufficient documentation

## 2019-02-15 DIAGNOSIS — Z720 Tobacco use: Secondary | ICD-10-CM

## 2019-02-15 DIAGNOSIS — R5383 Other fatigue: Secondary | ICD-10-CM | POA: Insufficient documentation

## 2019-02-15 DIAGNOSIS — Z7982 Long term (current) use of aspirin: Secondary | ICD-10-CM | POA: Insufficient documentation

## 2019-02-15 DIAGNOSIS — G479 Sleep disorder, unspecified: Secondary | ICD-10-CM | POA: Insufficient documentation

## 2019-02-15 DIAGNOSIS — I359 Nonrheumatic aortic valve disorder, unspecified: Secondary | ICD-10-CM

## 2019-02-15 DIAGNOSIS — Z952 Presence of prosthetic heart valve: Secondary | ICD-10-CM | POA: Insufficient documentation

## 2019-02-15 DIAGNOSIS — R05 Cough: Secondary | ICD-10-CM | POA: Insufficient documentation

## 2019-02-15 LAB — BASIC METABOLIC PANEL
Anion gap: 9 (ref 5–15)
BUN: 28 mg/dL — ABNORMAL HIGH (ref 6–20)
CO2: 23 mmol/L (ref 22–32)
Calcium: 8.5 mg/dL — ABNORMAL LOW (ref 8.9–10.3)
Chloride: 102 mmol/L (ref 98–111)
Creatinine, Ser: 1.17 mg/dL (ref 0.61–1.24)
GFR calc Af Amer: >60 mL/min (ref >60)
GFR calc non Af Amer: >60 mL/min (ref >60)
Glucose, Bld: 136 mg/dL — ABNORMAL HIGH (ref 70–99)
Potassium: 3.8 mmol/L (ref 3.5–5.1)
Sodium: 134 mmol/L — ABNORMAL LOW (ref 135–145)

## 2019-02-15 LAB — BRAIN NATRIURETIC PEPTIDE: B Natriuretic Peptide: 1665 pg/mL — ABNORMAL HIGH (ref 0.0–100.0)

## 2019-02-15 MED ORDER — FUROSEMIDE 10 MG/ML IJ SOLN
INTRAMUSCULAR | Status: AC
  Filled 2019-02-15: qty 8

## 2019-02-15 MED ORDER — POTASSIUM CHLORIDE CRYS ER 20 MEQ PO TBCR
EXTENDED_RELEASE_TABLET | ORAL | Status: AC
  Filled 2019-02-15: qty 2

## 2019-02-15 MED ORDER — TORSEMIDE 20 MG PO TABS: 60.0000 mg | ORAL_TABLET | Freq: Every day | ORAL | 3 refills | Status: AC

## 2019-02-15 MED ORDER — GABAPENTIN 300 MG PO CAPS: 300.0000 mg | ORAL_CAPSULE | Freq: Three times a day (TID) | ORAL | 3 refills | Status: AC

## 2019-02-15 MED ORDER — IPRATROPIUM BROMIDE HFA 17 MCG/ACT IN AERS: 2.0000 | INHALATION_SPRAY | Freq: Four times a day (QID) | RESPIRATORY_TRACT | 5 refills | Status: AC

## 2019-02-15 MED ORDER — POTASSIUM CHLORIDE CRYS ER 20 MEQ PO TBCR
40.0000 meq | EXTENDED_RELEASE_TABLET | Freq: Once | ORAL | Status: AC
  Administered 2019-02-15: 40 meq via ORAL

## 2019-02-15 MED ORDER — FUROSEMIDE 10 MG/ML IJ SOLN
80.0000 mg | Freq: Once | INTRAMUSCULAR | Status: AC
  Administered 2019-02-15: 80 mg via INTRAVENOUS

## 2019-02-15 NOTE — Patient Instructions (Addendum)
Continue weighing daily and call for an overnight weight gain of > 2 pounds or a weekly weight gain of >5 pounds.  Do not take anymore furosemide.  Begin torsemide and take 3 tablets every day.

## 2019-02-15 NOTE — Telephone Encounter (Signed)
Contacted [name] today to make sure he had picked up new prescription for torsemide.  He states has not yet, will pick up this evening or in the morning.  Expressed my concern with him that [name] with HF clinic wanted him to start taking it immediately to see if would help with his fluid.  He is aware that she wants to see him back in the clinic on Wednesday, advised him any problems picking it up let me know.  He states he is fine and will let me know if he has any problems.  He also advised he has appt set up with pulmonology in [facility]  Will advise [name] and continue to visit for Heart failure and medication compliance.   [place] [phone]

## 2019-02-16 ENCOUNTER — Telehealth (HOSPITAL_COMMUNITY): Payer: Self-pay

## 2019-02-16 NOTE — Telephone Encounter (Signed)
Spoke with Legrand Como today and he did states he picked his torsemide up and has taken it.  He states been urinating a lot.  He states feels fluid in his lungs still and unable to cough it up.  He is aware of HF Clinic appt tomorrow and importance of it.    [place] [phone]

## 2019-02-16 NOTE — Progress Notes (Deleted)
Patient ID: ARCENIO MULLALY, male    DOB: October 11, 1960, 58 y.o.   MRN: [id number]  HPI  Mr Pavlik is a 58 y/o male with a history of HTN, COPD, alcohol abuse, bipolar, pulmonary HTN, valvular heart disease, current tobacco use and chronic heart failure.   Echo report from 02/04/2019 reviewed and showed an EF of 40-45% along with moderate/severe MR/TR, mild AR/PR, mild mitral stenosis and pulmonary HTN. Echo report from 01/07/2019 reviewed and showed an EF of 50-55% along with moderate MR, mild AR/PR and severe TR along with severely elevated PA pressure.   Admitted 01/30/2019 due to acute on chronic HF. Cardiology, pulmonology, palliative care, GI and nephrology consults were obtained. Initially given IV lasix and then adjusted due to worsening renal function and then transitioned to oral diuretics. Discharged after 8 days. Was in the ED 01/21/2019 due to acute on chronic HF. Was given IV lasix with diuresis >1L. Admitted 01/06/2019 due to pleural effusion and acute on chronic HF. Cardiology consult obtained. Initially given IV lasix and then transitioned to oral diuretics. Discharged after 2 days. Was in the ED 12/28/2018 due to bronchitis where he was treated and released.   He presents today for a follow-up visit with a chief complaint of   Did receive additional 80mg  IV lasix/ 57meq potassium 2 days ago.    Past Medical History:  Diagnosis Date  . Alcohol abuse   . Bipolar 1 disorder ([place])   . CHF (congestive heart failure) ([place])   . COPD (chronic obstructive pulmonary disease) ([place])   . Hip pain   . Hypertension   . Pulmonary HTN ([place])   . Valvular heart disease    Past Surgical History:  Procedure Laterality Date  . AORTIC VALVE REPLACEMENT     No family history on file. Social History   Tobacco Use  . Smoking status: Current Every Day Smoker    Packs/day: 1.00    Types: Cigarettes  . Smokeless tobacco: Never Used  Substance Use Topics  . Alcohol use: Yes   No Known  Allergies      Review of Systems  Constitutional: Positive for fatigue. Negative for appetite change.  HENT: Negative for congestion, rhinorrhea and sore throat.   Eyes: Negative.   Respiratory: Positive for cough, shortness of breath and wheezing.   Cardiovascular: Positive for leg swelling. Negative for chest pain and palpitations.  Gastrointestinal: Positive for abdominal distention. Negative for abdominal pain.  Endocrine: Negative.   Genitourinary: Positive for scrotal swelling (patient reported).  Musculoskeletal: Positive for arthralgias (left hip). Negative for back pain.  Skin: Negative.   Allergic/Immunologic: Negative.   Neurological: Positive for headaches. Negative for dizziness and light-headedness.  Hematological: Negative for adenopathy. Does not bruise/bleed easily.  Psychiatric/Behavioral: Positive for sleep disturbance (+ orthopnea). Negative for dysphoric mood. The patient is nervous/anxious.       Physical Exam Vitals signs and nursing note reviewed.  Constitutional:      Appearance: He is well-developed.  HENT:     Head: Normocephalic and atraumatic.  Neck:     Musculoskeletal: Normal range of motion and neck supple.     Vascular: No JVD.  Cardiovascular:     Rate and Rhythm: Regular rhythm. Tachycardia present.  Pulmonary:     Effort: Pulmonary effort is normal. No respiratory distress.     Breath sounds: Examination of the right-upper field reveals wheezing. Examination of the left-upper field reveals wheezing. Examination of the right-lower field reveals rales. Examination of the left-lower  field reveals wheezing. Wheezing and rales present.  Abdominal:     General: There is distension.     Tenderness: There is no abdominal tenderness.  Musculoskeletal:     Right lower leg: He exhibits no tenderness. Edema (3+ pitting to mid thigh) present.     Left lower leg: He exhibits no tenderness. Edema (3+ pitting to mid thigh) present.  Skin:    General:  Skin is warm and dry.  Neurological:     General: No focal deficit present.     Mental Status: He is alert and oriented to person, place, and time.  Psychiatric:        Mood and Affect: Mood is anxious.        Behavior: Behavior normal.    Assessment & Plan:  1: Acute on Chronic heart failure with mildly reduced ejection fraction- - NYHA class III - moderately fluid overload with lower extremity edema, abdominal distention and weight gain - weighing daily; reminded to call for an overnight weight gain of >2 pounds or a weekly weight gain of >5 pounds - weight 176.6 pounds from last visit here two days ago - received 80mg  IV lasix/ PO potassium two days ago - diuretic changed to torsemide 60mg  daily at last visit - not adding salt and tries to eat low sodium foods - participating in paramedicine program  - BNP 02/15/2019 was 1665.0  2: COPD/ pulmonary HTN- - pulmonology saw patient during recent admission; "likely group 2 and 3 secondary to cardiac disease and chronic COPD" - has albuterol inhaler for PRN use; refilled today  3: HTN- - BP looks good today - saw PCP @ [facility] 01/27/2019 & returns 02/25/2019 - BMP from 02/15/2019 reviewed and showed sodium 134, potassium 3.8, creatinine 1.17 and GFR >60  4: Tobacco use- - currently smokes 3-4 cigarettes daily - endorses alcohol use but says that it's "a little bit" and was unwilling to quantify - complete cessation discussed for 3 minutes with him  5: Valvular disease- - had aortic valve replaced 1990 with repeat replacement in 2000 - saw cardiology (Hinderliter) 10/10/17; instructed to make a follow-up appointment  6: Lymphedema- - stage 2 - he says that he's elevating his legs at night but the edema continues - encouraged him to elevate his legs during the day when sitting for long periods of time  - does have compression socks that he's been wearing daily with removal at bedtime with minimal improvement in edema - does do  some walking but gets short of breath easily - consider lymphapress compression boots if edema persists  Medication bottles were reviewed.

## 2019-02-17 ENCOUNTER — Telehealth: Payer: Self-pay | Admitting: Family

## 2019-02-17 ENCOUNTER — Ambulatory Visit: Payer: Medicaid Other | Admitting: Family

## 2019-02-17 NOTE — Telephone Encounter (Signed)
Patient did not show for his [facility] appointment on 02/17/2019. Will attempt to reschedule.

## 2019-02-18 ENCOUNTER — Other Ambulatory Visit: Payer: Self-pay

## 2019-02-18 ENCOUNTER — Ambulatory Visit: Payer: Medicaid Other | Attending: Family | Admitting: Family

## 2019-02-18 ENCOUNTER — Encounter: Payer: Self-pay | Admitting: Family

## 2019-02-18 VITALS — BP 124/94 | HR 88 | Resp 16 | Ht 68.0 in | Wt 169.2 lb

## 2019-02-18 DIAGNOSIS — I110 Hypertensive heart disease with heart failure: Secondary | ICD-10-CM | POA: Diagnosis not present

## 2019-02-18 DIAGNOSIS — J42 Unspecified chronic bronchitis: Secondary | ICD-10-CM

## 2019-02-18 DIAGNOSIS — J449 Chronic obstructive pulmonary disease, unspecified: Secondary | ICD-10-CM | POA: Diagnosis not present

## 2019-02-18 DIAGNOSIS — F17210 Nicotine dependence, cigarettes, uncomplicated: Secondary | ICD-10-CM | POA: Diagnosis not present

## 2019-02-18 DIAGNOSIS — F319 Bipolar disorder, unspecified: Secondary | ICD-10-CM | POA: Insufficient documentation

## 2019-02-18 DIAGNOSIS — I10 Essential (primary) hypertension: Secondary | ICD-10-CM

## 2019-02-18 DIAGNOSIS — I890 Lymphedema, not elsewhere classified: Secondary | ICD-10-CM | POA: Diagnosis not present

## 2019-02-18 DIAGNOSIS — Z791 Long term (current) use of non-steroidal anti-inflammatories (NSAID): Secondary | ICD-10-CM | POA: Insufficient documentation

## 2019-02-18 DIAGNOSIS — Z79899 Other long term (current) drug therapy: Secondary | ICD-10-CM | POA: Insufficient documentation

## 2019-02-18 DIAGNOSIS — I272 Pulmonary hypertension, unspecified: Secondary | ICD-10-CM | POA: Diagnosis not present

## 2019-02-18 DIAGNOSIS — Z720 Tobacco use: Secondary | ICD-10-CM

## 2019-02-18 DIAGNOSIS — Z952 Presence of prosthetic heart valve: Secondary | ICD-10-CM | POA: Diagnosis not present

## 2019-02-18 DIAGNOSIS — I5023 Acute on chronic systolic (congestive) heart failure: Secondary | ICD-10-CM | POA: Insufficient documentation

## 2019-02-18 DIAGNOSIS — I5043 Acute on chronic combined systolic (congestive) and diastolic (congestive) heart failure: Secondary | ICD-10-CM

## 2019-02-18 DIAGNOSIS — Z7982 Long term (current) use of aspirin: Secondary | ICD-10-CM | POA: Diagnosis not present

## 2019-02-18 DIAGNOSIS — I359 Nonrheumatic aortic valve disorder, unspecified: Secondary | ICD-10-CM

## 2019-02-18 NOTE — Progress Notes (Signed)
Patient ID: [name], male    DOB: Nov 30, 1960, 58 y.o.   MRN: [id number]  HPI  Mr Goodin is a 58 y/o male with a history of HTN, COPD, alcohol abuse, bipolar, pulmonary HTN, valvular heart disease, current tobacco use and chronic heart failure.   Echo report from 02/04/2019 reviewed and showed an EF of 40-45% along with moderate/severe MR/TR, mild AR/PR, mild mitral stenosis and pulmonary HTN. Echo report from 01/07/2019 reviewed and showed an EF of 50-55% along with moderate MR, mild AR/PR and severe TR along with severely elevated PA pressure.   Admitted 01/30/2019 due to acute on chronic HF. Cardiology, pulmonology, palliative care, GI and nephrology consults were obtained. Initially given IV lasix and then adjusted due to worsening renal function and then transitioned to oral diuretics. Discharged after 8 days. Was in the ED 01/21/2019 due to acute on chronic HF. Was given IV lasix with diuresis >1L. Admitted 01/06/2019 due to pleural effusion and acute on chronic HF. Cardiology consult obtained. Initially given IV lasix and then transitioned to oral diuretics. Discharged after 2 days. Was in the ED 12/28/2018 due to bronchitis where he was treated and released.   He presents today for a follow-up visit with a chief complaint of moderate shortness of breath upon minimal exertion. He has associated fatigue, cough, wheezing, pedal edema, abdominal distention, anxiety and difficulty sleeping along with this. He denies any palpitations, chest pain, dizziness or weight gain.   Did receive additional 80mg  IV lasix/ potassium 3 days ago and had diuretic changed to torsemide. He does feel like his swelling has decreased some since diuretic was changed.   Past Medical History:  Diagnosis Date  . Alcohol abuse   . Bipolar 1 disorder ([facility])   . CHF (congestive heart failure) ([facility])   . COPD (chronic obstructive pulmonary disease) ([facility])   . Hip pain   . Hypertension   . Pulmonary HTN ([facility])   .  Valvular heart disease    Past Surgical History:  Procedure Laterality Date  . AORTIC VALVE REPLACEMENT     No family history on file. Social History   Tobacco Use  . Smoking status: Current Every Day Smoker    Packs/day: 1.00    Types: Cigarettes  . Smokeless tobacco: Never Used  Substance Use Topics  . Alcohol use: Yes   No Known Allergies  Prior to Admission medications   Medication Sig Start Date End Date Taking? Authorizing Provider  acetaminophen (TYLENOL) 500 MG tablet Take 1,000 mg by mouth every 8 (eight) hours. 10/30/18 10/30/19 Yes [provider]  albuterol (VENTOLIN HFA) 108 (90 Base) MCG/ACT inhaler Inhale 2 puffs into the lungs every 4 (four) hours as needed for wheezing or shortness of breath. 01/21/19  Yes 01/23/19, MD  aspirin EC 81 MG tablet Take 81 mg by mouth daily.   Yes [provider]  atorvastatin (LIPITOR) 40 MG tablet Take 40 mg by mouth daily. 10/30/18 10/30/19 Yes [provider]  DULoxetine (CYMBALTA) 30 MG capsule Take 30 mg by mouth daily. 09/08/18  Yes [provider]  FLUoxetine (PROZAC) 20 MG capsule Take 20 mg by mouth every morning. 08/26/18  Yes [provider]  gabapentin (NEURONTIN) 300 MG capsule Take 1 capsule (300 mg total) by mouth 3 (three) times daily. 02/15/19 02/15/20 Yes Hackney, 13/9/21 A, FNP  ipratropium (ATROVENT HFA) 17 MCG/ACT inhaler Inhale 2 puffs into the lungs 4 (four) times daily. 02/15/19 02/15/20 Yes 13/9/21, FNP  metoprolol tartrate (LOPRESSOR) 25 MG tablet Take 25 mg by mouth daily. 08/26/18  Yes [provider]  mirtazapine (REMERON) 15 MG tablet Take 15 mg by mouth at bedtime. 09/08/18 09/08/19 Yes [provider]  naproxen (NAPROSYN) 500 MG tablet Take 500 mg by mouth 2 (two) times daily with a meal.   Yes [provider]  potassium chloride 20 MEQ TBCR Take 20 mEq by mouth daily. 01/28/19  Yes Hackney, Inetta Fermoina A, FNP  torsemide (DEMADEX) 20 MG tablet  Take 3 tablets (60 mg total) by mouth daily. 02/15/19 05/16/19 Yes Delma FreezeHackney, Tina A, FNP    Review of Systems  Constitutional: Positive for fatigue. Negative for appetite change.  HENT: Negative for congestion, rhinorrhea and sore throat.   Eyes: Negative.   Respiratory: Positive for cough, shortness of breath and wheezing.   Cardiovascular: Positive for leg swelling (improving). Negative for chest pain and palpitations.  Gastrointestinal: Positive for abdominal distention. Negative for abdominal pain.  Endocrine: Negative.   Genitourinary: Positive for scrotal swelling (patient reported).  Musculoskeletal: Positive for arthralgias (left hip). Negative for back pain.  Skin: Negative.   Allergic/Immunologic: Negative.   Neurological: Positive for headaches. Negative for dizziness and light-headedness.  Hematological: Negative for adenopathy. Does not bruise/bleed easily.  Psychiatric/Behavioral: Positive for sleep disturbance (+ orthopnea). Negative for dysphoric mood. The patient is nervous/anxious (worsening stress).    Vitals:   02/18/19 1151 02/18/19 1152  BP: (!) 124/94   Pulse: (!) 46 88  Resp: 16   SpO2: 100%   Weight: 169 lb 3.2 oz (76.7 kg)   Height: 5\' 8"  (1.727 m)    Wt Readings from Last 3 Encounters:  02/18/19 169 lb 3.2 oz (76.7 kg)  02/15/19 176 lb 6.4 oz (80 kg)  02/11/19 173 lb 9.6 oz (78.7 kg)   Lab Results  Component Value Date   CREATININE 1.17 02/15/2019   CREATININE 1.12 02/11/2019   CREATININE 0.91 02/07/2019    Physical Exam Vitals signs and nursing note reviewed.  Constitutional:      Appearance: He is well-developed.  HENT:     Head: Normocephalic and atraumatic.  Neck:     Musculoskeletal: Normal range of motion and neck supple.     Vascular: No JVD.  Cardiovascular:     Rate and Rhythm: Normal rate. Rhythm irregular.  Pulmonary:     Effort: Pulmonary effort is normal. No respiratory distress.     Breath sounds: Examination of the right-upper  field reveals wheezing. Examination of the left-upper field reveals wheezing. Examination of the left-lower field reveals wheezing. Wheezing present. No rales.  Abdominal:     General: There is distension.     Tenderness: There is no abdominal tenderness.  Musculoskeletal:     Right lower leg: He exhibits no tenderness. Edema (2+ pitting to knee) present.     Left lower leg: He exhibits no tenderness. Edema (3+ pitting to knee) present.  Skin:    General: Skin is warm and dry.  Neurological:     General: No focal deficit present.     Mental Status: He is alert and oriented to person, place, and time.  Psychiatric:        Mood and Affect: Mood is anxious.        Behavior: Behavior normal.    Assessment & Plan:  1: Acute on Chronic heart failure with mildly reduced ejection fraction- - NYHA class III - moderately fluid overload although better from last visit here three days ago -  weighing daily; reminded to call for an overnight weight gain of >2 pounds or a weekly weight gain of >5 pounds - weight down 7 pounds from last visit here three days ago - received 80mg  IV lasix/ 39meq PO potassium three days ago - diuretic changed to torsemide 60mg  daily at last visit; will increase torsemide to 40mg  BID - not adding salt and tries to eat low sodium foods - participating in paramedicine program  - BNP 02/15/2019 was 1665.0  2: COPD/ pulmonary HTN- - pulmonology saw patient during recent admission; "likely group 2 and 3 secondary to cardiac disease and chronic COPD" - has albuterol inhaler for PRN use; will need to see about pulmonology referral  3: HTN- - BP looks good today - saw PCP @ [facility] 01/27/2019 & returns 02/25/2019 - BMP from 02/15/2019 reviewed and showed sodium 134, potassium 3.8, creatinine 1.17 and GFR >60  4: Tobacco use- - currently smokes 3-4 cigarettes daily - endorses alcohol use but says that it's "a little bit" and was unwilling to quantify - complete cessation  discussed for 3 minutes with him  5: Valvular disease- - had aortic valve replaced 1990 with repeat replacement in 2000 - saw cardiology (Hinderliter) 10/10/17; instructed to make a follow-up appointment  6: Lymphedema- - stage 2 - he says that he's elevating his legs at night but the edema continues - encouraged him to elevate his legs during the day when sitting for long periods of time  - does have compression socks but says that he hasn't been wearing them daily; encouraged to wear them every day with removal at bedtime - does do some walking but gets short of breath easily - consider lymphapress compression boots if edema persists  Medication bottles were reviewed.   Since he has appointments over the next 2 weeks will have him return in 3 weeks or sooner for any questions/problems before then.

## 2019-02-18 NOTE — Patient Instructions (Addendum)
Continue weighing daily and call for an overnight weight gain of > 2 pounds or a weekly weight gain of >5 pounds.  Change torsemide to 2 tablets twice daily.

## 2019-02-22 ENCOUNTER — Telehealth: Payer: Self-pay | Admitting: Family

## 2019-02-22 NOTE — Telephone Encounter (Signed)
Patient called to say that he's been having some light-headedness and pressure in his head for the last few days. He says that the swelling in his legs and scrotum is coming down and looks better. He weighed while he was on the phone with me and said that he weighed 156.5 pounds today and thought that last week he was 167.   When he was last seen in the office on 02/18/2019, his torsemide was increased to 40mg  BID due to continued edema.   Called [place] (paramedic) and asked if they could go out tomorrow and assess the patient. Question whether he's getting hypotensive with the increase in torsemide. She says that they will contact the patient and go out tomorrow morning. If his BP is low, may need to decrease his torsemide.  Patient says that he sees his PCP on 02/25/2019

## 2019-02-23 ENCOUNTER — Encounter (HOSPITAL_COMMUNITY): Payer: Self-pay

## 2019-02-23 ENCOUNTER — Other Ambulatory Visit (HOSPITAL_COMMUNITY): Payer: Self-pay

## 2019-02-23 NOTE — Progress Notes (Signed)
Today had a home visit with [name] per [facility] request to check him and do orthostatics.  Lungs are clear with a slight wheeze in bottom right.  He has a junky cough with white sputum he says.  He has edema in left foot through ankle into his calf area, plus 3 edema.  He states has swelling in scrotum.  He states swelling is better today, a lot worst past few days, he states gets worse throughout the day when up walking.  He did not take his second dose of torsemide yesterday because he was a little dizzy when stood up.  He has JVD.  He is urinating a lot. He denies chest pain, headaches or shortness of breath at rest.  Aldine Contes and advised of findings and she advised he has had IV lasix a few times and change in torsemide that he needs to go to ED.  Wyona Almas and he states he has appt with PCP in [facility] in 2 days and he wants to wait til then, he does not want to go to ED.  Advised [name] and advised Arjay he needs to take both dosages of his torsemide and if he gets worst to go to ED.  Advised him he can call me if he needs to.  He appears to understand to take his torsemide as prescribed and if worsens to go to ED.  Will continue to visit for heart failure.   Pinson Tekoa Hamor/[employer] [phone]

## 2019-03-02 ENCOUNTER — Telehealth (HOSPITAL_COMMUNITY): Payer: Self-pay

## 2019-03-02 NOTE — Telephone Encounter (Signed)
Contacted [place] today to sheck to see how he was and to see how his cardiology appt went at [facility].  He states they did not change any of his medication, they drew blood work.  He committed that he is urinating a lot and he thought about stopping 2 of torsemide a day, advised him to stay on what Darylene Price put him on, he states still has some fluid in ankles.  Advised him not to stop any medications without approval from doctor first.  He did states he is feeling better and breathing is getting better.  He states has all his medications and verified them.  We discussed diet and fluid.  He states he drinks sodas and we talked about how he can come off them.  Discussed the holiday and over eating.  Will continue to visit for heart Failure.   [place] [phone]

## 2019-03-10 ENCOUNTER — Encounter: Payer: Self-pay | Admitting: Family

## 2019-03-10 ENCOUNTER — Ambulatory Visit: Payer: Medicaid Other | Attending: Family | Admitting: Family

## 2019-03-10 ENCOUNTER — Other Ambulatory Visit: Payer: Self-pay

## 2019-03-10 VITALS — BP 123/80 | HR 76 | Resp 15 | Ht 68.0 in | Wt 153.0 lb

## 2019-03-10 DIAGNOSIS — I5022 Chronic systolic (congestive) heart failure: Secondary | ICD-10-CM

## 2019-03-10 DIAGNOSIS — F1021 Alcohol dependence, in remission: Secondary | ICD-10-CM | POA: Diagnosis not present

## 2019-03-10 DIAGNOSIS — F17210 Nicotine dependence, cigarettes, uncomplicated: Secondary | ICD-10-CM | POA: Diagnosis not present

## 2019-03-10 DIAGNOSIS — Z79899 Other long term (current) drug therapy: Secondary | ICD-10-CM | POA: Insufficient documentation

## 2019-03-10 DIAGNOSIS — I509 Heart failure, unspecified: Secondary | ICD-10-CM | POA: Insufficient documentation

## 2019-03-10 DIAGNOSIS — Z7982 Long term (current) use of aspirin: Secondary | ICD-10-CM | POA: Diagnosis not present

## 2019-03-10 DIAGNOSIS — I359 Nonrheumatic aortic valve disorder, unspecified: Secondary | ICD-10-CM

## 2019-03-10 DIAGNOSIS — F419 Anxiety disorder, unspecified: Secondary | ICD-10-CM | POA: Diagnosis not present

## 2019-03-10 DIAGNOSIS — F319 Bipolar disorder, unspecified: Secondary | ICD-10-CM | POA: Insufficient documentation

## 2019-03-10 DIAGNOSIS — I110 Hypertensive heart disease with heart failure: Secondary | ICD-10-CM | POA: Diagnosis present

## 2019-03-10 DIAGNOSIS — I272 Pulmonary hypertension, unspecified: Secondary | ICD-10-CM | POA: Diagnosis not present

## 2019-03-10 DIAGNOSIS — I38 Endocarditis, valve unspecified: Secondary | ICD-10-CM | POA: Insufficient documentation

## 2019-03-10 DIAGNOSIS — Z791 Long term (current) use of non-steroidal anti-inflammatories (NSAID): Secondary | ICD-10-CM | POA: Insufficient documentation

## 2019-03-10 DIAGNOSIS — I890 Lymphedema, not elsewhere classified: Secondary | ICD-10-CM | POA: Diagnosis not present

## 2019-03-10 DIAGNOSIS — Z952 Presence of prosthetic heart valve: Secondary | ICD-10-CM | POA: Insufficient documentation

## 2019-03-10 DIAGNOSIS — Z720 Tobacco use: Secondary | ICD-10-CM

## 2019-03-10 DIAGNOSIS — J42 Unspecified chronic bronchitis: Secondary | ICD-10-CM

## 2019-03-10 DIAGNOSIS — J449 Chronic obstructive pulmonary disease, unspecified: Secondary | ICD-10-CM | POA: Insufficient documentation

## 2019-03-10 DIAGNOSIS — I10 Essential (primary) hypertension: Secondary | ICD-10-CM

## 2019-03-10 NOTE — Progress Notes (Signed)
Patient ID: [name], male    DOB: [birth year], 59 y.o.   MRN: [id number]  HPI  [name] is a 58 y/o male with a history of HTN, COPD, alcohol abuse, bipolar, pulmonary HTN, valvular heart disease, current tobacco use and chronic heart failure.   Echo report from 02/04/2019 reviewed and showed an EF of 40-45% along with moderate/severe Mike/TR, mild AR/PR, mild mitral stenosis and pulmonary HTN. Echo report from 01/07/2019 reviewed and showed an EF of 50-55% along with moderate Mike, mild AR/PR and severe TR along with severely elevated PA pressure.   Admitted 01/30/2019 due to acute on chronic HF. Cardiology, pulmonology, palliative care, GI and nephrology consults were obtained. Initially given IV lasix and then adjusted due to worsening renal function and then transitioned to oral diuretics. Discharged after 8 days. Was in the ED 01/21/2019 due to acute on chronic HF. Was given IV lasix with diuresis >1L. Admitted 01/06/2019 due to pleural effusion and acute on chronic HF. Cardiology consult obtained. Initially given IV lasix and then transitioned to oral diuretics. Discharged after 2 days. Was in the ED 12/28/2018 due to bronchitis where he was treated and released.   He presents today for a follow-up visit with a chief complaint of moderate fatigue upon minimal exertion. He describes this as chronic in nature having been present for several years. He has associated fatigue, cough, wheezing, light-headedness, pedal edema (improving), anxiety and chronic difficulty sleeping along with this. He denies any abdominal distention, palpitations, chest pain or weight gain.   Past Medical History:  Diagnosis Date  . Alcohol abuse   . Bipolar 1 disorder ([place])   . CHF (congestive heart failure) ([place])   . COPD (chronic obstructive pulmonary disease) ([place])   . Hip pain   . Hypertension   . Pulmonary HTN ([place])   . Valvular heart disease    Past Surgical History:  Procedure Laterality Date  . AORTIC  VALVE REPLACEMENT     No family history on file. Social History   Tobacco Use  . Smoking status: Current Every Day Smoker    Packs/day: 1.00    Types: Cigarettes  . Smokeless tobacco: Never Used  Substance Use Topics  . Alcohol use: Yes   No Known Allergies  Prior to Admission medications   Medication Sig Start Date End Date Taking? Authorizing Provider  acetaminophen (TYLENOL) 500 MG tablet Take 1,000 mg by mouth every 8 (eight) hours. 10/30/18 10/30/19 Yes [provider]  albuterol (VENTOLIN HFA) 108 (90 Base) MCG/ACT inhaler Inhale 2 puffs into the lungs every 4 (four) hours as needed for wheezing or shortness of breath. 01/21/19  Yes Duffy Bruce, MD  aspirin EC 81 MG tablet Take 81 mg by mouth daily.   Yes [provider]  atorvastatin (LIPITOR) 40 MG tablet Take 40 mg by mouth daily. 10/30/18 10/30/19 Yes [provider]  DULoxetine (CYMBALTA) 30 MG capsule Take 30 mg by mouth daily. 09/08/18  Yes [provider]  FLUoxetine (PROZAC) 20 MG capsule Take 20 mg by mouth every morning. 08/26/18  Yes [provider]  gabapentin (NEURONTIN) 300 MG capsule Take 1 capsule (300 mg total) by mouth 3 (three) times daily. 02/15/19 02/15/20 Yes Chennel Olivos, [name] A, FNP  ipratropium (ATROVENT HFA) 17 MCG/ACT inhaler Inhale 2 puffs into the lungs 4 (four) times daily. 02/15/19 02/15/20 Yes Zhyon Antenucci A, FNP  meloxicam (MOBIC) 15 MG tablet Take 15 mg by mouth daily.   Yes [provider]  mirtazapine (REMERON) 15 MG tablet Take 15 mg by mouth at bedtime. 09/08/18 09/08/19 Yes [provider]  naproxen (NAPROSYN) 500 MG tablet Take 500 mg by mouth 2 (two) times daily with a meal.   Yes [provider]  potassium chloride 20 MEQ TBCR Take 20 mEq by mouth daily. 01/28/19  Yes Alicia Ackert, Inetta Fermo A, FNP  torsemide (DEMADEX) 20 MG tablet Take 3 tablets (60 mg total) by mouth daily. Patient taking differently: Take 40 mg by mouth 2 (two) times  daily.  02/15/19 05/16/19 Yes Delma Freeze, FNP     Review of Systems  Constitutional: Positive for fatigue. Negative for appetite change.  HENT: Negative for congestion, rhinorrhea and sore throat.   Eyes: Negative.   Respiratory: Positive for cough, shortness of breath and wheezing.   Cardiovascular: Positive for leg swelling (improving). Negative for chest pain and palpitations.  Gastrointestinal: Negative for abdominal distention and abdominal pain.  Endocrine: Negative.   Genitourinary: Negative.   Musculoskeletal: Positive for arthralgias (left hip). Negative for back pain.  Skin: Negative.   Allergic/Immunologic: Negative.   Neurological: Positive for light-headedness and headaches. Negative for dizziness.  Hematological: Negative for adenopathy. Does not bruise/bleed easily.  Psychiatric/Behavioral: Positive for sleep disturbance (+ orthopnea at times). Negative for dysphoric mood. The patient is nervous/anxious (worsening stress).    Vitals:   03/10/19 1027  BP: 123/80  Pulse: 76  Resp: 15  SpO2: 100%  Weight: 153 lb (69.4 kg)  Height: 5\' 8"  (1.727 m)   Wt Readings from Last 3 Encounters:  03/10/19 153 lb (69.4 kg)  02/23/19 155 lb 6.4 oz (70.5 kg)  02/18/19 169 lb 3.2 oz (76.7 kg)   Lab Results  Component Value Date   CREATININE 1.17 02/15/2019   CREATININE 1.12 02/11/2019   CREATININE 0.91 02/07/2019     Physical Exam Vitals signs and nursing note reviewed.  Constitutional:      Appearance: He is well-developed.  HENT:     Head: Normocephalic and atraumatic.  Neck:     Musculoskeletal: Normal range of motion and neck supple.     Vascular: No JVD.  Cardiovascular:     Rate and Rhythm: Normal rate. Rhythm irregular.  Pulmonary:     Effort: Pulmonary effort is normal. No respiratory distress.     Breath sounds: Examination of the right-upper field reveals wheezing. Examination of the left-upper field reveals wheezing. Examination of the left-lower field  reveals wheezing. Wheezing present. No rales.  Abdominal:     General: There is no distension.     Tenderness: There is no abdominal tenderness.  Musculoskeletal:     Right lower leg: He exhibits no tenderness. No edema.     Left lower leg: He exhibits no tenderness. No edema.  Skin:    General: Skin is warm and dry.  Neurological:     General: No focal deficit present.     Mental Status: He is alert and oriented to person, place, and time.  Psychiatric:        Mood and Affect: Mood is anxious.        Behavior: Behavior normal.    Assessment & Plan:  1: Chronic heart failure with mildly reduced ejection fraction- - NYHA class III - euvolemic today - weighing daily; reminded to call for an overnight weight gain of >2 pounds or a weekly weight gain of >5 pounds - weight down 16 pounds from last visit here three weeks ago - taking torsemide 40mg  BID; discussed that  if his weight continues to decline, to call back as we may need to decrease diuretic before he returns - not adding salt and tries to eat low sodium foods - participating in paramedicine program  - BNP 02/15/2019 was 1665.0  2: COPD/ pulmonary HTN- - pulmonology saw patient during recent admission; "likely group 2 and 3 secondary to cardiac disease and chronic COPD" - has albuterol inhaler for PRN use - patient says that he has pulmonology appointment in 2 days  3: HTN- - BP looks good today - [facility] - BMP from 02/25/2019 reviewed and showed sodium 142, potassium 3.5, creatinine 1.25 and GFR 72  4: Tobacco use- - currently smokes 3-4 cigarettes daily - endorses alcohol use but says that it's "a little bit" and was unwilling to quantify - complete cessation discussed for 3 minutes with him  5: Valvular disease- - had aortic valve replaced 1990 with repeat replacement in 2000 - saw cardiology ([facility]) 10/10/17; instructed to make a follow-up appointment  6: Lymphedema- - stage 2 - resolved;  no further edema  Medication bottles were reviewed.   Return in 1 month or sooner for any questions/problems before then.

## 2019-03-10 NOTE — Patient Instructions (Signed)
Continue weighing daily and call for an overnight weight gain of > 2 pounds or a weekly weight gain of >5 pounds. 

## 2019-03-18 ENCOUNTER — Telehealth (HOSPITAL_COMMUNITY): Payer: Self-pay

## 2019-03-18 NOTE — Telephone Encounter (Signed)
Today was a telephone visit with [name]  He states he is feeling better, breathing has improved some.  He still has some swelling in left ankle area and hurts.  Not red to tough or hot feeling.  He is not wearing ted hose, advised him to go back wearing them to see if helps.  He says his weight is down to 151 lbs.  He has all his medications and verified them.  He says he has been having a lot of test done to check his lungs and now they are checking his kidneys and liver.  He is trying to quit smoking, he is cutting back every few days.  He states it is tough, gave some encouraging words to try to help.  He states he is able to get up and go outside now without feeling so bad.  He watches his high sodium foods and fluid intake.  Will continue to visit for heart failure and medication compliance.   [place] [phone]

## 2019-03-26 LAB — ACID FAST CULTURE WITH REFLEXED SENSITIVITIES (MYCOBACTERIA): Acid Fast Culture: NEGATIVE

## 2019-04-10 NOTE — Progress Notes (Deleted)
 Patient ID: [name], male    DOB: 05/26/1960, 59 y.o.   MRN: [id number]  HPI  [name] is a 59 y/o male with a history of HTN, COPD, alcohol abuse, bipolar, pulmonary HTN, valvular heart disease, current tobacco use and chronic heart failure.   Echo report from 02/04/2019 reviewed and showed an EF of 40-45% along with moderate/severe Mike/TR, mild AR/PR, mild mitral stenosis and pulmonary HTN. Echo report from 01/07/2019 reviewed and showed an EF of 50-55% along with moderate Mike, mild AR/PR and severe TR along with severely elevated PA pressure.   Admitted 01/30/2019 due to acute on chronic HF. Cardiology, pulmonology, palliative care, GI and nephrology consults were obtained. Initially given IV lasix and then adjusted due to worsening renal function and then transitioned to oral diuretics. Discharged after 8 days. Was in the ED 01/21/2019 due to acute on chronic HF. Was given IV lasix with diuresis >1L. Admitted 01/06/2019 due to pleural effusion and acute on chronic HF. Cardiology consult obtained. Initially given IV lasix and then transitioned to oral diuretics. Discharged after 2 days. Was in the ED 12/28/2018 due to bronchitis where he was treated and released.   He presents today for a follow-up visit with a chief complaint of   Past Medical History:  Diagnosis Date  . Alcohol abuse   . Bipolar 1 disorder ([facility])   . CHF (congestive heart failure) ([facility])   . COPD (chronic obstructive pulmonary disease) ([facility])   . Hip pain   . Hypertension   . Pulmonary HTN ([facility])   . Valvular heart disease    Past Surgical History:  Procedure Laterality Date  . AORTIC VALVE REPLACEMENT     No family history on file. Social History   Tobacco Use  . Smoking status: Current Every Day Smoker    Packs/day: 1.00    Types: Cigarettes  . Smokeless tobacco: Never Used  Substance Use Topics  . Alcohol use: Yes   No Known Allergies     Review of Systems  Constitutional: Positive for fatigue.  Negative for appetite change.  HENT: Negative for congestion, rhinorrhea and sore throat.   Eyes: Negative.   Respiratory: Positive for cough, shortness of breath and wheezing.   Cardiovascular: Positive for leg swelling (improving). Negative for chest pain and palpitations.  Gastrointestinal: Negative for abdominal distention and abdominal pain.  Endocrine: Negative.   Genitourinary: Negative.   Musculoskeletal: Positive for arthralgias (left hip). Negative for back pain.  Skin: Negative.   Allergic/Immunologic: Negative.   Neurological: Positive for light-headedness and headaches. Negative for dizziness.  Hematological: Negative for adenopathy. Does not bruise/bleed easily.  Psychiatric/Behavioral: Positive for sleep disturbance (+ orthopnea at times). Negative for dysphoric mood. The patient is nervous/anxious (worsening stress).       Physical Exam Vitals and nursing note reviewed.  Constitutional:      Appearance: He is well-developed.  HENT:     Head: Normocephalic and atraumatic.  Neck:     Vascular: No JVD.  Cardiovascular:     Rate and Rhythm: Normal rate. Rhythm irregular.  Pulmonary:     Effort: Pulmonary effort is normal. No respiratory distress.     Breath sounds: Examination of the right-upper field reveals wheezing. Examination of the left-upper field reveals wheezing. Examination of the left-lower field reveals wheezing. Wheezing present. No rales.  Abdominal:     General: There is no distension.     Tenderness: There is no abdominal tenderness.  Musculoskeletal:     Cervical   back: Normal range of motion and neck supple.     Right lower leg: No tenderness. No edema.     Left lower leg: No tenderness. No edema.  Skin:    General: Skin is warm and dry.  Neurological:     General: No focal deficit present.     Mental Status: He is alert and oriented to person, place, and time.  Psychiatric:        Mood and Affect: Mood is anxious.        Behavior: Behavior  normal.    Assessment & Plan:  1: Chronic heart failure with mildly reduced ejection fraction- - NYHA class III - euvolemic today - weighing daily; reminded to call for an overnight weight gain of >2 pounds or a weekly weight gain of >5 pounds - weight 153 pounds from last visit here 1 month ago - taking torsemide 40mg  BID; discussed that if his weight continues to decline, to call back as we may need to decrease diuretic before he returns - not adding salt and tries to eat low sodium foods - participating in paramedicine program  - BNP 02/15/2019 was 1665.0  2: COPD/ pulmonary HTN- - pulmonology saw patient during recent admission; "likely group 2 and 3 secondary to cardiac disease and chronic COPD" - has albuterol inhaler for PRN use - patient says that he has pulmonology appointment in 2 days  3: HTN- - BP  - saw PCP @ [facility] 02/25/2019 - BMP from 02/25/2019 reviewed and showed sodium 142, potassium 3.5, creatinine 1.25 and GFR 72  4: Tobacco use- - currently smokes 3-4 cigarettes daily - endorses alcohol use but says that it's "a little bit" and was unwilling to quantify - complete cessation discussed for 3 minutes with him  5: Valvular disease- - had aortic valve replaced 1990 with repeat replacement in 2000 - saw cardiology ([facility]) 10/10/17; instructed to make a follow-up appointment  6: Lymphedema- - stage 2 - resolved; no further edema  Medication bottles were reviewed.

## 2019-04-12 ENCOUNTER — Ambulatory Visit: Payer: Medicaid Other | Admitting: Family

## 2019-04-13 ENCOUNTER — Telehealth (HOSPITAL_COMMUNITY): Payer: Self-pay

## 2019-04-13 NOTE — Telephone Encounter (Signed)
Contacted [name] and reminded him of his HF appt tomorrow.  He is aware and stated he will try to make it.  He is calling for a ride to get there.  He says he has all his cardiac meds and his weight has been staying around 150's.  He denies any chest pain, he says still has swelling in lower extremities, goes away at night and comes back during the day.  He is trying to get clearance for hip surgery.  Discussed diet and low sodium foods.  He says he likes sweets.  Advised him importance of eating healthier to get all this fluid off.  He says he needs to eat more vegetables.  Will continue to educate on Heart Failure, diet and medication compliance.   Earmon [place] EMT-Paramedic [phone]

## 2019-04-13 NOTE — Progress Notes (Deleted)
Patient ID: ANOTHONY BURSCH, male    DOB: 01/23/1961, 59 y.o.   MRN: [id number]  HPI  Mr Salguero is a 59 y/o male with a history of HTN, COPD, alcohol abuse, bipolar, pulmonary HTN, valvular heart disease, current tobacco use and chronic heart failure.   Echo report from 02/04/2019 reviewed and showed an EF of 40-45% along with moderate/severe MR/TR, mild AR/PR, mild mitral stenosis and pulmonary HTN. Echo report from 01/07/2019 reviewed and showed an EF of 50-55% along with moderate MR, mild AR/PR and severe TR along with severely elevated PA pressure.   Admitted 01/30/2019 due to acute on chronic HF. Cardiology, pulmonology, palliative care, GI and nephrology consults were obtained. Initially given IV lasix and then adjusted due to worsening renal function and then transitioned to oral diuretics. Discharged after 8 days. Was in the ED 01/21/2019 due to acute on chronic HF. Was given IV lasix with diuresis >1L. Admitted 01/06/2019 due to pleural effusion and acute on chronic HF. Cardiology consult obtained. Initially given IV lasix and then transitioned to oral diuretics. Discharged after 2 days. Was in the ED 12/28/2018 due to bronchitis where he was treated and released.   He presents today for a follow-up visit with a chief complaint of   Past Medical History:  Diagnosis Date  . Alcohol abuse   . Bipolar 1 disorder ([place])   . CHF (congestive heart failure) ([place])   . COPD (chronic obstructive pulmonary disease) ([place])   . Hip pain   . Hypertension   . Pulmonary HTN ([place])   . Valvular heart disease    Past Surgical History:  Procedure Laterality Date  . AORTIC VALVE REPLACEMENT     No family history on file. Social History   Tobacco Use  . Smoking status: Current Every Day Smoker    Packs/day: 1.00    Types: Cigarettes  . Smokeless tobacco: Never Used  Substance Use Topics  . Alcohol use: Yes   No Known Allergies     Review of Systems  Constitutional: Positive for fatigue.  Negative for appetite change.  HENT: Negative for congestion, rhinorrhea and sore throat.   Eyes: Negative.   Respiratory: Positive for cough, shortness of breath and wheezing.   Cardiovascular: Positive for leg swelling (improving). Negative for chest pain and palpitations.  Gastrointestinal: Negative for abdominal distention and abdominal pain.  Endocrine: Negative.   Genitourinary: Negative.   Musculoskeletal: Positive for arthralgias (left hip). Negative for back pain.  Skin: Negative.   Allergic/Immunologic: Negative.   Neurological: Positive for light-headedness and headaches. Negative for dizziness.  Hematological: Negative for adenopathy. Does not bruise/bleed easily.  Psychiatric/Behavioral: Positive for sleep disturbance (+ orthopnea at times). Negative for dysphoric mood. The patient is nervous/anxious (worsening stress).       Physical Exam Vitals and nursing note reviewed.  Constitutional:      Appearance: He is well-developed.  HENT:     Head: Normocephalic and atraumatic.  Neck:     Vascular: No JVD.  Cardiovascular:     Rate and Rhythm: Normal rate. Rhythm irregular.  Pulmonary:     Effort: Pulmonary effort is normal. No respiratory distress.     Breath sounds: Examination of the right-upper field reveals wheezing. Examination of the left-upper field reveals wheezing. Examination of the left-lower field reveals wheezing. Wheezing present. No rales.  Abdominal:     General: There is no distension.     Tenderness: There is no abdominal tenderness.  Musculoskeletal:     Cervical  back: Normal range of motion and neck supple.     Right lower leg: No tenderness. No edema.     Left lower leg: No tenderness. No edema.  Skin:    General: Skin is warm and dry.  Neurological:     General: No focal deficit present.     Mental Status: He is alert and oriented to person, place, and time.  Psychiatric:        Mood and Affect: Mood is anxious.        Behavior: Behavior  normal.    Assessment & Plan:  1: Chronic heart failure with mildly reduced ejection fraction- - NYHA class III - euvolemic today - weighing daily; reminded to call for an overnight weight gain of >2 pounds or a weekly weight gain of >5 pounds - weight 153 pounds from last visit here 1 month ago - taking torsemide 40mg  BID; discussed that if his weight continues to decline, to call back as we may need to decrease diuretic before he returns - not adding salt and tries to eat low sodium foods - participating in paramedicine program  - BNP 02/15/2019 was 1665.0  2: COPD/ pulmonary HTN- - pulmonology saw patient during recent admission; "likely group 2 and 3 secondary to cardiac disease and chronic COPD" - has albuterol inhaler for PRN use - patient says that he has pulmonology appointment in 2 days  3: HTN- - BP  - saw PCP @ [facility] 02/25/2019 - BMP from 02/25/2019 reviewed and showed sodium 142, potassium 3.5, creatinine 1.25 and GFR 72  4: Tobacco use- - currently smokes 3-4 cigarettes daily - endorses alcohol use but says that it's "a little bit" and was unwilling to quantify - complete cessation discussed for 3 minutes with him  5: Valvular disease- - had aortic valve replaced 1990 with repeat replacement in 2000 - saw cardiology ([facility]) 10/10/17; instructed to make a follow-up appointment  6: Lymphedema- - stage 2 - resolved; no further edema  Medication bottles were reviewed.

## 2019-04-14 ENCOUNTER — Ambulatory Visit: Payer: Medicaid Other | Admitting: Family

## 2019-04-15 ENCOUNTER — Ambulatory Visit: Payer: Medicaid Other | Admitting: Family

## 2019-04-21 ENCOUNTER — Encounter: Payer: Self-pay | Admitting: Family

## 2019-04-21 ENCOUNTER — Ambulatory Visit: Payer: Medicaid Other | Attending: Family | Admitting: Family

## 2019-04-21 ENCOUNTER — Other Ambulatory Visit: Payer: Self-pay

## 2019-04-21 VITALS — BP 116/78 | HR 98 | Resp 16 | Ht 68.0 in | Wt 156.0 lb

## 2019-04-21 DIAGNOSIS — I509 Heart failure, unspecified: Secondary | ICD-10-CM | POA: Diagnosis not present

## 2019-04-21 DIAGNOSIS — I5022 Chronic systolic (congestive) heart failure: Secondary | ICD-10-CM

## 2019-04-21 DIAGNOSIS — I359 Nonrheumatic aortic valve disorder, unspecified: Secondary | ICD-10-CM

## 2019-04-21 DIAGNOSIS — I083 Combined rheumatic disorders of mitral, aortic and tricuspid valves: Secondary | ICD-10-CM | POA: Diagnosis not present

## 2019-04-21 DIAGNOSIS — I890 Lymphedema, not elsewhere classified: Secondary | ICD-10-CM | POA: Insufficient documentation

## 2019-04-21 DIAGNOSIS — I10 Essential (primary) hypertension: Secondary | ICD-10-CM

## 2019-04-21 DIAGNOSIS — Z720 Tobacco use: Secondary | ICD-10-CM

## 2019-04-21 DIAGNOSIS — I272 Pulmonary hypertension, unspecified: Secondary | ICD-10-CM | POA: Insufficient documentation

## 2019-04-21 DIAGNOSIS — F319 Bipolar disorder, unspecified: Secondary | ICD-10-CM | POA: Diagnosis not present

## 2019-04-21 DIAGNOSIS — F419 Anxiety disorder, unspecified: Secondary | ICD-10-CM | POA: Diagnosis not present

## 2019-04-21 DIAGNOSIS — J42 Unspecified chronic bronchitis: Secondary | ICD-10-CM

## 2019-04-21 DIAGNOSIS — Z7982 Long term (current) use of aspirin: Secondary | ICD-10-CM | POA: Insufficient documentation

## 2019-04-21 DIAGNOSIS — Z952 Presence of prosthetic heart valve: Secondary | ICD-10-CM | POA: Diagnosis not present

## 2019-04-21 DIAGNOSIS — J449 Chronic obstructive pulmonary disease, unspecified: Secondary | ICD-10-CM | POA: Diagnosis not present

## 2019-04-21 DIAGNOSIS — Z79899 Other long term (current) drug therapy: Secondary | ICD-10-CM | POA: Insufficient documentation

## 2019-04-21 DIAGNOSIS — F17210 Nicotine dependence, cigarettes, uncomplicated: Secondary | ICD-10-CM | POA: Insufficient documentation

## 2019-04-21 DIAGNOSIS — Z791 Long term (current) use of non-steroidal anti-inflammatories (NSAID): Secondary | ICD-10-CM | POA: Diagnosis not present

## 2019-04-21 DIAGNOSIS — I110 Hypertensive heart disease with heart failure: Secondary | ICD-10-CM | POA: Insufficient documentation

## 2019-04-21 LAB — GLUCOSE, CAPILLARY: Glucose-Capillary: 91 mg/dL (ref 70–99)

## 2019-04-21 NOTE — Patient Instructions (Signed)
Continue weighing daily and call for an overnight weight gain of > 2 pounds or a weekly weight gain of >5 pounds. 

## 2019-04-21 NOTE — Progress Notes (Signed)
Patient ID: [name], male    DOB: [birth year], 59 y.o.   MRN: [id number]  HPI  [name] is a 59 y/o male with a history of HTN, COPD, alcohol abuse, bipolar, pulmonary HTN, valvular heart disease, current tobacco use and chronic heart failure.   Echo report from 02/04/2019 reviewed and showed an EF of 40-45% along with moderate/severe Mike/TR, mild AR/PR, mild mitral stenosis and pulmonary HTN. Echo report from 01/07/2019 reviewed and showed an EF of 50-55% along with moderate Mike, mild AR/PR and severe TR along with severely elevated PA pressure.   Admitted 01/30/2019 due to acute on chronic HF. Cardiology, pulmonology, palliative care, GI and nephrology consults were obtained. Initially given IV lasix and then adjusted due to worsening renal function and then transitioned to oral diuretics. Discharged after 8 days. Was in the ED 01/21/2019 due to acute on chronic HF. Was given IV lasix with diuresis >1L. Admitted 01/06/2019 due to pleural effusion and acute on chronic HF. Cardiology consult obtained. Initially given IV lasix and then transitioned to oral diuretics. Discharged after 2 days. Was in the ED 12/28/2018 due to bronchitis where he was treated and released.   He presents today for a follow-up visit with a chief complaint of moderate shortness of breath upon minimal exertion. He describes this as chronic in nature having been present for several years. He has associated fatigue, cough, wheezing, pedal edema, headaches, light-headedness, anxiety and difficulty sleeping along with this.   Would like to get most of his care transferred to [facility] to make it easier on him to get transportation to his appointments. He says that he needs to start "keeping up with things better" as he gets easily confused.   Past Medical History:  Diagnosis Date  . Alcohol abuse   . Bipolar 1 disorder (HCC)   . CHF (congestive heart failure) (HCC)   . COPD (chronic obstructive pulmonary disease) (HCC)   .  Hip pain   . Hypertension   . Pulmonary HTN (HCC)   . Valvular heart disease    Past Surgical History:  Procedure Laterality Date  . AORTIC VALVE REPLACEMENT     No family history on file. Social History   Tobacco Use  . Smoking status: Current Every Day Smoker    Packs/day: 1.00    Types: Cigarettes  . Smokeless tobacco: Never Used  Substance Use Topics  . Alcohol use: Yes   No Known Allergies  Prior to Admission medications   Medication Sig Start Date End Date Taking? Authorizing Provider  acetaminophen (TYLENOL) 500 MG tablet Take 1,000 mg by mouth every 8 (eight) hours. 10/30/18 10/30/19 Yes [provider]  albuterol (VENTOLIN HFA) 108 (90 Base) MCG/ACT inhaler Inhale 2 puffs into the lungs every 4 (four) hours as needed for wheezing or shortness of breath. 01/21/19  Yes Shaune Pollack, MD  aspirin EC 81 MG tablet Take 81 mg by mouth daily.   Yes [provider]  FLUoxetine (PROZAC) 20 MG capsule Take 20 mg by mouth every morning. 08/26/18  Yes [provider]  gabapentin (NEURONTIN) 300 MG capsule Take 1 capsule (300 mg total) by mouth 3 (three) times daily. 02/15/19 02/15/20 Yes Layson Bertsch, Inetta Fermo A, FNP  ipratropium (ATROVENT HFA) 17 MCG/ACT inhaler Inhale 2 puffs into the lungs 4 (four) times daily. 02/15/19 02/15/20 Yes Sandrea Boer A, FNP  meloxicam (MOBIC) 15 MG tablet Take 15 mg by mouth daily.   Yes [provider]  mirtazapine (REMERON) 15 MG  tablet Take 15 mg by mouth at bedtime. 09/08/18 09/08/19 Yes [provider]  naproxen (NAPROSYN) 500 MG tablet Take 500 mg by mouth 2 (two) times daily with a meal.   Yes [provider]  potassium chloride 20 MEQ TBCR Take 20 mEq by mouth daily. 01/28/19  Yes Kenyonna Micek, [name] A, FNP  torsemide (DEMADEX) 20 MG tablet Take 3 tablets (60 mg total) by mouth daily. 02/15/19 05/16/19 Yes Kobyn Kray, [name] A, FNP  atorvastatin (LIPITOR) 40 MG tablet Take 40 mg by mouth daily. 10/30/18 10/30/19  [provider]  DULoxetine (CYMBALTA) 30 MG capsule Take 30 mg by mouth daily. 09/08/18   [provider]     Review of Systems  Constitutional: Positive for fatigue. Negative for appetite change.  HENT: Negative for congestion, rhinorrhea and sore throat.   Eyes: Negative.   Respiratory: Positive for cough, shortness of breath and wheezing.   Cardiovascular: Positive for leg swelling (improving). Negative for chest pain and palpitations.  Gastrointestinal: Negative for abdominal distention and abdominal pain.  Endocrine: Negative.   Genitourinary: Negative.   Musculoskeletal: Positive for arthralgias (left hip). Negative for back pain.  Skin: Negative.   Allergic/Immunologic: Negative.   Neurological: Positive for light-headedness and headaches. Negative for dizziness.  Hematological: Negative for adenopathy. Does not bruise/bleed easily.  Psychiatric/Behavioral: Positive for sleep disturbance (+ orthopnea at times). Negative for dysphoric mood. The patient is nervous/anxious (worsening stress).    Vitals:   04/21/19 0934  BP: 116/78  Pulse: 98  Resp: 16  SpO2: 100%  Weight: 156 lb (70.8 kg)  Height: 5\' 8"  (1.727 m)   Wt Readings from Last 3 Encounters:  04/21/19 156 lb (70.8 kg)  03/10/19 153 lb (69.4 kg)  02/23/19 155 lb 6.4 oz (70.5 kg)   Lab Results  Component Value Date   CREATININE 1.17 02/15/2019   CREATININE 1.12 02/11/2019   CREATININE 0.91 02/07/2019     Physical Exam Vitals and nursing note reviewed.  Constitutional:      Appearance: He is well-developed.  HENT:     Head: Normocephalic and atraumatic.  Neck:     Vascular: No JVD.  Cardiovascular:     Rate and Rhythm: Normal rate. Rhythm irregular.  Pulmonary:     Effort: Pulmonary effort is normal. No respiratory distress.     Breath sounds: Examination of the right-lower field reveals wheezing. Wheezing present. No rales.  Abdominal:     General: There is no distension.     Tenderness:  There is no abdominal tenderness.  Musculoskeletal:     Cervical back: Normal range of motion and neck supple.     Right lower leg: No tenderness. Edema (1+ pitting) present.     Left lower leg: No tenderness. Edema (2+ pitting) present.  Skin:    General: Skin is warm and dry.  Neurological:     General: No focal deficit present.     Mental Status: He is alert and oriented to person, place, and time.  Psychiatric:        Mood and Affect: Mood is anxious.        Behavior: Behavior normal.    Assessment & Plan:  1: Chronic heart failure with mildly reduced ejection fraction- - NYHA class II - euvolemic today - weighing daily; reminded to call for an overnight weight gain of >2 pounds or a weekly weight gain of >5 pounds - weight up 3 pounds from last visit here 5 weeks ago - not adding salt  and tries to eat low sodium foods - participating in paramedicine program  - would like to see local cardiology and since patient gets confused with appt locations easily, have scheduled him with [facility] on 04/27/19 - BNP 02/15/2019 was 1665.0  2: COPD/ pulmonary HTN- - pulmonology saw patient during last admission; "likely group 2 and 3 secondary to cardiac disease and chronic COPD" - has albuterol inhaler for PRN use - patient told me that he had a pulmonologist but today says that he hasn't seen anyone; once he gets established with cardiology, may help to also see pulmonology  3: HTN- - BP looks good today - saw PCP @ [facility] 02/25/2019 - BMP from 02/25/2019 reviewed and showed sodium 142, potassium 3.5, creatinine 1.25 and GFR 72 - fasting glucose in clinic today was 91  4: Tobacco use- - currently smokes 1-2 cigarettes daily - endorses alcohol use but says that it's "a little bit" and was unwilling to quantify - complete cessation discussed for 3 minutes with him  5: Valvular disease- - had aortic valve replaced 1990 with repeat replacement in 2000 - saw cardiology ([place]) 10/10/17 -  patient requesting local cardiology due to ease of getting transportation locally  6: Lymphedema- - stage 2 - edema has returned - wearing socks with a tight band; has compression socks and he says that he's going to resume wearing them - also instructed to elevate his legs when sitting for long periods of time  Medication bottles were reviewed although patient says that he has some at home that he can't remember what they are.   He continues to go to support groups for his bipolar.   Return in 1 month or sooner for any questions/problems before then.

## 2019-04-27 ENCOUNTER — Ambulatory Visit: Payer: Medicaid Other | Admitting: Cardiovascular Disease

## 2019-04-29 ENCOUNTER — Telehealth (HOSPITAL_COMMUNITY): Payer: Self-pay

## 2019-04-29 NOTE — Telephone Encounter (Signed)
Spoke with [name] and was not able to make his appt with cardioloogy a couple of days ago.  Had the appt rescheduled for next week.  He is aware of appt and states he will be able to go to it.  He was picking up all his medications that day and aware of how to take them.  Verified meds. He states still gets short of breath with walking but not as bad.  He states still gets swelling during the day.  He does not sit around with legs propped up during the day, he stays active.  He states swelling goes down at night.  Denies any chest pain, headaches or dizziness.  He has been followed by [facility] for cardiac and wants to switch to local.  Will continue to visit for heart failure.   Earmon Phoenix Jenks EMT-Paramedic [phone]

## 2019-05-05 ENCOUNTER — Encounter: Payer: Self-pay | Admitting: Internal Medicine

## 2019-05-05 ENCOUNTER — Ambulatory Visit: Payer: Medicaid Other | Attending: Internal Medicine

## 2019-05-05 ENCOUNTER — Ambulatory Visit (INDEPENDENT_AMBULATORY_CARE_PROVIDER_SITE_OTHER): Payer: Medicaid Other | Admitting: Internal Medicine

## 2019-05-05 ENCOUNTER — Other Ambulatory Visit: Payer: Self-pay

## 2019-05-05 VITALS — BP 118/62 | HR 98 | Ht 68.0 in | Wt 156.5 lb

## 2019-05-05 DIAGNOSIS — I5022 Chronic systolic (congestive) heart failure: Secondary | ICD-10-CM | POA: Diagnosis not present

## 2019-05-05 DIAGNOSIS — K7030 Alcoholic cirrhosis of liver without ascites: Secondary | ICD-10-CM

## 2019-05-05 DIAGNOSIS — R042 Hemoptysis: Secondary | ICD-10-CM

## 2019-05-05 DIAGNOSIS — Z20822 Contact with and (suspected) exposure to covid-19: Secondary | ICD-10-CM

## 2019-05-05 DIAGNOSIS — I38 Endocarditis, valve unspecified: Secondary | ICD-10-CM | POA: Diagnosis not present

## 2019-05-05 MED ORDER — METOPROLOL SUCCINATE ER 25 MG PO TB24: 12.5000 mg | ORAL_TABLET | Freq: Every day | ORAL | 2 refills | Status: AC

## 2019-05-05 NOTE — Progress Notes (Signed)
New Outpatient Visit Date: 05/05/2019  Referring Provider: Clarisa Kindred, NP [facility]  Chief Complaint: Shortness of breath and cough  HPI:  [name] is a 59 y.o. male who is being seen today for the evaluation of heart failure at the request of Clarisa Kindred, NP. He has a history of chronic HFrEF, valvular heart disease status post bioprosthetic aortic valve replacement and mitral valve repair in 2000, hypertension, COPD, bipolar disorder, hepatitis C with cirrhosis, and polysubstance abuse (tobacco, alcohol, and cocaine).  He was previously followed by [facility] cardiology but wishes to transition his care to [facility] to improve compliance with follow-up.  He was seen in the ED or admitted several times last fall at [facility] due to volume overload in the setting of acute liver failure and acute kidney injury he was seen by [facility] cardiology during that hospitalization.  Echo on 01/07/2019 showed the EF 50-55% with moderate biventricular enlargement.  RV systolic function was also moderately reduced.  Moderate mitral and severe tricuspid regurgitation were noted.  Pulmonary artery pressure was severely elevated.  Repeat echo on 02/04/2019 showed decline in LVEF to 40 to 45% mitral regurgitation was read as moderate to severe.  Pulmonary artery pressure was moderately elevated.  Today, [name] reports that he continues to have significant dyspnea as well as intermittent cough.  He notes that his nephew, with whom he lives, was diagnosed with COVID-19 about a month ago.  [name] was never tested.  He attributes many of his symptoms to his chronic heart failure, however.  He reports shortness of breath when walking short distances or just moving around in a chair, though he actually feels like this has gotten better with diuresis since his hospitalizations in the fall.  He has some mild leg edema and notes that his weight is up about 6 pounds from its nadir.  He notes intermittent sharp  left-sided chest pain that seems to move.  It is very brief, lasting a few seconds at a time and is not exertional.  If anything, he feels like it happens when he gets excited or upset.  [name] denies orthopnea and PND.  However, he has experienced intermittent hemoptysis over the last three weeks, with streaks of blood in his sputum.  He otherwise has not had any bleeding.  He remains compliant with his medications.  He has cut down his alcohol consumption but not completely stopped.  He denies using drugs since leaving the hospital in early November.  He is limited in his mobility due to aforementioned dyspnea as well as left hip pain.  He would like to undergo left hip surgery at some point has been told that is perioperative risk is too high at this point.  He is using NSAIDs on a daily basis to help with the pain.  --------------------------------------------------------------------------------------------------  Cardiovascular History & Procedures: Cardiovascular Problems:  Chronic heart failure with reduced ejection fraction  Valvular heart disease status post aortic valve replacement (bioprosthetic) and mitral valve repair  Risk Factors:  Hypertension, male gender, and polysubstance abuse  Cath/PCI:  None available  CV Surgery:  Bioprosthetic aortic valve replacement (1990)  Bioprosthetic aortic valve replacement and mitral valve repair (2000)  EP Procedures and Devices:  None available  Non-Invasive Evaluation(s):  TTE (02/04/2019): Moderately dilated left ventricle with LVEF 40-45%.  Right ventricle is mildly dilated with moderately reduced systolic function.  There is moderate biatrial enlargement.  There is moderate to severe mitral regurgitation.  Mild mitral  stenosis observed (on my review, mean gradient of 15 mmHg suggests more severe stenosis), moderate to severe tricuspid regurgitation, attic aortic valve with mild regurgitation and mildly to moderately elevated  gradient (22 mmHg).  Moderate to severe pulmonary hypertension noted (PASP 66 mmHg).  Recent CV Pertinent Labs: Lab Results  Component Value Date   INR 1.7 (H) 02/07/2019   INR 1.0 12/19/2013   BNP 1,665.0 (H) 02/15/2019   K 3.8 02/15/2019   K 3.8 01/01/2014   MG 2.2 02/01/2019   MG 1.7 (L) 12/19/2013   BUN 28 (H) 02/15/2019   BUN 4 (L) 01/01/2014   CREATININE 1.17 02/15/2019   CREATININE 0.87 01/01/2014    --------------------------------------------------------------------------------------------------  Past Medical History:  Diagnosis Date  . Alcohol abuse   . Bipolar 1 disorder (HCC)   . CHF (congestive heart failure) (HCC)   . COPD (chronic obstructive pulmonary disease) (HCC)   . Hip pain   . Hypertension   . Pulmonary HTN (HCC)   . Valvular heart disease     Past Surgical History:  Procedure Laterality Date  . AORTIC VALVE REPLACEMENT      Current Meds  Medication Sig  . acetaminophen (TYLENOL) 500 MG tablet Take 1,000 mg by mouth every 8 (eight) hours.  Marland Kitchen albuterol (VENTOLIN HFA) 108 (90 Base) MCG/ACT inhaler Inhale 2 puffs into the lungs every 4 (four) hours as needed for wheezing or shortness of breath.  Marland Kitchen aspirin EC 81 MG tablet Take 81 mg by mouth daily.  Marland Kitchen atorvastatin (LIPITOR) 40 MG tablet Take 40 mg by mouth daily.  . DULoxetine (CYMBALTA) 30 MG capsule Take 30 mg by mouth daily.  Marland Kitchen FLUoxetine (PROZAC) 20 MG capsule Take 20 mg by mouth every morning.  . gabapentin (NEURONTIN) 300 MG capsule Take 1 capsule (300 mg total) by mouth 3 (three) times daily.  Marland Kitchen ipratropium (ATROVENT HFA) 17 MCG/ACT inhaler Inhale 2 puffs into the lungs 4 (four) times daily.  . meloxicam (MOBIC) 15 MG tablet Take 15 mg by mouth daily.  . mirtazapine (REMERON) 15 MG tablet Take 15 mg by mouth at bedtime.  . naproxen (NAPROSYN) 500 MG tablet Take 500 mg by mouth 2 (two) times daily with a meal.  . potassium chloride 20 MEQ TBCR Take 20 mEq by mouth daily.  Marland Kitchen torsemide  (DEMADEX) 20 MG tablet Take 3 tablets (60 mg total) by mouth daily.    Allergies: Patient has no known allergies.  Social History   Tobacco Use  . Smoking status: Current Every Day Smoker    Packs/day: 0.25    Types: Cigarettes  . Smokeless tobacco: Never Used  Substance Use Topics  . Alcohol use: Yes    Alcohol/week: 1.0 standard drinks    Types: 1 Cans of beer per week  . Drug use: Not Currently    Types: Cocaine, Marijuana    Comment: No drugs since 02/2019    Family History  Problem Relation Age of Onset  . Heart failure Father   . Hypertension Father     Review of Systems: A 12-system review of systems was performed and was negative except as noted in the HPI.  --------------------------------------------------------------------------------------------------  Physical Exam: BP 118/62 (BP Location: Right Arm, Patient Position: Sitting, Cuff Size: Normal)   Pulse 98   Ht 5\' 8"  (1.727 m)   Wt 156 lb 8 oz (71 kg)   BMI 23.80 kg/m   General: Frail man, seated in a wheelchair. HEENT: No conjunctival pallor or scleral icterus. Facemask  in place. Neck: Supple without lymphadenopathy, thyromegaly JVP approximately 8 cm without HJR.  No carotid bruit. Lungs: Normal work of breathing.  Mildly diminished breath sounds throughout without wheezes or crackles. Heart: Irregular rhythm with 2/6 systolic and diastolic murmurs.  No rubs or gallops.  Nondisplaced PMI.  Well-healed median sternotomy incision. Abd: Bowel sounds present.  Firm with mild diffuse tenderness.  No obvious HSM. Ext: 1+ pretibial edema bilaterally.  2+ radial and pedal pulses. Skin: Warm and dry without rash. Neuro: CNIII-XII intact. Strength and fine-touch sensation intact in upper and lower extremities bilaterally. Psych: Normal mood and affect.  EKG: Sinus rhythm with frequent PACs versus wandering atrial pacemaker and occasional PACs.  Right axis deviation.  Mild QT prolongation.  Lab Results    Component Value Date   WBC 6.6 02/07/2019   HGB 11.2 (L) 02/07/2019   HCT 33.3 (L) 02/07/2019   MCV 76.7 (L) 02/07/2019   PLT 79 (L) 02/07/2019    Lab Results  Component Value Date   NA 134 (L) 02/15/2019   K 3.8 02/15/2019   CL 102 02/15/2019   CO2 23 02/15/2019   BUN 28 (H) 02/15/2019   CREATININE 1.17 02/15/2019   GLUCOSE 136 (H) 02/15/2019   ALT 495 (H) 02/07/2019    No results found for: CHOL, HDL, LDLCALC, LDLDIRECT, TRIG, CHOLHDL   --------------------------------------------------------------------------------------------------  ASSESSMENT AND PLAN: Chronic HFrEF: [name] reports NYHA class IV heart failure symptoms and appears mildly volume overloaded on exam today.  Volume assessment is challenging because underlying edema is likely multifactorial including heart failure, severe pulmonary hypertension, and liver disease.  I recommend continuing torsemide 60 mg daily.  I will check a CMP today and consider adding spironolactone if renal function and potassium allow.  An ACE inhibitor or ARB will need to be considered, too.  For now, we will continue metoprolol succinate 12.5 mg daily.  I encouraged [name] to limit his NSAID use.  Valvular heart disease: Recent echocardiogram showed severe MR and TR by my assessment as well as moderately elevated bioprosthetic aortic valve gradient.  I am concerned that his LV dysfunction and severe pulmonary hypertension are being largely driven by his valvular heart disease.  Based on the most recent note by his cardiologist at [facility] in 03/2019, it was felt that [name] would be a poor candidate for redo valve intervention in the setting of his alcoholic cirrhosis.  At this time, I agree that he would likely not be an operative candidate.  We will work on optimization of his heart failure, has outlined above, and continue to work on alcohol cessation and avoidance of other drugs.  Ultimately, if we are able to optimize him and he  demonstrates abstinence from alcohol, we will consider proceeding with right and left heart catheterization as well as TEE.  [name] should continue low-dose aspirin as long as he does not develop significant bleeding.  Cirrhosis: Complete alcohol cessation was encouraged.  Continue diuresis with torsemide, as above.  Consider adding spironolactone, if tolerated from a renal standpoint.  Continue follow-up with GI.  I will also check an INR today given report of recent hemoptysis.  Hemoptysis: Nonspecific but potentially related to severe pulmonary hypertension and coagulopathy.  I will check a CBC and INR today.  COVID-19 exposure: [name] was exposed to COVID-19 and complains of cough and shortness of breath.  While the symptoms could certainly be due to his underlying heart failure, Covid infection is a consideration.  I  have advised him to quarantine for at least 2 weeks.  We will refer him for COVID-19 testing today.  Follow-up: Return to clinic in 1 month.  Nelva Bush, MD 05/05/2019 8:46 PM

## 2019-05-05 NOTE — Patient Instructions (Signed)
COVID TEST at the [facility] Entrance at 10:30 am.  Medication Instructions:  Your physician has recommended you make the following change in your medication:  1- START Metoprolol succinate 12.5 mg (0.5 tablet) by mouth two times a day.  *If you need a refill on your cardiac medications before your next appointment, please call your pharmacy*  Lab Work: Your physician recommends that you return for lab work in: TODAY - CBC, INR, CMP, TSH.  If you have labs (blood work) drawn today and your tests are completely normal, you will receive your results only by: Marland Kitchen MyChart Message (if you have MyChart) OR . A paper copy in the mail If you have any lab test that is abnormal or we need to change your treatment, we will call you to review the results.  Testing/Procedures: NONE  Follow-Up: At [facility], you and your health needs are our priority.  As part of our continuing mission to provide you with exceptional heart care, we have created designated Provider Care Teams.  These Care Teams include your primary Cardiologist (physician) and Advanced Practice Providers (APPs -  Physician Assistants and Nurse Practitioners) who all work together to provide you with the care you need, when you need it.  Your next appointment:   1 month(s)  The format for your next appointment:   In Person  Provider:    You may see DR Cristal Deer END or one of the following Advanced Practice Providers on your designated Care Team:    Nicolasa Ducking, NP  Eula Listen, PA-C  Marisue Ivan, PA-C

## 2019-05-06 LAB — NOVEL CORONAVIRUS, NAA: SARS-CoV-2, NAA: NOT DETECTED

## 2019-05-18 ENCOUNTER — Telehealth: Payer: Self-pay | Admitting: Internal Medicine

## 2019-05-18 NOTE — Telephone Encounter (Signed)
No answer, VM full.... 

## 2019-05-18 NOTE — Telephone Encounter (Signed)
Spoke with patient. States he is experiencing shortness of breath with exertion and at rest. Feels it is "a little worse" than a few weeks ago. Denies swelling or chest pain. Tries to weight most days and last Friday he was 150 or 152 to his recollection. He is fatigued and coughed up blood 3 times. The last time last week was about 1/2 cup of bright red blood. None since then. He has not taken his BP/HR anytime recently.  He is still due to get lab work at [facility].  We had tried to get labs in the office but we were unsuccessful at that time. Patient will call his ride and try to go tomorrow for the lab work.  He has appointment with Michaelle Birks on 05/25/19. Routing to Dr End for review.

## 2019-05-18 NOTE — Telephone Encounter (Signed)
If shortness of breath and edema have worsened, he could try taking an extra dose of torsemide 60 mg, though it would be helpful to have labs that were ordered at our last visit.  We can try to work him in to see me or APP tomorrow after he has had STAT labs drawn in the medical mall.  If he has worsening sx in the meantime, he should call 911.  Yvonne Kendall, MD [facility]

## 2019-05-18 NOTE — Telephone Encounter (Signed)
Pt c/o Shortness Of Breath: STAT if SOB developed within the last 24 hours or pt is noticeably SOB on the phone  1. Are you currently SOB (can you hear that pt is SOB on the phone)? Yes, but spoke with a caregiver   2. How long have you been experiencing SOB? It started sometime soon after last appt 1/27 - about 2 weeks ago  3. Are you SOB when sitting or when up moving around? All the time   4. Are you currently experiencing any other symptoms? Patient is fatigued, coughing up blood  Patient requesting to schedule sooner appointment.  Scheduled first available 2/16 with Leafy Kindle.  Please call to discuss at [phone] and advise if sooner appointment is needed.

## 2019-05-18 NOTE — Telephone Encounter (Signed)
Placed hold on Dr Serita Kyle DOD slot for tomorrow. Attempted to reach patient on mobile number and no answer, VM full.  Tried home number listed and after several rings, no answer or VM. Will try again later.

## 2019-05-19 ENCOUNTER — Emergency Department: Payer: Medicaid Other

## 2019-05-19 ENCOUNTER — Telehealth: Payer: Self-pay | Admitting: Physician Assistant

## 2019-05-19 ENCOUNTER — Ambulatory Visit (INDEPENDENT_AMBULATORY_CARE_PROVIDER_SITE_OTHER): Payer: Medicaid Other | Admitting: Internal Medicine

## 2019-05-19 ENCOUNTER — Other Ambulatory Visit: Payer: Self-pay

## 2019-05-19 ENCOUNTER — Encounter: Payer: Self-pay | Admitting: Internal Medicine

## 2019-05-19 ENCOUNTER — Other Ambulatory Visit
Admission: RE | Admit: 2019-05-19 | Discharge: 2019-05-19 | Disposition: A | Discharge status: Home / Self Care | Payer: Medicaid Other | Source: Ambulatory Visit | Attending: Internal Medicine | Admitting: Internal Medicine

## 2019-05-19 ENCOUNTER — Other Ambulatory Visit: Payer: Self-pay | Admitting: *Deleted

## 2019-05-19 ENCOUNTER — Inpatient Hospital Stay
Admission: EM | Admit: 2019-05-19 | Discharge: 2019-06-07 | DRG: 432 | Disposition: E | Payer: Medicaid Other | Source: Ambulatory Visit | Attending: Student | Admitting: Student

## 2019-05-19 VITALS — BP 110/64 | HR 114 | Ht 68.0 in | Wt 147.8 lb

## 2019-05-19 DIAGNOSIS — I10 Essential (primary) hypertension: Secondary | ICD-10-CM | POA: Insufficient documentation

## 2019-05-19 DIAGNOSIS — F17210 Nicotine dependence, cigarettes, uncomplicated: Secondary | ICD-10-CM | POA: Diagnosis present

## 2019-05-19 DIAGNOSIS — I38 Endocarditis, valve unspecified: Secondary | ICD-10-CM | POA: Diagnosis not present

## 2019-05-19 DIAGNOSIS — K219 Gastro-esophageal reflux disease without esophagitis: Secondary | ICD-10-CM | POA: Diagnosis present

## 2019-05-19 DIAGNOSIS — I272 Pulmonary hypertension, unspecified: Secondary | ICD-10-CM | POA: Diagnosis not present

## 2019-05-19 DIAGNOSIS — F1020 Alcohol dependence, uncomplicated: Secondary | ICD-10-CM | POA: Diagnosis present

## 2019-05-19 DIAGNOSIS — R0602 Shortness of breath: Secondary | ICD-10-CM

## 2019-05-19 DIAGNOSIS — I5022 Chronic systolic (congestive) heart failure: Secondary | ICD-10-CM

## 2019-05-19 DIAGNOSIS — E876 Hypokalemia: Secondary | ICD-10-CM | POA: Diagnosis present

## 2019-05-19 DIAGNOSIS — I5043 Acute on chronic combined systolic (congestive) and diastolic (congestive) heart failure: Secondary | ICD-10-CM | POA: Diagnosis present

## 2019-05-19 DIAGNOSIS — Z66 Do not resuscitate: Secondary | ICD-10-CM | POA: Diagnosis not present

## 2019-05-19 DIAGNOSIS — Z20822 Contact with and (suspected) exposure to covid-19: Secondary | ICD-10-CM | POA: Diagnosis present

## 2019-05-19 DIAGNOSIS — R042 Hemoptysis: Secondary | ICD-10-CM | POA: Diagnosis present

## 2019-05-19 DIAGNOSIS — Z7982 Long term (current) use of aspirin: Secondary | ICD-10-CM

## 2019-05-19 DIAGNOSIS — I429 Cardiomyopathy, unspecified: Secondary | ICD-10-CM | POA: Diagnosis present

## 2019-05-19 DIAGNOSIS — D638 Anemia in other chronic diseases classified elsewhere: Secondary | ICD-10-CM | POA: Diagnosis present

## 2019-05-19 DIAGNOSIS — I5023 Acute on chronic systolic (congestive) heart failure: Secondary | ICD-10-CM | POA: Diagnosis not present

## 2019-05-19 DIAGNOSIS — F1410 Cocaine abuse, uncomplicated: Secondary | ICD-10-CM | POA: Diagnosis present

## 2019-05-19 DIAGNOSIS — B1920 Unspecified viral hepatitis C without hepatic coma: Secondary | ICD-10-CM | POA: Diagnosis present

## 2019-05-19 DIAGNOSIS — I959 Hypotension, unspecified: Secondary | ICD-10-CM | POA: Diagnosis present

## 2019-05-19 DIAGNOSIS — E872 Acidosis: Secondary | ICD-10-CM | POA: Diagnosis present

## 2019-05-19 DIAGNOSIS — I4892 Unspecified atrial flutter: Secondary | ICD-10-CM | POA: Diagnosis not present

## 2019-05-19 DIAGNOSIS — Z7189 Other specified counseling: Secondary | ICD-10-CM | POA: Diagnosis not present

## 2019-05-19 DIAGNOSIS — R64 Cachexia: Secondary | ICD-10-CM | POA: Diagnosis present

## 2019-05-19 DIAGNOSIS — Z791 Long term (current) use of non-steroidal anti-inflammatories (NSAID): Secondary | ICD-10-CM

## 2019-05-19 DIAGNOSIS — Z515 Encounter for palliative care: Secondary | ICD-10-CM | POA: Diagnosis not present

## 2019-05-19 DIAGNOSIS — E440 Moderate protein-calorie malnutrition: Secondary | ICD-10-CM | POA: Diagnosis present

## 2019-05-19 DIAGNOSIS — D696 Thrombocytopenia, unspecified: Secondary | ICD-10-CM | POA: Diagnosis present

## 2019-05-19 DIAGNOSIS — K7290 Hepatic failure, unspecified without coma: Secondary | ICD-10-CM | POA: Diagnosis not present

## 2019-05-19 DIAGNOSIS — I483 Typical atrial flutter: Secondary | ICD-10-CM | POA: Diagnosis present

## 2019-05-19 DIAGNOSIS — J432 Centrilobular emphysema: Secondary | ICD-10-CM | POA: Diagnosis not present

## 2019-05-19 DIAGNOSIS — Z6822 Body mass index (BMI) 22.0-22.9, adult: Secondary | ICD-10-CM

## 2019-05-19 DIAGNOSIS — E871 Hypo-osmolality and hyponatremia: Secondary | ICD-10-CM | POA: Diagnosis present

## 2019-05-19 DIAGNOSIS — F319 Bipolar disorder, unspecified: Secondary | ICD-10-CM | POA: Diagnosis present

## 2019-05-19 DIAGNOSIS — I110 Hypertensive heart disease with heart failure: Secondary | ICD-10-CM | POA: Diagnosis present

## 2019-05-19 DIAGNOSIS — Z953 Presence of xenogenic heart valve: Secondary | ICD-10-CM

## 2019-05-19 DIAGNOSIS — J449 Chronic obstructive pulmonary disease, unspecified: Secondary | ICD-10-CM | POA: Diagnosis present

## 2019-05-19 DIAGNOSIS — R739 Hyperglycemia, unspecified: Secondary | ICD-10-CM | POA: Diagnosis present

## 2019-05-19 DIAGNOSIS — I426 Alcoholic cardiomyopathy: Secondary | ICD-10-CM | POA: Diagnosis not present

## 2019-05-19 DIAGNOSIS — F1010 Alcohol abuse, uncomplicated: Secondary | ICD-10-CM | POA: Diagnosis present

## 2019-05-19 DIAGNOSIS — K7200 Acute and subacute hepatic failure without coma: Secondary | ICD-10-CM | POA: Diagnosis present

## 2019-05-19 DIAGNOSIS — K7011 Alcoholic hepatitis with ascites: Secondary | ICD-10-CM | POA: Diagnosis present

## 2019-05-19 DIAGNOSIS — E875 Hyperkalemia: Secondary | ICD-10-CM | POA: Diagnosis not present

## 2019-05-19 DIAGNOSIS — K7031 Alcoholic cirrhosis of liver with ascites: Secondary | ICD-10-CM | POA: Diagnosis present

## 2019-05-19 DIAGNOSIS — I340 Nonrheumatic mitral (valve) insufficiency: Secondary | ICD-10-CM | POA: Diagnosis not present

## 2019-05-19 DIAGNOSIS — N179 Acute kidney failure, unspecified: Secondary | ICD-10-CM | POA: Diagnosis present

## 2019-05-19 DIAGNOSIS — I081 Rheumatic disorders of both mitral and tricuspid valves: Secondary | ICD-10-CM | POA: Diagnosis present

## 2019-05-19 DIAGNOSIS — Z79899 Other long term (current) drug therapy: Secondary | ICD-10-CM

## 2019-05-19 DIAGNOSIS — I4891 Unspecified atrial fibrillation: Secondary | ICD-10-CM | POA: Diagnosis present

## 2019-05-19 DIAGNOSIS — R569 Unspecified convulsions: Secondary | ICD-10-CM | POA: Diagnosis not present

## 2019-05-19 LAB — CBC WITH DIFFERENTIAL/PLATELET
Abs Immature Granulocytes: 0.06 10*3/uL (ref 0.00–0.07)
Abs Immature Granulocytes: 0.06 10*3/uL (ref 0.00–0.07)
Basophils Absolute: 0 10*3/uL (ref 0.0–0.1)
Basophils Absolute: 0 10*3/uL (ref 0.0–0.1)
Basophils Relative: 1 %
Basophils Relative: 0 %
Eosinophils Absolute: 0.1 10*3/uL (ref 0.0–0.5)
Eosinophils Absolute: 0.1 10*3/uL (ref 0.0–0.5)
Eosinophils Relative: 1 %
Eosinophils Relative: 1 %
HCT: 37.3 % — ABNORMAL LOW (ref 39.0–52.0)
HCT: 37.9 % — ABNORMAL LOW (ref 39.0–52.0)
Hemoglobin: 11.9 g/dL — ABNORMAL LOW (ref 13.0–17.0)
Hemoglobin: 11.8 g/dL — ABNORMAL LOW (ref 13.0–17.0)
Immature Granulocytes: 1 %
Immature Granulocytes: 1 %
Lymphocytes Relative: 14 %
Lymphocytes Relative: 12 %
Lymphs Abs: 1.1 10*3/uL (ref 0.7–4.0)
Lymphs Abs: 0.8 10*3/uL (ref 0.7–4.0)
MCH: 21.3 pg — ABNORMAL LOW (ref 26.0–34.0)
MCH: 21.3 pg — ABNORMAL LOW (ref 26.0–34.0)
MCHC: 31.9 g/dL (ref 30.0–36.0)
MCHC: 31.1 g/dL (ref 30.0–36.0)
MCV: 66.8 fL — ABNORMAL LOW (ref 80.0–100.0)
MCV: 68.5 fL — ABNORMAL LOW (ref 80.0–100.0)
Monocytes Absolute: 0.7 10*3/uL (ref 0.1–1.0)
Monocytes Absolute: 0.4 10*3/uL (ref 0.1–1.0)
Monocytes Relative: 9 %
Monocytes Relative: 6 %
Neutro Abs: 5.8 10*3/uL (ref 1.7–7.7)
Neutro Abs: 5.3 10*3/uL (ref 1.7–7.7)
Neutrophils Relative %: 74 %
Neutrophils Relative %: 80 %
Platelets: 93 10*3/uL — ABNORMAL LOW (ref 150–400)
Platelets: 91 10*3/uL — ABNORMAL LOW (ref 150–400)
RBC: 5.58 MIL/uL (ref 4.22–5.81)
RBC: 5.53 MIL/uL (ref 4.22–5.81)
RDW: 25.1 % — ABNORMAL HIGH (ref 11.5–15.5)
RDW: 24.1 % — ABNORMAL HIGH (ref 11.5–15.5)
Smear Review: NORMAL
WBC: 7.8 10*3/uL (ref 4.0–10.5)
WBC: 6.7 10*3/uL (ref 4.0–10.5)
nRBC: 0 % (ref 0.0–0.2)
nRBC: 0 % (ref 0.0–0.2)

## 2019-05-19 LAB — PROTIME-INR
INR: 1.6 — ABNORMAL HIGH (ref 0.8–1.2)
INR: 1.7 — ABNORMAL HIGH (ref 0.8–1.2)
Prothrombin Time: 19.1 seconds — ABNORMAL HIGH (ref 11.4–15.2)
Prothrombin Time: 19.8 seconds — ABNORMAL HIGH (ref 11.4–15.2)

## 2019-05-19 LAB — URINALYSIS, ROUTINE W REFLEX MICROSCOPIC
Bilirubin Urine: NEGATIVE
Glucose, UA: NEGATIVE mg/dL
Hgb urine dipstick: NEGATIVE
Ketones, ur: NEGATIVE mg/dL
Leukocytes,Ua: NEGATIVE
Nitrite: NEGATIVE
Protein, ur: NEGATIVE mg/dL
Specific Gravity, Urine: 1.009 (ref 1.005–1.030)
pH: 6 (ref 5.0–8.0)

## 2019-05-19 LAB — COMPREHENSIVE METABOLIC PANEL
ALT: 34 U/L (ref 0–44)
ALT: 33 U/L (ref 0–44)
AST: 56 U/L — ABNORMAL HIGH (ref 15–41)
AST: 60 U/L — ABNORMAL HIGH (ref 15–41)
Albumin: 2.7 g/dL — ABNORMAL LOW (ref 3.5–5.0)
Albumin: 2.8 g/dL — ABNORMAL LOW (ref 3.5–5.0)
Alkaline Phosphatase: 199 U/L — ABNORMAL HIGH (ref 38–126)
Alkaline Phosphatase: 186 U/L — ABNORMAL HIGH (ref 38–126)
Anion gap: 11 (ref 5–15)
Anion gap: 14 (ref 5–15)
BUN: 38 mg/dL — ABNORMAL HIGH (ref 6–20)
BUN: 39 mg/dL — ABNORMAL HIGH (ref 6–20)
CO2: 36 mmol/L — ABNORMAL HIGH (ref 22–32)
CO2: 33 mmol/L — ABNORMAL HIGH (ref 22–32)
Calcium: 8.4 mg/dL — ABNORMAL LOW (ref 8.9–10.3)
Calcium: 8.4 mg/dL — ABNORMAL LOW (ref 8.9–10.3)
Chloride: 90 mmol/L — ABNORMAL LOW (ref 98–111)
Chloride: 90 mmol/L — ABNORMAL LOW (ref 98–111)
Creatinine, Ser: 1.76 mg/dL — ABNORMAL HIGH (ref 0.61–1.24)
Creatinine, Ser: 1.79 mg/dL — ABNORMAL HIGH (ref 0.61–1.24)
GFR calc Af Amer: 48 mL/min — ABNORMAL LOW (ref >60)
GFR calc Af Amer: 47 mL/min — ABNORMAL LOW (ref >60)
GFR calc non Af Amer: 41 mL/min — ABNORMAL LOW (ref >60)
GFR calc non Af Amer: 41 mL/min — ABNORMAL LOW (ref >60)
Glucose, Bld: 111 mg/dL — ABNORMAL HIGH (ref 70–99)
Glucose, Bld: 223 mg/dL — ABNORMAL HIGH (ref 70–99)
Potassium: 2.7 mmol/L — CL (ref 3.5–5.1)
Potassium: 2.7 mmol/L — CL (ref 3.5–5.1)
Sodium: 137 mmol/L (ref 135–145)
Sodium: 137 mmol/L (ref 135–145)
Total Bilirubin: 10.2 mg/dL — ABNORMAL HIGH (ref 0.3–1.2)
Total Bilirubin: 10.9 mg/dL — ABNORMAL HIGH (ref 0.3–1.2)
Total Protein: 7.9 g/dL (ref 6.5–8.1)
Total Protein: 8.2 g/dL — ABNORMAL HIGH (ref 6.5–8.1)

## 2019-05-19 LAB — CBC
HCT: 33.5 % — ABNORMAL LOW (ref 39.0–52.0)
Hemoglobin: 10.7 g/dL — ABNORMAL LOW (ref 13.0–17.0)
MCH: 21.4 pg — ABNORMAL LOW (ref 26.0–34.0)
MCHC: 31.9 g/dL (ref 30.0–36.0)
MCV: 67.1 fL — ABNORMAL LOW (ref 80.0–100.0)
Platelets: 85 10*3/uL — ABNORMAL LOW (ref 150–400)
RBC: 4.99 MIL/uL (ref 4.22–5.81)
RDW: 24.7 % — ABNORMAL HIGH (ref 11.5–15.5)
WBC: 6.9 10*3/uL (ref 4.0–10.5)
nRBC: 0.3 % — ABNORMAL HIGH (ref 0.0–0.2)

## 2019-05-19 LAB — TSH: TSH: 6.834 u[IU]/mL — ABNORMAL HIGH (ref 0.350–4.500)

## 2019-05-19 LAB — RESPIRATORY PANEL BY RT PCR (FLU A&B, COVID)
Influenza A by PCR: NEGATIVE
Influenza B by PCR: NEGATIVE
SARS Coronavirus 2 by RT PCR: NEGATIVE

## 2019-05-19 LAB — LACTIC ACID, PLASMA: Lactic Acid, Venous: 3 mmol/L (ref 0.5–1.9)

## 2019-05-19 LAB — TROPONIN I (HIGH SENSITIVITY): Troponin I (High Sensitivity): 49 ng/L — ABNORMAL HIGH (ref <18)

## 2019-05-19 LAB — PROCALCITONIN: Procalcitonin: 0.39 ng/mL

## 2019-05-19 LAB — MAGNESIUM: Magnesium: 1.6 mg/dL — ABNORMAL LOW (ref 1.7–2.4)

## 2019-05-19 LAB — BRAIN NATRIURETIC PEPTIDE: B Natriuretic Peptide: 1566 pg/mL — ABNORMAL HIGH (ref 0.0–100.0)

## 2019-05-19 LAB — APTT: aPTT: 30 seconds (ref 24–36)

## 2019-05-19 LAB — AMMONIA: Ammonia: 25 umol/L (ref 9–35)

## 2019-05-19 MED ORDER — ONDANSETRON HCL 4 MG PO TABS: 4.0000 mg | ORAL_TABLET | Freq: Four times a day (QID) | ORAL | Status: DC | PRN

## 2019-05-19 MED ORDER — THIAMINE HCL 100 MG PO TABS
100.0000 mg | ORAL_TABLET | Freq: Every day | ORAL | Status: DC
  Administered 2019-05-20 – 2019-05-21 (×2): 100 mg via ORAL
  Filled 2019-05-19 (×2): qty 1

## 2019-05-19 MED ORDER — ONDANSETRON HCL 4 MG/2ML IJ SOLN: 4.0000 mg | Freq: Four times a day (QID) | INTRAMUSCULAR | Status: DC | PRN

## 2019-05-19 MED ORDER — SODIUM CHLORIDE 0.9 % IV SOLN
1.0000 g | Freq: Once | INTRAVENOUS | Status: AC
  Administered 2019-05-20: 04:00:00 1 g via INTRAVENOUS
  Filled 2019-05-19: qty 1

## 2019-05-19 MED ORDER — MAGNESIUM OXIDE 400 (241.3 MG) MG PO TABS
400.0000 mg | ORAL_TABLET | Freq: Two times a day (BID) | ORAL | Status: DC
  Administered 2019-05-20 – 2019-05-25 (×13): 400 mg via ORAL
  Filled 2019-05-19 (×13): qty 1

## 2019-05-19 MED ORDER — THIAMINE HCL 100 MG PO TABS: 100.0000 mg | ORAL_TABLET | Freq: Every day | ORAL | Status: DC

## 2019-05-19 MED ORDER — MAGNESIUM SULFATE 2 GM/50ML IV SOLN
2.0000 g | Freq: Once | INTRAVENOUS | Status: AC
  Administered 2019-05-19: 2 g via INTRAVENOUS
  Filled 2019-05-19: qty 50

## 2019-05-19 MED ORDER — ADULT MULTIVITAMIN W/MINERALS CH
1.0000 | ORAL_TABLET | Freq: Every day | ORAL | Status: DC
  Administered 2019-05-20 – 2019-05-25 (×6): 1 via ORAL
  Filled 2019-05-19 (×6): qty 1

## 2019-05-19 MED ORDER — PANTOPRAZOLE SODIUM 40 MG PO TBEC
40.0000 mg | DELAYED_RELEASE_TABLET | Freq: Every day | ORAL | Status: DC
  Administered 2019-05-20 – 2019-05-25 (×6): 40 mg via ORAL
  Filled 2019-05-19 (×6): qty 1

## 2019-05-19 MED ORDER — FOLIC ACID 1 MG PO TABS: 1.0000 mg | ORAL_TABLET | Freq: Every day | ORAL | Status: DC

## 2019-05-19 MED ORDER — LORAZEPAM 1 MG PO TABS: 1.0000 mg | ORAL_TABLET | ORAL | Status: DC | PRN

## 2019-05-19 MED ORDER — THIAMINE HCL 100 MG/ML IJ SOLN: 100.0000 mg | Freq: Every day | INTRAMUSCULAR | Status: DC

## 2019-05-19 MED ORDER — FOLIC ACID 1 MG PO TABS
1.0000 mg | ORAL_TABLET | Freq: Every day | ORAL | Status: DC
  Administered 2019-05-20 – 2019-05-21 (×2): 1 mg via ORAL
  Filled 2019-05-19 (×2): qty 1

## 2019-05-19 MED ORDER — AZITHROMYCIN 250 MG PO TABS
500.0000 mg | ORAL_TABLET | Freq: Once | ORAL | Status: AC
  Administered 2019-05-19: 23:00:00 500 mg via ORAL
  Filled 2019-05-19: qty 2

## 2019-05-19 MED ORDER — THIAMINE HCL 100 MG/ML IJ SOLN
Freq: Once | INTRAVENOUS | Status: AC
  Filled 2019-05-19: qty 1000

## 2019-05-19 MED ORDER — ADULT MULTIVITAMIN W/MINERALS CH: 1.0000 | ORAL_TABLET | Freq: Every day | ORAL | Status: DC

## 2019-05-19 MED ORDER — POTASSIUM CHLORIDE CRYS ER 20 MEQ PO TBCR
40.0000 meq | EXTENDED_RELEASE_TABLET | Freq: Once | ORAL | Status: AC
  Administered 2019-05-19: 22:00:00 40 meq via ORAL
  Filled 2019-05-19: qty 2

## 2019-05-19 MED ORDER — POTASSIUM CHLORIDE 10 MEQ/100ML IV SOLN
10.0000 meq | INTRAVENOUS | Status: AC
  Administered 2019-05-19: 10 meq via INTRAVENOUS
  Filled 2019-05-19 (×2): qty 100

## 2019-05-19 MED ORDER — ALBUMIN HUMAN 5 % IV SOLN
12.5000 g | Freq: Once | INTRAVENOUS | Status: DC
  Filled 2019-05-19: qty 250

## 2019-05-19 MED ORDER — LORAZEPAM 2 MG/ML IJ SOLN: 1.0000 mg | INTRAMUSCULAR | Status: DC | PRN

## 2019-05-19 MED ORDER — SODIUM CHLORIDE 0.9 % IV BOLUS
500.0000 mL | Freq: Once | INTRAVENOUS | Status: AC
  Administered 2019-05-19: 500 mL via INTRAVENOUS

## 2019-05-19 NOTE — ED Triage Notes (Signed)
Pt to the er sent by his MD for abnormal labs. Pt says his potassium is low.

## 2019-05-19 NOTE — ED Notes (Signed)
This writer attempted blood draw in left hand. Unable to receive blood work.

## 2019-05-19 NOTE — Telephone Encounter (Signed)
Paged by answering service for critical potassium of 2.7.  His labs are severely abnormal with evidence of volume overload, kidney injury and hepatic congestion.  TSH is elevated  Patient has shortness of breath, orthopnea, PND, abdominal distention and lower extremity edema.  I have instructed patient to go to ER for IV diuresis and further evaluation.

## 2019-05-19 NOTE — Progress Notes (Signed)
Follow-up Outpatient Visit Date: 05/29/2019  Primary Care Provider: Medicine, [facility] [address] [place] [id number]  Chief Complaint: Shortness of breath  HPI:  [name] is a 59 y.o. 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  I met him two weeks ago, at which time he complained of significant dyspnea and intermittent cough in the setting of having been exposed to COVID-19.  He was sent for COVID-19 test (which was negative) and labs (patient never had these drawn.  Low-dose metoprolol was added in an effort to initiate evidence-based heart failure therapy.  He contacted our office 2 days ago complaining of worsening shortness of breath.  He was scheduled to see me today and was advised to have labs drawn before his visit.  He forgot to have them drawn.  It turns out that [name] feels about the same as when we last spoke with significant exertional dyspnea with minimal activity.  He also feels like his abdomen is still distended.  His legs remain swollen as well.  Overall, his weight is stable per his report, though scale today suggest 9 pound weight loss from his prior visits.  He never picked up the metoprolol that we ordered at his last visit.  He feels like his heart races at times.  He has not been lightheaded.  He reports intermittent cough and had hemoptysis with "1/2 cup of blood" a few days ago.  He continues to complain of severe left hip pain and is hopeful to undergo surgery at some point.  Mr. Code continues to drink at least 1-2 beers per day.   --------------------------------------------------------------------------------------------------  Cardiovascular History &  Procedures: Cardiovascular Problems:  Chronic heart failure with reduced ejection fraction  Valvular heart disease status post aortic valve replacement (bioprosthetic) and mitral valve repair  Risk Factors:  Hypertension, male gender, and polysubstance abuse  Cath/PCI:  None available  CV Surgery:  Bioprosthetic aortic valve replacement (1990)  Bioprosthetic aortic valve replacement and mitral valve repair (2000)  EP Procedures and Devices:  None available  Non-Invasive Evaluation(s):  TTE (02/04/2019): Moderately dilated left ventricle with LVEF 40-45%.  Right ventricle is mildly dilated with moderately reduced systolic function.  There is moderate biatrial enlargement.  There is moderate to severe mitral regurgitation.  Mild mitral stenosis observed (on my review, mean gradient of 15 mmHg suggests more severe stenosis), moderate to severe tricuspid regurgitation, attic aortic valve with mild regurgitation and mildly to moderately elevated gradient (22 mmHg).  Moderate to severe pulmonary hypertension noted (PASP 66 mmHg).  Recent CV Pertinent Labs: Lab Results  Component Value Date   INR 1.7 (H) 02/07/2019   INR 1.0 12/19/2013   BNP 1,665.0 (H) 02/15/2019   K 3.8 02/15/2019   K 3.8 01/01/2014   MG 2.2 02/01/2019   MG 1.7 (L) 12/19/2013   BUN 28 (H) 02/15/2019   BUN 4 (L) 01/01/2014   CREATININE 1.17 02/15/2019   CREATININE 0.87 01/01/2014    Past medical and surgical history were reviewed and updated in EPIC.  Current Meds  Medication Sig  . acetaminophen (TYLENOL) 500 MG tablet Take 1,000 mg by mouth every 8 (eight) hours.  Marland Kitchen albuterol (VENTOLIN HFA) 108 (90 Base) MCG/ACT inhaler Inhale 2 puffs into  the lungs every 4 (four) hours as needed for wheezing or shortness of breath.  Marland Kitchen aspirin EC 81 MG tablet Take 81 mg by mouth daily.  Marland Kitchen atorvastatin (LIPITOR) 40 MG tablet Take 40 mg by mouth daily.  . DULoxetine (CYMBALTA) 30 MG capsule Take 30 mg by mouth  daily.  Marland Kitchen FLUoxetine (PROZAC) 20 MG capsule Take 20 mg by mouth every morning.  . gabapentin (NEURONTIN) 300 MG capsule Take 1 capsule (300 mg total) by mouth 3 (three) times daily.  Marland Kitchen ipratropium (ATROVENT HFA) 17 MCG/ACT inhaler Inhale 2 puffs into the lungs 4 (four) times daily.  . meloxicam (MOBIC) 15 MG tablet Take 15 mg by mouth daily.  . mirtazapine (REMERON) 15 MG tablet Take 15 mg by mouth at bedtime.  . naproxen (NAPROSYN) 500 MG tablet Take 500 mg by mouth 2 (two) times daily with a meal.  . potassium chloride 20 MEQ TBCR Take 20 mEq by mouth daily.  Marland Kitchen torsemide (DEMADEX) 20 MG tablet Take 3 tablets (60 mg total) by mouth daily.    Allergies: Patient has no known allergies.  Social History   Tobacco Use  . Smoking status: Current Every Day Smoker    Packs/day: 0.25    Types: Cigarettes  . Smokeless tobacco: Never Used  Substance Use Topics  . Alcohol use: Yes    Alcohol/week: 1.0 standard drinks    Types: 1 Cans of beer per week  . Drug use: Not Currently    Types: Cocaine, Marijuana    Comment: No drugs since 02/2019    Family History  Problem Relation Age of Onset  . Heart failure Father   . Hypertension Father     Review of Systems: A 12-system review of systems was performed and was negative except as noted in the HPI.  --------------------------------------------------------------------------------------------------  Physical Exam: BP 110/64 (BP Location: Left Arm, Patient Position: Sitting, Cuff Size: Normal)   Pulse (!) 114   Ht _0  (1.727 m)   Wt 147 lb 12 oz (67 kg)   SpO2 99%   BMI 22.47 kg/m   General: Chronically ill-appearing man, seated on the exam table. Neck: JVP approximately 10 cm with positive HJR. Lungs: Normal work of breathing.  Mildly diminished breath sounds at both lung bases. Heart: Tachycardic but regular with 3/6 systolic and diastolic murmurs.  Parasternal heave noted. Abd: Bowel sounds present.  Soft and nontender.  No  obvious HSM. Ext: 1+ chronic pretibial edema bilaterally.  EKG: Atrial flutter with 2:1 conduction and isolated PVC, right axis deviation, and nonspecific ST/T changes.  Lab Results  Component Value Date   WBC 6.6 02/07/2019   HGB 11.2 (L) 02/07/2019   HCT 33.3 (L) 02/07/2019   MCV 76.7 (L) 02/07/2019   PLT 79 (L) 02/07/2019    Lab Results  Component Value Date   NA 134 (L) 02/15/2019   K 3.8 02/15/2019   CL 102 02/15/2019   CO2 23 02/15/2019   BUN 28 (H) 02/15/2019   CREATININE 1.17 02/15/2019   GLUCOSE 136 (H) 02/15/2019   ALT 495 (H) 02/07/2019    No results found for: CHOL, HDL, LDLCALC, LDLDIRECT, TRIG, CHOLHDL  --------------------------------------------------------------------------------------------------  ASSESSMENT AND PLAN: Chronic HFrEF due to valvular heart disease: Mr. Makin reports relatively stable symptoms consistent with NYHA class Reminyl for heart failure.  He still appears volume loaded on exam today despite taking torsemide 60 mg daily.  We had discussed escalation of this or addition of spironolactone at our last visit based  on lab results, though he never had labs drawn.  We will [facility] him to the medical mall for labs to be drawn today in anticipation of escalating his diuretic regimen.  If his symptoms were to worsen, he may need hospitalization, though he would like to avoid this if possible.  I advised him to pick up her previously prescribed metoprolol succinate 12.5 mg daily.  Long-term, we will need to work on adding ACE inhibitor/ARB and aldosterone antagonist.  I worry that his progressive heart failure and valvular disease and ongoing substance abuse issues with liver and kidney dysfunction may preclude him from any further surgical intervention or advanced therapy in the future.  At some point, we will need to readdress TEE and right/left heart catheterization.  Given history of cirrhosis, he would likely need GI consultation with the EGD to ensure  that it is safe to perform TEE.  Atrial flutter: Heart rate elevated with what appears to be 2: 1 atrial flutter or atrial tachycardia.  I do not think that Mr. Epps is a suitable candidate for anticoagulation given his coagulopathy from liver disease and report of significant edema phthisis.  I advised him to begin taking metoprolol, as prescribed at our prior visit.  Chronic liver disease: I will check a CMP today.  I encouraged Mr. Mader to quit consuming alcohol.  Follow-up: Return to clinic in 2 weeks.  Nelva Bush, MD 05/20/2019 8:00 PM

## 2019-05-19 NOTE — H&P (Signed)
History and Physical   [name] JEH:[id number] DOB: [birth year] DOA: 17-Jun-2019  Referring MD/NP/PA: Dr. Jari Pigg PCP: Medicine, [facility]   Outpatient Specialists: Dr. Harrell Gave End, cardiology  Patient coming from: Home  Chief Complaint: Shortness of breath and abnormal labs  HPI: [name] is a 59 y.o. male with medical history significant of valvular heart disease, pulmonary hypertension, status post bioprosthetic valve, congestive heart failure, history of mitral valve repair in 2000, hypertension, COPD, bipolar disorder, liver cirrhosis with hepatitis C, continued alcohol abuse, tobacco and cocaine abuse who was seen by his cardiologist today for regular follow-up secondary to his ongoing shortness of breath.  Patient was initially ruled out of Covid about 2 weeks ago.  At follow-up today he had lab work done that appeared abnormal.  He continues to drink 1-2 alcohols a day.  His liver function was of the chart.  Noted to be hypomagnesemic and hypokalemic as well.  Patient was sent to the emergency room for evaluation.  In the ER patient appears to have alcoholic hepatitis with no obvious ascites.  His electrolyte abnormalities were however noted.  He is weak and debilitated.  Reported having had recent hematemesis but not today.  Patient being admitted for evaluation and treatment of acute on chronic alcoholic hepatitis with electrolyte abnormalities.  ED Course: Temperature 97.8 blood pressure 160/62 pulse 114, respirate of 19 oxygen sat 98% room air.  White count 6.7 hemoglobin 11.8 and platelets 91.  Sodium 137, potassium is 2.7 chloride 90 CO2 33 BUN 39 creatinine 1.79 and calcium 8.4.  Procalcitonin is 0.39 INR 1.7 glucose 223 and lactic acid 3.0, magnesium is 1.6 COVID-19 is negative urinalysis essentially negative and ammonia is 25.  Chest x-ray suggest possible pneumonia versus atelectasis.  Patient is being admitted for further evaluation and treatment.  Review of  Systems: As per HPI otherwise 10 point review of systems negative.    Past Medical History:  Diagnosis Date  . Alcohol abuse   . Bipolar 1 disorder ([place])   . CHF (congestive heart failure) ([place])   . COPD (chronic obstructive pulmonary disease) ([place])   . Hip pain   . Hypertension   . Pulmonary HTN ([place])   . Valvular heart disease     Past Surgical History:  Procedure Laterality Date  . AORTIC VALVE REPLACEMENT       reports that he has been smoking cigarettes. He has been smoking about 0.25 packs per day. He has never used smokeless tobacco. He reports current alcohol use of about 1.0 standard drinks of alcohol per week. He reports previous drug use. Drugs: Cocaine and Marijuana.  No Known Allergies  Family History  Problem Relation Age of Onset  . Heart failure Father   . Hypertension Father      Prior to Admission medications   Medication Sig Start Date End Date Taking? Authorizing Provider  acetaminophen (TYLENOL) 500 MG tablet Take 1,000 mg by mouth every 8 (eight) hours. 10/30/18 10/30/19  [provider]  albuterol (VENTOLIN HFA) 108 (90 Base) MCG/ACT inhaler Inhale 2 puffs into the lungs every 4 (four) hours as needed for wheezing or shortness of breath. 01/21/19   Duffy Bruce, MD  aspirin EC 81 MG tablet Take 81 mg by mouth daily.    [provider]  atorvastatin (LIPITOR) 40 MG tablet Take 40 mg by mouth daily. 10/30/18 10/30/19  [provider]  DULoxetine (CYMBALTA) 30 MG capsule Take 30 mg by mouth daily. 09/08/18  [provider]  FLUoxetine (PROZAC) 20 MG capsule Take 20 mg by mouth every morning. 08/26/18   [provider]  gabapentin (NEURONTIN) 300 MG capsule Take 1 capsule (300 mg total) by mouth 3 (three) times daily. 02/15/19 02/15/20  Delma Freeze, FNP  ipratropium (ATROVENT HFA) 17 MCG/ACT inhaler Inhale 2 puffs into the lungs 4 (four) times daily. 02/15/19 02/15/20  Delma Freeze, FNP  meloxicam (MOBIC) 15 MG  tablet Take 15 mg by mouth daily.    [provider]  metoprolol succinate (TOPROL-XL) 25 MG 24 hr tablet Take 0.5 tablets (12.5 mg total) by mouth daily. Patient not taking: Reported on 06/14/2019 05/05/19   End, Cristal Deer, MD  mirtazapine (REMERON) 15 MG tablet Take 15 mg by mouth at bedtime. 09/08/18 09/08/19  [provider]  naproxen (NAPROSYN) 500 MG tablet Take 500 mg by mouth 2 (two) times daily with a meal.    [provider]  potassium chloride 20 MEQ TBCR Take 20 mEq by mouth daily. 01/28/19   Delma Freeze, FNP  torsemide (DEMADEX) 20 MG tablet Take 3 tablets (60 mg total) by mouth daily. 02/15/19 Jun 14, 2019  Delma Freeze, FNP    Physical Exam: Vitals:   2019-06-14 1943 06/14/19 1944 2019/06/14 2212 2019-06-14 2300  BP: 109/63   116/62  Pulse: (!) 113   (!) 114  Resp: 18   19  Temp:   98 F (36.7 C) 97.8 F (36.6 C)  TempSrc:   Oral Oral  SpO2: 98%   100%  Weight:  66.7 kg    Height:  5\' 8"  (1.727 m)        Constitutional: Chronically ill looking, mildly cachectic Vitals:   14-Jun-2019 1943 Jun 14, 2019 1944 Jun 14, 2019 2212 06/14/19 2300  BP: 109/63   116/62  Pulse: (!) 113   (!) 114  Resp: 18   19  Temp:   98 F (36.7 C) 97.8 F (36.6 C)  TempSrc:   Oral Oral  SpO2: 98%   100%  Weight:  66.7 kg    Height:  5\' 8"  (1.727 m)     Eyes: PERRL, lids and conjunctivae jaundiced  ENMT: Mucous membranes are dry. Posterior pharynx clear of any exudate or lesions.Normal dentition.  Neck: normal, supple, no masses, no thyromegaly Respiratory: clear to auscultation bilaterally, no wheezing, no crackles. Normal respiratory effort. No accessory muscle use.  Cardiovascular: Regular rate and rhythm, no murmurs / rubs / gallops.  Trace extremity edema. 2+ pedal pulses. No carotid bruits.  Abdomen: no tenderness, no masses palpated. No hepatosplenomegaly. Bowel sounds positive.  No significant ascites Musculoskeletal: no clubbing / cyanosis. No joint deformity upper  and lower extremities. Good ROM, no contractures. Normal muscle tone.  Skin: no rashes, lesions, ulcers. No induration Neurologic: CN 2-12 grossly intact. Sensation intact, DTR normal. Strength 5/5 in all 4.  Psychiatric: Normal judgment and insight. Alert and oriented x 3. Normal mood.     Labs on Admission: I have personally reviewed following labs and imaging studies  CBC: Recent Labs  Lab June 14, 2019 1716 06/14/2019 2018  WBC 7.8 6.7  NEUTROABS 5.8 5.3  HGB 11.9* 11.8*  HCT 37.3* 37.9*  MCV 66.8* 68.5*  PLT 93* 91*   Basic Metabolic Panel: Recent Labs  Lab June 14, 2019 1716 06-14-2019 2018  NA 137 137  K 2.7* 2.7*  CL 90* 90*  CO2 36* 33*  GLUCOSE 111* 223*  BUN 38* 39*  CREATININE 1.76* 1.79*  CALCIUM 8.4* 8.4*  MG  --  1.6*   GFR: Estimated Creatinine Clearance: 41.9 mL/min (A) (by C-G formula based on SCr of 1.79 mg/dL (H)). Liver Function Tests: Recent Labs  Lab 05/26/2019 1716 05/25/2019 2018  AST 56* 60*  ALT 34 33  ALKPHOS 199* 186*  BILITOT 10.2* 10.9*  PROT 7.9 8.2*  ALBUMIN 2.7* 2.8*   No results for input(s): LIPASE, AMYLASE in the last 168 hours. Recent Labs  Lab 05/14/2019 2019  AMMONIA 25   Coagulation Profile: Recent Labs  Lab 05/30/2019 1716 05/21/2019 2018  INR 1.6* 1.7*   Cardiac Enzymes: No results for input(s): CKTOTAL, CKMB, CKMBINDEX, TROPONINI in the last 168 hours. BNP (last 3 results) No results for input(s): PROBNP in the last 8760 hours. HbA1C: No results for input(s): HGBA1C in the last 72 hours. CBG: No results for input(s): GLUCAP in the last 168 hours. Lipid Profile: No results for input(s): CHOL, HDL, LDLCALC, TRIG, CHOLHDL, LDLDIRECT in the last 72 hours. Thyroid Function Tests: Recent Labs    06/03/2019 1716  TSH 6.834*   Anemia Panel: No results for input(s): VITAMINB12, FOLATE, FERRITIN, TIBC, IRON, RETICCTPCT in the last 72 hours. Urine analysis:    Component Value Date/Time   COLORURINE AMBER (A) 05/30/2019 2222    APPEARANCEUR CLEAR (A) 05/15/2019 2222   APPEARANCEUR Clear 12/25/2013 0243   LABSPEC 1.009 05/10/2019 2222   LABSPEC 1.002 12/25/2013 0243   PHURINE 6.0 05/26/2019 2222   GLUCOSEU NEGATIVE 05/30/2019 2222   GLUCOSEU Negative 12/25/2013 0243   HGBUR NEGATIVE 06/03/2019 2222   BILIRUBINUR NEGATIVE 05/18/2019 2222   BILIRUBINUR Negative 12/25/2013 0243   KETONESUR NEGATIVE 05/10/2019 2222   PROTEINUR NEGATIVE 05/18/2019 2222   NITRITE NEGATIVE 06/01/2019 2222   LEUKOCYTESUR NEGATIVE 05/15/2019 2222   LEUKOCYTESUR Negative 12/25/2013 0243   Sepsis Labs: @LABRCNTIP (procalcitonin:4,lacticidven:4) ) Recent Results (from the past 240 hour(s))  Respiratory Panel by RT PCR (Flu A&B, Covid) - Nasopharyngeal Swab     Status: None   Collection Time: 05/21/2019  8:19 PM   Specimen: Nasopharyngeal Swab  Result Value Ref Range Status   SARS Coronavirus 2 by RT PCR NEGATIVE NEGATIVE Final    Comment: (NOTE) SARS-CoV-2 target nucleic acids are NOT DETECTED. The SARS-CoV-2 RNA is generally detectable in upper respiratoy specimens during the acute phase of infection. The lowest concentration of SARS-CoV-2 viral copies this assay can detect is 131 copies/mL. A negative result does not preclude SARS-Cov-2 infection and should not be used as the sole basis for treatment or other patient management decisions. A negative result may occur with  improper specimen collection/handling, submission of specimen other than nasopharyngeal swab, presence of viral mutation(s) within the areas targeted by this assay, and inadequate number of viral copies (<131 copies/mL). A negative result must be combined with clinical observations, patient history, and epidemiological information. The expected result is Negative. Fact Sheet for Patients:  07/17/19 Fact Sheet for Healthcare Providers:  https://www.moore.com/ This test is not yet ap proved or cleared by the  https://www.young.biz/ FDA and  has been authorized for detection and/or diagnosis of SARS-CoV-2 by FDA under an Emergency Use Authorization (EUA). This EUA will remain  in effect (meaning this test can be used) for the duration of the COVID-19 declaration under Section 564(b)(1) of the Act, 21 U.S.C. section 360bbb-3(b)(1), unless the authorization is terminated or revoked sooner.    Influenza A by PCR NEGATIVE NEGATIVE Final   Influenza B by PCR NEGATIVE NEGATIVE Final    Comment: (NOTE) The Xpert Xpress SARS-CoV-2/FLU/RSV assay is intended  as an aid in  the diagnosis of influenza from Nasopharyngeal swab specimens and  should not be used as a sole basis for treatment. Nasal washings and  aspirates are unacceptable for Xpert Xpress SARS-CoV-2/FLU/RSV  testing. Fact Sheet for Patients: https://www.moore.com/ Fact Sheet for Healthcare Providers: https://www.young.biz/ This test is not yet approved or cleared by the [place] FDA and  has been authorized for detection and/or diagnosis of SARS-CoV-2 by  FDA under an Emergency Use Authorization (EUA). This EUA will remain  in effect (meaning this test can be used) for the duration of the  Covid-19 declaration under Section 564(b)(1) of the Act, 21  U.S.C. section 360bbb-3(b)(1), unless the authorization is  terminated or revoked. Performed at [facility], [address] [place], [place] [address]      Radiological Exams on Admission: DG Chest Portable 1 View  Result Date: 05/21/2019 CLINICAL DATA:  Shortness of breath. Pt to the er sent by his MD for abnormal labs. Pt says his potassium is low. EXAM: PORTABLE CHEST 1 VIEW COMPARISON:  Chest radiograph 01/30/2019 FINDINGS: Stable cardiomediastinal contours with enlarged heart size status post median sternotomy. Mild diffuse bilateral interstitial opacities. There are increased heterogeneous opacities at the right lung base with associated  small right pleural effusion. There are mild linear opacities at the left lung base likely atelectasis or scarring. No pneumothorax. No acute finding in the visualized skeleton. IMPRESSION: Mild edema. Increased heterogeneous opacities at the right lung base, possibly representing atelectasis or infection, with associated small right pleural effusion. Mild opacities at the left base likely atelectasis. Electronically Signed   By: Emmaline Kluver M.D.   On: 05/18/2019 20:29    EKG: Independently reviewed.  Sinus tachycardia with no significant ST change  Assessment/Plan Principal Problem:   Hypokalemia Active Problems:   Pulmonary hypertension (HCC)   COPD (chronic obstructive pulmonary disease) (H[facility])   Bipolar 1 disorder ([facility])   AKI (acute kidney injury) (H[facility])   Hypomagnesemia   Alcoholic hepatitis with ascites   Hyperglycemia     #1 alcoholic hepatitis: LFTs largely within normal range of mildly elevated except for low albumin.  Appears to be chronic liver disease.  No significant ascites.  Patient will be monitored closely.  At this point supportive care only.  #2 hypokalemia: Profound hypokalemia most likely secondary to alcoholism.  Also hypomagnesemia.  Will replete potassium and monitor closely.  #3 hypomagnesemia: Secondary to alcohol abuse.  Replete magnesium.  #4 alcoholism: Patient will be placed on CIWA protocol.  At this point no signs of withdrawals.  #5 acute kidney injury: Probably prerenal.  Continue with follow-up mild hydration  #6 valvular heart disease: Patient saw his cardiologist today.  No evidence of decompensation.  Follow with cardiologist at discharge  #7 hyperglycemia: No known history of diabetes.  Check hemoglobin A1c.  #8 bipolar disorder: Confirm home regimen and continue   DVT prophylaxis: SCD Code Status: Full Family Communication: No family at bedside Disposition Plan: Home Consults called: None Admission status: Inpatient  Severity of  Illness: The appropriate patient status for this patient is INPATIENT. Inpatient status is judged to be reasonable and necessary in order to provide the required intensity of service to ensure the patient's safety. The patient's presenting symptoms, physical exam findings, and initial radiographic and laboratory data in the context of their chronic comorbidities is felt to place them at high risk for further clinical deterioration. Furthermore, it is not anticipated that the patient will be medically stable for discharge from  the hospital within 2 midnights of admission. The following factors support the patient status of inpatient.   " The patient's presenting symptoms include shortness of breath and electrolyte abnormalities. " The worrisome physical exam findings include cachexia with mild distention. " The initial radiographic and laboratory data are worrisome because of potassium of 2.7. " The chronic co-morbidities include chronic alcoholism and cirrhosis.   * I certify that at the point of admission it is my clinical judgment that the patient will require inpatient hospital care spanning beyond 2 midnights from the point of admission due to high intensity of service, high risk for further deterioration and high frequency of surveillance required.Lonia Blood MD [facility] Pager [phone]  If 7PM-7AM, please contact night-coverage [email or web address] Password [facility]  05/30/2019, 11:54 PM

## 2019-05-19 NOTE — ED Provider Notes (Signed)
[facility] Emergency Department Provider Note  ____________________________________________   First MD Initiated Contact with Patient 05/12/2019 1959     (approximate)  I have reviewed the triage vital signs and the nursing notes.   HISTORY  Chief Complaint abnormal labs    HPI [name] is a 59 y.o. male with chronic heart failure, valvular disease with bioprosthetic aortic valve, mitral valve repair in 2000, hypertension, COPD, bipolar, hepatitis C with cirrhosis and polysubstance abuse who comes in for abnormal lab. Pt states he came here today for abnormal blood work. His potassium was low.  Patient states that he just not been feeling well for the past few days.  He states he has not been eating just due to not feeling very well.  He has continued drink alcohol.  States he is only drinking a half a beer.  Denies withdrawals in the past.  His fatigue is constant, nothing makes better, nothing makes it worse.          Past Medical History:  Diagnosis Date  . Alcohol abuse   . Bipolar 1 disorder ([place])   . CHF (congestive heart failure) ([place])   . COPD (chronic obstructive pulmonary disease) ([place])   . Hip pain   . Hypertension   . Pulmonary HTN ([place])   . Valvular heart disease     Patient Active Problem List   Diagnosis Date Noted  . Abdominal distention   . AKI (acute kidney injury) ([place])   . Elevated LFTs   . CHF (congestive heart failure) ([place]) 01/30/2019  . Pleural effusion 01/06/2019  . Mitral regurgitation 01/06/2019  . Pulmonary hypertension ([place]) 01/06/2019  . COPD (chronic obstructive pulmonary disease) ([place]) 01/06/2019  . Bipolar 1 disorder ([place]) 01/06/2019    Past Surgical History:  Procedure Laterality Date  . AORTIC VALVE REPLACEMENT      Prior to Admission medications   Medication Sig Start Date End Date Taking? Authorizing Provider  acetaminophen (TYLENOL) 500 MG tablet Take 1,000 mg by mouth every 8 (eight) hours.  10/30/18 10/30/19  [provider]  albuterol (VENTOLIN HFA) 108 (90 Base) MCG/ACT inhaler Inhale 2 puffs into the lungs every 4 (four) hours as needed for wheezing or shortness of breath. 01/21/19   Duffy Bruce, MD  aspirin EC 81 MG tablet Take 81 mg by mouth daily.    [provider]  atorvastatin (LIPITOR) 40 MG tablet Take 40 mg by mouth daily. 10/30/18 10/30/19  [provider]  DULoxetine (CYMBALTA) 30 MG capsule Take 30 mg by mouth daily. 09/08/18   [provider]  FLUoxetine (PROZAC) 20 MG capsule Take 20 mg by mouth every morning. 08/26/18   [provider]  gabapentin (NEURONTIN) 300 MG capsule Take 1 capsule (300 mg total) by mouth 3 (three) times daily. 02/15/19 02/15/20  Alisa Graff, FNP  ipratropium (ATROVENT HFA) 17 MCG/ACT inhaler Inhale 2 puffs into the lungs 4 (four) times daily. 02/15/19 02/15/20  Alisa Graff, FNP  meloxicam (MOBIC) 15 MG tablet Take 15 mg by mouth daily.    [provider]  metoprolol succinate (TOPROL-XL) 25 MG 24 hr tablet Take 0.5 tablets (12.5 mg total) by mouth daily. Patient not taking: Reported on 06/01/2019 05/05/19   End, Harrell Gave, MD  mirtazapine (REMERON) 15 MG tablet Take 15 mg by mouth at bedtime. 09/08/18 09/08/19  [provider]  naproxen (NAPROSYN) 500 MG tablet Take 500 mg by mouth 2 (two) times daily with a meal.  [provider]  potassium chloride 20 MEQ TBCR Take 20 mEq by mouth daily. 01/28/19   Delma Freeze, FNP  torsemide (DEMADEX) 20 MG tablet Take 3 tablets (60 mg total) by mouth daily. 02/15/19 06/16/2019  Delma Freeze, FNP    Allergies Patient has no known allergies.  Family History  Problem Relation Age of Onset  . Heart failure Father   . Hypertension Father     Social History Social History   Tobacco Use  . Smoking status: Current Every Day Smoker    Packs/day: 0.25    Types: Cigarettes  . Smokeless tobacco: Never Used  Substance Use Topics    . Alcohol use: Yes    Alcohol/week: 1.0 standard drinks    Types: 1 Cans of beer per week  . Drug use: Not Currently    Types: Cocaine, Marijuana    Comment: No drugs since 02/2019      Review of Systems Constitutional: No fever/chills, fatigue Eyes: No visual changes. ENT: No sore throat. Cardiovascular: Denies chest pain. Respiratory: Denies shortness of breath. Gastrointestinal: No abdominal pain.  No nausea, no vomiting.  No diarrhea.  No constipation. Genitourinary: Negative for dysuria. Musculoskeletal: Negative for back pain. Skin: Negative for rash. Neurological: Negative for headaches, focal weakness or numbness. All other ROS negative ____________________________________________   PHYSICAL EXAM:  VITAL SIGNS: ED Triage Vitals  Enc Vitals Group     BP 06-16-2019 1943 109/63     Pulse Rate 2019/06/16 1943 (!) 113     Resp 06/16/19 1943 18     Temp --      Temp src --      SpO2 06-16-2019 1943 98 %     Weight 2019/06/16 1944 147 lb (66.7 kg)     Height Jun 16, 2019 1944 5\' 8"  (1.727 m)     Head Circumference --      Peak Flow --      Pain Score 06/16/19 1943 7     Pain Loc --      Pain Edu? --      Excl. in GC? --     Constitutional: Alert and oriented. Well appearing and in no acute distress. Eyes: . EOMI. scleral icterus Head: Atraumatic. Nose: No congestion/rhinnorhea. Mouth/Throat: Mucous membranes are dry Neck: No stridor. Trachea Midline. FROM Cardiovascular: Tachycardic, regular rhythm. Grossly normal heart sounds.  Good peripheral circulation. Respiratory: Normal respiratory effort.  No retractions. Lungs CTAB. Gastrointestinal: Soft and nontender. No distention. No abdominal bruits.  Musculoskeletal: 2+ edema bilaterally.  No joint effusions. Neurologic:  Normal speech and language. No gross focal neurologic deficits are appreciated.  Skin:  Skin is warm, dry and intact.  Patient appears jaundice Psychiatric: Mood and affect are normal. Speech and  behavior are normal. GU: Deferred   ____________________________________________   LABS (all labs ordered are listed, but only abnormal results are displayed)  Labs Reviewed  CBC WITH DIFFERENTIAL/PLATELET - Abnormal; Notable for the following components:      Result Value   Hemoglobin 11.8 (*)    HCT 37.9 (*)    MCV 68.5 (*)    MCH 21.3 (*)    RDW 24.1 (*)    Platelets 91 (*)    All other components within normal limits  MAGNESIUM - Abnormal; Notable for the following components:   Magnesium 1.6 (*)    All other components within normal limits  COMPREHENSIVE METABOLIC PANEL - Abnormal; Notable for the following components:   Potassium 2.7 (*)  Chloride 90 (*)    CO2 33 (*)    Glucose, Bld 223 (*)    BUN 39 (*)    Creatinine, Ser 1.79 (*)    Calcium 8.4 (*)    Total Protein 8.2 (*)    Albumin 2.8 (*)    AST 60 (*)    Alkaline Phosphatase 186 (*)    Total Bilirubin 10.9 (*)    GFR calc non Af Amer 41 (*)    GFR calc Af Amer 47 (*)    All other components within normal limits  PROTIME-INR - Abnormal; Notable for the following components:   Prothrombin Time 19.8 (*)    INR 1.7 (*)    All other components within normal limits  LACTIC ACID, PLASMA - Abnormal; Notable for the following components:   Lactic Acid, Venous 3.0 (*)    All other components within normal limits  TROPONIN I (HIGH SENSITIVITY) - Abnormal; Notable for the following components:   Troponin I (High Sensitivity) 49 (*)    All other components within normal limits  CULTURE, BLOOD (ROUTINE X 2)  CULTURE, BLOOD (ROUTINE X 2)  RESPIRATORY PANEL BY RT PCR (FLU A&B, COVID)  APTT  AMMONIA  LACTIC ACID, PLASMA  URINALYSIS, ROUTINE W REFLEX MICROSCOPIC  PROCALCITONIN  PROCALCITONIN   ____________________________________________   ED ECG REPORT I, Concha Se, the attending physician, personally viewed and interpreted this ECG.  EKG is sinus tachycardia rate of 114, no ST elevation, no T wave  versions, normal intervals ____________________________________________  RADIOLOGY Vela Prose, personally viewed and evaluated these images (plain radiographs) as part of my medical decision making, as well as reviewing the written report by the radiologist.  ED MD interpretation: Possible infection in the right lung  Official radiology report(s): DG Chest Portable 1 View  Result Date: 05/17/2019 CLINICAL DATA:  Shortness of breath. Pt to the er sent by his MD for abnormal labs. Pt says his potassium is low. EXAM: PORTABLE CHEST 1 VIEW COMPARISON:  Chest radiograph 01/30/2019 FINDINGS: Stable cardiomediastinal contours with enlarged heart size status post median sternotomy. Mild diffuse bilateral interstitial opacities. There are increased heterogeneous opacities at the right lung base with associated small right pleural effusion. There are mild linear opacities at the left lung base likely atelectasis or scarring. No pneumothorax. No acute finding in the visualized skeleton. IMPRESSION: Mild edema. Increased heterogeneous opacities at the right lung base, possibly representing atelectasis or infection, with associated small right pleural effusion. Mild opacities at the left base likely atelectasis. Electronically Signed   By: Emmaline Kluver M.D.   On: 05/30/2019 20:29    ____________________________________________   PROCEDURES  Procedure(s) performed (including Critical Care):  Procedures   ____________________________________________   INITIAL IMPRESSION / ASSESSMENT AND PLAN / ED COURSE  [name] was evaluated in Emergency Department on 05/14/2019 for the symptoms described in the history of present illness. He was evaluated in the context of the global COVID-19 pandemic, which necessitated consideration that the patient might be at risk for infection with the SARS-CoV-2 virus that causes COVID-19. Institutional protocols and algorithms that pertain to the evaluation of  patients at risk for COVID-19 are in a state of rapid change based on information released by regulatory bodies including the CDC and federal and state organizations. These policies and algorithms were followed during the patient's care in the ED.    Patient is a 59 year old who comes in with fatigue and not eating.  Patient noted to have very  low potassium and magnesium with new AKI.  Patient also new decompensated liver failure with a total bilirubin of 10.9.  Patient looks dry on exam and is tachycardic with an elevated lactate.  Although he does have edema a little bit of edema on his chest x-ray I think he still intravascularly dry.  We will give a small amount of 500 cc of fluid and carefully monitor respiratory status.  May benefit from some albumin.  Bedside ultrasound not show any ascites amenable to tapping.  Patient is afebrile low suspicion for spontaneous bacterial peritonitis.  Patient is x-ray was concerning for possible pneumonia versus atelectasis.  Patient's procalcitonin came back positive so we will start him on some ceftriaxone and azithromycin.  Patient mid to the hospital team for further work-up  ____________________________________________   FINAL CLINICAL IMPRESSION(S) / ED DIAGNOSES   Final diagnoses:  Acute liver failure without hepatic coma  Hypokalemia  Hypomagnesemia      MEDICATIONS GIVEN DURING THIS VISIT:  Medications  potassium chloride 10 mEq in 100 mL IVPB (10 mEq Intravenous New Bag/Given 05/30/2019 2135)  magnesium sulfate IVPB 2 g 50 mL (has no administration in time range)  cefTRIAXone (ROCEPHIN) 1 g in sodium chloride 0.9 % 100 mL IVPB (has no administration in time range)  azithromycin (ZITHROMAX) tablet 500 mg (has no administration in time range)  sodium chloride 0.9 % bolus 500 mL (500 mLs Intravenous New Bag/Given 06/06/2019 2134)  potassium chloride SA (KLOR-CON) CR tablet 40 mEq (40 mEq Oral Given 06/06/2019 2154)     ED Discharge Orders    None         Note:  This document was prepared using Dragon voice recognition software and may include unintentional dictation errors.   Concha Se, MD 05/28/2019 2219

## 2019-05-19 NOTE — Telephone Encounter (Signed)
I spoke with the patient. He is agreeable with seeing Dr. Okey Dupre this afternoon at 3:20 pm. I have advised [facility] 15-20 minutes prior to his appointment. The patient voices understanding.

## 2019-05-19 NOTE — ED Notes (Signed)
Patient was given sandwich tray and ginger ale.

## 2019-05-19 NOTE — Patient Instructions (Signed)
Medication Instructions:  Your physician has recommended you make the following change in your medication:  1- INCREASE Torsemide to 60 mg by mouth two times a day for 3 days, then return to 60 mg daily. 2- START Metoprolol succinate 12.5 mg (0.5 tablet) by mouth once a day.    *If you need a refill on your cardiac medications before your next appointment, please call your pharmacy*  Lab Work: Your physician recommends that you return for lab work in: TODAY at [facility] as you leave. CBC, CMP, INR, BNP, TSH.  If you have labs (blood work) drawn today and your tests are completely normal, you will receive your results only by: Marland Kitchen MyChart Message (if you have MyChart) OR . A paper copy in the mail If you have any lab test that is abnormal or we need to change your treatment, we will call you to review the results.  Testing/Procedures: none  Follow-Up: At [facility], you and your health needs are our priority.  As part of our continuing mission to provide you with exceptional heart care, we have created designated Provider Care Teams.  These Care Teams include your primary Cardiologist (physician) and Advanced Practice Providers (APPs -  Physician Assistants and Nurse Practitioners) who all work together to provide you with the care you need, when you need it.  Your next appointment:   2 week(s)  The format for your next appointment:   In Person  Provider:    You may see DR Cristal Deer END or one of the following Advanced Practice Providers on your designated Care Team:    Nicolasa Ducking, NP  Eula Listen, PA-C  Marisue Ivan, PA-C

## 2019-05-20 ENCOUNTER — Encounter: Payer: Self-pay | Admitting: Internal Medicine

## 2019-05-20 ENCOUNTER — Telehealth: Payer: Self-pay | Admitting: *Deleted

## 2019-05-20 ENCOUNTER — Other Ambulatory Visit: Payer: Self-pay

## 2019-05-20 ENCOUNTER — Inpatient Hospital Stay: Payer: Medicaid Other

## 2019-05-20 DIAGNOSIS — I426 Alcoholic cardiomyopathy: Secondary | ICD-10-CM

## 2019-05-20 DIAGNOSIS — I340 Nonrheumatic mitral (valve) insufficiency: Secondary | ICD-10-CM

## 2019-05-20 DIAGNOSIS — N179 Acute kidney failure, unspecified: Secondary | ICD-10-CM

## 2019-05-20 DIAGNOSIS — I483 Typical atrial flutter: Secondary | ICD-10-CM

## 2019-05-20 DIAGNOSIS — K7011 Alcoholic hepatitis with ascites: Principal | ICD-10-CM

## 2019-05-20 LAB — COMPREHENSIVE METABOLIC PANEL
ALT: 27 U/L (ref 0–44)
ALT: 27 U/L (ref 0–44)
AST: 52 U/L — ABNORMAL HIGH (ref 15–41)
AST: 48 U/L — ABNORMAL HIGH (ref 15–41)
Albumin: 2.4 g/dL — ABNORMAL LOW (ref 3.5–5.0)
Albumin: 2.5 g/dL — ABNORMAL LOW (ref 3.5–5.0)
Alkaline Phosphatase: 156 U/L — ABNORMAL HIGH (ref 38–126)
Alkaline Phosphatase: 153 U/L — ABNORMAL HIGH (ref 38–126)
Anion gap: 14 (ref 5–15)
Anion gap: 13 (ref 5–15)
BUN: 39 mg/dL — ABNORMAL HIGH (ref 6–20)
BUN: 41 mg/dL — ABNORMAL HIGH (ref 6–20)
CO2: 28 mmol/L (ref 22–32)
CO2: 28 mmol/L (ref 22–32)
Calcium: 8 mg/dL — ABNORMAL LOW (ref 8.9–10.3)
Calcium: 8.1 mg/dL — ABNORMAL LOW (ref 8.9–10.3)
Chloride: 94 mmol/L — ABNORMAL LOW (ref 98–111)
Chloride: 95 mmol/L — ABNORMAL LOW (ref 98–111)
Creatinine, Ser: 1.74 mg/dL — ABNORMAL HIGH (ref 0.61–1.24)
Creatinine, Ser: 1.84 mg/dL — ABNORMAL HIGH (ref 0.61–1.24)
GFR calc Af Amer: 49 mL/min — ABNORMAL LOW (ref >60)
GFR calc Af Amer: 45 mL/min — ABNORMAL LOW (ref >60)
GFR calc non Af Amer: 42 mL/min — ABNORMAL LOW (ref >60)
GFR calc non Af Amer: 39 mL/min — ABNORMAL LOW (ref >60)
Glucose, Bld: 178 mg/dL — ABNORMAL HIGH (ref 70–99)
Glucose, Bld: 106 mg/dL — ABNORMAL HIGH (ref 70–99)
Potassium: 2.8 mmol/L — ABNORMAL LOW (ref 3.5–5.1)
Potassium: 3.4 mmol/L — ABNORMAL LOW (ref 3.5–5.1)
Sodium: 136 mmol/L (ref 135–145)
Sodium: 136 mmol/L (ref 135–145)
Total Bilirubin: 8.9 mg/dL — ABNORMAL HIGH (ref 0.3–1.2)
Total Bilirubin: 8.2 mg/dL — ABNORMAL HIGH (ref 0.3–1.2)
Total Protein: 7.2 g/dL (ref 6.5–8.1)
Total Protein: 7.3 g/dL (ref 6.5–8.1)

## 2019-05-20 LAB — PHOSPHORUS: Phosphorus: 2.4 mg/dL — ABNORMAL LOW (ref 2.5–4.6)

## 2019-05-20 LAB — CBC
HCT: 33.9 % — ABNORMAL LOW (ref 39.0–52.0)
Hemoglobin: 10.7 g/dL — ABNORMAL LOW (ref 13.0–17.0)
MCH: 21.4 pg — ABNORMAL LOW (ref 26.0–34.0)
MCHC: 31.6 g/dL (ref 30.0–36.0)
MCV: 67.9 fL — ABNORMAL LOW (ref 80.0–100.0)
Platelets: 82 10*3/uL — ABNORMAL LOW (ref 150–400)
RBC: 4.99 MIL/uL (ref 4.22–5.81)
RDW: 24.6 % — ABNORMAL HIGH (ref 11.5–15.5)
WBC: 8.3 10*3/uL (ref 4.0–10.5)
nRBC: 0.2 % (ref 0.0–0.2)

## 2019-05-20 LAB — PROCALCITONIN: Procalcitonin: 0.38 ng/mL

## 2019-05-20 LAB — HEMOGLOBIN A1C
Hgb A1c MFr Bld: 6.4 % — ABNORMAL HIGH (ref 4.8–5.6)
Mean Plasma Glucose: 136.98 mg/dL

## 2019-05-20 LAB — LACTIC ACID, PLASMA: Lactic Acid, Venous: 3 mmol/L (ref 0.5–1.9)

## 2019-05-20 LAB — MAGNESIUM: Magnesium: 1.6 mg/dL — ABNORMAL LOW (ref 1.7–2.4)

## 2019-05-20 LAB — TROPONIN I (HIGH SENSITIVITY): Troponin I (High Sensitivity): 34 ng/L — ABNORMAL HIGH (ref <18)

## 2019-05-20 MED ORDER — POTASSIUM CHLORIDE 10 MEQ/100ML IV SOLN
10.0000 meq | INTRAVENOUS | Status: AC
  Administered 2019-05-20: 10 meq via INTRAVENOUS
  Filled 2019-05-20: qty 100

## 2019-05-20 MED ORDER — ALBUMIN HUMAN 25 % IV SOLN
12.5000 g | Freq: Once | INTRAVENOUS | Status: AC
  Administered 2019-05-20: 01:00:00 12.5 g via INTRAVENOUS
  Filled 2019-05-20: qty 50

## 2019-05-20 MED ORDER — FUROSEMIDE 10 MG/ML IJ SOLN: 40.0000 mg | Freq: Every day | INTRAMUSCULAR | Status: DC

## 2019-05-20 MED ORDER — FUROSEMIDE 10 MG/ML IJ SOLN
40.0000 mg | Freq: Two times a day (BID) | INTRAMUSCULAR | Status: DC
  Administered 2019-05-20: 09:00:00 40 mg via INTRAVENOUS
  Filled 2019-05-20: qty 4

## 2019-05-20 MED ORDER — MIRTAZAPINE 15 MG PO TABS
15.0000 mg | ORAL_TABLET | Freq: Every day | ORAL | Status: DC
  Administered 2019-05-20 – 2019-05-25 (×6): 15 mg via ORAL
  Filled 2019-05-20 (×6): qty 1

## 2019-05-20 MED ORDER — METOPROLOL TARTRATE 25 MG PO TABS
12.5000 mg | ORAL_TABLET | Freq: Two times a day (BID) | ORAL | Status: DC
  Administered 2019-05-20 (×2): 12.5 mg via ORAL
  Filled 2019-05-20 (×2): qty 1

## 2019-05-20 MED ORDER — ATORVASTATIN CALCIUM 20 MG PO TABS
40.0000 mg | ORAL_TABLET | Freq: Every day | ORAL | Status: DC
  Administered 2019-05-20 – 2019-05-24 (×5): 40 mg via ORAL
  Filled 2019-05-20 (×6): qty 2

## 2019-05-20 MED ORDER — AZITHROMYCIN 250 MG PO TABS
500.0000 mg | ORAL_TABLET | Freq: Every day | ORAL | Status: AC
  Administered 2019-05-20 – 2019-05-24 (×4): 500 mg via ORAL
  Filled 2019-05-20 (×4): qty 2

## 2019-05-20 MED ORDER — FLUOXETINE HCL 20 MG PO CAPS
20.0000 mg | ORAL_CAPSULE | Freq: Every day | ORAL | Status: DC
  Administered 2019-05-20 – 2019-05-25 (×6): 20 mg via ORAL
  Filled 2019-05-20 (×7): qty 1

## 2019-05-20 MED ORDER — ASPIRIN EC 81 MG PO TBEC
81.0000 mg | DELAYED_RELEASE_TABLET | Freq: Every day | ORAL | Status: DC
  Administered 2019-05-20 – 2019-05-24 (×5): 81 mg via ORAL
  Filled 2019-05-20 (×5): qty 1

## 2019-05-20 MED ORDER — SODIUM CHLORIDE 0.9 % IV SOLN
1.0000 g | INTRAVENOUS | Status: AC
  Administered 2019-05-20 – 2019-05-23 (×4): 1 g via INTRAVENOUS
  Filled 2019-05-20 (×3): qty 1
  Filled 2019-05-20: qty 10

## 2019-05-20 MED ORDER — POTASSIUM CHLORIDE CRYS ER 20 MEQ PO TBCR
40.0000 meq | EXTENDED_RELEASE_TABLET | Freq: Two times a day (BID) | ORAL | Status: DC
  Administered 2019-05-20 (×2): 40 meq via ORAL
  Filled 2019-05-20 (×2): qty 2

## 2019-05-20 MED ORDER — ENSURE ENLIVE PO LIQD
237.0000 mL | Freq: Three times a day (TID) | ORAL | Status: DC
  Administered 2019-05-20 – 2019-05-24 (×10): 237 mL via ORAL

## 2019-05-20 NOTE — Telephone Encounter (Signed)
Patient did go to the ED and is currently admitted.

## 2019-05-20 NOTE — Progress Notes (Signed)
Initial Nutrition Assessment  DOCUMENTATION CODES:   Not applicable  INTERVENTION:   Ensure Enlive po TID, each supplement provides 350 kcal and 20 grams of protein  Magic cup TID with meals, each supplement provides 290 kcal and 9 grams of protein  MVI, thiamine and folic acid in setting of etoh abuse   2 gram sodium diet   Low sodium diet education   Pt likely at high refeed risk; recommend monitor K, Mg and P labs daily as oral intake improves.   NUTRITION DIAGNOSIS:   Increased nutrient needs related to chronic illness(COPD, CHF) as evidenced by increased estimated needs.  GOAL:   Patient will meet greater than or equal to 90% of their needs  MONITOR:   PO intake, Supplement acceptance, Labs, Weight trends, Skin, I & O's  REASON FOR ASSESSMENT:   Malnutrition Screening Tool    ASSESSMENT:   59 y.o. male with medical history significant of valvular heart disease, pulmonary hypertension, status post bioprosthetic valve, congestive heart failure, history of mitral valve repair in 2000, hypertension, COPD, bipolar disorder, liver cirrhosis with hepatitis C, continued alcohol abuse, tobacco and cocaine abuse who is admitted with electrolyte abnormalties   RD working remotely.  Spoke with pt via phone. Pt reports poor appetite and oral intake for several days pta. Pt reports that his appetite has remained poor in hospital. Pt reports eating 50% of his breakfast today and a few bites of his lunch. RD suspects pt with poor appetite and oral intake at baseline r/t etoh abuse. Pt is willing to drink supplements in hospital. RD will add supplements between meals and to meal trays. RD will also liberalize the heart healthy portion of pt's diet as this is restrictive of protein.   Per chart, pt's weight has fluctuated from 141-173lbs over the past year; this is likely r/t fluid changes. Pt is likely at high risk for developing malnutrition but unable to diagnose at this time as  NFPE cannot be performed.  RD provided patient with low sodium diet education today; handouts with supporting information will be mailed to pt's home.    Medications reviewed and include: aspirin, azithromycin, folic acid, lasix, Mg oxide, thiamine, ceftriaxone   Labs reviewed: K 3.4(L), BUN 41(H), creat 1.84(H), P 2.4(L), Mg 1.6(L), alk phos 153(H), AST 48(H) BNP- 1566(H)- 2/10 Hgb 10.7(L), Hct 33.9(L), MCV 67.9(L), MCH 21.4(L)  Unable to complete Nutrition-Focused physical exam at this time.   Diet Order:   Diet Order            Diet 2 gram sodium Room service appropriate? Yes; Fluid consistency: Thin; Fluid restriction: 1500 mL Fluid  Diet effective now             EDUCATION NEEDS:   Education needs have been addressed  Skin:  Skin Assessment: Reviewed RN Assessment  Last BM:  2/11  Height:   Ht Readings from Last 1 Encounters:  05/20/2019 '5\' 8"'  (1.727 m)    Weight:   Wt Readings from Last 1 Encounters:  06/06/2019 66.7 kg    Ideal Body Weight:  70 kg  BMI:  Body mass index is 22.35 kg/m.  Estimated Nutritional Needs:   Kcal:  1900-2200kcal/day  Protein:  95-110g/day  Fluid:  >2L/day  Koleen Distance MS, RD, LDN Contact information available in Amion

## 2019-05-20 NOTE — Progress Notes (Addendum)
PROGRESS NOTE    DALBERT STILLINGS  GBT:[id number] DOB: 10-07-1960 DOA: 05/13/2019 PCP: Medicine, [employer] [facility]      Brief Narrative:  Mr. Ahlers is a 59 y.o. 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  Went to see his Cardiologist prior to admission, was tachypneic, tachycardic and had hypokalemia and fluid overload, sent to ER for diuresis.  In the ER, hypertensive, pulse 114, WBC 6K, platelets 90 1K, creatinine 1.8, pro-Cal 0.39, lactate 3, Covid negative, chest x-ray with pneumonia versus edema with small effusions.  Started on empiric antibiotics and admitted.      Assessment & Plan:  Acute on chronic systolic and diastolic CHF History of aortic valve repair, twice (1990, 2000) History of mitral valve repair (1990, now with moderate mitral regurgitation and stenosis) EF 40 to 45% last October. Per PCP notes, dry weight 145 pounds.  Baseline creatinine 1.2. -Furosemide 40 mg IV -K supplement -Consult Cardiology, appreciate cares -Strict I/Os, daily weights, telemetry  -Daily monitoring renal function -Lisinopril, metoprolol, spironolactone, limited by hypotension -Continue atorvastatin, aspirin   Atrial flutter, typical New onset. Rates 110s today.  Not good anticoagulation candidate, discussed with Cardiology. -Start metoprolol -Monitor on tele   Possible pneumonia Procalcitonin 0.38, pneumonia possible by chest x-ray. -Trend procalcitonin -Continue azithromycin and ceftriaxone empirically for now  Hemoptysis, chest pain -Obtain CT chest  Hypokalemia Hypomagnesemia -Supplement potassium, magnesium  Hypotension Chronic, in the context of cirrhosis.  AKI Creatinine 1.8, baseline appears to be 0.9-1.2 since last November  Cirrhosis Alcohol and hepatitis C related.  Ongoing alcohol use  described. -Obtain UDS  COPD No wheezing to suggest active bronchospasm -Continue home Trelegy  Alcohol use No signs of withdrawal at present -Continue CIWA scoring with on-demand lorazepam for signs of withdrawal -Continue thiamine and folate  Thrombocytopenia Stable relative to baseline  GERD -Continue pantoprazole   Anemia of chronic disease -Hemoglobin stable, no signs of bleeding  Depression -Continue home fluoxetine       Disposition: The patient was admitted with dyspnea and orthopnea from congestive heart failure.  Continued inpatient cares are reasonable and necessary given given it is low blood pressure, renal insufficiency (which is new, greater than 0.3 up from his baseline, and persists even after 24 hours), need for close monitoring of electrolytes, and his comorbid cirrhosis.           MDM: The below labs and imaging reports were reviewed and summarized above.  Medication management as above.  This is a severe exacerbation of his chronic disease  DVT prophylaxis: SCDs Code Status: Full code Family Communication:     Consultants:   Cardiology  Procedures:   2/10 chest x-ray-right lower lobe opacity, possible pneumonia  -2/11 CT chest-pending  Antimicrobials:   Ceftriaxone and azithromycin 2/11>>  Culture data:   2/10 blood culture x2-no growth to date          Subjective: Patient still feeling very weak, dyspneic, out of breath.  He still feels swollen.  No confusion, fever.  He coughed up "1/2 cup of blood" 1 week ago, no hemoptysis overnight or since then.  No melena or hematochezia.  Objective: Vitals:   05/20/19 0359 05/20/19 0538 05/20/19 0814 05/20/19 1123  BP: 98/70 92/63 106/67 107/73  Pulse: (!) 114 (!) 114 (!) 113 (!) 114  Resp: 20  18  19  Temp: (!) 97.4 F (36.3 C) 97.8 F (36.6 C) 98.2 F (36.8 C)   TempSrc: Oral  Oral   SpO2: 95% 100% 99% 94%  Weight:      Height:        Intake/Output Summary  (Last 24 hours) at 05/20/2019 1255 Last data filed at 05/20/2019 0915 Gross per 24 hour  Intake 576.07 ml  Output 250 ml  Net 326.07 ml   Filed Weights   2019/06/10 1944  Weight: 66.7 kg    Examination: General appearance: Thin adult male, alert and in mild respiratory distress.   HEENT: Anicteric, conjunctiva pink, lids and lashes normal. No nasal deformity, discharge, epistaxis.  Lips moist, partially edentulous, oropharynx tacky dry, no oral lesions, hearing normal.   Skin: Warm and dry.   No suspicious rashes or lesions. Cardiac: Tachycardic, regular, nl S1-S2, soft SEM.  Capillary refill is brisk.  JVP elevated.  Trace LE edema.  Radial pulses 2+ and symmetric. Respiratory: Tachypneic without accessory muscle use, lung sounds diminished bilaterally, no wheezing.  No rales appreciated.. Abdomen: Abdomen soft.  Right-sided tenderness to palpation, and paraspinally noted, guarding, no tense ascites, but a moderate amount of ascites is noted Neuro: Awake and alert.  EOMI, moves all extremities with normal strength and coordination. Speech fluent.    Psych: Sensorium intact and responding to questions, attention normal. Affect normal.  Judgment and insight appear normal.    Data Reviewed: I have personally reviewed following labs and imaging studies:  CBC: Recent Labs  Lab 06/10/19 1716 2019-06-10 2018 06/10/19 2329 05/20/19 0521  WBC 7.8 6.7 6.9 8.3  NEUTROABS 5.8 5.3  --   --   HGB 11.9* 11.8* 10.7* 10.7*  HCT 37.3* 37.9* 33.5* 33.9*  MCV 66.8* 68.5* 67.1* 67.9*  PLT 93* 91* 85* 82*   Basic Metabolic Panel: Recent Labs  Lab 10-Jun-2019 1716 June 10, 2019 2018 06-10-19 2329 05/20/19 0521  NA 137 137 136 136  K 2.7* 2.7* 2.8* 3.4*  CL 90* 90* 94* 95*  CO2 36* 33* 28 28  GLUCOSE 111* 223* 178* 106*  BUN 38* 39* 39* 41*  CREATININE 1.76* 1.79* 1.74* 1.84*  CALCIUM 8.4* 8.4* 8.0* 8.1*  MG  --  1.6* 1.6*  --   PHOS  --   --  2.4*  --    GFR: Estimated Creatinine Clearance:  40.8 mL/min (A) (by C-G formula based on SCr of 1.84 mg/dL (H)). Liver Function Tests: Recent Labs  Lab 10-Jun-2019 1716 06/10/19 2018 June 10, 2019 2329 05/20/19 0521  AST 56* 60* 52* 48*  ALT 34 33 27 27  ALKPHOS 199* 186* 156* 153*  BILITOT 10.2* 10.9* 8.9* 8.2*  PROT 7.9 8.2* 7.2 7.3  ALBUMIN 2.7* 2.8* 2.4* 2.5*   No results for input(s): LIPASE, AMYLASE in the last 168 hours. Recent Labs  Lab Jun 10, 2019 2019  AMMONIA 25   Coagulation Profile: Recent Labs  Lab 06-10-19 1716 Jun 10, 2019 2018  INR 1.6* 1.7*   Cardiac Enzymes: No results for input(s): CKTOTAL, CKMB, CKMBINDEX, TROPONINI in the last 168 hours. BNP (last 3 results) No results for input(s): PROBNP in the last 8760 hours. HbA1C: Recent Labs    2019-06-10 2329  HGBA1C 6.4*   CBG: No results for input(s): GLUCAP in the last 168 hours. Lipid Profile: No results for input(s): CHOL, HDL, LDLCALC, TRIG, CHOLHDL, LDLDIRECT in the last 72 hours. Thyroid Function Tests: Recent Labs    06-10-2019 1716  TSH 6.834*   Anemia Panel: No results for input(s):  VITAMINB12, FOLATE, FERRITIN, TIBC, IRON, RETICCTPCT in the last 72 hours. Urine analysis:    Component Value Date/Time   COLORURINE AMBER (A) 05/12/2019 2222   APPEARANCEUR CLEAR (A) 05/15/2019 2222   APPEARANCEUR Clear 12/25/2013 0243   LABSPEC 1.009 05/25/2019 2222   LABSPEC 1.002 12/25/2013 0243   PHURINE 6.0 05/18/2019 2222   GLUCOSEU NEGATIVE 06/06/2019 2222   GLUCOSEU Negative 12/25/2013 0243   HGBUR NEGATIVE 05/17/2019 2222   BILIRUBINUR NEGATIVE 06/06/2019 2222   BILIRUBINUR Negative 12/25/2013 [place] 05/25/2019 2222   PROTEINUR NEGATIVE 05/29/2019 2222   NITRITE NEGATIVE 05/28/2019 2222   LEUKOCYTESUR NEGATIVE 05/30/2019 2222   LEUKOCYTESUR Negative 12/25/2013 0243   Sepsis Labs: @LABRCNTIP (procalcitonin:4,lacticacidven:4)  ) Recent Results (from the past 240 hour(s))  Blood culture (routine x 2)     Status: None (Preliminary  result)   Collection Time: 05/17/2019  8:18 PM   Specimen: BLOOD  Result Value Ref Range Status   Specimen Description BLOOD RIGHT FOREARM  Final   Special Requests   Final    BOTTLES DRAWN AEROBIC AND ANAEROBIC Blood Culture adequate volume   Culture   Final    NO GROWTH < 12 HOURS Performed at [facility], [address] [place], [place] [address]    Report Status PENDING  Incomplete  Blood culture (routine x 2)     Status: None (Preliminary result)   Collection Time: 06/04/2019  8:18 PM   Specimen: BLOOD  Result Value Ref Range Status   Specimen Description BLOOD RIGHT ANTECUBITAL  Final   Special Requests   Final    BOTTLES DRAWN AEROBIC AND ANAEROBIC Blood Culture adequate volume   Culture   Final    NO GROWTH < 12 HOURS Performed at [facility], [address] [place], [place] [address]    Report Status PENDING  Incomplete  Respiratory Panel by RT PCR (Flu A&B, Covid) - Nasopharyngeal Swab     Status: None   Collection Time: 05/13/2019  8:19 PM   Specimen: Nasopharyngeal Swab  Result Value Ref Range Status   SARS Coronavirus 2 by RT PCR NEGATIVE NEGATIVE Final    Comment: (NOTE) SARS-CoV-2 target nucleic acids are NOT DETECTED. The SARS-CoV-2 RNA is generally detectable in upper respiratoy specimens during the acute phase of infection. The lowest concentration of SARS-CoV-2 viral copies this assay can detect is 131 copies/mL. A negative result does not preclude SARS-Cov-2 infection and should not be used as the sole basis for treatment or other patient management decisions. A negative result may occur with  improper specimen collection/handling, submission of specimen other than nasopharyngeal swab, presence of viral mutation(s) within the areas targeted by this assay, and inadequate number of viral copies (<131 copies/mL). A negative result must be combined with clinical observations, patient history, and epidemiological information. The expected  result is Negative. Fact Sheet for Patients:  PinkCheek.be Fact Sheet for Healthcare Providers:  GravelBags.it This test is not yet ap proved or cleared by the [place] FDA and  has been authorized for detection and/or diagnosis of SARS-CoV-2 by FDA under an Emergency Use Authorization (EUA). This EUA will remain  in effect (meaning this test can be used) for the duration of the COVID-19 declaration under Section 564(b)(1) of the Act, 21 U.S.C. section 360bbb-3(b)(1), unless the authorization is terminated or revoked sooner.    Influenza A by PCR NEGATIVE NEGATIVE Final   Influenza B by PCR NEGATIVE NEGATIVE Final    Comment: (NOTE) The Xpert  Xpress SARS-CoV-2/FLU/RSV assay is intended as an aid in  the diagnosis of influenza from Nasopharyngeal swab specimens and  should not be used as a sole basis for treatment. Nasal washings and  aspirates are unacceptable for Xpert Xpress SARS-CoV-2/FLU/RSV  testing. Fact Sheet for Patients: https://www.moore.com/ Fact Sheet for Healthcare Providers: https://www.young.biz/ This test is not yet approved or cleared by the [place] FDA and  has been authorized for detection and/or diagnosis of SARS-CoV-2 by  FDA under an Emergency Use Authorization (EUA). This EUA will remain  in effect (meaning this test can be used) for the duration of the  Covid-19 declaration under Section 564(b)(1) of the Act, 21  U.S.C. section 360bbb-3(b)(1), unless the authorization is  terminated or revoked. Performed at [facility], [address], [place], [place] [address]          Radiology Studies: CT CHEST WO CONTRAST  Result Date: 05/20/2019 CLINICAL DATA:  Short of breath and cough. Recent COVID-19 exposure, negative test. 59 y.o. male with chronic heart failure, valvular disease with bioprosthetic aortic valve, mitral valve repair in 2000,  hypertension, COPD, bipolar, hepatitis C with cirrhosis and polysubstance abuse who comes in for abnormal lab. Pt states he came here today for abnormal blood work. His potassium was low. Patient states that he just not been feeling well for the past few days. EXAM: CT CHEST WITHOUT CONTRAST TECHNIQUE: Multidetector CT imaging of the chest was performed following the standard protocol without IV contrast. COMPARISON:  08/07/2011 FINDINGS: Cardiovascular: Stable changes from previous cardiac surgery and mitral and aortic valve replacements. Heart is mildly enlarged. No pericardial effusion. Left coronary artery calcifications. Aorta is normal in caliber. There atherosclerotic calcifications. Main pulmonary artery dilated to 4.2 cm. Mediastinum/Nodes: No mediastinal or hilar masses. No enlarged lymph nodes. Trachea and esophagus are unremarkable. Lungs/Pleura: Small right pleural effusion. Fluid extends along the inferior right oblique fissure. No left pleural effusion. Dependent opacity in the right lower lobe is consistent with atelectasis. Azygos lobe with paraseptal emphysematous change noted in the right upper lobe anteriorly and medially. No lung mass or suspicious nodule. No convincing pneumonia and no pulmonary edema. No pneumothorax. Upper Abdomen: Small amount of ascites adjacent to the liver. No acute findings. Musculoskeletal: No fracture or acute finding. No osteoblastic or osteolytic lesions. IMPRESSION: 1. Right pleural effusion associated with dependent lower lobe atelectasis. 2. No evidence of pneumonia.  No pulmonary edema. 3. Mild right upper lobe paraseptal emphysema. 4. Mild enlargement the heart with changes from previous cardiac surgery and aortic and mitral valve replacements. Aortic atherosclerosis. Mild left coronary artery atherosclerosis. 5. Small amount of ascites in the visualized upper abdomen. Aortic Atherosclerosis (ICD10-I70.0) and Emphysema (ICD10-J43.9). Electronically Signed   By:  Amie Portland M.D.   On: 05/20/2019 12:38   DG Chest Portable 1 View  Result Date: 06/01/2019 CLINICAL DATA:  Shortness of breath. Pt to the er sent by his MD for abnormal labs. Pt says his potassium is low. EXAM: PORTABLE CHEST 1 VIEW COMPARISON:  Chest radiograph 01/30/2019 FINDINGS: Stable cardiomediastinal contours with enlarged heart size status post median sternotomy. Mild diffuse bilateral interstitial opacities. There are increased heterogeneous opacities at the right lung base with associated small right pleural effusion. There are mild linear opacities at the left lung base likely atelectasis or scarring. No pneumothorax. No acute finding in the visualized skeleton. IMPRESSION: Mild edema. Increased heterogeneous opacities at the right lung base, possibly representing atelectasis or infection, with associated small right pleural effusion. Mild  opacities at the left base likely atelectasis. Electronically Signed   By: Emmaline Kluver M.D.   On: 05/21/2019 20:29        Scheduled Meds: . azithromycin  500 mg Oral Daily  . folic acid  1 mg Oral Daily  . furosemide  40 mg Intravenous BID  . magnesium oxide  400 mg Oral BID  . multivitamin with minerals  1 tablet Oral Daily  . pantoprazole  40 mg Oral Daily  . potassium chloride  40 mEq Oral BID  . thiamine  100 mg Oral Daily   Continuous Infusions: . cefTRIAXone (ROCEPHIN)  IV       LOS: 1 day    Time spent: 35 minutes    Alberteen Sam, MD [facility] 05/20/2019, 12:55 PM     Please page though AMION or Epic secure chat:  For [employer], Higher education careers adviser

## 2019-05-20 NOTE — Progress Notes (Addendum)
   Patient HR ST since admit to floor, pulse 113-114. Pt. Denies CP but endorsing SOB with exertion. K = 2.8 - plan for IV replacement. Will CTM.    2550 - Patient remains ST, with BP now 98/70. Provider notified. Patient with no adverse symptoms. Plan to recheck VS after IV infusion complete.

## 2019-05-20 NOTE — Telephone Encounter (Signed)
-----   Message from Yvonne Kendall, MD sent at 05/20/2019  7:01 AM EST ----- Significant lab abnormalities noted, including marked hypokalemia, worsening kidney function, and continued liver abnormalities.  BNP also very high.  He was advised by the on-call APP yesterday to go to the ED, though I do not see that he ever went.  I recommend that he reconsider going to the ED.  If he continues to refuse, I suggest that we have him take potassium chloride 40 mEq every 4 hours x 2 doses, followed by 40 mEq daily.  He should increase torsemide to 60 mg BID and will need repeat BMP tomorrow.  Of note, lab comment is incorrect, as I was never contacted regarding critical result.  Result was called to [facility] [place], as document in his note.

## 2019-05-20 NOTE — Telephone Encounter (Signed)
Patient currently admitted at [facility]. 

## 2019-05-20 NOTE — Consult Note (Signed)
Cardiology Consultation:   Patient ID: [name]; [id number]; 11-Mar-1961   Admit date: 05/13/2019 Date of Consult: 05/20/2019  Primary Care Provider: Medicine, [place] Primary Cardiologist: End   Patient Profile:   [name] is a 59 y.o. 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 Loleta Books.  History of Present Illness:   [name] was previously followed by [facility] cardiology though has recently transition to our group in late 04/2019 in an effort to try and improve medical adherence with follow-up.  Notes indicate his prior [facility] cardiologist felt the patient was not a good candidate for redo valvular heart repair secondary to compliance issues and polysubstance abuse.  He has been seen in the ED and admitted to the hospital several times in the fall of last year secondary to volume overload which was a secondary to acute liver failure and AKI.  During that time he was consulted on by [facility] cardiology.  Echo in 01/2019 showed an EF of 50 to 55% with moderate biventricular enlargement, moderately reduced RV systolic function, moderate mitral and severe tricuspid regurgitation, severely elevated PASP.  Repeat echo later in 01/2019 showed a decline in his LV systolic function with an EF of 40 to 45%, moderate to severe mitral regurgitation, and moderately elevated PASP.  Upon establishing with our group P noted chronic dyspnea with an intermittent cough as well as intermittent hemoptysis.  He was seen in the office by Dr. Saunders Revel on 2/10 for follow up of SOB and weakness that had been going on for several weeks. He reported coughing up a 1/2 cup of blood several  days prior. He continued to drink 1-2 beers per day. His weight was down 4 kg when compared to his prior weight in late 04/2019. He was advised to increase his torsemide to 60 mg bid for 3 days then resume 60 mg daily thereafter. He was also advised to started Toprol XL 12.5 mg, half tab daily. Labs checked at the time of his office visit showed a potassium of 2.7 as well as elevated renal and liver function consistent with vascular congestion and hepatic congestion. BNP 1566, consistent with his prior readings. In this setting, he was advised to go to the ED where he was admitted with acute on chronic alcoholic hepatitis and electrolyte abnormalities. Initial vitals in the ED showed a BP of 160/62 with subsequent readings in the low 237S to 28B systolic, HR 151 bpm, O2 saturation 98% on RA with a RR of 19. Labs in the ED showed an initial HS-Tn of 49 with a delta of 34, PCT 0.39, ammonia 25, lactic acid 3.0, COVID-19 negative, potassium 2.8-->3.4, BUN 39-->41, SCr 1.74-->1.84, albumin 2.4, AST 52-->48, ALT 27-->27, alk phos 156-->153, T bili 8.9-->8.2 (inmproved from 10.9), magnesium 1.6, WBC 6.9, HGB 10.7-->10.7, PLT 85-->82. Since his admission, he has received IV albumin, azithromycin, Rocephin, IV Lasix 40 mg, IV magnesium as well as PO magnesium, IV and PO potassium 500 mL normal saline fluid bolus, as well as folate and thiamine.  CT chest showed a right pleural effusion with dependent lower lobe atelectasis, no evidence of pneumonia or pulmonary edema, emphysema was noted, mild cardiomegaly with changes  consistent with prior aortic and mitral valve replacements as well as aortic atherosclerosis and mild left coronary artery calcification and a small amount of ascites visualized in the upper abdomen.  Following administration of IV Lasix 40 mg in the ED he does note some slight improvement in his dyspnea this afternoon.  His heart rates remain consistently elevated in the 1 teens bpm.    Past Medical  History:  Diagnosis Date   Alcohol abuse    Bipolar 1 disorder (HCC)    CHF (congestive heart failure) (HCC)    COPD (chronic obstructive pulmonary disease) (HCC)    Hip pain    Hypertension    Pulmonary HTN (HCC)    Valvular heart disease     Past Surgical History:  Procedure Laterality Date   AORTIC VALVE REPLACEMENT       Home Meds: Prior to Admission medications   Medication Sig Start Date End Date Taking? Authorizing Provider  acetaminophen (TYLENOL) 500 MG tablet Take 1,000 mg by mouth every 8 (eight) hours. 10/30/18 10/30/19 Yes [provider]  albuterol (VENTOLIN HFA) 108 (90 Base) MCG/ACT inhaler Inhale 2 puffs into the lungs every 4 (four) hours as needed for wheezing or shortness of breath. 01/21/19  Yes Duffy Bruce, MD  aspirin EC 81 MG tablet Take 81 mg by mouth daily.   Yes [provider]  atorvastatin (LIPITOR) 40 MG tablet Take 40 mg by mouth daily. 10/30/18 10/30/19 Yes [provider]  DULoxetine (CYMBALTA) 30 MG capsule Take 30 mg by mouth daily. 09/08/18  Yes [provider]  FLUoxetine (PROZAC) 20 MG capsule Take 20 mg by mouth every morning. 08/26/18  Yes [provider]  gabapentin (NEURONTIN) 300 MG capsule Take 1 capsule (300 mg total) by mouth 3 (three) times daily. 02/15/19 02/15/20 Yes Hackney, [name] A, FNP  ipratropium (ATROVENT HFA) 17 MCG/ACT inhaler Inhale 2 puffs into the lungs 4 (four) times daily. 02/15/19 02/15/20 Yes Hackney, Tina A, FNP  meloxicam (MOBIC) 15 MG tablet Take 15 mg by mouth daily.   Yes [provider]  mirtazapine (REMERON) 15 MG tablet Take 15 mg by mouth at bedtime. 09/08/18 09/08/19 Yes [provider]  naproxen (NAPROSYN) 500 MG tablet Take 500 mg by mouth 2 (two) times daily with a meal.   Yes [provider]  potassium chloride 20 MEQ TBCR Take 20 mEq by mouth daily. 01/28/19  Yes Hackney, [name] A, FNP  torsemide (DEMADEX) 20 MG tablet Take 3 tablets (60 mg  total) by mouth daily. 02/15/19 05/31/2019 Yes Darylene Price A, FNP  metoprolol succinate (TOPROL-XL) 25 MG 24 hr tablet Take 0.5 tablets (12.5 mg total) by mouth daily. 05/05/19   End, Harrell Gave, MD    Inpatient Medications: Scheduled Meds:  azithromycin  500 mg Oral Daily   folic acid  1 mg Oral Daily   furosemide  40 mg Intravenous BID   magnesium oxide  400 mg Oral BID   multivitamin with minerals  1 tablet Oral Daily   pantoprazole  40 mg Oral Daily   potassium chloride  40 mEq Oral BID   thiamine  100 mg Oral Daily   Or   thiamine  100 mg Intravenous Daily   Continuous Infusions:  cefTRIAXone (ROCEPHIN)  IV     PRN Meds: LORazepam **OR** LORazepam, ondansetron **OR** ondansetron (ZOFRAN) IV  Allergies:  No Known Allergies  Social History:   Social History   Socioeconomic History   Marital status: Single    Spouse  name: Not on file   Number of children: Not on file   Years of education: Not on file   Highest education level: Not on file  Occupational History   Occupation: disabled  Tobacco Use   Smoking status: Current Every Day Smoker    Packs/day: 0.25    Types: Cigarettes   Smokeless tobacco: Never Used  Substance and Sexual Activity   Alcohol use: Yes    Alcohol/week: 2.0 standard drinks    Types: 2 Cans of beer per week    Comment: drinks 40 ounce beers   Drug use: Yes    Types: Cocaine, Marijuana    Comment: last did last week   Sexual activity: Not Currently  Other Topics Concern   Not on file  Social History Narrative   Not on file   Social Determinants of Health   Financial Resource Strain: Low Risk    Difficulty of Paying Living Expenses: Not very hard  Food Insecurity: No Food Insecurity   Worried About Charity fundraiser in the Last Year: Never true   Ran Out of Food in the Last Year: Never true  Transportation Needs: No Transportation Needs   Lack of Transportation (Medical): No   Lack of Transportation  (Non-Medical): No  Physical Activity: Unknown   Days of Exercise per Week: 2 days   Minutes of Exercise per Session: Not on file  Stress: Stress Concern Present   Feeling of Stress : To some extent  Social Connections: Somewhat Isolated   Frequency of Communication with Friends and Family: More than three times a week   Frequency of Social Gatherings with Friends and Family: More than three times a week   Attends Religious Services: 1 to 4 times per year   Active Member of [employer] or Organizations: No   Attends Music therapist: Never   Marital Status: Never married  Human resources officer Violence: Not At Risk   Fear of Current or Ex-Partner: No   Emotionally Abused: No   Physically Abused: No   Sexually Abused: No     Family History:   Family History  Problem Relation Age of Onset   Heart failure Father    Hypertension Father     ROS:  Review of Systems  Constitutional: Positive for malaise/fatigue. Negative for chills, diaphoresis, fever and weight loss.  HENT: Negative for congestion.   Eyes: Negative for discharge and redness.  Respiratory: Positive for hemoptysis and shortness of breath. Negative for cough, sputum production and wheezing.   Cardiovascular: Positive for leg swelling. Negative for chest pain, palpitations, orthopnea, claudication and PND.  Gastrointestinal: Negative for abdominal pain, blood in stool, heartburn, melena, nausea and vomiting.  Genitourinary: Negative for hematuria.  Musculoskeletal: Positive for joint pain. Negative for falls and myalgias.       Right hip and knee pain  Skin: Negative for rash.  Neurological: Positive for weakness. Negative for dizziness, tingling, tremors, sensory change, speech change, focal weakness and loss of consciousness.  Endo/Heme/Allergies: Does not bruise/bleed easily.  Psychiatric/Behavioral: Negative for substance abuse. The patient is not nervous/anxious.   All other systems reviewed and  are negative.     Physical Exam/Data:   Vitals:   05/20/19 0156 05/20/19 0359 05/20/19 0538 05/20/19 0814  BP: 1'01/65 98/70 92/63 ' 106/67  Pulse: (!) 113 (!) 114 (!) 114 (!) 113  Resp:  20  18  Temp:  (!) 97.4 F (36.3 C) 97.8 F (36.6 C) 98.2 F (36.8 C)  TempSrc:  Oral  Oral  SpO2: 100% 95% 100% 99%  Weight:      Height:        Intake/Output Summary (Last 24 hours) at 05/20/2019 0918 Last data filed at 05/20/2019 0908 Gross per 24 hour  Intake 336.07 ml  Output 250 ml  Net 86.07 ml   Filed Weights   06/01/2019 1944  Weight: 66.7 kg   Body mass index is 22.35 kg/m.   Physical Exam: General: Well developed, well nourished, in no acute distress. Head: Normocephalic, atraumatic, sclera non-icteric, no xanthomas, nares without discharge.  Neck: Negative for carotid bruits. JVD not elevated. Lungs: Coarse and diminished breath sounds bilaterally, especially on the right base. Breathing is unlabored. Heart: Tachycardic with S1 S2. II/VI systolic and diastolic murmurs, no rubs, or gallops appreciated. Abdomen: Soft, non-tender, non-distended with normoactive bowel sounds. No hepatomegaly. No rebound/guarding. No obvious abdominal masses. Msk:  Strength and tone appear normal for age. Extremities: No clubbing or cyanosis. No edema. Distal pedal pulses are 2+ and equal bilaterally. Neuro: Alert and oriented X 3. No facial asymmetry. No focal deficit. Moves all extremities spontaneously. Psych:  Responds to questions appropriately with a normal affect.   EKG:  The EKG was personally reviewed and demonstrates: Atrial flutter with RVR, 114 bpm, right axis deviation, no acute ST-T changes Telemetry:  Telemetry was personally reviewed and demonstrates: Atrial flutter with RVR with ventricular rates in the 1 teens bpm  Weights: Filed Weights   06/03/2019 1944  Weight: 66.7 kg    Relevant CV Studies: 2D echo 01/2019: 1. Left ventricular ejection fraction, by visual estimation, is  40 to 45%. The left ventricle has mildly decreased function. Left ventricular septal wall thickness was normal. Normal left ventricular posterior wall thickness. There is no left  ventricular hypertrophy.  2. Abnormal septal motion consistent with left bundle branch block.  3. Idiopathic cardiomyopathy.  4. Moderately dilated left ventricular internal cavity size.  5. Global right ventricle has moderately reduced systolic function.The right ventricular size is moderately enlarged. No increase in right ventricular wall thickness.  6. Left atrial size was moderately dilated.  7. Right atrial size was moderately dilated.  8. The mitral valve is degenerative. Moderate to severe mitral valve regurgitation. Mild mitral stenosis.  9. The tricuspid valve is grossly normal. Tricuspid valve regurgitation moderate-severe.  10. The aortic valve is grossly normal. Aortic valve regurgitation is mild. Mild to moderate aortic valve sclerosis/calcification without any evidence of aortic stenosis.  11. The pulmonic valve was grossly normal. Pulmonic valve regurgitation is mild.  12. Moderately elevated pulmonary artery systolic pressure.  13. Mild ant Akinesis.  14. IVCD.   Laboratory Data:  Chemistry Recent Labs  Lab 05/26/2019 2018 05/30/2019 2329 05/20/19 0521  NA 137 136 136  K 2.7* 2.8* 3.4*  CL 90* 94* 95*  CO2 33* 28 28  GLUCOSE 223* 178* 106*  BUN 39* 39* 41*  CREATININE 1.79* 1.74* 1.84*  CALCIUM 8.4* 8.0* 8.1*  GFRNONAA 41* 42* 39*  GFRAA 47* 49* 45*  ANIONGAP '14 14 13    ' Recent Labs  Lab 06/01/2019 2018 05/23/2019 2329 05/20/19 0521  PROT 8.2* 7.2 7.3  ALBUMIN 2.8* 2.4* 2.5*  AST 60* 52* 48*  ALT 33 27 27  ALKPHOS 186* 156* 153*  BILITOT 10.9* 8.9* 8.2*   Hematology Recent Labs  Lab 06/03/2019 2018 05/23/2019 2329 05/20/19 0521  WBC 6.7 6.9 8.3  RBC 5.53 4.99 4.99  HGB 11.8* 10.7* 10.7*  HCT 37.9* 33.5* 33.9*  MCV 68.5* 67.1* 67.9*  MCH 21.3* 21.4* 21.4*  MCHC 31.1  31.9 31.6  RDW 24.1* 24.7* 24.6*  PLT 91* 85* 82*   Cardiac EnzymesNo results for input(s): TROPONINI in the last 168 hours. No results for input(s): TROPIPOC in the last 168 hours.  BNP Recent Labs  Lab 05/20/2019 1716  BNP 1,566.0*    DDimer No results for input(s): DDIMER in the last 168 hours.  Radiology/Studies:  DG Chest Portable 1 View  Result Date: 05/18/2019 IMPRESSION: Mild edema. Increased heterogeneous opacities at the right lung base, possibly representing atelectasis or infection, with associated small right pleural effusion. Mild opacities at the left base likely atelectasis. Electronically Signed   By: Audie Pinto M.D.   On: 05/29/2019 20:29    Assessment and Plan:   1.  New onset atrial flutter with RVR: -Overall, this is a very difficult situation -His tachycardic rates have likely precipitated some degree of his dyspnea and volume overload though I also suspect some of his volume is related to third spacing secondary to hypoalbuminemia -He has not a good anticoagulation candidate secondary to recently reported hemoptysis/hematemesis, thrombocytopenia with downtrending platelet count, and elevated INR in the setting of underlying liver disease with ongoing alcohol use -Not a good candidate for digoxin secondary to AKI and hypokalemic state -TEE guided cardioversion is not ideal given this would commit him to anticoagulation for a minimum of 4 weeks afterwards in the setting of the above significant comorbid conditions with high likelihood of recurrent arrhythmia secondary to ongoing alcohol abuse with malnutrition and likely recurrent electrolyte derangements -In this setting, we will pursue rate control with trial of low-dose metoprolol tartrate 12.5 mg twice daily  2.  Acute on chronic combined systolic and diastolic CHF, NYHA class IV: -Likely exacerbated by new onset atrial flutter with RVR with some degree of third spacing in the setting of  hypoalbuminemia -Decrease IV Lasix to 40 mg daily for now with close monitoring of ins and outs, weights, and renal function -Add low-dose metoprolol as outlined above -Not currently a candidate for further GDMT including ACE inhibitor/ARB as well as spironolactone secondary to relative hypotension and AKI -Remains on atorvastatin  3.  Valvular heart disease: -He has previously been felt to be a poor candidate for redo valvuloplasty in the setting of medication nonadherence and polysubstance abuse -This can be followed up by his primary cardiologist in the outpatient setting  4.  Alcoholic/hepatitis C cirrhosis: -Patient has ongoing alcohol use with cessation being recommended -Urine drug screen pending -As patient's renal function improves we may need to consider addition of spironolactone -His hypoalbuminemia is likely contributing to his third spacing  5.  AKI: -Baseline approximately 0.9-1.2 with most recent serum creatinine of 1.8 -Decrease IV Lasix as outlined above -Continue to monitor  6.  Anemia/thrombocytopenia: -Stable -Continue to monitor with ASA    Consider palliative care consult for goals of care discussion given multiple organ failure including liver, kidney, heart, and hematologic.   For questions or updates, please contact [place] Please consult [email or web address] for contact info under Cardiology/STEMI.   Signed, Christell Faith, PA-C [place] Pager: [phone] 05/20/2019, 9:18 AM

## 2019-05-21 ENCOUNTER — Inpatient Hospital Stay: Payer: Medicaid Other

## 2019-05-21 ENCOUNTER — Telehealth: Payer: Self-pay | Admitting: Internal Medicine

## 2019-05-21 ENCOUNTER — Encounter: Payer: Self-pay | Admitting: Internal Medicine

## 2019-05-21 DIAGNOSIS — Z7189 Other specified counseling: Secondary | ICD-10-CM

## 2019-05-21 DIAGNOSIS — Z515 Encounter for palliative care: Secondary | ICD-10-CM

## 2019-05-21 DIAGNOSIS — J432 Centrilobular emphysema: Secondary | ICD-10-CM

## 2019-05-21 DIAGNOSIS — F319 Bipolar disorder, unspecified: Secondary | ICD-10-CM

## 2019-05-21 DIAGNOSIS — K7200 Acute and subacute hepatic failure without coma: Secondary | ICD-10-CM

## 2019-05-21 DIAGNOSIS — I272 Pulmonary hypertension, unspecified: Secondary | ICD-10-CM

## 2019-05-21 DIAGNOSIS — F1410 Cocaine abuse, uncomplicated: Secondary | ICD-10-CM

## 2019-05-21 LAB — CBC
HCT: 37.5 % — ABNORMAL LOW (ref 39.0–52.0)
Hemoglobin: 11.9 g/dL — ABNORMAL LOW (ref 13.0–17.0)
MCH: 21.5 pg — ABNORMAL LOW (ref 26.0–34.0)
MCHC: 31.7 g/dL (ref 30.0–36.0)
MCV: 67.8 fL — ABNORMAL LOW (ref 80.0–100.0)
Platelets: 97 10*3/uL — ABNORMAL LOW (ref 150–400)
RBC: 5.53 MIL/uL (ref 4.22–5.81)
RDW: 25.9 % — ABNORMAL HIGH (ref 11.5–15.5)
WBC: 11.1 10*3/uL — ABNORMAL HIGH (ref 4.0–10.5)
nRBC: 0.5 % — ABNORMAL HIGH (ref 0.0–0.2)

## 2019-05-21 LAB — MAGNESIUM: Magnesium: 2.2 mg/dL (ref 1.7–2.4)

## 2019-05-21 LAB — PROTIME-INR
INR: 2.5 — ABNORMAL HIGH (ref 0.8–1.2)
Prothrombin Time: 26.5 seconds — ABNORMAL HIGH (ref 11.4–15.2)

## 2019-05-21 LAB — GLUCOSE, CAPILLARY
Glucose-Capillary: 61 mg/dL — ABNORMAL LOW (ref 70–99)
Glucose-Capillary: 62 mg/dL — ABNORMAL LOW (ref 70–99)
Glucose-Capillary: 73 mg/dL (ref 70–99)
Glucose-Capillary: 87 mg/dL (ref 70–99)

## 2019-05-21 LAB — URINE DRUG SCREEN, QUALITATIVE (ARMC ONLY)
Amphetamines, Ur Screen: NOT DETECTED
Barbiturates, Ur Screen: NOT DETECTED
Benzodiazepine, Ur Scrn: NOT DETECTED
Cannabinoid 50 Ng, Ur ~~LOC~~: NOT DETECTED
Cocaine Metabolite,Ur ~~LOC~~: POSITIVE — AB
MDMA (Ecstasy)Ur Screen: NOT DETECTED
Methadone Scn, Ur: NOT DETECTED
Opiate, Ur Screen: NOT DETECTED
Phencyclidine (PCP) Ur S: NOT DETECTED
Tricyclic, Ur Screen: NOT DETECTED

## 2019-05-21 LAB — BASIC METABOLIC PANEL
Anion gap: 19 — ABNORMAL HIGH (ref 5–15)
BUN: 54 mg/dL — ABNORMAL HIGH (ref 6–20)
CO2: 23 mmol/L (ref 22–32)
Calcium: 9 mg/dL (ref 8.9–10.3)
Chloride: 95 mmol/L — ABNORMAL LOW (ref 98–111)
Creatinine, Ser: 2.43 mg/dL — ABNORMAL HIGH (ref 0.61–1.24)
GFR calc Af Amer: 32 mL/min — ABNORMAL LOW (ref >60)
GFR calc non Af Amer: 28 mL/min — ABNORMAL LOW (ref >60)
Glucose, Bld: 46 mg/dL — ABNORMAL LOW (ref 70–99)
Potassium: 4.8 mmol/L (ref 3.5–5.1)
Sodium: 137 mmol/L (ref 135–145)

## 2019-05-21 LAB — HEPATIC FUNCTION PANEL
ALT: 31 U/L (ref 0–44)
AST: 65 U/L — ABNORMAL HIGH (ref 15–41)
Albumin: 2.6 g/dL — ABNORMAL LOW (ref 3.5–5.0)
Alkaline Phosphatase: 165 U/L — ABNORMAL HIGH (ref 38–126)
Bilirubin, Direct: 5.9 mg/dL — ABNORMAL HIGH (ref 0.0–0.2)
Indirect Bilirubin: 4.5 mg/dL — ABNORMAL HIGH (ref 0.3–0.9)
Total Bilirubin: 10.4 mg/dL — ABNORMAL HIGH (ref 0.3–1.2)
Total Protein: 7.7 g/dL (ref 6.5–8.1)

## 2019-05-21 MED ORDER — ALBUTEROL SULFATE (2.5 MG/3ML) 0.083% IN NEBU: 2.5000 mg | INHALATION_SOLUTION | RESPIRATORY_TRACT | Status: DC | PRN

## 2019-05-21 MED ORDER — IOHEXOL 9 MG/ML PO SOLN
500.0000 mL | ORAL | Status: AC
  Administered 2019-05-21 (×2): 500 mL via ORAL

## 2019-05-21 MED ORDER — PREDNISONE 20 MG PO TABS
40.0000 mg | ORAL_TABLET | Freq: Every day | ORAL | Status: AC
  Administered 2019-05-22 – 2019-05-24 (×3): 40 mg via ORAL
  Filled 2019-05-21 (×3): qty 2

## 2019-05-21 MED ORDER — METOPROLOL TARTRATE 25 MG PO TABS
12.5000 mg | ORAL_TABLET | Freq: Two times a day (BID) | ORAL | Status: DC
  Filled 2019-05-21 (×2): qty 1

## 2019-05-21 MED ORDER — IPRATROPIUM-ALBUTEROL 0.5-2.5 (3) MG/3ML IN SOLN
3.0000 mL | Freq: Three times a day (TID) | RESPIRATORY_TRACT | Status: DC
  Administered 2019-05-21 – 2019-05-26 (×14): 3 mL via RESPIRATORY_TRACT
  Filled 2019-05-21 (×16): qty 3

## 2019-05-21 MED ORDER — ALBUMIN HUMAN 25 % IV SOLN
12.5000 g | Freq: Once | INTRAVENOUS | Status: AC
  Administered 2019-05-21: 12.5 g via INTRAVENOUS
  Filled 2019-05-21: qty 50

## 2019-05-21 NOTE — Progress Notes (Signed)
Nutrition Brief Note  RD received consult for assessment of nutrition requirements/status. Patient was just seen by RD for initial assessment yesterday on 2/11. Please see note from 2/11 for nutrition assessment and intervention. RD will continue to follow patient during admission.  Felix Pacini, MS, RD, LDN Pager number available on Amion

## 2019-05-21 NOTE — Telephone Encounter (Signed)
I do not see information on this referral. No answer. Left message to call back at number I was forwarded to, [phone].

## 2019-05-21 NOTE — Telephone Encounter (Signed)
[facility] is calling in regarding a referral that has been placed for this location. The caller, Weyman Croon, is unsure that their location is the most appropriate for the patient and is wondering if the referral is correct or needs to go somewhere else.  Please advise by calling [phone]

## 2019-05-21 NOTE — Consult Note (Signed)
Consultation Note Date: 05/21/2019   Patient Name: [name]  DOB: Nov 05, 1960  MRN: [id number]  Age / Sex: 59 y.o., male  PCP: [facility], [place] Referring Physician: Edwin Dada, *  Reason for Consultation: Establishing goals of care and Psychosocial/spiritual support  HPI/Patient Profile: 59 y.o. 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   Clinical Assessment and Goals of Care: [name] is lying quietly in bed.  He is oriented to person and place at this time.  He will briefly make but not keep eye contact.  He appears acutely/chronically ill and somewhat frail.   [name] was seen by PMT in October 2020, and was able to have a detailed discussion related to his multiorgan involvement.  We talked about healthcare power of attorney, see below. We also talked about CODE STATUS, see below.  Limited discussions today due to [name] mental status, frailty.   He declines for me to reach out to family at this time. PMT to continue to follow.   HCPOA    NEXT OF KIN -surrogate decision-maker by default his mother, [name], unfortunately she is 60 years old.  [name] tells me that he also has brothers and sisters who would help with decision-making.  He has adult children, but does not share their names.   SUMMARY OF RECOMMENDATIONS   At this point full scope/full code Did set 2-week time limit for life support  Ultimately, goal is to return home Would rehospitalize as needed  Code Status/Advance Care Planning:  Full code -we talked about "life support".  Although [name] once  attempted resuscitation and ventilation, he did set a limit of time, 2 weeks.   Symptom Management:   Per hospitalist, no additional needs at this time.  Palliative Prophylaxis:   Frequent Pain Assessment and Turn Reposition  Additional Recommendations (Limitations, Scope, Preferences):  Full Scope Treatment  Psycho-social/Spiritual:   Desire for further Chaplaincy support:no  Additional Recommendations: Caregiving  Support/Resources  Prognosis:   Unable to determine, based on outcomes.  6 months or less would not be surprising based on worsening chronic health conditions, 3 hospitalizations and 2 ED visits in the last 6 months, albumin in the mid 2's range  Discharge Planning: to be determined, based on outcomes. Medicaid, so likely no rehab.       Primary Diagnoses: Present on Admission: . Pulmonary hypertension ([place]) . COPD (chronic obstructive pulmonary disease) ([place]) . Bipolar 1 disorder ([place]) . Hypokalemia . Hypomagnesemia . Alcoholic hepatitis with ascites . AKI (acute kidney injury) ([place]) . Hyperglycemia   I have reviewed the medical record, interviewed the patient and family, and examined the patient. The following aspects are pertinent.  Past Medical History:  Diagnosis Date  . Alcohol  abuse   . Bipolar 1 disorder ([facility])   . CHF (congestive heart failure) ([facility])   . COPD (chronic obstructive pulmonary disease) ([facility])   . Hip pain   . Hypertension   . Pulmonary HTN ([facility])   . Valvular heart disease    Social History   Socioeconomic History  . Marital status: Single    Spouse name: Not on file  . Number of children: Not on file  . Years of education: Not on file  . Highest education level: Not on file  Occupational History  . Occupation: disabled  Tobacco Use  . Smoking status: Current Every Day Smoker    Packs/day: 0.25    Types: Cigarettes  . Smokeless tobacco: Never Used  Substance and Sexual Activity  . Alcohol use: Yes    Alcohol/week:  2.0 standard drinks    Types: 2 Cans of beer per week    Comment: drinks 40 ounce beers  . Drug use: Yes    Types: Cocaine, Marijuana    Comment: last did last week  . Sexual activity: Not Currently  Other Topics Concern  . Not on file  Social History Narrative  . Not on file   Social Determinants of Health   Financial Resource Strain: Low Risk   . Difficulty of Paying Living Expenses: Not very hard  Food Insecurity: No Food Insecurity  . Worried About Programme researcher, broadcasting/film/video in the Last Year: Never true  . Ran Out of Food in the Last Year: Never true  Transportation Needs: No Transportation Needs  . Lack of Transportation (Medical): No  . Lack of Transportation (Non-Medical): No  Physical Activity: Unknown  . Days of Exercise per Week: 2 days  . Minutes of Exercise per Session: Not on file  Stress: Stress Concern Present  . Feeling of Stress : To some extent  Social Connections: Somewhat Isolated  . Frequency of Communication with Friends and Family: More than three times a week  . Frequency of Social Gatherings with Friends and Family: More than three times a week  . Attends Religious Services: 1 to 4 times per year  . Active Member of Clubs or Organizations: No  . Attends Banker Meetings: Never  . Marital Status: Never married   Family History  Problem Relation Age of Onset  . Heart failure Father   . Hypertension Father    Scheduled Meds: . aspirin EC  81 mg Oral Daily  . atorvastatin  40 mg Oral q1800  . azithromycin  500 mg Oral Daily  . feeding supplement (ENSURE ENLIVE)  237 mL Oral TID BM  . FLUoxetine  20 mg Oral Daily  . folic acid  1 mg Oral Daily  . ipratropium-albuterol  3 mL Nebulization TID  . magnesium oxide  400 mg Oral BID  . metoprolol tartrate  12.5 mg Oral BID  . mirtazapine  15 mg Oral QHS  . multivitamin with minerals  1 tablet Oral Daily  . pantoprazole  40 mg Oral Daily  . thiamine  100 mg Oral Daily   Continuous Infusions: .  cefTRIAXone (ROCEPHIN)  IV 1 g (05/20/19 2034)   PRN Meds:.albuterol, LORazepam **OR** LORazepam, ondansetron **OR** ondansetron (ZOFRAN) IV Medications Prior to Admission:  Prior to Admission medications   Medication Sig Start Date End Date Taking? Authorizing Provider  acetaminophen (TYLENOL) 500 MG tablet Take 1,000 mg by mouth every 8 (eight) hours. 10/30/18 10/30/19 Yes [provider]  albuterol (VENTOLIN HFA) 108 (90 Base)  MCG/ACT inhaler Inhale 2 puffs into the lungs every 4 (four) hours as needed for wheezing or shortness of breath. 01/21/19  Yes Shaune Pollack, MD  aspirin EC 81 MG tablet Take 81 mg by mouth daily.   Yes [provider]  atorvastatin (LIPITOR) 40 MG tablet Take 40 mg by mouth daily. 10/30/18 10/30/19 Yes [provider]  DULoxetine (CYMBALTA) 30 MG capsule Take 30 mg by mouth daily. 09/08/18  Yes [provider]  FLUoxetine (PROZAC) 20 MG capsule Take 20 mg by mouth every morning. 08/26/18  Yes [provider]  gabapentin (NEURONTIN) 300 MG capsule Take 1 capsule (300 mg total) by mouth 3 (three) times daily. 02/15/19 02/15/20 Yes Hackney, Inetta Fermo A, FNP  ipratropium (ATROVENT HFA) 17 MCG/ACT inhaler Inhale 2 puffs into the lungs 4 (four) times daily. 02/15/19 02/15/20 Yes Hackney, Tina A, FNP  meloxicam (MOBIC) 15 MG tablet Take 15 mg by mouth daily.   Yes [provider]  mirtazapine (REMERON) 15 MG tablet Take 15 mg by mouth at bedtime. 09/08/18 09/08/19 Yes [provider]  naproxen (NAPROSYN) 500 MG tablet Take 500 mg by mouth 2 (two) times daily with a meal.   Yes [provider]  potassium chloride 20 MEQ TBCR Take 20 mEq by mouth daily. 01/28/19  Yes Hackney, Inetta Fermo A, FNP  torsemide (DEMADEX) 20 MG tablet Take 3 tablets (60 mg total) by mouth daily. 02/15/19 05/18/2019 Yes Clarisa Kindred A, FNP  metoprolol succinate (TOPROL-XL) 25 MG 24 hr tablet Take 0.5 tablets (12.5 mg total) by mouth daily. 05/05/19   End,  Cristal Deer, MD   No Known Allergies Review of Systems  Unable to perform ROS: Mental status change    Physical Exam Vitals and nursing note reviewed.  Constitutional:      General: He is not in acute distress.    Appearance: He is ill-appearing.  Cardiovascular:     Rate and Rhythm: Normal rate.  Pulmonary:     Effort: Pulmonary effort is normal. No respiratory distress.  Abdominal:     General: There is distension.     Tenderness: There is no guarding.  Skin:    General: Skin is warm and dry.  Neurological:     Mental Status: He is alert.     Comments: Oriented to person and place at this time     Vital Signs: BP 102/67 (BP Location: Right Arm)   Pulse 96   Temp 98 F (36.7 C)   Resp 18   Ht 5\' 8"  (1.727 m)   Wt 66.7 kg   SpO2 99%   BMI 22.35 kg/m  Pain Scale: 0-10   Pain Score: 0-No pain   SpO2: SpO2: 99 % O2 Device:SpO2: 99 % O2 Flow Rate: .   IO: Intake/output summary:   Intake/Output Summary (Last 24 hours) at 05/21/2019 1442 Last data filed at 05/21/2019 1000 Gross per 24 hour  Intake 0 ml  Output 25 ml  Net -25 ml    LBM: Last BM Date: 05/20/19 Baseline Weight: Weight: 66.7 kg Most recent weight: Weight: 66.7 kg     Palliative Assessment/Data:   Flowsheet Rows     Most Recent Value  Intake Tab  Referral Department  Hospitalist  Unit at Time of Referral  Cardiac/Telemetry Unit  Palliative Care Primary Diagnosis  Cardiac  Date Notified  05/21/19  Palliative Care Type  Return patient Palliative Care  Reason for referral  Clarify Goals of Care  Date of Admission  05/21/2019  Date first seen by Palliative Care  05/21/19  # of days Palliative referral response time  0 Day(s)  # of days IP prior to Palliative referral  2  Clinical Assessment  Palliative Performance Scale Score  40%  Pain Max last 24 hours  Not able to report  Pain Min Last 24 hours  Not able to report  Dyspnea Max Last 24 Hours  Not able to report  Dyspnea Min Last 24 hours   Not able to report  Psychosocial & Spiritual Assessment  Palliative Care Outcomes      Time In: 1430 Time Out: 1500 Time Total: 30 minutes  Greater than 50%  of this time was spent counseling and coordinating care related to the above assessment and plan.  Signed by: Katheran Awe, NP   Please contact Palliative Medicine Team phone at [phone] for questions and concerns.  For individual provider: See Loretha Stapler

## 2019-05-21 NOTE — Progress Notes (Addendum)
Progress Note  Patient Name: [name] Date of Encounter: 05/21/2019  Primary Cardiologist: End  Subjective   No chest pain. SOB about the same. States, " I could be worse." Renal function has bumped from BUN/SCr 41/1.84 to 54/2.43 leading his Lasix to be stopped. Metoprolol was held secondary to soft BP. He remains in atrial flutter with ventricular rates in the 90s to low 100s bpm. Albumin remains low at 2.6. LFT worse. UDS positive for cocaine this admission.   Inpatient Medications    Scheduled Meds: . aspirin EC  81 mg Oral Daily  . atorvastatin  40 mg Oral q1800  . azithromycin  500 mg Oral Daily  . feeding supplement (ENSURE ENLIVE)  237 mL Oral TID BM  . FLUoxetine  20 mg Oral Daily  . folic acid  1 mg Oral Daily  . ipratropium-albuterol  3 mL Nebulization TID  . magnesium oxide  400 mg Oral BID  . mirtazapine  15 mg Oral QHS  . multivitamin with minerals  1 tablet Oral Daily  . pantoprazole  40 mg Oral Daily  . thiamine  100 mg Oral Daily   Continuous Infusions: . albumin human    . cefTRIAXone (ROCEPHIN)  IV 1 g (05/20/19 2034)   PRN Meds: LORazepam **OR** LORazepam, ondansetron **OR** ondansetron (ZOFRAN) IV   Vital Signs    Vitals:   05/21/19 0507 05/21/19 0746 05/21/19 0833 05/21/19 0835  BP: (!) 87/61 91/67    Pulse: (!) 103 95 (!) 101 97  Resp: 19 19 18    Temp: 97.8 F (36.6 C) 98 F (36.7 C)    TempSrc: Oral     SpO2: 97% 100% 100%   Weight:      Height:        Intake/Output Summary (Last 24 hours) at 05/21/2019 0836 Last data filed at 05/20/2019 2150 Gross per 24 hour  Intake 240 ml  Output 225 ml  Net 15 ml   Filed Weights   05/22/2019 1944  Weight: 66.7 kg    Telemetry    Atrial flutter with ventricular rates in the 90s to low 100s bpm - Personally Reviewed  ECG    No new tracings - Personally Reviewed  Physical Exam   GEN: No acute distress.   Neck: JVD elevated ~ 10 cm. Cardiac: Irregular, III/VI systolic murmur LSB,  no rubs, or gallops.  Respiratory: Diminished breath sounds bilaterally.  GI: Soft, nontender, non-distended.   MS: Trace ankle edema; No deformity. Neuro:  Alert and oriented x 3; Nonfocal.  Psych: Normal affect.  Labs    Chemistry Recent Labs  Lab 05/18/2019 2329 05/20/19 0521 05/21/19 0631  NA 136 136 137  K 2.8* 3.4* 4.8  CL 94* 95* 95*  CO2 28 28 23   GLUCOSE 178* 106* 46*  BUN 39* 41* 54*  CREATININE 1.74* 1.84* 2.43*  CALCIUM 8.0* 8.1* 9.0  PROT 7.2 7.3 7.7  ALBUMIN 2.4* 2.5* 2.6*  AST 52* 48* 65*  ALT 27 27 31   ALKPHOS 156* 153* 165*  BILITOT 8.9* 8.2* 10.4*  GFRNONAA 42* 39* 28*  GFRAA 49* 45* 32*  ANIONGAP 14 13 19*     Hematology Recent Labs  Lab 05/22/2019 2329 05/20/19 0521 05/21/19 0631  WBC 6.9 8.3 11.1*  RBC 4.99 4.99 5.53  HGB 10.7* 10.7* 11.9*  HCT 33.5* 33.9* 37.5*  MCV 67.1* 67.9* 67.8*  MCH 21.4* 21.4* 21.5*  MCHC 31.9 31.6 31.7  RDW 24.7* 24.6* 25.9*  PLT 85*  82* 97*    Cardiac EnzymesNo results for input(s): TROPONINI in the last 168 hours. No results for input(s): TROPIPOC in the last 168 hours.   BNP Recent Labs  Lab 05/12/2019 1716  BNP 1,566.0*     DDimer No results for input(s): DDIMER in the last 168 hours.   Radiology    CT CHEST WO CONTRAST  Result Date: 05/20/2019 IMPRESSION: 1. Right pleural effusion associated with dependent lower lobe atelectasis. 2. No evidence of pneumonia.  No pulmonary edema. 3. Mild right upper lobe paraseptal emphysema. 4. Mild enlargement the heart with changes from previous cardiac surgery and aortic and mitral valve replacements. Aortic atherosclerosis. Mild left coronary artery atherosclerosis. 5. Small amount of ascites in the visualized upper abdomen. Aortic Atherosclerosis (ICD10-I70.0) and Emphysema (ICD10-J43.9). Electronically Signed   By: Amie Portland M.D.   On: 05/20/2019 12:38   DG Chest Portable 1 View  Result Date: 05/21/2019 IMPRESSION: Mild edema. Increased heterogeneous  opacities at the right lung base, possibly representing atelectasis or infection, with associated small right pleural effusion. Mild opacities at the left base likely atelectasis. Electronically Signed   By: Emmaline Kluver M.D.   On: 06/04/2019 20:29    Cardiac Studies   2D echo 01/2019: 1. Left ventricular ejection fraction, by visual estimation, is 40 to 45%. The left ventricle has mildly decreased function. Left ventricular septal wall thickness was normal. Normal left ventricular posterior wall thickness. There is no left  ventricular hypertrophy.  2. Abnormal septal motion consistent with left bundle branch block.  3. Idiopathic cardiomyopathy.  4. Moderately dilated left ventricular internal cavity size.  5. Global right ventricle has moderately reduced systolic function.The right ventricular size is moderately enlarged. No increase in right ventricular wall thickness.  6. Left atrial size was moderately dilated.  7. Right atrial size was moderately dilated.  8. The mitral valve is degenerative. Moderate to severe mitral valve regurgitation. Mild mitral stenosis.  9. The tricuspid valve is grossly normal. Tricuspid valve regurgitation moderate-severe.  10. The aortic valve is grossly normal. Aortic valve regurgitation is mild. Mild to moderate aortic valve sclerosis/calcification without any evidence of aortic stenosis.  11. The pulmonic valve was grossly normal. Pulmonic valve regurgitation is mild.  12. Moderately elevated pulmonary artery systolic pressure.  13. Mild ant Akinesis.  14. IVCD.   Patient Profile     59 y.o. male with history of HFrEF, valvular heart disease status post bioprosthetic aortic valve placement and mitral valve repair in 2000, hepatitis C with cirrhosis, polysubstance abuse including tobacco, alcohol, and cocaine, hemoptysis, bipolar disorder, COPD, and HTN who is being seen today for the evaluation of new onset atrial flutter and volume overload  at the request of Dr. Maryfrances Bunnell.  Assessment & Plan    1. New onset atrial flutter with RVR: -Overall, this is a very difficult situation -His tachycardic rates have likely precipitated some degree of his dyspnea and volume overload though I also suspect some of his volume is related to third spacing secondary to hypoalbuminemia -He has not a good anticoagulation candidate secondary to recently reported hemoptysis/hematemesis, thrombocytopenia with downtrending platelet count, and elevated INR in the setting of underlying liver disease with ongoing alcohol use -Not a good candidate for digoxin secondary to AKI and hypokalemic state -TEE guided cardioversion is not ideal given ongoing cocaine abuse and underlying comorbid conditions as detailed. This would commit him to anticoagulation for a minimum of 4 weeks afterwards in the setting of the above significant comorbid conditions  with high likelihood of recurrent arrhythmia secondary to ongoing alcohol abuse with malnutrition and likely recurrent electrolyte derangements -In this setting, we will pursue rate control with trial of low-dose metoprolol tartrate 12.5 mg twice daily, restarted with hold parameters   2. Chronic combined systolic and diastolic CHF, NYHA class IV: -Likely exacerbated by new onset atrial flutter with RVR with some degree of third spacing in the setting of hypoalbuminemia -IV Lasix held secondary to worsening renal function -Add back low-dose metoprolol as outlined above with hold parameters  -Not currently a candidate for further GDMT including ACE inhibitor/ARB as well as spironolactone secondary to relative hypotension and AKI -Remains on atorvastatin -Suspect a fair component of his volume status is exacerbated by atrial flutter and hypoalbuminemia   3.  Valvular heart disease: -He has previously been felt to be a poor candidate for redo valvuloplasty in the setting of medication nonadherence and polysubstance  abuse -This can be followed up by his primary cardiologist in the outpatient setting  4.  Alcoholic/hepatitis C cirrhosis: -Patient has ongoing alcohol use with cessation being recommended -High risk of esophageal varices  -Urine drug screen positive for cocaine -As patient's renal function improves we may need to consider addition of spironolactone -His hypoalbuminemia is likely contributing to his third spacing -Consider GI consult  5.  AKI: -Baseline approximately 0.9-1.2 with most recent serum creatinine worsening to 2.43 this morning -IV Lasix has been held -Continue to monitor  6.  Anemia/thrombocytopenia: -Stable -Continue to monitor with ASA    Consider palliative care consult for goals of care discussion given multiple organ failure including liver, kidney, heart, and hematologic in the setting of ongoing polysubstance abuse.  For questions or updates, please contact [facility] Please consult [email or web address] for contact info under Cardiology/STEMI.    Signed, Eula Listen, PA-C [facility] Pager: [phone] 05/21/2019, 8:36 AM

## 2019-05-21 NOTE — Progress Notes (Signed)
PROGRESS NOTE    [name]  HLK:[id number] DOB: 11/24/1960 DOA: 05/28/2019 PCP: Medicine, [employer] [facility]      Brief Narrative:  [name] is a 59 y.o. 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  Went to see his Cardiologist prior to admission, was tachypneic, tachycardic and had hypokalemia and fluid overload, sent to ER for diuresis.  In the ER, hypertensive, pulse 114, WBC 6K, platelets 91K, creatinine 1.8, procalcitonin 0.39, lactate 3, Covid negative, chest x-ray with pneumonia versus edema with small effusions.  Started on empiric antibiotics and admitted.      Assessment & Plan:   Decompensated alcoholic hep C cirrhosis Ongoing alcohol use, about 1/2 beer daily. INR 1.7, Tbili up to 10 today, albumin 2.6, Na 137, Cr up to 2.4 today.  MELD 30.  Maddrey 41.  T bili and Cr worsening.  Moderate ascites on exam.  Hypoglycemic this morning.  Paracentesis probably contraindicated in setting of worsening creatinine.  -Give albumin once today -Consult GI re: steroids, liver failure, risk of anticoagulation and risk of TEE  -Follow UDS -Low-sodium diet, fluid restriction     Acute on chronic systolic and diastolic CHF History of aortic valve repair, twice (1990, 2000) History of mitral valve repair (1990, now with moderate mitral regurgitation and stenosis) Admitted with BNP >1500, effusions on exam.  Per PCP notes, dry weight 145 pounds, near dry weight now but appears fluid overloaded on exam.   Baseline creatinine 1.2. EF 40 to 45% last October.  -Hold Lasix -Consult Cardiology, appreciate cares -Strict I/Os, daily weights, telemetry  -Daily monitoring renal function -Lisinopril, spironolactone relatively contraindicated given hypotension -Continue atorvastatin, aspirin   Atrial  flutter, typical New onset.  Started metoprolol yesterday, no improvement in rate nor conversion.   Not anticoagulation candidate at this time, due to varices, relative medical instability. -Continue metoprolol -Monitor on tele -Will discuss with Cardiology and GI regarding safety of TEE, anticoagulation long term   Abdominal pain Patient has some persistent right upper quadrant tenderness on examination again today.  He is not complaining of this at baseline, but on exam it is obvious.  Differential includes most likely alcoholic hepatitis, less likely gallbladder disease, liver abscess, SBP. -CT abdomen and pelvis without contrast ordered -Consult gastroenterology regarding diagnostic paracentesis  Possible pneumonia Procalcitonin 0.38, pneumonia possible by chest x-ray. -Continue empiric azithromycin and ceftriaxone  Hemoptysis, chest pain Patient able to state clearly this was coughed, not vomited red blood last week.  No hemoptysis since.  CT chest shows small effusion only.  Hypokalemia Hypomagnesemia Now replete  Hypotension Chronic, in the context of cirrhosis.  Acute renal failure Creatinine 1.8, baseline appears to be 0.9-1.2 since last November Today up to 2.4.  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 -Trend BMP  COPD No wheezing to suggest active bronchospasm -Continue home Trelegy  Alcohol use No real symptoms of withdrawal at present -Continue CIWA scoring with on-demand lorazepam for signs of withdrawal -Continue thiamine and folate  Thrombocytopenia Stable relative to baseline  GERD -Continue pantoprazole   Anemia of chronic disease Hemoglobin stable, no clinical bleeding here.   Depression -Continue home fluoxetine  Moderate protein calorie malnutrition As evidenced by severe chronic disease, poor oral intake here in the hospital, and diffuse loss of subcutaneous muscle mass and fat -Consult nutrition     Disposition:  The patient was admitted with dyspnea and  orthopnea in the setting of fluid overload from combined heart and liver failure.  He has failed diuresis for his heart failure, and is liver failure appears to be worsening.  His overall prognosis is poor.  We will consult palliative care.  We will consult gastroenterology, regarding supportive care for his liver failure.  He is not a transplant candidate.  His therapeutic options appear to be clinically.           MDM: The below labs and imaging reports reviewed and summarized above.  Medication management as above.  This is a very severe and worsening exacerbation of his chronic liver and heart failure.   DVT prophylaxis: SCDs Code Status: Full code Family Communication: Patient declined when asked if he would like [place] to talk to family.    Consultants:   Cardiology  GI  Procedures:   2/10 chest x-ray-right lower lobe opacity, possible pneumonia  2/11 CT chest-effusion, no pneumonia or mass  Antimicrobials:   Ceftriaxone and azithromycin 2/11>>  Culture data:   2/10 blood culture x2-no growth to date          Subjective: Poor sleep last night.  Weak, dyspneic, frequently woken for blood draws.  No new fever, confusion, hemoptysis, hematochezia    Objective: Vitals:   05/20/19 1657 05/20/19 1946 05/20/19 2218 05/21/19 0507  BP: 98/68 98/64 99/78  (!) 87/61  Pulse: (!) 109 (!) 104 (!) 108 (!) 103  Resp: 18 19  19   Temp: 98.4 F (36.9 C) 97.6 F (36.4 C)  97.8 F (36.6 C)  TempSrc:  Oral  Oral  SpO2: 99% 100% 100% 97%  Weight:      Height:        Intake/Output Summary (Last 24 hours) at 05/21/2019 0729 Last data filed at 05/20/2019 2150 Gross per 24 hour  Intake 240 ml  Output 225 ml  Net 15 ml   Filed Weights   05/28/2019 1944  Weight: 66.7 kg    Examination: General appearance: Thin adult male, alert and in no acute distress.  Appears tired, chronically ill, lying in bed on his side, arouses easily. HEENT: Anicteric, conjunctiva pink,  lids and lashes normal. No nasal deformity, discharge, epistaxis.  Lips moist, dentition in poor repair, oropharynx dry, no oral lesions, hearing normal c  Skin: Warm and dry.  No suspicious rashes or lesions.  No stigmata of chronic liver disease. Cardiac: RRR, systolic murmur, radiating into the axilla, high-pitched character.  Mild LE edema.   Jugular veins prominent. Respiratory: Normal respiratory rate and rhythm.  CTAB without rales or wheezes.  Diminished bilaterally. Abdomen: Abdomen soft.  Moderate ascites, right upper quadrant tenderness, hepatosplenomegaly noted.   MSK: No deformities or effusions of the large joints of the upper or lower extremities bilaterally.  Diffuse loss of subcutaneous muscle mass and fat. Neuro: Awake and alert. Naming is grossly intact, and the patient's recall, recent and remote, as well as general fund of knowledge seem within normal limits.  Muscle tone normal, without fasciculations.  Moves all extremities equally and with normal coordination.  Speech fluent.    Psych: Sensorium intact and responding to questions, attention normal. Affect blunted.  Judgment and insight appear normal.       Data Reviewed: I have personally reviewed following labs and imaging studies:  CBC: Recent Labs  Lab 05/14/2019 1716 05/14/2019 2018 05/20/2019 2329 05/20/19 0521 05/21/19 0631  WBC 7.8 6.7 6.9 8.3 11.1*  NEUTROABS 5.8 5.3  --   --   --  HGB 11.9* 11.8* 10.7* 10.7* 11.9*  HCT 37.3* 37.9* 33.5* 33.9* 37.5*  MCV 66.8* 68.5* 67.1* 67.9* 67.8*  PLT 93* 91* 85* 82* 97*   Basic Metabolic Panel: Recent Labs  Lab 06/04/2019 1716 06/05/2019 2018 05/31/2019 2329 05/20/19 0521 05/21/19 0631  NA 137 137 136 136 137  K 2.7* 2.7* 2.8* 3.4* 4.8  CL 90* 90* 94* 95* 95*  CO2 36* 33* 28 28 23   GLUCOSE 111* 223* 178* 106* 46*  BUN 38* 39* 39* 41* 54*  CREATININE 1.76* 1.79* 1.74* 1.84* 2.43*  CALCIUM 8.4* 8.4* 8.0* 8.1* 9.0  MG  --  1.6* 1.6*  --  2.2  PHOS  --   --  2.4*   --   --    GFR: Estimated Creatinine Clearance: 30.9 mL/min (A) (by C-G formula based on SCr of 2.43 mg/dL (H)). Liver Function Tests: Recent Labs  Lab 05/26/2019 1716 05/11/2019 2018 06/01/2019 2329 05/20/19 0521 05/21/19 0631  AST 56* 60* 52* 48* 65*  ALT 34 33 27 27 31   ALKPHOS 199* 186* 156* 153* 165*  BILITOT 10.2* 10.9* 8.9* 8.2* 10.4*  PROT 7.9 8.2* 7.2 7.3 7.7  ALBUMIN 2.7* 2.8* 2.4* 2.5* 2.6*   No results for input(s): LIPASE, AMYLASE in the last 168 hours. Recent Labs  Lab 05/26/2019 2019  AMMONIA 25   Coagulation Profile: Recent Labs  Lab 05/31/2019 1716 05/25/2019 2018  INR 1.6* 1.7*   Cardiac Enzymes: No results for input(s): CKTOTAL, CKMB, CKMBINDEX, TROPONINI in the last 168 hours. BNP (last 3 results) No results for input(s): PROBNP in the last 8760 hours. HbA1C: Recent Labs    06/05/2019 2329  HGBA1C 6.4*   CBG: No results for input(s): GLUCAP in the last 168 hours. Lipid Profile: No results for input(s): CHOL, HDL, LDLCALC, TRIG, CHOLHDL, LDLDIRECT in the last 72 hours. Thyroid Function Tests: Recent Labs    05/29/2019 1716  TSH 6.834*   Anemia Panel: No results for input(s): VITAMINB12, FOLATE, FERRITIN, TIBC, IRON, RETICCTPCT in the last 72 hours. Urine analysis:    Component Value Date/Time   COLORURINE AMBER (A) 06/04/2019 2222   APPEARANCEUR CLEAR (A) 05/16/2019 2222   APPEARANCEUR Clear 12/25/2013 0243   LABSPEC 1.009 05/28/2019 2222   LABSPEC 1.002 12/25/2013 0243   PHURINE 6.0 05/20/2019 2222   GLUCOSEU NEGATIVE 05/11/2019 2222   GLUCOSEU Negative 12/25/2013 0243   HGBUR NEGATIVE 05/31/2019 2222   BILIRUBINUR NEGATIVE 05/17/2019 2222   BILIRUBINUR Negative 12/25/2013 0243   KETONESUR NEGATIVE 05/17/2019 2222   PROTEINUR NEGATIVE 05/18/2019 2222   NITRITE NEGATIVE 05/18/2019 2222   LEUKOCYTESUR NEGATIVE 05/26/2019 2222   LEUKOCYTESUR Negative 12/25/2013 0243   Sepsis Labs: @LABRCNTIP (procalcitonin:4,lacticacidven:4)  ) Recent  Results (from the past 240 hour(s))  Blood culture (routine x 2)     Status: None (Preliminary result)   Collection Time: 05/10/2019  8:18 PM   Specimen: BLOOD  Result Value Ref Range Status   Specimen Description BLOOD RIGHT FOREARM  Final   Special Requests   Final    BOTTLES DRAWN AEROBIC AND ANAEROBIC Blood Culture adequate volume   Culture   Final    NO GROWTH 2 DAYS Performed at [facility] [address] [place], [facility] [address]    Report Status PENDING  Incomplete  Blood culture (routine x 2)     Status: None (Preliminary result)   Collection Time: 05/18/2019  8:18 PM   Specimen: BLOOD  Result Value Ref Range Status   Specimen Description BLOOD  RIGHT ANTECUBITAL  Final   Special Requests   Final    BOTTLES DRAWN AEROBIC AND ANAEROBIC Blood Culture adequate volume   Culture   Final    NO GROWTH 2 DAYS Performed at [facility], [address], [place], [place] [address]    Report Status PENDING  Incomplete  Respiratory Panel by RT PCR (Flu A&B, Covid) - Nasopharyngeal Swab     Status: None   Collection Time: 2019-06-13  8:19 PM   Specimen: Nasopharyngeal Swab  Result Value Ref Range Status   SARS Coronavirus 2 by RT PCR NEGATIVE NEGATIVE Final    Comment: (NOTE) SARS-CoV-2 target nucleic acids are NOT DETECTED. The SARS-CoV-2 RNA is generally detectable in upper respiratoy specimens during the acute phase of infection. The lowest concentration of SARS-CoV-2 viral copies this assay can detect is 131 copies/mL. A negative result does not preclude SARS-Cov-2 infection and should not be used as the sole basis for treatment or other patient management decisions. A negative result may occur with  improper specimen collection/handling, submission of specimen other than nasopharyngeal swab, presence of viral mutation(s) within the areas targeted by this assay, and inadequate number of viral copies (<131 copies/mL). A negative result must be combined with  clinical observations, patient history, and epidemiological information. The expected result is Negative. Fact Sheet for Patients:  https://www.moore.com/ Fact Sheet for Healthcare Providers:  https://www.young.biz/ This test is not yet ap proved or cleared by the [place] FDA and  has been authorized for detection and/or diagnosis of SARS-CoV-2 by FDA under an Emergency Use Authorization (EUA). This EUA will remain  in effect (meaning this test can be used) for the duration of the COVID-19 declaration under Section 564(b)(1) of the Act, 21 U.S.C. section 360bbb-3(b)(1), unless the authorization is terminated or revoked sooner.    Influenza A by PCR NEGATIVE NEGATIVE Final   Influenza B by PCR NEGATIVE NEGATIVE Final    Comment: (NOTE) The Xpert Xpress SARS-CoV-2/FLU/RSV assay is intended as an aid in  the diagnosis of influenza from Nasopharyngeal swab specimens and  should not be used as a sole basis for treatment. Nasal washings and  aspirates are unacceptable for Xpert Xpress SARS-CoV-2/FLU/RSV  testing. Fact Sheet for Patients: https://www.moore.com/ Fact Sheet for Healthcare Providers: https://www.young.biz/ This test is not yet approved or cleared by the [place] FDA and  has been authorized for detection and/or diagnosis of SARS-CoV-2 by  FDA under an Emergency Use Authorization (EUA). This EUA will remain  in effect (meaning this test can be used) for the duration of the  Covid-19 declaration under Section 564(b)(1) of the Act, 21  U.S.C. section 360bbb-3(b)(1), unless the authorization is  terminated or revoked. Performed at [facility], [address], [place], [place] [address]          Radiology Studies: CT CHEST WO CONTRAST  Result Date: 05/20/2019 CLINICAL DATA:  Short of breath and cough. Recent COVID-19 exposure, negative test. 59 y.o. male with chronic heart  failure, valvular disease with bioprosthetic aortic valve, mitral valve repair in 2000, hypertension, COPD, bipolar, hepatitis C with cirrhosis and polysubstance abuse who comes in for abnormal lab. Pt states he came here today for abnormal blood work. His potassium was low. Patient states that he just not been feeling well for the past few days. EXAM: CT CHEST WITHOUT CONTRAST TECHNIQUE: Multidetector CT imaging of the chest was performed following the standard protocol without IV contrast. COMPARISON:  08/07/2011 FINDINGS: Cardiovascular: Stable changes from previous  cardiac surgery and mitral and aortic valve replacements. Heart is mildly enlarged. No pericardial effusion. Left coronary artery calcifications. Aorta is normal in caliber. There atherosclerotic calcifications. Main pulmonary artery dilated to 4.2 cm. Mediastinum/Nodes: No mediastinal or hilar masses. No enlarged lymph nodes. Trachea and esophagus are unremarkable. Lungs/Pleura: Small right pleural effusion. Fluid extends along the inferior right oblique fissure. No left pleural effusion. Dependent opacity in the right lower lobe is consistent with atelectasis. Azygos lobe with paraseptal emphysematous change noted in the right upper lobe anteriorly and medially. No lung mass or suspicious nodule. No convincing pneumonia and no pulmonary edema. No pneumothorax. Upper Abdomen: Small amount of ascites adjacent to the liver. No acute findings. Musculoskeletal: No fracture or acute finding. No osteoblastic or osteolytic lesions. IMPRESSION: 1. Right pleural effusion associated with dependent lower lobe atelectasis. 2. No evidence of pneumonia.  No pulmonary edema. 3. Mild right upper lobe paraseptal emphysema. 4. Mild enlargement the heart with changes from previous cardiac surgery and aortic and mitral valve replacements. Aortic atherosclerosis. Mild left coronary artery atherosclerosis. 5. Small amount of ascites in the visualized upper abdomen. Aortic  Atherosclerosis (ICD10-I70.0) and Emphysema (ICD10-J43.9). Electronically Signed   By: Amie Portland M.D.   On: 05/20/2019 12:38   DG Chest Portable 1 View  Result Date: 05/20/2019 CLINICAL DATA:  Shortness of breath. Pt to the er sent by his MD for abnormal labs. Pt says his potassium is low. EXAM: PORTABLE CHEST 1 VIEW COMPARISON:  Chest radiograph 01/30/2019 FINDINGS: Stable cardiomediastinal contours with enlarged heart size status post median sternotomy. Mild diffuse bilateral interstitial opacities. There are increased heterogeneous opacities at the right lung base with associated small right pleural effusion. There are mild linear opacities at the left lung base likely atelectasis or scarring. No pneumothorax. No acute finding in the visualized skeleton. IMPRESSION: Mild edema. Increased heterogeneous opacities at the right lung base, possibly representing atelectasis or infection, with associated small right pleural effusion. Mild opacities at the left base likely atelectasis. Electronically Signed   By: Emmaline Kluver M.D.   On: 05/17/2019 20:29        Scheduled Meds:  aspirin EC  81 mg Oral Daily   atorvastatin  40 mg Oral q1800   azithromycin  500 mg Oral Daily   feeding supplement (ENSURE ENLIVE)  237 mL Oral TID BM   FLUoxetine  20 mg Oral Daily   folic acid  1 mg Oral Daily   magnesium oxide  400 mg Oral BID   mirtazapine  15 mg Oral QHS   multivitamin with minerals  1 tablet Oral Daily   pantoprazole  40 mg Oral Daily   potassium chloride  40 mEq Oral BID   thiamine  100 mg Oral Daily   Continuous Infusions:  albumin human     cefTRIAXone (ROCEPHIN)  IV 1 g (05/20/19 2034)     LOS: 2 days    Time spent: 35 minutes    Alberteen Sam, MD [facility] 05/21/2019, 7:29 AM     Please page though AMION or Epic secure chat:  For [employer], Higher education careers adviser

## 2019-05-21 NOTE — Consult Note (Signed)
Midge Minium, MD [facility]  [address], [address] [place], [place] [address] Phone: [phone] Fax : [phone]  Consultation  Referring Provider:     Dr. Maryfrances Bunnell Primary Care Physician:  Medicine, [facility] Gastroenterologist:  Dr. Allegra Lai        Reason for Consultation:     Alcoholic liver disease  Date of Admission:  May 22, 2019 Date of Consultation:  05/21/2019         HPI:   [name] is a 59 y.o. male with cirrhosis of the liver.  The patient had been in the hospital back in October 2020 for cirrhosis of the liver.  He states that he cut down on drinking with only 2 to 3 beers a day which he states were 40 ounces.  The patient was now admitted with a INR of 1.6 that has gone up to 2.5 today.  The patient's liver function tests have shown:  Component     Latest Ref Rng & Units 02/07/2019 May 22, 2019 2019-05-22 22-May-2019          5:16 PM  8:18 PM 11:29 PM  AST     15 - 41 U/L 449 (H) 56 (H) 60 (H) 52 (H)  ALT     0 - 44 U/L 495 (H) 34 33 27  Alkaline Phosphatase     38 - 126 U/L 125 199 (H) 186 (H) 156 (H)  Total Bilirubin     0.3 - 1.2 mg/dL 2.2 (H) 97.4 (H) 16.3 (H) 8.9 (H)   Component     Latest Ref Rng & Units 05/20/2019 05/21/2019           AST     15 - 41 U/L 48 (H) 65 (H)  ALT     0 - 44 U/L 27 31  Alkaline Phosphatase     38 - 126 U/L 153 (H) 165 (H)  Total Bilirubin     0.3 - 1.2 mg/dL 8.2 (H) 84.5 (H)    The patient reports that he coughs up blood but denies any vomiting of blood.  He also denies any black stools or bloody stools.  The patient's hemoglobin is 10.7 yesterday and 11.9 today. The patient has also developed atrial flutter.  Past Medical History:  Diagnosis Date  . Alcohol abuse   . Bipolar 1 disorder (HCC)   . CHF (congestive heart failure) (HCC)   . COPD (chronic obstructive pulmonary disease) (HCC)   . Hip pain   . Hypertension   . Pulmonary HTN (HCC)   . Valvular heart disease     Past Surgical History:  Procedure  Laterality Date  . AORTIC VALVE REPLACEMENT  2000    Prior to Admission medications   Medication Sig Start Date End Date Taking? Authorizing Provider  acetaminophen (TYLENOL) 500 MG tablet Take 1,000 mg by mouth every 8 (eight) hours. 10/30/18 10/30/19 Yes [provider]  albuterol (VENTOLIN HFA) 108 (90 Base) MCG/ACT inhaler Inhale 2 puffs into the lungs every 4 (four) hours as needed for wheezing or shortness of breath. 01/21/19  Yes Shaune Pollack, MD  aspirin EC 81 MG tablet Take 81 mg by mouth daily.   Yes [provider]  atorvastatin (LIPITOR) 40 MG tablet Take 40 mg by mouth daily. 10/30/18 10/30/19 Yes [provider]  DULoxetine (CYMBALTA) 30 MG capsule Take 30 mg by mouth daily. 09/08/18  Yes [provider]  FLUoxetine (PROZAC) 20 MG capsule Take 20 mg by mouth every morning.  08/26/18  Yes [provider]  gabapentin (NEURONTIN) 300 MG capsule Take 1 capsule (300 mg total) by mouth 3 (three) times daily. 02/15/19 02/15/20 Yes Hackney, Inetta Fermo A, FNP  ipratropium (ATROVENT HFA) 17 MCG/ACT inhaler Inhale 2 puffs into the lungs 4 (four) times daily. 02/15/19 02/15/20 Yes Hackney, Tina A, FNP  meloxicam (MOBIC) 15 MG tablet Take 15 mg by mouth daily.   Yes [provider]  mirtazapine (REMERON) 15 MG tablet Take 15 mg by mouth at bedtime. 09/08/18 09/08/19 Yes [provider]  naproxen (NAPROSYN) 500 MG tablet Take 500 mg by mouth 2 (two) times daily with a meal.   Yes [provider]  potassium chloride 20 MEQ TBCR Take 20 mEq by mouth daily. 01/28/19  Yes Hackney, Inetta Fermo A, FNP  torsemide (DEMADEX) 20 MG tablet Take 3 tablets (60 mg total) by mouth daily. 02/15/19 05/12/2019 Yes Clarisa Kindred A, FNP  metoprolol succinate (TOPROL-XL) 25 MG 24 hr tablet Take 0.5 tablets (12.5 mg total) by mouth daily. 05/05/19   End, Cristal Deer, MD    Family History  Problem Relation Age of Onset  . Heart failure Father   . Hypertension Father       Social History   Tobacco Use  . Smoking status: Current Every Day Smoker    Packs/day: 0.25    Types: Cigarettes  . Smokeless tobacco: Never Used  Substance Use Topics  . Alcohol use: Yes    Alcohol/week: 2.0 standard drinks    Types: 2 Cans of beer per week    Comment: drinks 40 ounce beers  . Drug use: Yes    Types: Cocaine, Marijuana    Comment: last did last week    Allergies as of 05/21/2019  . (No Known Allergies)    Review of Systems:    All systems reviewed and negative except where noted in HPI.   Physical Exam:  Vital signs in last 24 hours: Temp:  [97.6 F (36.4 C)-98.4 F (36.9 C)] 98 F (36.7 C) (02/12 1124) Pulse Rate:  [95-109] 96 (02/12 1124) Resp:  [18-19] 18 (02/12 1124) BP: (87-102)/(61-78) 102/67 (02/12 1124) SpO2:  [97 %-100 %] 99 % (02/12 1124) Last BM Date: 05/20/19 General:   Pleasant, cooperative in NAD Head:  Normocephalic and atraumatic. Eyes:   No icterus.   Conjunctiva pink. PERRLA. Ears:  Normal auditory acuity. Neck:  Supple; no masses or thyroidomegaly Lungs: Respirations even and unlabored. Lungs clear to auscultation bilaterally.   No wheezes, crackles, or rhonchi.  Heart: Regular rate and rhythm;  Without murmur, clicks, rubs or gallops Abdomen:  Soft, nondistended, nontender. Normal bowel sounds. No appreciable masses or hepatomegaly.  No rebound or guarding.  Rectal:  Not performed. Msk:  Symmetrical without gross deformities.    Extremities:  Without edema, cyanosis or clubbing. Neurologic:  Alert and oriented x3;  grossly normal neurologically. Skin:  Intact without significant lesions or rashes. Cervical Nodes:  No significant cervical adenopathy. Psych:  Alert and cooperative. Normal affect.  LAB RESULTS: Recent Labs    05/12/2019 2329 05/20/19 0521 05/21/19 0631  WBC 6.9 8.3 11.1*  HGB 10.7* 10.7* 11.9*  HCT 33.5* 33.9* 37.5*  PLT 85* 82* 97*   BMET Recent Labs    05/21/2019 2329 05/20/19 0521 05/21/19 0631   NA 136 136 137  K 2.8* 3.4* 4.8  CL 94* 95* 95*  CO2 28 28 23   GLUCOSE 178* 106* 46*  BUN 39* 41* 54*  CREATININE 1.74* 1.84* 2.43*  CALCIUM  8.0* 8.1* 9.0   LFT Recent Labs    05/21/19 0631  PROT 7.7  ALBUMIN 2.6*  AST 65*  ALT 31  ALKPHOS 165*  BILITOT 10.4*  BILIDIR 5.9*  IBILI 4.5*   PT/INR Recent Labs    Jun 03, 2019 2018 05/21/19 0921  LABPROT 19.8* 26.5*  INR 1.7* 2.5*    STUDIES: CT ABDOMEN PELVIS WO CONTRAST  Result Date: 05/21/2019 CLINICAL DATA:  Diffuse abdominal pain with increased right upper quadrant pain. EXAM: CT ABDOMEN AND PELVIS WITHOUT CONTRAST TECHNIQUE: Multidetector CT imaging of the abdomen and pelvis was performed following the standard protocol without IV contrast. COMPARISON:  CT of the abdomen and pelvis without contrast 02/04/2019. Liver ultrasound 02/05/2019 FINDINGS: Lower chest: Bilateral pleural effusions are again seen, right greater than left. Associated airspace disease is present in the right lower lobe. Minimal atelectasis or scarring is present on left. The heart is enlarged. 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 No significant pericardial effusion is present. Hepatobiliary: Liver enlargement is stable, measuring 1.9 cm cephalo caudad. Liver is diffusely hypodense without focal lesions. There is some nodularity of the border. Gallbladder is contracted. Common bile duct is unremarkable. Pancreas: Unremarkable. No pancreatic ductal dilatation or surrounding inflammatory changes. Spleen: Normal in size without focal abnormality. Adrenals/Urinary Tract: Adrenal glands are normal bilaterally. A single punctate nonobstructing stone is present in the left kidney. No other focal lesions are present. Ureters are normal. The urinary bladder is within normal limits. Stomach/Bowel: Stomach is moderately distended. No mass lesion is present. The small bowel is unremarkable. Terminal ileum is within normal limits. Appendix is visualized and normal.  The ascending and transverse colon are within normal limits. Diverticular changes are present in the sigmoid colon without focal inflammation to suggest diverticulitis. Vascular/Lymphatic: Atherosclerotic calcifications are present throughout the aorta and branch vessels. Dense calcifications and probable stenosis are again noted in the proximal left iliac artery. Reproductive: Prostate is unremarkable. Other: Diffuse abdominal ascites is noted. There is fluid about the liver. Extensive subcutaneous edema is present as well. No discrete fluid collection or abscess is present. Musculoskeletal: Sclerotic changes are present at L5-S1. There is no significant change. Slight degenerative anterolisthesis is again noted at L3-4 and L4-5. No focal lytic or blastic lesions are present. Advanced degenerative changes are again noted in the left hip with diffused cystic changes on both sides the joint. IMPRESSION: 1. Diffuse abdominal ascites and diffuse subcutaneous edema compatible with anasarca. 2. Hepatomegaly and diffuse fatty infiltration of the liver without focal lesion. Irregular hepatic border is similar the prior study, suggesting cirrhosis 3. Punctate nonobstructing stone in the left kidney. 4. Sigmoid diverticulosis without diverticulitis. 5. Aortic Atherosclerosis (ICD10-I70.0). Extensive atherosclerotic disease with probable stenosis in the proximal left iliac artery. 6. Bilateral pleural effusions, right greater than left. 7. Right lower lobe airspace disease concerning for pneumonia. Electronically Signed   By: Marin Roberts M.D.   On: 05/21/2019 13:33   CT CHEST WO CONTRAST  Result Date: 05/20/2019 CLINICAL DATA:  Short of breath and cough. Recent COVID-19 exposure, negative test. 59 y.o. male with chronic heart failure, valvular disease with bioprosthetic aortic valve, mitral valve repair in 2000, hypertension, COPD, bipolar, hepatitis C with cirrhosis and polysubstance abuse who comes in for  abnormal lab. Pt states he came here today for abnormal blood work. His potassium was low. Patient states that he just not been feeling well for the past few days. EXAM: CT CHEST WITHOUT CONTRAST TECHNIQUE: Multidetector CT imaging of the chest was  performed following the standard protocol without IV contrast. COMPARISON:  08/07/2011 FINDINGS: Cardiovascular: Stable changes from previous cardiac surgery and mitral and aortic valve replacements. Heart is mildly enlarged. No pericardial effusion. Left coronary artery calcifications. Aorta is normal in caliber. There atherosclerotic calcifications. Main pulmonary artery dilated to 4.2 cm. Mediastinum/Nodes: No mediastinal or hilar masses. No enlarged lymph nodes. Trachea and esophagus are unremarkable. Lungs/Pleura: Small right pleural effusion. Fluid extends along the inferior right oblique fissure. No left pleural effusion. Dependent opacity in the right lower lobe is consistent with atelectasis. Azygos lobe with paraseptal emphysematous change noted in the right upper lobe anteriorly and medially. No lung mass or suspicious nodule. No convincing pneumonia and no pulmonary edema. No pneumothorax. Upper Abdomen: Small amount of ascites adjacent to the liver. No acute findings. Musculoskeletal: No fracture or acute finding. No osteoblastic or osteolytic lesions. IMPRESSION: 1. Right pleural effusion associated with dependent lower lobe atelectasis. 2. No evidence of pneumonia.  No pulmonary edema. 3. Mild right upper lobe paraseptal emphysema. 4. Mild enlargement the heart with changes from previous cardiac surgery and aortic and mitral valve replacements. Aortic atherosclerosis. Mild left coronary artery atherosclerosis. 5. Small amount of ascites in the visualized upper abdomen. Aortic Atherosclerosis (ICD10-I70.0) and Emphysema (ICD10-J43.9). Electronically Signed   By: Lajean Manes M.D.   On: 05/20/2019 12:38   DG Chest Portable 1 View  Result Date:  06/02/19 CLINICAL DATA:  Shortness of breath. Pt to the er sent by his MD for abnormal labs. Pt says his potassium is low. EXAM: PORTABLE CHEST 1 VIEW COMPARISON:  Chest radiograph 01/30/2019 FINDINGS: Stable cardiomediastinal contours with enlarged heart size status post median sternotomy. Mild diffuse bilateral interstitial opacities. There are increased heterogeneous opacities at the right lung base with associated small right pleural effusion. There are mild linear opacities at the left lung base likely atelectasis or scarring. No pneumothorax. No acute finding in the visualized skeleton. IMPRESSION: Mild edema. Increased heterogeneous opacities at the right lung base, possibly representing atelectasis or infection, with associated small right pleural effusion. Mild opacities at the left base likely atelectasis. Electronically Signed   By: Audie Pinto M.D.   On: 06/02/19 20:29      Impression / Plan:   Assessment: Principal Problem:   Hypokalemia Active Problems:   Pulmonary hypertension (HCC)   COPD (chronic obstructive pulmonary disease) (HCC)   Bipolar 1 disorder (HCC)   AKI (acute kidney injury) ([place])   Hypomagnesemia   Alcoholic hepatitis with ascites   Hyperglycemia   [name] is a 59 y.o. y/o male with acute on chronic liver disease with cirrhosis and alcoholic hepatitis.  The patient's meld score is 34 giving him a 53% 67-month mortality.  The patient's discriminant function is also over 40.  The patient CT scan showed diverticulosis with a nonobstructing left kidney stone and irregularity of the liver with hepatomegaly and diffuse fatty infiltration consistent with cirrhosis.  There is also ascites seen in the abdomen.  Plan:  This patient has a poor prognosis due to his increased meld score and discriminate function.  The patient will be started on prednisone 40 mg a day.  There is a question of whether the patient can undergo a TEE.  There is no evidence that the  patient has varices although having varices is likely in this patient despite not having reported collaterals on his imaging.  That being said the patient's risk of having a scope for a transesophageal echo causing any damage to the  varices is a low risk.  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  As far as anticoagulation since the patient has new onset of atrial flutterI have discussed this with cardiology who agrees that the patient is a high risk for anticoagulation and with his INR already at 3.5 he may be at a low risk for developing a clot with his atrial flutter.  Therefore I would hold off on any anticoagulation at this time and continue supportive measures with instituting prednisone daily.  Of note prednisone for alcoholic hepatitis does decrease short-term mortality but not 98-month mortality.  Thank you for involving me in the care of this patient.      LOS: 2 days   Midge Minium, MD  05/21/2019, 2:48 PM Pager [phone] 7am-5pm  Check AMION for 5pm -7am coverage and on weekends   Note: This dictation was prepared with Dragon dictation along with smaller phrase technology. Any transcriptional errors that result from this process are unintentional.

## 2019-05-22 DIAGNOSIS — Z515 Encounter for palliative care: Secondary | ICD-10-CM

## 2019-05-22 LAB — HEPATIC FUNCTION PANEL
ALT: 28 U/L (ref 0–44)
AST: 55 U/L — ABNORMAL HIGH (ref 15–41)
Albumin: 2.5 g/dL — ABNORMAL LOW (ref 3.5–5.0)
Alkaline Phosphatase: 158 U/L — ABNORMAL HIGH (ref 38–126)
Bilirubin, Direct: 5.7 mg/dL — ABNORMAL HIGH (ref 0.0–0.2)
Indirect Bilirubin: 3.8 mg/dL — ABNORMAL HIGH (ref 0.3–0.9)
Total Bilirubin: 9.5 mg/dL — ABNORMAL HIGH (ref 0.3–1.2)
Total Protein: 7.1 g/dL (ref 6.5–8.1)

## 2019-05-22 LAB — CBC
HCT: 34.5 % — ABNORMAL LOW (ref 39.0–52.0)
Hemoglobin: 10.9 g/dL — ABNORMAL LOW (ref 13.0–17.0)
MCH: 21.6 pg — ABNORMAL LOW (ref 26.0–34.0)
MCHC: 31.6 g/dL (ref 30.0–36.0)
MCV: 68.5 fL — ABNORMAL LOW (ref 80.0–100.0)
Platelets: 91 10*3/uL — ABNORMAL LOW (ref 150–400)
RBC: 5.04 MIL/uL (ref 4.22–5.81)
RDW: 25.3 % — ABNORMAL HIGH (ref 11.5–15.5)
WBC: 9.2 10*3/uL (ref 4.0–10.5)
nRBC: 0.5 % — ABNORMAL HIGH (ref 0.0–0.2)

## 2019-05-22 LAB — BASIC METABOLIC PANEL
Anion gap: 12 (ref 5–15)
BUN: 73 mg/dL — ABNORMAL HIGH (ref 6–20)
CO2: 26 mmol/L (ref 22–32)
Calcium: 8.6 mg/dL — ABNORMAL LOW (ref 8.9–10.3)
Chloride: 94 mmol/L — ABNORMAL LOW (ref 98–111)
Creatinine, Ser: 3.32 mg/dL — ABNORMAL HIGH (ref 0.61–1.24)
GFR calc Af Amer: 22 mL/min — ABNORMAL LOW (ref >60)
GFR calc non Af Amer: 19 mL/min — ABNORMAL LOW (ref >60)
Glucose, Bld: 140 mg/dL — ABNORMAL HIGH (ref 70–99)
Potassium: 4.6 mmol/L (ref 3.5–5.1)
Sodium: 132 mmol/L — ABNORMAL LOW (ref 135–145)

## 2019-05-22 MED ORDER — ALPRAZOLAM 0.5 MG PO TABS: 1.0000 mg | ORAL_TABLET | ORAL | Status: DC

## 2019-05-22 MED ORDER — METOPROLOL TARTRATE 25 MG PO TABS
12.5000 mg | ORAL_TABLET | Freq: Two times a day (BID) | ORAL | Status: DC | PRN
  Administered 2019-05-23: 12.5 mg via ORAL
  Filled 2019-05-22: qty 1

## 2019-05-22 MED ORDER — ALBUMIN HUMAN 5 % IV SOLN: 25.0000 g | Freq: Three times a day (TID) | INTRAVENOUS | Status: DC

## 2019-05-22 MED ORDER — SODIUM CHLORIDE 0.9 % IV SOLN
INTRAVENOUS | Status: DC | PRN
  Administered 2019-05-22: 500 mL via INTRAVENOUS

## 2019-05-22 MED ORDER — NON FORMULARY: 3.0000 mg | Freq: Every day | Status: DC

## 2019-05-22 MED ORDER — ALBUMIN HUMAN 5 % IV SOLN
25.0000 g | Freq: Three times a day (TID) | INTRAVENOUS | Status: DC
  Administered 2019-05-22: 23:00:00 25 g via INTRAVENOUS
  Filled 2019-05-22 (×3): qty 500

## 2019-05-22 MED ORDER — ALBUMIN HUMAN 5 % IV SOLN
25.0000 g | Freq: Every day | INTRAVENOUS | Status: DC
  Filled 2019-05-22: qty 500

## 2019-05-22 MED ORDER — MELATONIN 5 MG PO TABS
2.5000 mg | ORAL_TABLET | Freq: Every day | ORAL | Status: DC
  Administered 2019-05-22 – 2019-05-25 (×4): 2.5 mg via ORAL
  Filled 2019-05-22 (×4): qty 1

## 2019-05-22 NOTE — Consult Note (Signed)
[facility] CONSULT NOTE    Date: 05/22/2019                  Patient Name:  [name]  MRN: [id number]  DOB: 1961-01-16  Age / Sex: 59 y.o., male         PCP: [facility], [place] Requesting Consult: Hospitalist                 Reason for Consult: Acute kidney injury            History of Present Illness: Patient is a 59 y.o. male with a PMHx of alcohol abuse, bipolar disorder, congestive heart failure, COPD, hypertension, valvular heart disease, who was admitted to [facility] on 2019/06/09 for evaluation of shortness of breath as well as acute kidney injury.  Patient has known underlying alcohol abuse.  He has been actively drinking 1-2 alcoholic beverages per day.  He appears to have acute liver dysfunction now.  He has been started on prednisone for alcoholic hepatitis.  We are now asked to see him for evaluation management of acute kidney injury.  The patient's baseline creatinine appears to be 0.9.  Creatinine now up to 3.3.   Medications: Outpatient medications: Medications Prior to Admission  Medication Sig Dispense Refill Last Dose  . acetaminophen (TYLENOL) 500 MG tablet Take 1,000 mg by mouth every 8 (eight) hours.   Jun 09, 2019 at Unknown time  . albuterol (VENTOLIN HFA) 108 (90 Base) MCG/ACT inhaler Inhale 2 puffs into the lungs every 4 (four) hours as needed for wheezing or shortness of breath. 6.7 g 0 prn at prn  . aspirin EC 81 MG tablet Take 81 mg by mouth daily.   06-09-2019 at Unknown time  . atorvastatin (LIPITOR) 40 MG tablet Take 40 mg by mouth daily.   2019/06/09 at Unknown time  . DULoxetine (CYMBALTA) 30 MG capsule Take 30 mg by mouth daily.   Jun 09, 2019 at Unknown time  . FLUoxetine (PROZAC) 20 MG capsule Take 20 mg by mouth every morning.   Jun 09, 2019 at Unknown time  . gabapentin (NEURONTIN) 300 MG capsule Take 1 capsule (300 mg total) by mouth 3 (three) times daily. 90 capsule 3 Jun 09, 2019 at Unknown time  .  ipratropium (ATROVENT HFA) 17 MCG/ACT inhaler Inhale 2 puffs into the lungs 4 (four) times daily. 1 Inhaler 5 06/09/19 at Unknown time  . meloxicam (MOBIC) 15 MG tablet Take 15 mg by mouth daily.   2019/06/09 at Unknown time  . mirtazapine (REMERON) 15 MG tablet Take 15 mg by mouth at bedtime.   05/18/2019 at Unknown time  . naproxen (NAPROSYN) 500 MG tablet Take 500 mg by mouth 2 (two) times daily with a meal.   2019/06/09 at Unknown time  . potassium chloride 20 MEQ TBCR Take 20 mEq by mouth daily. 30 tablet 5 09-Jun-2019 at Unknown time  . torsemide (DEMADEX) 20 MG tablet Take 3 tablets (60 mg total) by mouth daily. 90 tablet 3 2019-06-09 at Unknown time  . metoprolol succinate (TOPROL-XL) 25 MG 24 hr tablet Take 0.5 tablets (12.5 mg total) by mouth daily. 45 tablet 2     Current medications: Current Facility-Administered Medications  Medication Dose Route Frequency Provider Last Rate Last Admin  . albumin human 5 % solution 25 g  25 g Intravenous Daily [name], Tally Due, MD      . albuterol (PROVENTIL) (  2.5 MG/3ML) 0.083% nebulizer solution 2.5 mg  2.5 mg Nebulization Q4H PRN Danford, Earl Liteshristopher P, MD      . aspirin EC tablet 81 mg  81 mg Oral Daily Alberteen Samanford, Christopher P, MD   81 mg at 05/22/19 0901  . atorvastatin (LIPITOR) tablet 40 mg  40 mg Oral q1800 Alberteen Samanford, Christopher P, MD   40 mg at 05/21/19 1706  . azithromycin (ZITHROMAX) tablet 500 mg  500 mg Oral Daily Alberteen Samanford, Christopher P, MD   500 mg at 05/21/19 2104  . cefTRIAXone (ROCEPHIN) 1 g in sodium chloride 0.9 % 100 mL IVPB  1 g Intravenous Q24H Alberteen Samanford, Christopher P, MD 200 mL/hr at 05/21/19 2105 1 g at 05/21/19 2105  . feeding supplement (ENSURE ENLIVE) (ENSURE ENLIVE) liquid 237 mL  237 mL Oral TID BM Danford, Earl Liteshristopher P, MD   237 mL at 05/22/19 0901  . FLUoxetine (PROZAC) capsule 20 mg  20 mg Oral Daily Danford, Earl Liteshristopher P, MD   20 mg at 05/22/19 0901  . ipratropium-albuterol (DUONEB) 0.5-2.5 (3) MG/3ML nebulizer solution 3  mL  3 mL Nebulization TID Alberteen Samanford, Christopher P, MD   3 mL at 05/22/19 1346  . magnesium oxide (MAG-OX) tablet 400 mg  400 mg Oral BID Earlie LouGarba, Mohammad L, MD   400 mg at 05/22/19 0901  . Melatonin TABS 2.5 mg  2.5 mg Oral QHS Danford, Earl Liteshristopher P, MD      . metoprolol tartrate (LOPRESSOR) tablet 12.5 mg  12.5 mg Oral BID PRN Eula Listenunn, Ryan M, PA-C      . mirtazapine (REMERON) tablet 15 mg  15 mg Oral QHS Alberteen Samanford, Christopher P, MD   15 mg at 05/21/19 2245  . multivitamin with minerals tablet 1 tablet  1 tablet Oral Daily Rometta EmeryGarba, Mohammad L, MD   1 tablet at 05/22/19 0901  . ondansetron (ZOFRAN) tablet 4 mg  4 mg Oral Q6H PRN Rometta EmeryGarba, Mohammad L, MD       Or  . ondansetron (ZOFRAN) injection 4 mg  4 mg Intravenous Q6H PRN Earlie LouGarba, Mohammad L, MD      . pantoprazole (PROTONIX) EC tablet 40 mg  40 mg Oral Daily Earlie LouGarba, Mohammad L, MD   40 mg at 05/22/19 0901  . predniSONE (DELTASONE) tablet 40 mg  40 mg Oral QAC breakfast Midge MiniumWohl, Darren, MD   40 mg at 05/22/19 0901      Allergies: No Known Allergies    Past Medical History: Past Medical History:  Diagnosis Date  . Alcohol abuse   . Bipolar 1 disorder (HCC)   . CHF (congestive heart failure) (HCC)   . COPD (chronic obstructive pulmonary disease) (HCC)   . Hip pain   . Hypertension   . Pulmonary HTN (HCC)   . Valvular heart disease      Past Surgical History: Past Surgical History:  Procedure Laterality Date  . AORTIC VALVE REPLACEMENT  2000     Family History: Family History  Problem Relation Age of Onset  . Heart failure Father   . Hypertension Father      Social History: Social History   Socioeconomic History  . Marital status: Single    Spouse name: Not on file  . Number of children: Not on file  . Years of education: Not on file  . Highest education level: Not on file  Occupational History  . Occupation: disabled  Tobacco Use  . Smoking status: Current Every Day Smoker    Packs/day: 0.25    Types: Cigarettes  .  Smokeless tobacco: Never Used  Substance and Sexual Activity  . Alcohol use: Yes    Alcohol/week: 2.0 standard drinks    Types: 2 Cans of beer per week    Comment: drinks 40 ounce beers  . Drug use: Yes    Types: Cocaine, Marijuana    Comment: last did last week  . Sexual activity: Not Currently  Other Topics Concern  . Not on file  Social History Narrative  . Not on file   Social Determinants of Health   Financial Resource Strain: Low Risk   . Difficulty of Paying Living Expenses: Not very hard  Food Insecurity: No Food Insecurity  . Worried About Programme researcher, broadcasting/film/video in the Last Year: Never true  . Ran Out of Food in the Last Year: Never true  Transportation Needs: No Transportation Needs  . Lack of Transportation (Medical): No  . Lack of Transportation (Non-Medical): No  Physical Activity: Unknown  . Days of Exercise per Week: 2 days  . Minutes of Exercise per Session: Not on file  Stress: Stress Concern Present  . Feeling of Stress : To some extent  Social Connections: Somewhat Isolated  . Frequency of Communication with Friends and Family: More than three times a week  . Frequency of Social Gatherings with Friends and Family: More than three times a week  . Attends Religious Services: 1 to 4 times per year  . Active Member of Clubs or Organizations: No  . Attends Banker Meetings: Never  . Marital Status: Never married  Intimate Partner Violence: Not At Risk  . Fear of Current or Ex-Partner: No  . Emotionally Abused: No  . Physically Abused: No  . Sexually Abused: No     Review of Systems: Review of Systems  Constitutional: Positive for malaise/fatigue. Negative for chills and fever.  HENT: Negative for congestion, hearing loss and tinnitus.   Eyes: Negative for blurred vision and double vision.  Respiratory: Positive for shortness of breath. Negative for cough and sputum production.   Cardiovascular: Negative for chest pain, palpitations and  orthopnea.  Gastrointestinal: Negative for diarrhea, nausea and vomiting.  Genitourinary: Negative for dysuria, frequency and urgency.  Musculoskeletal: Negative for myalgias.  Skin: Negative for itching and rash.  Neurological: Negative for dizziness and focal weakness.  Endo/Heme/Allergies: Negative for polydipsia. Does not bruise/bleed easily.  Psychiatric/Behavioral: Negative for depression. The patient is not nervous/anxious.      Vital Signs: Blood pressure 108/66, pulse (!) 108, temperature 97.9 F (36.6 C), resp. rate 19, height 5\' 8"  (1.727 m), weight 66.7 kg, SpO2 100 %.  Weight trends: Filed Weights   05/30/2019 1944  Weight: 66.7 kg    Physical Exam: General: NAD, resting in bed  Head: Normocephalic, atraumatic.  Eyes: Anicteric, EOMI  Nose: Mucous membranes moist, not inflammed, nonerythematous.  Throat: Oropharynx nonerythematous, no exudate appreciated.   Neck: Supple, trachea midline.  Lungs:  Normal respiratory effort. Clear to auscultation BL without crackles or wheezes.  Heart: RRR. S1 and S2 normal without gallop, murmur, or rubs.  Abdomen:  Bowel sounds present, mild distention  Extremities: No pretibial edema.  Neurologic: A&O X3, Motor strength is 5/5 in the all 4 extremities  Skin: No visible rashes, scars.    Lab results: Basic Metabolic Panel: Recent Labs  Lab 05/28/2019 2018 05/15/2019 2018 05/12/2019 2329 05/16/2019 2329 05/20/19 0521 05/21/19 0631 05/22/19 0518  NA 137   < > 136   < > 136 137 132*  K 2.7*   < >  2.8*   < > 3.4* 4.8 4.6  CL 90*   < > 94*   < > 95* 95* 94*  CO2 33*   < > 28   < > 28 23 26   GLUCOSE 223*   < > 178*   < > 106* 46* 140*  BUN 39*   < > 39*   < > 41* 54* 73*  CREATININE 1.79*   < > 1.74*   < > 1.84* 2.43* 3.32*  CALCIUM 8.4*   < > 8.0*   < > 8.1* 9.0 8.6*  MG 1.6*  --  1.6*  --   --  2.2  --   PHOS  --   --  2.4*  --   --   --   --    < > = values in this interval not displayed.    Liver Function Tests: Recent  Labs  Lab 05/20/19 0521 05/21/19 0631 05/22/19 0518  AST 48* 65* 55*  ALT 27 31 28   ALKPHOS 153* 165* 158*  BILITOT 8.2* 10.4* 9.5*  PROT 7.3 7.7 7.1  ALBUMIN 2.5* 2.6* 2.5*   No results for input(s): LIPASE, AMYLASE in the last 168 hours. Recent Labs  Lab 05-Jun-2019 2019  AMMONIA 25    CBC: Recent Labs  Lab 05-Jun-2019 1716 06-05-19 1716 06-05-19 2018 06-05-2019 2018 06-05-2019 2329 06-05-19 2329 05/20/19 0521 05/21/19 0631 05/22/19 0518  WBC 7.8   < > 6.7   < > 6.9   < > 8.3 11.1* 9.2  NEUTROABS 5.8  --  5.3  --   --   --   --   --   --   HGB 11.9*   < > 11.8*   < > 10.7*   < > 10.7* 11.9* 10.9*  HCT 37.3*   < > 37.9*   < > 33.5*   < > 33.9* 37.5* 34.5*  MCV 66.8*   < > 68.5*  --  67.1*  --  67.9* 67.8* 68.5*  PLT 93*   < > 91*   < > 85*   < > 82* 97* 91*   < > = values in this interval not displayed.    Cardiac Enzymes: No results for input(s): CKTOTAL, CKMB, CKMBINDEX, TROPONINI in the last 168 hours.  BNP: Invalid input(s): POCBNP  CBG: Recent Labs  Lab 05/21/19 0744 05/21/19 0828 05/21/19 0855 05/21/19 1125  GLUCAP 62* 61* 73 87    Microbiology: Results for orders placed or performed during the hospital encounter of 06-05-2019  Blood culture (routine x 2)     Status: None (Preliminary result)   Collection Time: Jun 05, 2019  8:18 PM   Specimen: BLOOD  Result Value Ref Range Status   Specimen Description BLOOD RIGHT FOREARM  Final   Special Requests   Final    BOTTLES DRAWN AEROBIC AND ANAEROBIC Blood Culture adequate volume   Culture   Final    NO GROWTH 3 DAYS Performed at [facility], [address], [place], [address] [place]    Report Status PENDING  Incomplete  Blood culture (routine x 2)     Status: None (Preliminary result)   Collection Time: 2019/06/05  8:18 PM   Specimen: BLOOD  Result Value Ref Range Status   Specimen Description BLOOD RIGHT ANTECUBITAL  Final   Special Requests   Final    BOTTLES DRAWN AEROBIC AND ANAEROBIC Blood  Culture adequate volume   Culture   Final    NO GROWTH  3 DAYS Performed at [facility], [address] [place], [place] [address]    Report Status PENDING  Incomplete  Respiratory Panel by RT PCR (Flu A&B, Covid) - Nasopharyngeal Swab     Status: None   Collection Time: 05/13/2019  8:19 PM   Specimen: Nasopharyngeal Swab  Result Value Ref Range Status   SARS Coronavirus 2 by RT PCR NEGATIVE NEGATIVE Final    Comment: (NOTE) SARS-CoV-2 target nucleic acids are NOT DETECTED. The SARS-CoV-2 RNA is generally detectable in upper respiratoy specimens during the acute phase of infection. The lowest concentration of SARS-CoV-2 viral copies this assay can detect is 131 copies/mL. A negative result does not preclude SARS-Cov-2 infection and should not be used as the sole basis for treatment or other patient management decisions. A negative result may occur with  improper specimen collection/handling, submission of specimen other than nasopharyngeal swab, presence of viral mutation(s) within the areas targeted by this assay, and inadequate number of viral copies (<131 copies/mL). A negative result must be combined with clinical observations, patient history, and epidemiological information. The expected result is Negative. Fact Sheet for Patients:  https://www.moore.com/ Fact Sheet for Healthcare Providers:  https://www.young.biz/ This test is not yet ap proved or cleared by the [place] FDA and  has been authorized for detection and/or diagnosis of SARS-CoV-2 by FDA under an Emergency Use Authorization (EUA). This EUA will remain  in effect (meaning this test can be used) for the duration of the COVID-19 declaration under Section 564(b)(1) of the Act, 21 U.S.C. section 360bbb-3(b)(1), unless the authorization is terminated or revoked sooner.    Influenza A by PCR NEGATIVE NEGATIVE Final   Influenza B by PCR NEGATIVE NEGATIVE Final     Comment: (NOTE) The Xpert Xpress SARS-CoV-2/FLU/RSV assay is intended as an aid in  the diagnosis of influenza from Nasopharyngeal swab specimens and  should not be used as a sole basis for treatment. Nasal washings and  aspirates are unacceptable for Xpert Xpress SARS-CoV-2/FLU/RSV  testing. Fact Sheet for Patients: https://www.moore.com/ Fact Sheet for Healthcare Providers: https://www.young.biz/ This test is not yet approved or cleared by the [place] FDA and  has been authorized for detection and/or diagnosis of SARS-CoV-2 by  FDA under an Emergency Use Authorization (EUA). This EUA will remain  in effect (meaning this test can be used) for the duration of the  Covid-19 declaration under Section 564(b)(1) of the Act, 21  U.S.C. section 360bbb-3(b)(1), unless the authorization is  terminated or revoked. Performed at [facility], [address] [place], [place] [address]     Coagulation Studies: Recent Labs    05/31/2019 1716 05/16/2019 2018 05/21/19 0921  LABPROT 19.1* 19.8* 26.5*  INR 1.6* 1.7* 2.5*    Urinalysis: Recent Labs    05/26/2019 2222  COLORURINE AMBER*  LABSPEC 1.009  PHURINE 6.0  GLUCOSEU NEGATIVE  HGBUR NEGATIVE  BILIRUBINUR NEGATIVE  KETONESUR NEGATIVE  PROTEINUR NEGATIVE  NITRITE NEGATIVE  LEUKOCYTESUR NEGATIVE      Imaging: CT ABDOMEN PELVIS WO CONTRAST  Result Date: 05/21/2019 CLINICAL DATA:  Diffuse abdominal pain with increased right upper quadrant pain. EXAM: CT ABDOMEN AND PELVIS WITHOUT CONTRAST TECHNIQUE: Multidetector CT imaging of the abdomen and pelvis was performed following the standard protocol without IV contrast. COMPARISON:  CT of the abdomen and pelvis without contrast 02/04/2019. Liver ultrasound 02/05/2019 FINDINGS: Lower chest: Bilateral pleural effusions are again seen, right greater than left. Associated airspace disease is present in the right lower lobe. Minimal atelectasis  or scarring is present on left. The heart is enlarged. 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 No significant pericardial effusion is present. Hepatobiliary: Liver enlargement is stable, measuring 1.9 cm cephalo caudad. Liver is diffusely hypodense without focal lesions. There is some nodularity of the border. Gallbladder is contracted. Common bile duct is unremarkable. Pancreas: Unremarkable. No pancreatic ductal dilatation or surrounding inflammatory changes. Spleen: Normal in size without focal abnormality. Adrenals/Urinary Tract: Adrenal glands are normal bilaterally. A single punctate nonobstructing stone is present in the left kidney. No other focal lesions are present. Ureters are normal. The urinary bladder is within normal limits. Stomach/Bowel: Stomach is moderately distended. No mass lesion is present. The small bowel is unremarkable. Terminal ileum is within normal limits. Appendix is visualized and normal. The ascending and transverse colon are within normal limits. Diverticular changes are present in the sigmoid colon without focal inflammation to suggest diverticulitis. Vascular/Lymphatic: Atherosclerotic calcifications are present throughout the aorta and branch vessels. Dense calcifications and probable stenosis are again noted in the proximal left iliac artery. Reproductive: Prostate is unremarkable. Other: Diffuse abdominal ascites is noted. There is fluid about the liver. Extensive subcutaneous edema is present as well. No discrete fluid collection or abscess is present. Musculoskeletal: Sclerotic changes are present at L5-S1. There is no significant change. Slight degenerative anterolisthesis is again noted at L3-4 and L4-5. No focal lytic or blastic lesions are present. Advanced degenerative changes are again noted in the left hip with diffused cystic changes on both Dawson the joint. IMPRESSION: 1. Diffuse abdominal ascites and diffuse subcutaneous edema compatible with anasarca. 2.  Hepatomegaly and diffuse fatty infiltration of the liver without focal lesion. Irregular hepatic border is similar the prior study, suggesting cirrhosis 3. Punctate nonobstructing stone in the left kidney. 4. Sigmoid diverticulosis without diverticulitis. 5. Aortic Atherosclerosis (ICD10-I70.0). Extensive atherosclerotic disease with probable stenosis in the proximal left iliac artery. 6. Bilateral pleural effusions, right greater than left. 7. Right lower lobe airspace disease concerning for pneumonia. Electronically Signed   By: Marin Roberts M.D.   On: 05/21/2019 13:33      Assessment & Plan: Pt is a 59 y.o. male with a PMHx of alcohol abuse, bipolar disorder, congestive heart failure, COPD, hypertension, valvular heart disease, who was admitted to [facility] on 06/05/2019 for evaluation of shortness of breath as well as acute kidney injury.  1.  Acute kidney injury, differential diagnosis between acute tubular necrosis versus hepatorenal syndrome. 2.  Alcoholic hepatitis. 3.  Hyponatremia. 4.  Anemia unspecified.  Plan: Patient noted to have worsening acute kidney injury.  No obstruction noted on recent CT scan.  Agree with albumin but we will intensify the regimen to 25 g IV every 8 hours.  If blood pressure drops a bit we could consider midodrine but hold off for now.  No urgent indication for dialysis at the moment however this is a possibility if renal function continues to deteriorate.  This was discussed in depth with the patient and he is agreeable to this if deemed necessary.  Otherwise continue supportive care of his underlying hepatitis and agree with prednisone to treat alcoholic hepatitis.

## 2019-05-22 NOTE — Progress Notes (Signed)
CBG 110 per NT. Glucometer not synching.

## 2019-05-22 NOTE — Progress Notes (Addendum)
[name], MD [address]  [address]  [place], [place] [address]  Main: [phone]  Fax: [phone] Pager: [phone]   Subjective: No acute events overnight, has been afebrile, mild tachycardia.  Patient reports having bowel movements, denies black stools, rectal bleeding, abdominal pain, nausea or vomiting.  He is tolerating diet well.   Objective: Vital signs in last 24 hours: Vitals:   05/21/19 2242 05/22/19 0505 05/22/19 0812 05/22/19 1352  BP: 97/69 98/64 96/71  108/66  Pulse: (!) 102 (!) 104 (!) 104 (!) 108  Resp:   19   Temp:  97.6 F (36.4 C) 97.9 F (36.6 C)   TempSrc:  Oral    SpO2: 100% 97% 94% 100%  Weight:      Height:       Weight change:   Intake/Output Summary (Last 24 hours) at 05/22/2019 1519 Last data filed at 05/22/2019 1330 Gross per 24 hour  Intake 440 ml  Output 0 ml  Net 440 ml     Exam: Heart:: Regular rate and rhythm, S1S2 present or without murmur or extra heart sounds Lungs: normal and clear to auscultation Abdomen: soft, nontender, normal bowel sounds   Lab Results: CBC Latest Ref Rng & Units 05/22/2019 05/21/2019 05/20/2019  WBC 4.0 - 10.5 K/uL 9.2 11.1(H) 8.3  Hemoglobin 13.0 - 17.0 g/dL 10.9(L) 11.9(L) 10.7(L)  Hematocrit 39.0 - 52.0 % 34.5(L) 37.5(L) 33.9(L)  Platelets 150 - 400 K/uL 91(L) 97(L) 82(L)   CMP Latest Ref Rng & Units 05/22/2019 05/21/2019 05/20/2019  Glucose 70 - 99 mg/dL 07/18/2019) 950(D) 32(I)  BUN 6 - 20 mg/dL 712(W) 58(K) 99(I)  Creatinine 0.61 - 1.24 mg/dL 33(A) 2.50(N) 3.97(Q)  Sodium 135 - 145 mmol/L 132(L) 137 136  Potassium 3.5 - 5.1 mmol/L 4.6 4.8 3.4(L)  Chloride 98 - 111 mmol/L 94(L) 95(L) 95(L)  CO2 22 - 32 mmol/L 26 23 28   Calcium 8.9 - 10.3 mg/dL 7.34(L) 9.0 )  Total Protein 6.5 - 8.1 g/dL 7.1 7.7 7.3  Total Bilirubin 0.3 - 1.2 mg/dL 9.3(X) 10.4(H) 8.2(H)  Alkaline Phos 38 - 126 U/L 158(H) 165(H) 153(H)  AST 15 - 41 U/L 55(H) 65(H) 48(H)  ALT 0 - 44 U/L 28 31 27     Micro  Results: Recent Results (from the past 240 hour(s))  Blood culture (routine x 2)     Status: None (Preliminary result)   Collection Time: 05/16/2019  8:18 PM   Specimen: BLOOD  Result Value Ref Range Status   Specimen Description BLOOD RIGHT FOREARM  Final   Special Requests   Final    BOTTLES DRAWN AEROBIC AND ANAEROBIC Blood Culture adequate volume   Culture   Final    NO GROWTH 3 DAYS Performed at [facility], [address] [place] [address] [place], [facility] [address]    Report Status PENDING  Incomplete  Blood culture (routine x 2)     Status: None (Preliminary result)   Collection Time: 06/04/2019  8:18 PM   Specimen: BLOOD  Result Value Ref Range Status   Specimen Description BLOOD RIGHT ANTECUBITAL  Final   Special Requests   Final    BOTTLES DRAWN AEROBIC AND ANAEROBIC Blood Culture adequate volume   Culture   Final    NO GROWTH 3 DAYS Performed at [facility], [address] [place], [facility] [address]    Report Status PENDING  Incomplete  Respiratory Panel by RT PCR (Flu A&B, Covid) - Nasopharyngeal Swab     Status:  None   Collection Time: 05-21-19  8:19 PM   Specimen: Nasopharyngeal Swab  Result Value Ref Range Status   SARS Coronavirus 2 by RT PCR NEGATIVE NEGATIVE Final    Comment: (NOTE) SARS-CoV-2 target nucleic acids are NOT DETECTED. The SARS-CoV-2 RNA is generally detectable in upper respiratoy specimens during the acute phase of infection. The lowest concentration of SARS-CoV-2 viral copies this assay can detect is 131 copies/mL. A negative result does not preclude SARS-Cov-2 infection and should not be used as the sole basis for treatment or other patient management decisions. A negative result may occur with  improper specimen collection/handling, submission of specimen other than nasopharyngeal swab, presence of viral mutation(s) within the areas targeted by this assay, and inadequate number of viral copies (<131 copies/mL). A negative result must be  combined with clinical observations, patient history, and epidemiological information. The expected result is Negative. Fact Sheet for Patients:  PinkCheek.be Fact Sheet for Healthcare Providers:  GravelBags.it This test is not yet ap proved or cleared by the [place] FDA and  has been authorized for detection and/or diagnosis of SARS-CoV-2 by FDA under an Emergency Use Authorization (EUA). This EUA will remain  in effect (meaning this test can be used) for the duration of the COVID-19 declaration under Section 564(b)(1) of the Act, 21 U.S.C. section 360bbb-3(b)(1), unless the authorization is terminated or revoked sooner.    Influenza A by PCR NEGATIVE NEGATIVE Final   Influenza B by PCR NEGATIVE NEGATIVE Final    Comment: (NOTE) The Xpert Xpress SARS-CoV-2/FLU/RSV assay is intended as an aid in  the diagnosis of influenza from Nasopharyngeal swab specimens and  should not be used as a sole basis for treatment. Nasal washings and  aspirates are unacceptable for Xpert Xpress SARS-CoV-2/FLU/RSV  testing. Fact Sheet for Patients: PinkCheek.be Fact Sheet for Healthcare Providers: GravelBags.it This test is not yet approved or cleared by the [place] FDA and  has been authorized for detection and/or diagnosis of SARS-CoV-2 by  FDA under an Emergency Use Authorization (EUA). This EUA will remain  in effect (meaning this test can be used) for the duration of the  Covid-19 declaration under Section 564(b)(1) of the Act, 21  U.S.C. section 360bbb-3(b)(1), unless the authorization is  terminated or revoked. Performed at [facility], [address] [place], [place] [address]    Studies/Results: CT ABDOMEN PELVIS WO CONTRAST  Result Date: 05/21/2019 CLINICAL DATA:  Diffuse abdominal pain with increased right upper quadrant pain. EXAM: CT ABDOMEN AND  PELVIS WITHOUT CONTRAST TECHNIQUE: Multidetector CT imaging of the abdomen and pelvis was performed following the standard protocol without IV contrast. COMPARISON:  CT of the abdomen and pelvis without contrast 02/04/2019. Liver ultrasound 02/05/2019 FINDINGS: Lower chest: Bilateral pleural effusions are again seen, right greater than left. Associated airspace disease is present in the right lower lobe. Minimal atelectasis or scarring is present on left. The heart is enlarged. 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 No significant pericardial effusion is present. Hepatobiliary: Liver enlargement is stable, measuring 1.9 cm cephalo caudad. Liver is diffusely hypodense without focal lesions. There is some nodularity of the border. Gallbladder is contracted. Common bile duct is unremarkable. Pancreas: Unremarkable. No pancreatic ductal dilatation or surrounding inflammatory changes. Spleen: Normal in size without focal abnormality. Adrenals/Urinary Tract: Adrenal glands are normal bilaterally. A single punctate nonobstructing stone is present in the left kidney. No other focal lesions are present. Ureters are normal. The urinary bladder is within normal limits. Stomach/Bowel: Stomach  is moderately distended. No mass lesion is present. The small bowel is unremarkable. Terminal ileum is within normal limits. Appendix is visualized and normal. The ascending and transverse colon are within normal limits. Diverticular changes are present in the sigmoid colon without focal inflammation to suggest diverticulitis. Vascular/Lymphatic: Atherosclerotic calcifications are present throughout the aorta and branch vessels. Dense calcifications and probable stenosis are again noted in the proximal left iliac artery. Reproductive: Prostate is unremarkable. Other: Diffuse abdominal ascites is noted. There is fluid about the liver. Extensive subcutaneous edema is present as well. No discrete fluid collection or abscess is  present. Musculoskeletal: Sclerotic changes are present at L5-S1. There is no significant change. Slight degenerative anterolisthesis is again noted at L3-4 and L4-5. No focal lytic or blastic lesions are present. Advanced degenerative changes are again noted in the left hip with diffused cystic changes on both sides the joint. IMPRESSION: 1. Diffuse abdominal ascites and diffuse subcutaneous edema compatible with anasarca. 2. Hepatomegaly and diffuse fatty infiltration of the liver without focal lesion. Irregular hepatic border is similar the prior study, suggesting cirrhosis 3. Punctate nonobstructing stone in the left kidney. 4. Sigmoid diverticulosis without diverticulitis. 5. Aortic Atherosclerosis (ICD10-I70.0). Extensive atherosclerotic disease with probable stenosis in the proximal left iliac artery. 6. Bilateral pleural effusions, right greater than left. 7. Right lower lobe airspace disease concerning for pneumonia. Electronically Signed   By: Marin Roberts M.D.   On: 05/21/2019 13:33   Medications:  I have reviewed the patient's current medications. Prior to Admission:  Medications Prior to Admission  Medication Sig Dispense Refill Last Dose  . acetaminophen (TYLENOL) 500 MG tablet Take 1,000 mg by mouth every 8 (eight) hours.   05/18/2019 at Unknown time  . albuterol (VENTOLIN HFA) 108 (90 Base) MCG/ACT inhaler Inhale 2 puffs into the lungs every 4 (four) hours as needed for wheezing or shortness of breath. 6.7 g 0 prn at prn  . aspirin EC 81 MG tablet Take 81 mg by mouth daily.   05/21/2019 at Unknown time  . atorvastatin (LIPITOR) 40 MG tablet Take 40 mg by mouth daily.   06/02/2019 at Unknown time  . DULoxetine (CYMBALTA) 30 MG capsule Take 30 mg by mouth daily.   06/02/2019 at Unknown time  . FLUoxetine (PROZAC) 20 MG capsule Take 20 mg by mouth every morning.   06/06/2019 at Unknown time  . gabapentin (NEURONTIN) 300 MG capsule Take 1 capsule (300 mg total) by mouth 3 (three) times  daily. 90 capsule 3 05/12/2019 at Unknown time  . ipratropium (ATROVENT HFA) 17 MCG/ACT inhaler Inhale 2 puffs into the lungs 4 (four) times daily. 1 Inhaler 5 05/16/2019 at Unknown time  . meloxicam (MOBIC) 15 MG tablet Take 15 mg by mouth daily.   05/30/2019 at Unknown time  . mirtazapine (REMERON) 15 MG tablet Take 15 mg by mouth at bedtime.   05/18/2019 at Unknown time  . naproxen (NAPROSYN) 500 MG tablet Take 500 mg by mouth 2 (two) times daily with a meal.   06/02/2019 at Unknown time  . potassium chloride 20 MEQ TBCR Take 20 mEq by mouth daily. 30 tablet 5 05/30/2019 at Unknown time  . torsemide (DEMADEX) 20 MG tablet Take 3 tablets (60 mg total) by mouth daily. 90 tablet 3 06/03/2019 at Unknown time  . metoprolol succinate (TOPROL-XL) 25 MG 24 hr tablet Take 0.5 tablets (12.5 mg total) by mouth daily. 45 tablet 2    Scheduled: . aspirin EC  81 mg Oral Daily  .  atorvastatin  40 mg Oral q1800  . azithromycin  500 mg Oral Daily  . feeding supplement (ENSURE ENLIVE)  237 mL Oral TID BM  . FLUoxetine  20 mg Oral Daily  . ipratropium-albuterol  3 mL Nebulization TID  . magnesium oxide  400 mg Oral BID  . Melatonin  2.5 mg Oral QHS  . mirtazapine  15 mg Oral QHS  . multivitamin with minerals  1 tablet Oral Daily  . pantoprazole  40 mg Oral Daily  . predniSONE  40 mg Oral QAC breakfast   Continuous: . albumin human    . cefTRIAXone (ROCEPHIN)  IV 1 g (05/21/19 2105)   [id number], metoprolol tartrate, ondansetron **OR** ondansetron (ZOFRAN) IV Anti-infectives (From admission, onward)   Start     Dose/Rate Route Frequency Ordered Stop   05/20/19 2000  azithromycin (ZITHROMAX) tablet 500 mg     500 mg Oral Daily 05/20/19 0818 06/03/2019 1959   05/20/19 2000  cefTRIAXone (ROCEPHIN) 1 g in sodium chloride 0.9 % 100 mL IVPB     1 g 200 mL/hr over 30 Minutes Intravenous Every 24 hours 05/20/19 0818 05/30/2019 1959   06-13-2019 2230  cefTRIAXone (ROCEPHIN) 1 g in sodium chloride 0.9 % 100 mL IVPB       1 g 200 mL/hr over 30 Minutes Intravenous  Once June 13, 2019 2218 05/20/19 0409   06/13/19 2230  azithromycin (ZITHROMAX) tablet 500 mg     500 mg Oral  Once 06/13/19 2218 Jun 13, 2019 2312     Scheduled Meds: . aspirin EC  81 mg Oral Daily  . atorvastatin  40 mg Oral q1800  . azithromycin  500 mg Oral Daily  . feeding supplement (ENSURE ENLIVE)  237 mL Oral TID BM  . FLUoxetine  20 mg Oral Daily  . ipratropium-albuterol  3 mL Nebulization TID  . magnesium oxide  400 mg Oral BID  . Melatonin  2.5 mg Oral QHS  . mirtazapine  15 mg Oral QHS  . multivitamin with minerals  1 tablet Oral Daily  . pantoprazole  40 mg Oral Daily  . predniSONE  40 mg Oral QAC breakfast   Continuous Infusions: . albumin human    . cefTRIAXone (ROCEPHIN)  IV 1 g (05/21/19 2105)   PRN Meds:.albuterol, metoprolol tartrate, ondansetron **OR** ondansetron (ZOFRAN) IV   Assessment: Principal Problem:   Hypokalemia Active Problems:   Pulmonary hypertension (HCC)   COPD (chronic obstructive pulmonary disease) (HCC)   Bipolar 1 disorder (HCC)   AKI (acute kidney injury) (HCC)   Hypomagnesemia   Alcoholic hepatitis with ascites   Hyperglycemia   Acute liver failure without hepatic coma   Goals of care, counseling/discussion   Palliative care by specialist   DNR (do not resuscitate) discussion  Plan: Acute on chronic alcoholic hepatitis Patient is started on prednisone on 05/22/2019, need to calculate Lille's score on day 5 to see if patient is responding to steroids in order to continue further LFTs are slowly improving High-protein diet, multivitamin plus thiamine and folate daily Vitamin K is not administered as patient has atrial flutter Complete abstinence from alcohol use Due to recurrent hospitalizations and worsening liver function, patient not willing to quit alcohol and ongoing polysubstance abuse, he will be appropriate candidate for palliative care, palliative care is already consulted.  He is not  a candidate for liver transplant  Acute kidney injury: In setting of alcoholic hepatitis AKI or HRS, worsening renal function since admission Recommend to start IV albumin 3 times a day  Nephrology has been consulted Recommend midodrine and octreotide  Overall prognosis is guarded, with 30-day mortality greater than 50% if patient continues to drink alcohol and not respond to prednisone    LOS: 3 days   Caitriona Sundquist 05/22/2019, 3:19 PM

## 2019-05-22 NOTE — Progress Notes (Signed)
Morning CBG 91 per NT.

## 2019-05-22 NOTE — Progress Notes (Signed)
PROGRESS NOTE    [name]  XIP:[id number] DOB: 1961/01/24 DOA: 05/28/2019 PCP: [facility], [facility]      Brief Narrative:  [name] is a 59 y.o. 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  Went to see his Cardiologist prior to admission, was tachypneic, tachycardic and had hypokalemia and fluid overload, sent to ER for diuresis.  In the ER, hypertensive, pulse 114, WBC 6K, platelets 91K, creatinine 1.8, procalcitonin 0.39, lactate 3, Covid negative, chest x-ray with pneumonia versus edema with small effusions.  Started on empiric antibiotics and admitted.      Assessment & Plan:   Decompensated alcoholic hep C cirrhosis Ongoing alcohol use, variable reports, per GI, 80-120 oz beer daily. INR 2.5, Tbili stable ~9, albumin 2.5, Cr trending up 2.4 today.  MELD >30.  Discriminant >40.  Cr worsening.  Moderate ascites on exam and by CT imaging.    -Consult GI re: steroids, liver failure, risk of anticoagulation and risk of TEE  -Consult Nephrology re: possible HRS, utility of splanchnic vasoconstrictors  -Low-sodium diet, fluid restriction     Acute on chronic systolic and diastolic CHF History of aortic valve repair, twice (1990, 2000) History of mitral valve repair (1990, now with moderate mitral regurgitation and stenosis) Admitted with BNP >1500, effusions on exam.  Per PCP notes, dry weight 145 pounds, but clearly fluid overloaded on exam due to CHF and liver failure.  Diuretics started intiially, have been subsequently held due to worsening renal function.  Baseline creatinine 1.2. EF 40 to 45% last October.  -Hold Lasix -Consult Cardiology, appreciate cares -Strict I/Os, daily weights, telemetry  -Daily monitoring renal function -Lisinopril, spironolactone  contraindicated -Continue atorvastatin, aspirin   Acute renal failure Cr baseline 1.2.  Increased to 2.4 yesterday and >3 today.  -Consult Nephrology as above  Atrial flutter, typical New onset.  Minimal improvement in Rate with metoprolol.  Agree with deferring anticoagulation in setting of rising INR.    -Defer metoprolol to Cardiology   Abdominal pain CT abdomen and pelvis unremarkable.  SBO, gallbladder disease, liver mass ruled out.  Possible pneumonia Procalcitonin 0.38, pneumonia possible by chest x-ray. -Continue empiric azithromycin and ceftriaxone, day 4 of 5  Hemoptysis, chest pain Patient able to state clearly this was coughed, not vomited red blood last week.  No hemoptysis since.  CT chest shows small effusion only.  Hypokalemia Hypomagnesemia Now replete  Hypotension Chronic, in the context of cirrhosis.  COPD No wheezing to suggest active bronchospasm -Continue home LABA/LAMA   Alcohol use No real symptoms of withdrawal at present.  Oky to stop CIWA scoring. -Continue thiamine and folate  Thrombocytopenia Stable relative to baseline  GERD -Continue pantoprazole   Anemia of chronic disease Hemoglobin stable, no clinical bleeding here.   Depression -Continue home fluoxetine  Moderate protein calorie malnutrition As evidenced by severe chronic disease, poor oral intake here in the hospital, and diffuse loss of subcutaneous muscle mass and fat -Consult nutrition         Disposition: The patient was admitted with dyspnea, orthopnea, and malaise, found to have combined heart and liver failure.  He now has worsening renal failure, and his prognosis is worsening.  We will continue palliative care discussions, consult nephrology regarding hepatorenal syndrome suspected.           MDM:  The below labs and imaging reports reviewed and summarized above.  Medication management as above.  The patient has severe worsening multiorgan  failure.   DVT prophylaxis: SCDs Code Status: Full code Family Communication: Patient deferred    Consultants:   Cardiology  GI  Paliative Care  Nephrology   Procedures:   2/10 chest x-ray-right lower lobe opacity, possible pneumonia  2/11 CT chest-effusion, no pneumonia or mass  2/12 CT abdomen -- ascites  Antimicrobials:   Ceftriaxone and azithromycin 2/11>>  Culture data:   2/10 blood culture x2-no growth           Subjective: Patient's appetite is poor, he is nauseated, has generalized malaise.  No hematemesis, melena.  He is swollen and slightly dyspneic, but no worsening.  No fever or confusion.  No change in abdominal pain.      Objective: Vitals:   05/21/19 2242 05/22/19 0505 05/22/19 0812 05/22/19 1352  BP: 97/69 98/64 96/71  108/66  Pulse: (!) 102 (!) 104 (!) 104 (!) 108  Resp:   19   Temp:  97.6 F (36.4 C) 97.9 F (36.6 C)   TempSrc:  Oral    SpO2: 100% 97% 94% 100%  Weight:      Height:        Intake/Output Summary (Last 24 hours) at 05/22/2019 1415 Last data filed at 05/22/2019 1330 Gross per 24 hour  Intake 440 ml  Output 0 ml  Net 440 ml   Filed Weights   05/15/2019 1944  Weight: 66.7 kg    Examination: General appearance: Cachectic adult male, alert and in no acute distress.  Appears tired. HEENT: Anicteric, conjunctiva pink, lids and lashes normal. No nasal deformity, discharge, epistaxis.  Lips moist, partially edentulous, oropharynx tacky dry, no oral lesions, hearing normal   Skin: Warm and dry.  No suspicious rashes or lesions. Cardiac: Tachycardic, regular, systolic murmur, radiating to the axilla, lower extremity edema    Respiratory: Tachypneic, lung sounds diminished bilaterally, no rales or wheezing Abdomen: Abdomen soft.  Ascites noted, moderate right upper quadrant pain, no rigidity or rebound, hepatosplenomegaly noted  MSK: No deformities or effusions of the large joints of the upper or lower extremities  bilaterally.  Diffuse loss of subcutaneous muscle mass and fat Neuro: Awake and alert. Naming is grossly intact, and the patient's recall, recent and remote, as well as general fund of knowledge seem within normal limits.  Muscle tone normal, without fasciculations.  Moves all extremities equally and with normal coordination.  Speech fluent.    Psych: Sensorium intact and responding to questions, attention normal. Affect blunted.  Judgment and insight appear normal.        Data Reviewed: I have personally reviewed following labs and imaging studies:  CBC: Recent Labs  Lab 05/31/2019 1716 05/29/2019 1716 06/01/2019 2018 06/01/2019 2329 05/20/19 0521 05/21/19 0631 05/22/19 0518  WBC 7.8   < > 6.7 6.9 8.3 11.1* 9.2  NEUTROABS 5.8  --  5.3  --   --   --   --   HGB 11.9*   < > 11.8* 10.7* 10.7* 11.9* 10.9*  HCT 37.3*   < > 37.9* 33.5* 33.9* 37.5* 34.5*  MCV 66.8*   < > 68.5* 67.1* 67.9* 67.8* 68.5*  PLT 93*   < > 91* 85* 82* 97* 91*   < > = values in this interval not displayed.   Basic Metabolic Panel: Recent Labs  Lab 05/10/2019 2018 05/12/2019 2329 05/20/19 0521 05/21/19 0631 05/22/19 0518  NA  137 136 136 137 132*  K 2.7* 2.8* 3.4* 4.8 4.6  CL 90* 94* 95* 95* 94*  CO2 33* 28 28 23 26   GLUCOSE 223* 178* 106* 46* 140*  BUN 39* 39* 41* 54* 73*  CREATININE 1.79* 1.74* 1.84* 2.43* 3.32*  CALCIUM 8.4* 8.0* 8.1* 9.0 8.6*  MG 1.6* 1.6*  --  2.2  --   PHOS  --  2.4*  --   --   --    GFR: Estimated Creatinine Clearance: 22.6 mL/min (A) (by C-G formula based on SCr of 3.32 mg/dL (H)). Liver Function Tests: Recent Labs  Lab 05-28-19 2018 May 28, 2019 2329 05/20/19 0521 05/21/19 0631 05/22/19 0518  AST 60* 52* 48* 65* 55*  ALT 33 27 27 31 28   ALKPHOS 186* 156* 153* 165* 158*  BILITOT 10.9* 8.9* 8.2* 10.4* 9.5*  PROT 8.2* 7.2 7.3 7.7 7.1  ALBUMIN 2.8* 2.4* 2.5* 2.6* 2.5*   No results for input(s): LIPASE, AMYLASE in the last 168 hours. Recent Labs  Lab May 28, 2019 2019  AMMONIA 25    Coagulation Profile: Recent Labs  Lab 28-May-2019 1716 28-May-2019 2018 05/21/19 0921  INR 1.6* 1.7* 2.5*   Cardiac Enzymes: No results for input(s): CKTOTAL, CKMB, CKMBINDEX, TROPONINI in the last 168 hours. BNP (last 3 results) No results for input(s): PROBNP in the last 8760 hours. HbA1C: Recent Labs    05/28/2019 2329  HGBA1C 6.4*   CBG: Recent Labs  Lab 05/21/19 0744 05/21/19 0828 05/21/19 0855 05/21/19 1125  GLUCAP 62* 61* 73 87   Lipid Profile: No results for input(s): CHOL, HDL, LDLCALC, TRIG, CHOLHDL, LDLDIRECT in the last 72 hours. Thyroid Function Tests: Recent Labs    2019/05/28 1716  TSH 6.834*   Anemia Panel: No results for input(s): VITAMINB12, FOLATE, FERRITIN, TIBC, IRON, RETICCTPCT in the last 72 hours. Urine analysis:    Component Value Date/Time   COLORURINE AMBER (A) 05-28-2019 2222   APPEARANCEUR CLEAR (A) May 28, 2019 2222   APPEARANCEUR Clear 12/25/2013 0243   LABSPEC 1.009 May 28, 2019 2222   LABSPEC 1.002 12/25/2013 0243   PHURINE 6.0 May 28, 2019 2222   GLUCOSEU NEGATIVE 05-28-19 2222   GLUCOSEU Negative 12/25/2013 0243   HGBUR NEGATIVE May 28, 2019 2222   BILIRUBINUR NEGATIVE 05-28-2019 2222   BILIRUBINUR Negative 12/25/2013 0243   KETONESUR NEGATIVE 05-28-19 2222   PROTEINUR NEGATIVE 05-28-2019 2222   NITRITE NEGATIVE 05-28-2019 2222   LEUKOCYTESUR NEGATIVE 2019-05-28 2222   LEUKOCYTESUR Negative 12/25/2013 0243   Sepsis Labs: @LABRCNTIP (procalcitonin:4,lacticacidven:4)  ) Recent Results (from the past 240 hour(s))  Blood culture (routine x 2)     Status: None (Preliminary result)   Collection Time: 2019/05/28  8:18 PM   Specimen: BLOOD  Result Value Ref Range Status   Specimen Description BLOOD RIGHT FOREARM  Final   Special Requests   Final    BOTTLES DRAWN AEROBIC AND ANAEROBIC Blood Culture adequate volume   Culture   Final    NO GROWTH 3 DAYS Performed at [facility], [address] [place], [facility] [address]     Report Status PENDING  Incomplete  Blood culture (routine x 2)     Status: None (Preliminary result)   Collection Time: 2019/05/28  8:18 PM   Specimen: BLOOD  Result Value Ref Range Status   Specimen Description BLOOD RIGHT ANTECUBITAL  Final   Special Requests   Final    BOTTLES DRAWN AEROBIC AND ANAEROBIC Blood Culture adequate volume   Culture   Final    NO GROWTH 3  DAYS Performed at [facility], [address] [place], [place] [address]    Report Status PENDING  Incomplete  Respiratory Panel by RT PCR (Flu A&B, Covid) - Nasopharyngeal Swab     Status: None   Collection Time: June 14, 2019  8:19 PM   Specimen: Nasopharyngeal Swab  Result Value Ref Range Status   SARS Coronavirus 2 by RT PCR NEGATIVE NEGATIVE Final    Comment: (NOTE) SARS-CoV-2 target nucleic acids are NOT DETECTED. The SARS-CoV-2 RNA is generally detectable in upper respiratoy specimens during the acute phase of infection. The lowest concentration of SARS-CoV-2 viral copies this assay can detect is 131 copies/mL. A negative result does not preclude SARS-Cov-2 infection and should not be used as the sole basis for treatment or other patient management decisions. A negative result may occur with  improper specimen collection/handling, submission of specimen other than nasopharyngeal swab, presence of viral mutation(s) within the areas targeted by this assay, and inadequate number of viral copies (<131 copies/mL). A negative result must be combined with clinical observations, patient history, and epidemiological information. The expected result is Negative. Fact Sheet for Patients:  https://www.moore.com/ Fact Sheet for Healthcare Providers:  https://www.young.biz/ This test is not yet ap proved or cleared by the [place] FDA and  has been authorized for detection and/or diagnosis of SARS-CoV-2 by FDA under an Emergency Use Authorization (EUA). This EUA will  remain  in effect (meaning this test can be used) for the duration of the COVID-19 declaration under Section 564(b)(1) of the Act, 21 U.S.C. section 360bbb-3(b)(1), unless the authorization is terminated or revoked sooner.    Influenza A by PCR NEGATIVE NEGATIVE Final   Influenza B by PCR NEGATIVE NEGATIVE Final    Comment: (NOTE) The Xpert Xpress SARS-CoV-2/FLU/RSV assay is intended as an aid in  the diagnosis of influenza from Nasopharyngeal swab specimens and  should not be used as a sole basis for treatment. Nasal washings and  aspirates are unacceptable for Xpert Xpress SARS-CoV-2/FLU/RSV  testing. Fact Sheet for Patients: https://www.moore.com/ Fact Sheet for Healthcare Providers: https://www.young.biz/ This test is not yet approved or cleared by the [place] FDA and  has been authorized for detection and/or diagnosis of SARS-CoV-2 by  FDA under an Emergency Use Authorization (EUA). This EUA will remain  in effect (meaning this test can be used) for the duration of the  Covid-19 declaration under Section 564(b)(1) of the Act, 21  U.S.C. section 360bbb-3(b)(1), unless the authorization is  terminated or revoked. Performed at [facility], [address] [place], [place] [address]          Radiology Studies: CT ABDOMEN PELVIS WO CONTRAST  Result Date: 05/21/2019 CLINICAL DATA:  Diffuse abdominal pain with increased right upper quadrant pain. EXAM: CT ABDOMEN AND PELVIS WITHOUT CONTRAST TECHNIQUE: Multidetector CT imaging of the abdomen and pelvis was performed following the standard protocol without IV contrast. COMPARISON:  CT of the abdomen and pelvis without contrast 02/04/2019. Liver ultrasound 02/05/2019 FINDINGS: Lower chest: Bilateral pleural effusions are again seen, right greater than left. Associated airspace disease is present in the right lower lobe. Minimal atelectasis or scarring is present on left. The heart  is enlarged. 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 No significant pericardial effusion is present. Hepatobiliary: Liver enlargement is stable, measuring 1.9 cm cephalo caudad. Liver is diffusely hypodense without focal lesions. There is some nodularity of the border. Gallbladder is contracted. Common bile duct is unremarkable. Pancreas: Unremarkable. No pancreatic ductal dilatation or surrounding  inflammatory changes. Spleen: Normal in size without focal abnormality. Adrenals/Urinary Tract: Adrenal glands are normal bilaterally. A single punctate nonobstructing stone is present in the left kidney. No other focal lesions are present. Ureters are normal. The urinary bladder is within normal limits. Stomach/Bowel: Stomach is moderately distended. No mass lesion is present. The small bowel is unremarkable. Terminal ileum is within normal limits. Appendix is visualized and normal. The ascending and transverse colon are within normal limits. Diverticular changes are present in the sigmoid colon without focal inflammation to suggest diverticulitis. Vascular/Lymphatic: Atherosclerotic calcifications are present throughout the aorta and branch vessels. Dense calcifications and probable stenosis are again noted in the proximal left iliac artery. Reproductive: Prostate is unremarkable. Other: Diffuse abdominal ascites is noted. There is fluid about the liver. Extensive subcutaneous edema is present as well. No discrete fluid collection or abscess is present. Musculoskeletal: Sclerotic changes are present at L5-S1. There is no significant change. Slight degenerative anterolisthesis is again noted at L3-4 and L4-5. No focal lytic or blastic lesions are present. Advanced degenerative changes are again noted in the left hip with diffused cystic changes on both sides the joint. IMPRESSION: 1. Diffuse abdominal ascites and diffuse subcutaneous edema compatible with anasarca. 2. Hepatomegaly and diffuse fatty infiltration  of the liver without focal lesion. Irregular hepatic border is similar the prior study, suggesting cirrhosis 3. Punctate nonobstructing stone in the left kidney. 4. Sigmoid diverticulosis without diverticulitis. 5. Aortic Atherosclerosis (ICD10-I70.0). Extensive atherosclerotic disease with probable stenosis in the proximal left iliac artery. 6. Bilateral pleural effusions, right greater than left. 7. Right lower lobe airspace disease concerning for pneumonia. Electronically Signed   By: Marin Roberts M.D.   On: 05/21/2019 13:33        Scheduled Meds: . aspirin EC  81 mg Oral Daily  . atorvastatin  40 mg Oral q1800  . azithromycin  500 mg Oral Daily  . feeding supplement (ENSURE ENLIVE)  237 mL Oral TID BM  . FLUoxetine  20 mg Oral Daily  . ipratropium-albuterol  3 mL Nebulization TID  . magnesium oxide  400 mg Oral BID  . Melatonin  2.5 mg Oral QHS  . mirtazapine  15 mg Oral QHS  . multivitamin with minerals  1 tablet Oral Daily  . pantoprazole  40 mg Oral Daily  . predniSONE  40 mg Oral QAC breakfast   Continuous Infusions: . cefTRIAXone (ROCEPHIN)  IV 1 g (05/21/19 2105)     LOS: 3 days    Time spent: 35 minutes    Alberteen Sam, MD [facility] 05/22/2019, 2:15 PM     Please page though AMION or Epic secure chat:  For [employer], Higher education careers adviser

## 2019-05-22 NOTE — Progress Notes (Addendum)
Cliffton Asters APP on call notified of MEWS score intermittently 3 (yellow) to 5 (red) to 5; based on HR (111-118)  BP 97/77-115/69 and RR 24-28. Pt does appear dyspneic, but denies a change in his perception of SOB; states SOB has improved since admission. O2 sat 96-100 % on RA. Pt also had episode of HR to 180 for short run of 14 beats;  B. Jon Billings made aware. No orders received at this time.   Per B. Jon Billings, "he has acute on chronic alcoholic liver chirrhosis, acute on chronic systolic and diastolic heart fialure, This is the reason for all the changes in his vital signs. They started rocephin in case of pneumonia but xray shows effusions likely fluid reactive from liver and heart failure as well as renal failure. He has no other evidence of infection. He is a DBNR anf they have sought palliative care consult."  There are no orders for DNR; BJon Billings made aware; Will CTM.

## 2019-05-22 NOTE — Progress Notes (Signed)
Progress Note  Patient Name: [name] Date of Encounter: 05/22/2019  Primary Cardiologist: End  Subjective   Somnolent and weak this morning. SOB about the same. No appetite this morning. No angina or palpitations.   LFT slightly improved today.  Albumin remains low at 2.5.  Anemia and thrombocytopenia stable.  Renal function continues to worsen trending with BUN/SCr 54/2.43-73/3.32 with a baseline of approximately 1.2.  Sodium 132, potassium 4.6.  Inpatient Medications    Scheduled Meds: . aspirin EC  81 mg Oral Daily  . atorvastatin  40 mg Oral q1800  . azithromycin  500 mg Oral Daily  . feeding supplement (ENSURE ENLIVE)  237 mL Oral TID BM  . FLUoxetine  20 mg Oral Daily  . ipratropium-albuterol  3 mL Nebulization TID  . magnesium oxide  400 mg Oral BID  . mirtazapine  15 mg Oral QHS  . multivitamin with minerals  1 tablet Oral Daily  . pantoprazole  40 mg Oral Daily  . predniSONE  40 mg Oral QAC breakfast   Continuous Infusions: . cefTRIAXone (ROCEPHIN)  IV 1 g (05/21/19 2105)   PRN Meds: albuterol, metoprolol tartrate, ondansetron **OR** ondansetron (ZOFRAN) IV   Vital Signs    Vitals:   05/21/19 1919 05/21/19 2242 05/22/19 0505 05/22/19 0812  BP: 102/66 97/69 98/64  96/71  Pulse: (!) 102 (!) 102 (!) 104 (!) 104  Resp:    19  Temp: (!) 97.5 F (36.4 C)  97.6 F (36.4 C) 97.9 F (36.6 C)  TempSrc: Oral  Oral   SpO2: 99% 100% 97% 94%  Weight:      Height:        Intake/Output Summary (Last 24 hours) at 05/22/2019 0950 Last data filed at 05/22/2019 4259 Gross per 24 hour  Intake 200 ml  Output 0 ml  Net 200 ml   Filed Weights   05/18/2019 1944  Weight: 66.7 kg    Telemetry    Atrial flutter with ventricular rates in the 90s to low 100s bpm - Personally Reviewed  ECG    No new tracings- Personally Reviewed  Physical Exam   GEN: No acute distress.   Neck: JVD unable to to assessed. Cardiac: RRR, III/VI systolic murmur R/LSB, no rubs,  or gallops.  Respiratory: Clear to auscultation bilaterally.  GI: Soft, nontender, non-distended.   MS: No edema; No deformity. Neuro:  Alert and oriented x 3; Nonfocal.  Psych: Normal affect.  Labs    Chemistry Recent Labs  Lab 05/20/19 0521 05/21/19 0631 05/22/19 0518  NA 136 137 132*  K 3.4* 4.8 4.6  CL 95* 95* 94*  CO2 28 23 26   GLUCOSE 106* 46* 140*  BUN 41* 54* 73*  CREATININE 1.84* 2.43* 3.32*  CALCIUM 8.1* 9.0 8.6*  PROT 7.3 7.7 7.1  ALBUMIN 2.5* 2.6* 2.5*  AST 48* 65* 55*  ALT 27 31 28   ALKPHOS 153* 165* 158*  BILITOT 8.2* 10.4* 9.5*  GFRNONAA 39* 28* 19*  GFRAA 45* 32* 22*  ANIONGAP 13 19* 12     Hematology Recent Labs  Lab 05/20/19 0521 05/21/19 0631 05/22/19 0518  WBC 8.3 11.1* 9.2  RBC 4.99 5.53 5.04  HGB 10.7* 11.9* 10.9*  HCT 33.9* 37.5* 34.5*  MCV 67.9* 67.8* 68.5*  MCH 21.4* 21.5* 21.6*  MCHC 31.6 31.7 31.6  RDW 24.6* 25.9* 25.3*  PLT 82* 97* 91*    Cardiac EnzymesNo results for input(s): TROPONINI in the last 168 hours. No results for input(s):  TROPIPOC in the last 168 hours.   BNP Recent Labs  Lab 05/20/2019 1716  BNP 1,566.0*     DDimer No results for input(s): DDIMER in the last 168 hours.   Radiology    CT ABDOMEN PELVIS WO CONTRAST  Result Date: 05/21/2019 IMPRESSION: 1. Diffuse abdominal ascites and diffuse subcutaneous edema compatible with anasarca. 2. Hepatomegaly and diffuse fatty infiltration of the liver without focal lesion. Irregular hepatic border is similar the prior study, suggesting cirrhosis 3. Punctate nonobstructing stone in the left kidney. 4. Sigmoid diverticulosis without diverticulitis. 5. Aortic Atherosclerosis (ICD10-I70.0). Extensive atherosclerotic disease with probable stenosis in the proximal left iliac artery. 6. Bilateral pleural effusions, right greater than left. 7. Right lower lobe airspace disease concerning for pneumonia. Electronically Signed   By: Marin Roberts M.D.   On: 05/21/2019 13:33     CT CHEST WO CONTRAST  Result Date: 05/20/2019 IMPRESSION: 1. Right pleural effusion associated with dependent lower lobe atelectasis. 2. No evidence of pneumonia.  No pulmonary edema. 3. Mild right upper lobe paraseptal emphysema. 4. Mild enlargement the heart with changes from previous cardiac surgery and aortic and mitral valve replacements. Aortic atherosclerosis. Mild left coronary artery atherosclerosis. 5. Small amount of ascites in the visualized upper abdomen. Aortic Atherosclerosis (ICD10-I70.0) and Emphysema (ICD10-J43.9). Electronically Signed   By: Amie Portland M.D.   On: 05/20/2019 12:38    Cardiac Studies   2D echo 01/2019: 1. Left ventricular ejection fraction, by visual estimation, is 40 to 45%. The left ventricle has mildly decreased function. Left ventricular septal wall thickness was normal. Normal left ventricular posterior wall thickness. There is no left  ventricular hypertrophy.  2. Abnormal septal motion consistent with left bundle branch block.  3. Idiopathic cardiomyopathy.  4. Moderately dilated left ventricular internal cavity size.  5. Global right ventricle has moderately reduced systolic function.The right ventricular size is moderately enlarged. No increase in right ventricular wall thickness.  6. Left atrial size was moderately dilated.  7. Right atrial size was moderately dilated.  8. The mitral valve is degenerative. Moderate to severe mitral valve regurgitation. Mild mitral stenosis.  9. The tricuspid valve is grossly normal. Tricuspid valve regurgitation moderate-severe.  10. The aortic valve is grossly normal. Aortic valve regurgitation is mild. Mild to moderate aortic valve sclerosis/calcification without any evidence of aortic stenosis.  11. The pulmonic valve was grossly normal. Pulmonic valve regurgitation is mild.  12. Moderately elevated pulmonary artery systolic pressure.  13. Mild ant Akinesis.  14. IVCD.   Patient Profile     59  y.o. 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 Maryfrances Bunnell.  Assessment & Plan    1. New onset atrial flutter with RVR: -Overall, this is a very difficult situation -His tachycardic rates have likely precipitated some degree of his dyspnea and volume overload though I also suspect some of his volume is related to third spacing secondary to hypoalbuminemia -He has not a good anticoagulation candidate secondary to recently reported hemoptysis/hematemesis, thrombocytopenia, elevated INR in the setting of underlying liver disease with ongoing alcohol use, as well as potential for esophageal varices -Not a good candidate for digoxin secondary to ARF  -TEE guided cardioversion is not ideal given ongoing cocaine  abuse and underlying comorbid conditions as detailed. This would commit him to anticoagulation for a minimum of 4 weeks afterwards in the setting of the above significant comorbid conditions with high likelihood of recurrent arrhythmia secondary to ongoing alcohol abuse with malnutrition and likely recurrent electrolyte derangements -In this setting, rate control strategy has been recommended -Fortunately, his rates have been reasonably controlled in the 90s to low 100s bpm -Change metoprolol to prn dosing for ventricular rates in the 110s bpm  2.Chronic combined systolic and diastolic CHF, NYHA class IV: -Likely exacerbated by new onset atrial flutter with RVR with some degree of third spacing in the setting of hypoalbuminemia as well as acute renal failure -IV Lasix held secondary to worsening renal function -Continue low-dose metoprolol as above (patient has not  received any within the past 24 to 36 hours) as outlined above with hold parameters  -Not currently a candidate for further GDMT including ACE inhibitor/ARB as well as spironolactone secondary to relative hypotension and ARF -Remains on atorvastatin  3.Valvular heart disease: -He has previously been felt to be a poor candidate for redo valvuloplasty in the setting of medication nonadherence and polysubstance abuse -This can be followed up by his primary cardiologist in the outpatient setting  4.Alcoholic/hepatitis C cirrhosis/anasarca: -May becoming encephalopathic  -Patient has ongoing alcohol use with cessation being recommended -High risk of esophageal varices, though none documented on imaging  -Urine drug screen positive for cocaine -Unable to add spironolactone secondary to acute renal failure -His hypoalbuminemia is likely contributing to his third spacing -GI consult consult noted with patient being placed on prednisone  5.ARF: -Baseline approximately 0.9-1.2 with continued worsening of renal function up to 3.32 this morning -IV Lasix has been held -Continue to monitor -Consider nephrology consult  6.Anemia/thrombocytopenia: -Stable -Continue to monitor with ASA   Dispo: -Overall patient's prognosis is quite poor.  He has been evaluated by palliative care with recommendation for continued level of treatment.  That said, should patient's condition deteriorate requiring advanced life support with intubation he would not want this to be prolonged greater than 2 weeks.   For questions or updates, please contact [facility] Please consult [email or web address] for contact info under Cardiology/STEMI.    Signed, Eula Listen, PA-C [facility] Pager: [phone] 05/22/2019, 9:50 AM

## 2019-05-23 LAB — HEPATIC FUNCTION PANEL
ALT: 27 U/L (ref 0–44)
AST: 65 U/L — ABNORMAL HIGH (ref 15–41)
Albumin: 2.8 g/dL — ABNORMAL LOW (ref 3.5–5.0)
Alkaline Phosphatase: 144 U/L — ABNORMAL HIGH (ref 38–126)
Bilirubin, Direct: 6 mg/dL — ABNORMAL HIGH (ref 0.0–0.2)
Indirect Bilirubin: 4.1 mg/dL — ABNORMAL HIGH (ref 0.3–0.9)
Total Bilirubin: 10.1 mg/dL — ABNORMAL HIGH (ref 0.3–1.2)
Total Protein: 7.3 g/dL (ref 6.5–8.1)

## 2019-05-23 LAB — BASIC METABOLIC PANEL
Anion gap: 20 — ABNORMAL HIGH (ref 5–15)
BUN: 92 mg/dL — ABNORMAL HIGH (ref 6–20)
CO2: 20 mmol/L — ABNORMAL LOW (ref 22–32)
Calcium: 8.9 mg/dL (ref 8.9–10.3)
Chloride: 92 mmol/L — ABNORMAL LOW (ref 98–111)
Creatinine, Ser: 3.71 mg/dL — ABNORMAL HIGH (ref 0.61–1.24)
GFR calc Af Amer: 19 mL/min — ABNORMAL LOW (ref >60)
GFR calc non Af Amer: 17 mL/min — ABNORMAL LOW (ref >60)
Glucose, Bld: 154 mg/dL — ABNORMAL HIGH (ref 70–99)
Potassium: 5.4 mmol/L — ABNORMAL HIGH (ref 3.5–5.1)
Sodium: 132 mmol/L — ABNORMAL LOW (ref 135–145)

## 2019-05-23 LAB — CBC
HCT: 35.2 % — ABNORMAL LOW (ref 39.0–52.0)
Hemoglobin: 11 g/dL — ABNORMAL LOW (ref 13.0–17.0)
MCH: 21.2 pg — ABNORMAL LOW (ref 26.0–34.0)
MCHC: 31.3 g/dL (ref 30.0–36.0)
MCV: 68 fL — ABNORMAL LOW (ref 80.0–100.0)
Platelets: 83 10*3/uL — ABNORMAL LOW (ref 150–400)
RBC: 5.18 MIL/uL (ref 4.22–5.81)
RDW: 26.6 % — ABNORMAL HIGH (ref 11.5–15.5)
WBC: 6.2 10*3/uL (ref 4.0–10.5)
nRBC: 1.5 % — ABNORMAL HIGH (ref 0.0–0.2)

## 2019-05-23 LAB — PROTIME-INR
INR: 3.4 — ABNORMAL HIGH (ref 0.8–1.2)
Prothrombin Time: 34.4 seconds — ABNORMAL HIGH (ref 11.4–15.2)

## 2019-05-23 MED ORDER — SODIUM CHLORIDE 0.9 % IV SOLN: INTRAVENOUS | Status: DC

## 2019-05-23 MED ORDER — ACETAMINOPHEN 325 MG PO TABS: 650.0000 mg | ORAL_TABLET | Freq: Four times a day (QID) | ORAL | Status: DC | PRN

## 2019-05-23 MED ORDER — MIDODRINE HCL 5 MG PO TABS
5.0000 mg | ORAL_TABLET | Freq: Three times a day (TID) | ORAL | Status: DC
  Administered 2019-05-23 – 2019-05-25 (×7): 5 mg via ORAL
  Filled 2019-05-23 (×8): qty 1

## 2019-05-23 MED ORDER — OCTREOTIDE ACETATE 100 MCG/ML IJ SOLN
100.0000 ug | Freq: Three times a day (TID) | INTRAMUSCULAR | Status: DC
  Administered 2019-05-23 – 2019-05-25 (×7): 100 ug via SUBCUTANEOUS
  Filled 2019-05-23 (×11): qty 1

## 2019-05-23 MED ORDER — OXYCODONE HCL 5 MG PO TABS
5.0000 mg | ORAL_TABLET | ORAL | Status: DC | PRN
  Administered 2019-05-23 – 2019-05-25 (×3): 5 mg via ORAL
  Filled 2019-05-23 (×3): qty 1

## 2019-05-23 MED ORDER — PATIROMER SORBITEX CALCIUM 8.4 G PO PACK
16.8000 g | PACK | Freq: Every day | ORAL | Status: DC
  Administered 2019-05-23 – 2019-05-24 (×2): 16.8 g via ORAL
  Filled 2019-05-23 (×4): qty 2

## 2019-05-23 MED ORDER — ALBUMIN HUMAN 25 % IV SOLN
25.0000 g | Freq: Three times a day (TID) | INTRAVENOUS | Status: DC
  Administered 2019-05-23 – 2019-05-25 (×8): 25 g via INTRAVENOUS
  Filled 2019-05-23 (×11): qty 100

## 2019-05-23 NOTE — Progress Notes (Signed)
Progress Note  Patient Name: [name] Date of Encounter: 05/23/2019  Primary Cardiologist: End  Subjective   Sitting up in a recliner this morning Reports he did not sleep well Anorexia, general malaise Denies significant shortness of breath Telemetry reviewed, atrial flutter rate 100 beats this morning 11 PM last night rate up to 180 bpm  Inpatient Medications    Scheduled Meds: . aspirin EC  81 mg Oral Daily  . atorvastatin  40 mg Oral q1800  . azithromycin  500 mg Oral Daily  . feeding supplement (ENSURE ENLIVE)  237 mL Oral TID BM  . FLUoxetine  20 mg Oral Daily  . ipratropium-albuterol  3 mL Nebulization TID  . magnesium oxide  400 mg Oral BID  . Melatonin  2.5 mg Oral QHS  . midodrine  5 mg Oral TID WC  . mirtazapine  15 mg Oral QHS  . multivitamin with minerals  1 tablet Oral Daily  . octreotide  100 mcg Subcutaneous TID  . pantoprazole  40 mg Oral Daily  . patiromer  16.8 g Oral Daily  . predniSONE  40 mg Oral QAC breakfast   Continuous Infusions: . sodium chloride 500 mL (05/22/19 2132)  . sodium chloride 40 mL/hr at 05/23/19 1136  . albumin human 25 g (05/23/19 1316)  . cefTRIAXone (ROCEPHIN)  IV 1 g (05/22/19 2133)   PRN Meds: sodium chloride, albuterol, metoprolol tartrate, ondansetron **OR** ondansetron (ZOFRAN) IV   Vital Signs    Vitals:   05/22/19 2359 05/23/19 0410 05/23/19 0740 05/23/19 1151  BP: 106/78 111/70 105/72 (!) 96/52  Pulse: (!) 111 89 91 91  Resp: (!) 24 16 18 19   Temp: (!) 97.5 F (36.4 C) (!) 97.4 F (36.3 C) 97.7 F (36.5 C) 98.4 F (36.9 C)  TempSrc: Oral Oral Oral   SpO2: 95% 99% 100% 100%  Weight:  67.3 kg    Height:        Intake/Output Summary (Last 24 hours) at 05/23/2019 1339 Last data filed at 05/23/2019 0740 Gross per 24 hour  Intake --  Output 100 ml  Net -100 ml   Filed Weights   06/12/2019 1944 05/23/19 0410  Weight: 66.7 kg 67.3 kg    Telemetry    Atrial flutter  Personally Reviewed  ECG     No new tracings- Personally Reviewed  Physical Exam   Constitutional: Mild distress, sitting up in a chair HENT:  Head: Normocephalic and atraumatic.  Eyes:  no discharge. No scleral icterus.  Neck: Normal range of motion. Neck supple. JVD present 10+   Cardiovascular: Tachycardic, regular , 3/6 SEM left sternal border , exam reveals no gallop and no friction rub. No edema No murmur heard. Pulmonary/Chest: Effort normal and breath sounds normal. No stridor. No respiratory distress.  no wheezes.  no rales.  no tenderness.  Abdominal: Soft.  no distension.  no tenderness.  Musculoskeletal: Normal range of motion.  no  tenderness or deformity.  Neurological:  normal muscle tone. Coordination normal. No atrophy Skin: Skin is warm and dry. No rash noted. not diaphoretic.  Psychiatric:  normal mood and affect. behavior is normal. Thought content normal.    Labs    Chemistry Recent Labs  Lab 05/21/19 0631 05/22/19 0518 05/23/19 0530  NA 137 132* 132*  K 4.8 4.6 5.4*  CL 95* 94* 92*  CO2 23 26 20*  GLUCOSE 46* 140* 154*  BUN 54* 73* 92*  CREATININE 2.43* 3.32* 3.71*  CALCIUM 9.0  8.6* 8.9  PROT 7.7 7.1 7.3  ALBUMIN 2.6* 2.5* 2.8*  AST 65* 55* 65*  ALT 31 28 27   ALKPHOS 165* 158* 144*  BILITOT 10.4* 9.5* 10.1*  GFRNONAA 28* 19* 17*  GFRAA 32* 22* 19*  ANIONGAP 19* 12 20*     Hematology Recent Labs  Lab 05/21/19 0631 05/22/19 0518 05/23/19 0530  WBC 11.1* 9.2 6.2  RBC 5.53 5.04 5.18  HGB 11.9* 10.9* 11.0*  HCT 37.5* 34.5* 35.2*  MCV 67.8* 68.5* 68.0*  MCH 21.5* 21.6* 21.2*  MCHC 31.7 31.6 31.3  RDW 25.9* 25.3* 26.6*  PLT 97* 91* 83*    Cardiac EnzymesNo results for input(s): TROPONINI in the last 168 hours. No results for input(s): TROPIPOC in the last 168 hours.   BNP Recent Labs  Lab 06/12/2019 1716  BNP 1,566.0*     DDimer No results for input(s): DDIMER in the last 168 hours.   Radiology    CT ABDOMEN PELVIS WO CONTRAST  Result Date:  05/21/2019 IMPRESSION: 1. Diffuse abdominal ascites and diffuse subcutaneous edema compatible with anasarca. 2. Hepatomegaly and diffuse fatty infiltration of the liver without focal lesion. Irregular hepatic border is similar the prior study, suggesting cirrhosis 3. Punctate nonobstructing stone in the left kidney. 4. Sigmoid diverticulosis without diverticulitis. 5. Aortic Atherosclerosis (ICD10-I70.0). Extensive atherosclerotic disease with probable stenosis in the proximal left iliac artery. 6. Bilateral pleural effusions, right greater than left. 7. Right lower lobe airspace disease concerning for pneumonia. Electronically Signed   By: 07/19/2019 M.D.   On: 05/21/2019 13:33   CT CHEST WO CONTRAST  Result Date: 05/20/2019 IMPRESSION: 1. Right pleural effusion associated with dependent lower lobe atelectasis. 2. No evidence of pneumonia.  No pulmonary edema. 3. Mild right upper lobe paraseptal emphysema. 4. Mild enlargement the heart with changes from previous cardiac surgery and aortic and mitral valve replacements. Aortic atherosclerosis. Mild left coronary artery atherosclerosis. 5. Small amount of ascites in the visualized upper abdomen. Aortic Atherosclerosis (ICD10-I70.0) and Emphysema (ICD10-J43.9). Electronically Signed   By: 07/18/2019 M.D.   On: 05/20/2019 12:38    Cardiac Studies   2D echo 01/2019: 1. Left ventricular ejection fraction, by visual estimation, is 40 to 45%. The left ventricle has mildly decreased function. Left ventricular septal wall thickness was normal. Normal left ventricular posterior wall thickness. There is no left  ventricular hypertrophy.  2. Abnormal septal motion consistent with left bundle branch block.  3. Idiopathic cardiomyopathy.  4. Moderately dilated left ventricular internal cavity size.  5. Global right ventricle has moderately reduced systolic function.The right ventricular size is moderately enlarged. No increase in right ventricular  wall thickness.  6. Left atrial size was moderately dilated.  7. Right atrial size was moderately dilated.  8. The mitral valve is degenerative. Moderate to severe mitral valve regurgitation. Mild mitral stenosis.  9. The tricuspid valve is grossly normal. Tricuspid valve regurgitation moderate-severe.  10. The aortic valve is grossly normal. Aortic valve regurgitation is mild. Mild to moderate aortic valve sclerosis/calcification without any evidence of aortic stenosis.  11. The pulmonic valve was grossly normal. Pulmonic valve regurgitation is mild.  12. Moderately elevated pulmonary artery systolic pressure.  13. Mild ant Akinesis.  14. IVCD.   Patient Profile     59 y.o. 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 Loleta Books.  Assessment & Plan    1. New onset atrial flutter with RVR: In general has been rate controlled around 90 up to 115 bpm Episode last night up to 180 bpm around 11 PM Secondary to hypotension, unable to advance his heart failure medications/rate control medications -We will try to avoid amiodarone given underlying cirrhosis, hepatic failure Digoxin not a good choice given worsening renal failure Poor candidate for anticoagulation given suspected varices, auto anticoagulated secondary to liver failure  2.Chronic combined systolic and diastolic CHF, NYHA class IV: Lasix has been held for worsening renal failure Ejection fraction 40 to 45% October 2020 before onset of atrial flutter Reports he has improved symptoms of redness of breath after several doses of Lasix on arrival to the hospital --Management challenging secondary to  moderate to severe MR, mild AMS, unable to restore normal sinus rhythm, alcohol abuse  3.Valvular heart disease: -He has previously been felt to be a poor candidate for redo valvuloplasty in the setting of medication nonadherence and polysubstance abuse --Medical management  4.Alcoholic/hepatitis C cirrhosis/anasarca: GI following Given elevated MELD score, Outlook is poor Consider palliative INR trending upwards which is a porcine, INR now 3.4 Total bilirubin remains at 10  5.ARF: Nephrology following, they have indicated he might need dialysis Case discussed with him in detail Outlook poor  6.Anemia/thrombocytopenia: Platelets trending downward in the setting of liver failure, climbing INR now 3.4 up from 2.5  Case discussed with nephrology,  Total encounter time more than 35 minutes  Greater than 50% was spent in counseling and coordination of care with the patient   For questions or updates, please contact [place] Please consult [email or web address] for contact info under Cardiology/STEMI.    Signed, Esmond Plants, MD, Ph.D [facility]

## 2019-05-23 NOTE — Progress Notes (Signed)
[facility]  ROUNDING NOTE   Subjective:  Kidney function worse today. BUN up to 92, Cr up to 3.71. UOP not accurate per pt report.  K up to 5.4.     Objective:  Vital signs in last 24 hours:  Temp:  [97.4 F (36.3 C)-98.4 F (36.9 C)] 98.4 F (36.9 C) (02/14 1151) Pulse Rate:  [59-117] 91 (02/14 1151) Resp:  [16-28] 19 (02/14 1151) BP: (96-115)/(52-78) 96/52 (02/14 1151) SpO2:  [94 %-100 %] 94 % (02/14 1423) Weight:  [67.3 kg] 67.3 kg (02/14 0410)  Weight change:  Filed Weights   05/16/2019 1944 05/23/19 0410  Weight: 66.7 kg 67.3 kg    Intake/Output: I/O last 3 completed shifts: In: 340 [P.O.:240; IV Piggyback:100] Out: 100 [Urine:100]   Intake/Output this shift:  No intake/output data recorded.  Physical Exam: General: No acute distress  Head: Normocephalic, atraumatic. Moist oral mucosal membranes  Eyes: Anicteric  Neck: Supple, trachea midline  Lungs:  Clear to auscultation, normal effort  Heart: S1S2 no rubs  Abdomen:  Soft, distension noted  Extremities: trace peripheral edema.  Neurologic: Awake, alert, following commands  Skin: No lesions       Basic Metabolic Panel: Recent Labs  Lab 06/05/2019 2018 05/21/2019 2018 05/18/2019 2329 06/03/2019 2329 05/20/19 0521 05/20/19 0521 05/21/19 0631 05/22/19 0518 05/23/19 0530  NA 137   < > 136  --  136  --  137 132* 132*  K 2.7*   < > 2.8*  --  3.4*  --  4.8 4.6 5.4*  CL 90*   < > 94*  --  95*  --  95* 94* 92*  CO2 33*   < > 28  --  28  --  23 26 20*  GLUCOSE 223*   < > 178*  --  106*  --  46* 140* 154*  BUN 39*   < > 39*  --  41*  --  54* 73* 92*  CREATININE 1.79*   < > 1.74*  --  1.84*  --  2.43* 3.32* 3.71*  CALCIUM 8.4*   < > 8.0*   < > 8.1*   < > 9.0 8.6* 8.9  MG 1.6*  --  1.6*  --   --   --  2.2  --   --   PHOS  --   --  2.4*  --   --   --   --   --   --    < > = values in this interval not displayed.    Liver Function Tests: Recent Labs  Lab 05/20/2019 2329 05/20/19 0521  05/21/19 0631 05/22/19 0518 05/23/19 0530  AST 52* 48* 65* 55* 65*  ALT 27 27 31 28 27   ALKPHOS 156* 153* 165* 158* 144*  BILITOT 8.9* 8.2* 10.4* 9.5* 10.1*  PROT 7.2 7.3 7.7 7.1 7.3  ALBUMIN 2.4* 2.5* 2.6* 2.5* 2.8*   No results for input(s): LIPASE, AMYLASE in the last 168 hours. Recent Labs  Lab 05/26/2019 2019  AMMONIA 25    CBC: Recent Labs  Lab 05/11/2019 1716 05/10/2019 1716 05/29/2019 2018 05/18/2019 2018 05/13/2019 2329 05/20/19 0521 05/21/19 0631 05/22/19 0518 05/23/19 0530  WBC 7.8   < > 6.7   < > 6.9 8.3 11.1* 9.2 6.2  NEUTROABS 5.8  --  5.3  --   --   --   --   --   --   HGB 11.9*   < > 11.8*   < >  10.7* 10.7* 11.9* 10.9* 11.0*  HCT 37.3*   < > 37.9*   < > 33.5* 33.9* 37.5* 34.5* 35.2*  MCV 66.8*   < > 68.5*   < > 67.1* 67.9* 67.8* 68.5* 68.0*  PLT 93*   < > 91*   < > 85* 82* 97* 91* 83*   < > = values in this interval not displayed.    Cardiac Enzymes: No results for input(s): CKTOTAL, CKMB, CKMBINDEX, TROPONINI in the last 168 hours.  BNP: Invalid input(s): POCBNP  CBG: Recent Labs  Lab 05/21/19 0744 05/21/19 0828 05/21/19 0855 05/21/19 1125  GLUCAP 62* 61* 73 87    Microbiology: Results for orders placed or performed during the hospital encounter of June 14, 2019  Blood culture (routine x 2)     Status: None (Preliminary result)   Collection Time: 2019/06/14  8:18 PM   Specimen: BLOOD  Result Value Ref Range Status   Specimen Description BLOOD RIGHT FOREARM  Final   Special Requests   Final    BOTTLES DRAWN AEROBIC AND ANAEROBIC Blood Culture adequate volume   Culture   Final    NO GROWTH 4 DAYS Performed at [facility], [address] [place], [place] [address]    Report Status PENDING  Incomplete  Blood culture (routine x 2)     Status: None (Preliminary result)   Collection Time: 06-14-2019  8:18 PM   Specimen: BLOOD  Result Value Ref Range Status   Specimen Description BLOOD RIGHT ANTECUBITAL  Final   Special Requests   Final     BOTTLES DRAWN AEROBIC AND ANAEROBIC Blood Culture adequate volume   Culture   Final    NO GROWTH 4 DAYS Performed at [facility], [address] [place], [place] [address]    Report Status PENDING  Incomplete  Respiratory Panel by RT PCR (Flu A&B, Covid) - Nasopharyngeal Swab     Status: None   Collection Time: 14-Jun-2019  8:19 PM   Specimen: Nasopharyngeal Swab  Result Value Ref Range Status   SARS Coronavirus 2 by RT PCR NEGATIVE NEGATIVE Final    Comment: (NOTE) SARS-CoV-2 target nucleic acids are NOT DETECTED. The SARS-CoV-2 RNA is generally detectable in upper respiratoy specimens during the acute phase of infection. The lowest concentration of SARS-CoV-2 viral copies this assay can detect is 131 copies/mL. A negative result does not preclude SARS-Cov-2 infection and should not be used as the sole basis for treatment or other patient management decisions. A negative result may occur with  improper specimen collection/handling, submission of specimen other than nasopharyngeal swab, presence of viral mutation(s) within the areas targeted by this assay, and inadequate number of viral copies (<131 copies/mL). A negative result must be combined with clinical observations, patient history, and epidemiological information. The expected result is Negative. Fact Sheet for Patients:  https://www.moore.com/ Fact Sheet for Healthcare Providers:  https://www.young.biz/ This test is not yet ap proved or cleared by the [place] FDA and  has been authorized for detection and/or diagnosis of SARS-CoV-2 by FDA under an Emergency Use Authorization (EUA). This EUA will remain  in effect (meaning this test can be used) for the duration of the COVID-19 declaration under Section 564(b)(1) of the Act, 21 U.S.C. section 360bbb-3(b)(1), unless the authorization is terminated or revoked sooner.    Influenza A by PCR NEGATIVE NEGATIVE Final    Influenza B by PCR NEGATIVE NEGATIVE Final    Comment: (NOTE) The Xpert Xpress SARS-CoV-2/FLU/RSV assay is intended as an  aid in  the diagnosis of influenza from Nasopharyngeal swab specimens and  should not be used as a sole basis for treatment. Nasal washings and  aspirates are unacceptable for Xpert Xpress SARS-CoV-2/FLU/RSV  testing. Fact Sheet for Patients: PinkCheek.be Fact Sheet for Healthcare Providers: GravelBags.it This test is not yet approved or cleared by the [place] FDA and  has been authorized for detection and/or diagnosis of SARS-CoV-2 by  FDA under an Emergency Use Authorization (EUA). This EUA will remain  in effect (meaning this test can be used) for the duration of the  Covid-19 declaration under Section 564(b)(1) of the Act, 21  U.S.C. section 360bbb-3(b)(1), unless the authorization is  terminated or revoked. Performed at [facility], [address] [place], [place] [address]     Coagulation Studies: Recent Labs    05/21/19 0921 05/23/19 0530  LABPROT 26.5* 34.4*  INR 2.5* 3.4*    Urinalysis: No results for input(s): COLORURINE, LABSPEC, PHURINE, GLUCOSEU, HGBUR, BILIRUBINUR, KETONESUR, PROTEINUR, UROBILINOGEN, NITRITE, LEUKOCYTESUR in the last 72 hours.  Invalid input(s): APPERANCEUR    Imaging: No results found.   Medications:   . sodium chloride 500 mL (05/22/19 2132)  . sodium chloride 40 mL/hr at 05/23/19 1136  . albumin human 25 g (05/23/19 1316)  . cefTRIAXone (ROCEPHIN)  IV 1 g (05/22/19 2133)   . aspirin EC  81 mg Oral Daily  . atorvastatin  40 mg Oral q1800  . azithromycin  500 mg Oral Daily  . feeding supplement (ENSURE ENLIVE)  237 mL Oral TID BM  . FLUoxetine  20 mg Oral Daily  . ipratropium-albuterol  3 mL Nebulization TID  . magnesium oxide  400 mg Oral BID  . Melatonin  2.5 mg Oral QHS  . midodrine  5 mg Oral TID WC  . mirtazapine  15 mg Oral QHS   . multivitamin with minerals  1 tablet Oral Daily  . octreotide  100 mcg Subcutaneous TID  . pantoprazole  40 mg Oral Daily  . patiromer  16.8 g Oral Daily  . predniSONE  40 mg Oral QAC breakfast   sodium chloride, acetaminophen, albuterol, metoprolol tartrate, ondansetron **OR** ondansetron (ZOFRAN) IV, oxyCODONE  Assessment/ Plan:  59 y.o. male with a PMHx of alcohol abuse, bipolar disorder, congestive heart failure, COPD, hypertension, valvular heart disease, who was admitted to [facility] on 05/13/2019 for evaluation of shortness of breath as well as acute kidney injury.  1.  Acute kidney injury, differential diagnosis between acute tubular necrosis versus hepatorenal syndrome. 2.  Alcoholic hepatitis. 3.  Hyponatremia. 4.  Anemia unspecified.  Plan: Patient does appear to have worsening kidney function.  BUN up to 92 mg 3.7.  Urine output not entirely accurate at the moment.  Patient to be continued on midodrine, octreotide, and albumin for now.  Patient is also been started on gentle IV fluid hydration with 0.9 normal saline at 40 cc/h.  Dialysis remains a possibility if renal function continues to deteriorate.  We have also started the patient on V[place] for hyperkalemia.  Overall prognosis quite guarded.   LOS: 4 Rogers Ditter 2/14/20213:49 PM

## 2019-05-23 NOTE — Plan of Care (Signed)
  Problem: Education: Goal: Knowledge of General Education information will improve Description: Including pain rating scale, medication(s)/side effects and non-pharmacologic comfort measures Outcome: Progressing   Problem: Health Behavior/Discharge Planning: Goal: Ability to manage health-related needs will improve Outcome: Not Progressing Note: Patient has heart, renal, and liver failure r/t alcoholic hepatitis. Palliative care following. Prognosis dire per physician. Will continue to monitor overall progression for the remainder of the shift. Jari Favre [facility]

## 2019-05-23 NOTE — Progress Notes (Signed)
[name], MD [address]  [address]  [place], [place] [address]  Main: [phone]  Fax: [phone] Pager: [phone]   Subjective: No acute events overnight, has been afebrile, borderline low blood pressures, patient reports having bowel movements, denies black stools, rectal bleeding, abdominal pain, nausea or vomiting.  Worsening creatinine, hyperkalemia, decreased urine output   Objective: Vital signs in last 24 hours: Vitals:   05/23/19 0740 05/23/19 1151 05/23/19 1423 05/23/19 1647  BP: 105/72 (!) 96/52  (!) 105/55  Pulse: 91 91  93  Resp: 18 19  18   Temp: 97.7 F (36.5 C) 98.4 F (36.9 C)  98.2 F (36.8 C)  TempSrc: Oral     SpO2: 100% 100% 94% 100%  Weight:      Height:       Weight change:   Intake/Output Summary (Last 24 hours) at 05/23/2019 1730 Last data filed at 05/23/2019 0740 Gross per 24 hour  Intake --  Output 0 ml  Net 0 ml     Exam: Heart:: Regular rate and rhythm, S1S2 present or without murmur or extra heart sounds Lungs: normal and clear to auscultation Abdomen: soft, nontender, normal bowel sounds   Lab Results: CBC Latest Ref Rng & Units 05/23/2019 05/22/2019 05/21/2019  WBC 4.0 - 10.5 K/uL 6.2 9.2 11.1(H)  Hemoglobin 13.0 - 17.0 g/dL 11.0(L) 10.9(L) 11.9(L)  Hematocrit 39.0 - 52.0 % 35.2(L) 34.5(L) 37.5(L)  Platelets 150 - 400 K/uL 83(L) 91(L) 97(L)   CMP Latest Ref Rng & Units 05/23/2019 05/22/2019 05/21/2019  Glucose 70 - 99 mg/dL 07/19/2019) 937(J) 696(V)  BUN 6 - 20 mg/dL 89(F) 81(O) 17(P)  Creatinine 0.61 - 1.24 mg/dL 10(C) 5.85(I) 7.78(E)  Sodium 135 - 145 mmol/L 132(L) 132(L) 137  Potassium 3.5 - 5.1 mmol/L 5.4(H) 4.6 4.8  Chloride 98 - 111 mmol/L 92(L) 94(L) 95(L)  CO2 22 - 32 mmol/L 20(L) 26 23  Calcium 8.9 - 10.3 mg/dL 8.9 4.23(N) 9.0  Total Protein 6.5 - 8.1 g/dL 7.3 7.1 7.7  Total Bilirubin 0.3 - 1.2 mg/dL 10.1(H) 9.5(H) 10.4(H)  Alkaline Phos 38 - 126 U/L 144(H) 158(H) 165(H)  AST 15 - 41 U/L 65(H) 55(H)  65(H)  ALT 0 - 44 U/L 27 28 31     Micro Results: Recent Results (from the past 240 hour(s))  Blood culture (routine x 2)     Status: None (Preliminary result)   Collection Time: 05/20/2019  8:18 PM   Specimen: BLOOD  Result Value Ref Range Status   Specimen Description BLOOD RIGHT FOREARM  Final   Special Requests   Final    BOTTLES DRAWN AEROBIC AND ANAEROBIC Blood Culture adequate volume   Culture   Final    NO GROWTH 4 DAYS Performed at [facility], [address] [place], [address] [place]    Report Status PENDING  Incomplete  Blood culture (routine x 2)     Status: None (Preliminary result)   Collection Time: 05/28/2019  8:18 PM   Specimen: BLOOD  Result Value Ref Range Status   Specimen Description BLOOD RIGHT ANTECUBITAL  Final   Special Requests   Final    BOTTLES DRAWN AEROBIC AND ANAEROBIC Blood Culture adequate volume   Culture   Final    NO GROWTH 4 DAYS Performed at [facility], [address] [place], [address] [place]    Report Status PENDING  Incomplete  Respiratory Panel by RT PCR (Flu A&B, Covid) - Nasopharyngeal Swab  Status: None   Collection Time: 05/31/2019  8:19 PM   Specimen: Nasopharyngeal Swab  Result Value Ref Range Status   SARS Coronavirus 2 by RT PCR NEGATIVE NEGATIVE Final    Comment: (NOTE) SARS-CoV-2 target nucleic acids are NOT DETECTED. The SARS-CoV-2 RNA is generally detectable in upper respiratoy specimens during the acute phase of infection. The lowest concentration of SARS-CoV-2 viral copies this assay can detect is 131 copies/mL. A negative result does not preclude SARS-Cov-2 infection and should not be used as the sole basis for treatment or other patient management decisions. A negative result may occur with  improper specimen collection/handling, submission of specimen other than nasopharyngeal swab, presence of viral mutation(s) within the areas targeted by this assay, and inadequate number of viral  copies (<131 copies/mL). A negative result must be combined with clinical observations, patient history, and epidemiological information. The expected result is Negative. Fact Sheet for Patients:  https://www.moore.com/ Fact Sheet for Healthcare Providers:  https://www.young.biz/ This test is not yet ap proved or cleared by the [place] FDA and  has been authorized for detection and/or diagnosis of SARS-CoV-2 by FDA under an Emergency Use Authorization (EUA). This EUA will remain  in effect (meaning this test can be used) for the duration of the COVID-19 declaration under Section 564(b)(1) of the Act, 21 U.S.C. section 360bbb-3(b)(1), unless the authorization is terminated or revoked sooner.    Influenza A by PCR NEGATIVE NEGATIVE Final   Influenza B by PCR NEGATIVE NEGATIVE Final    Comment: (NOTE) The Xpert Xpress SARS-CoV-2/FLU/RSV assay is intended as an aid in  the diagnosis of influenza from Nasopharyngeal swab specimens and  should not be used as a sole basis for treatment. Nasal washings and  aspirates are unacceptable for Xpert Xpress SARS-CoV-2/FLU/RSV  testing. Fact Sheet for Patients: https://www.moore.com/ Fact Sheet for Healthcare Providers: https://www.young.biz/ This test is not yet approved or cleared by the [place] FDA and  has been authorized for detection and/or diagnosis of SARS-CoV-2 by  FDA under an Emergency Use Authorization (EUA). This EUA will remain  in effect (meaning this test can be used) for the duration of the  Covid-19 declaration under Section 564(b)(1) of the Act, 21  U.S.C. section 360bbb-3(b)(1), unless the authorization is  terminated or revoked. Performed at [facility], [address], [place], [place] [address]    Studies/Results: No results found. Medications:  I have reviewed the patient's current medications. Prior to Admission:   Medications Prior to Admission  Medication Sig Dispense Refill Last Dose  . acetaminophen (TYLENOL) 500 MG tablet Take 1,000 mg by mouth every 8 (eight) hours.   May 27, 2019 at Unknown time  . albuterol (VENTOLIN HFA) 108 (90 Base) MCG/ACT inhaler Inhale 2 puffs into the lungs every 4 (four) hours as needed for wheezing or shortness of breath. 6.7 g 0 prn at prn  . aspirin EC 81 MG tablet Take 81 mg by mouth daily.   May 27, 2019 at Unknown time  . atorvastatin (LIPITOR) 40 MG tablet Take 40 mg by mouth daily.   06/05/2019 at Unknown time  . DULoxetine (CYMBALTA) 30 MG capsule Take 30 mg by mouth daily.   27-May-2019 at Unknown time  . FLUoxetine (PROZAC) 20 MG capsule Take 20 mg by mouth every morning.   May 27, 2019 at Unknown time  . gabapentin (NEURONTIN) 300 MG capsule Take 1 capsule (300 mg total) by mouth 3 (three) times daily. 90 capsule 3 05/30/2019 at Unknown time  . ipratropium (ATROVENT HFA) 17  MCG/ACT inhaler Inhale 2 puffs into the lungs 4 (four) times daily. 1 Inhaler 5 05/23/2019 at Unknown time  . meloxicam (MOBIC) 15 MG tablet Take 15 mg by mouth daily.   05/22/2019 at Unknown time  . mirtazapine (REMERON) 15 MG tablet Take 15 mg by mouth at bedtime.   05/18/2019 at Unknown time  . naproxen (NAPROSYN) 500 MG tablet Take 500 mg by mouth 2 (two) times daily with a meal.   05/11/2019 at Unknown time  . potassium chloride 20 MEQ TBCR Take 20 mEq by mouth daily. 30 tablet 5 05/14/2019 at Unknown time  . torsemide (DEMADEX) 20 MG tablet Take 3 tablets (60 mg total) by mouth daily. 90 tablet 3 05/25/2019 at Unknown time  . metoprolol succinate (TOPROL-XL) 25 MG 24 hr tablet Take 0.5 tablets (12.5 mg total) by mouth daily. 45 tablet 2    Scheduled: . aspirin EC  81 mg Oral Daily  . atorvastatin  40 mg Oral q1800  . azithromycin  500 mg Oral Daily  . feeding supplement (ENSURE ENLIVE)  237 mL Oral TID BM  . FLUoxetine  20 mg Oral Daily  . ipratropium-albuterol  3 mL Nebulization TID  . magnesium  oxide  400 mg Oral BID  . Melatonin  2.5 mg Oral QHS  . midodrine  5 mg Oral TID WC  . mirtazapine  15 mg Oral QHS  . multivitamin with minerals  1 tablet Oral Daily  . octreotide  100 mcg Subcutaneous TID  . pantoprazole  40 mg Oral Daily  . patiromer  16.8 g Oral Daily  . predniSONE  40 mg Oral QAC breakfast   Continuous: . sodium chloride 500 mL (05/22/19 2132)  . sodium chloride 40 mL/hr at 05/23/19 1136  . albumin human 25 g (05/23/19 1316)  . cefTRIAXone (ROCEPHIN)  IV 1 g (05/22/19 2133)   YNW:GNFAOZ chloride, acetaminophen, albuterol, metoprolol tartrate, ondansetron **OR** ondansetron (ZOFRAN) IV, oxyCODONE Anti-infectives (From admission, onward)   Start     Dose/Rate Route Frequency Ordered Stop   05/20/19 2000  azithromycin (ZITHROMAX) tablet 500 mg     500 mg Oral Daily 05/20/19 0818 06/01/19 1959   05/20/19 2000  cefTRIAXone (ROCEPHIN) 1 g in sodium chloride 0.9 % 100 mL IVPB     1 g 200 mL/hr over 30 Minutes Intravenous Every 24 hours 05/20/19 0818 01-Jun-2019 1959   05/29/2019 2230  cefTRIAXone (ROCEPHIN) 1 g in sodium chloride 0.9 % 100 mL IVPB     1 g 200 mL/hr over 30 Minutes Intravenous  Once 06/06/2019 2218 05/20/19 0409   06/01/2019 2230  azithromycin (ZITHROMAX) tablet 500 mg     500 mg Oral  Once 05/18/2019 2218 05/12/2019 2312     Scheduled Meds: . aspirin EC  81 mg Oral Daily  . atorvastatin  40 mg Oral q1800  . azithromycin  500 mg Oral Daily  . feeding supplement (ENSURE ENLIVE)  237 mL Oral TID BM  . FLUoxetine  20 mg Oral Daily  . ipratropium-albuterol  3 mL Nebulization TID  . magnesium oxide  400 mg Oral BID  . Melatonin  2.5 mg Oral QHS  . midodrine  5 mg Oral TID WC  . mirtazapine  15 mg Oral QHS  . multivitamin with minerals  1 tablet Oral Daily  . octreotide  100 mcg Subcutaneous TID  . pantoprazole  40 mg Oral Daily  . patiromer  16.8 g Oral Daily  . predniSONE  40 mg Oral QAC breakfast  Continuous Infusions: . sodium chloride 500 mL (05/22/19  2132)  . sodium chloride 40 mL/hr at 05/23/19 1136  . albumin human 25 g (05/23/19 1316)  . cefTRIAXone (ROCEPHIN)  IV 1 g (05/22/19 2133)   PRN Meds:.sodium chloride, acetaminophen, albuterol, metoprolol tartrate, ondansetron **OR** ondansetron (ZOFRAN) IV, oxyCODONE   Assessment: Principal Problem:   Hypokalemia Active Problems:   Pulmonary hypertension (HCC)   COPD (chronic obstructive pulmonary disease) (HCC)   Bipolar 1 disorder (HCC)   AKI (acute kidney injury) (HCC)   Hypomagnesemia   Alcoholic hepatitis with ascites   Hyperglycemia   Acute liver failure without hepatic coma   Goals of care, counseling/discussion   Palliative care by specialist   DNR (do not resuscitate) discussion  Plan: Acute on chronic alcoholic hepatitis Patient is started on prednisone on 05/22/2019, need to calculate Lille's score on day 5 to see if patient is responding to steroids in order to continue further bilirubin is still 10 High-protein diet, multivitamin plus thiamine and folate daily Vitamin K is not administered as patient has atrial flutter Complete abstinence from alcohol use Due to recurrent hospitalizations and worsening liver function, patient not willing to quit alcohol and ongoing polysubstance abuse, he will be appropriate candidate for palliative care, palliative care is already consulted.  He is not a candidate for liver transplant  Acute kidney injury: In setting of alcoholic hepatitis AKI or HRS, worsening renal function since admission Continue albumin infusion, midodrine and octreotide Nephrology has been consulted  Overall prognosis is guarded, with 30-day mortality greater than 50% if patient continues to drink alcohol and not respond to prednisone with worsening kidney function, worrisome for needing dialysis  Dr. Tobi Bastos will cover from tomorrow    LOS: 4 days   [name] 05/23/2019, 5:30 PM

## 2019-05-23 NOTE — Progress Notes (Signed)
PROGRESS NOTE    [name]  QQP:[id number] DOB: 10/28/1960 DOA: 05/21/2019 PCP: Medicine, [employer]      Brief Narrative:  [name] is a 59 y.o. 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  Went to see his Cardiologist prior to admission, was tachypneic, tachycardic and had hypokalemia and fluid overload, sent to ER for diuresis.  In the ER, hypertensive, pulse 114, WBC 6K, platelets 91K, creatinine 1.8, procalcitonin 0.39, lactate 3, Covid negative, chest x-ray with pneumonia versus edema with small effusions.  Started on empiric antibiotics and admitted.           Assessment & Plan:   Decompensated alcoholic hep C cirrhosis Ongoing alcohol use, variable reports, per GI, 80-120 oz beer daily. INR 2.5, Tbili stable ~9, albumin 2.5, Cr trending up 2.4 today.  MELD >30.  Discriminant >40.  Moderate ascites on exam and by CT imaging.   Creatinine continues to worsen.    -Continue prednisone, day 2 -Calculate Lille score on Feb 17  -Consult GI re: steroids, liver failure, risk of anticoagulation and risk of TEE -Consult Nephrology re: possible HRS, utility of splanchnic vasoconstrictors  -Low-sodium diet, fluid restriction        Acute renal failure Hyperkalemia Cr baseline 1.2.  Cr trending up to 3.7 today.  Suspect HRS.   -Consult Nephrology, appreciate cares -Start midodrine, octreotide, discussed with GI -Continue Albumin TID -Very cautious fluids, discussed with Nephrology -Start Veltassa   Atrial flutter, typical New onset.  Minimal improvement in Rate with metoprolol.  Agree with deferring anticoagulation in setting of rising INR.    Tachycardic overnight, resolved with metoprolol -PRN metoprolol only for HR >110   Possible pneumonia Procalcitonin 0.38, pneumonia  possible by chest x-ray. -Continue empiric azithromycin and ceftriaxone, day 5 of 5  Acute on chronic systolic and diastolic CHF History of aortic valve repair, twice (1990, 2000) History of mitral valve repair (1990, now with moderate mitral regurgitation and stenosis) Admitted with BNP >1500, effusions on exam.  Per PCP notes, dry weight 145 pounds, but clearly fluid overloaded on exam due to CHF and liver failure.  Diuretics started intiially, have been subsequently held due to worsening renal function.  Baseline creatinine 1.2. EF 40 to 45% last October.  -Hold Lasix -Consult Cardiology, appreciate cares -Strict I/Os, daily weights, telemetry  -Daily monitoring renal function -Lisinopril, spironolactone contraindicated -Continue atorvastatin, aspirin   COPD No wheezing to suggest active bronchospasm -Continue home LABA/LAMA   Alcohol use No real symptoms of withdrawal at present.  Oky to stop CIWA scoring. -Continue thiamine and folate  Thrombocytopenia Stable relative to baseline, no bleeding  GERD -Continue pantoprazole   Anemia of chronic disease Hemoglobin stable, no clinical bleeding here.   Depression -Continue home fluoxetine  Moderate protein calorie malnutrition As evidenced by severe chronic disease, poor oral intake here in the hospital, and diffuse loss of subcutaneous muscle mass and fat -Consult nutrition         Disposition: The patient was admitted with malaise, found to have combined heart, liver, and renal failure.  His renal failure continues to worsen, and he has hyperkalemia.  We have started treatment for HRS, and we will continue to evaluate for dialysis, appreciate expert guidance by nephrology, gastroenterology.  Palliative care also following, as he has a very dire prognosis.  MDM: The below labs and imaging reports reviewed and summarized above.  Medication management as above.  This is a severe multiorgan  failure, with severe threat to life or limb or bodily function.      DVT prophylaxis: SCDs Code Status: Full code Family Communication: Patient deferred    Consultants:   Cardiology  GI  Paliative Care  Nephrology   Procedures:   2/10 chest x-ray-right lower lobe opacity, possible pneumonia  2/11 CT chest-effusion, no pneumonia or mass  2/12 CT abdomen -- ascites  Antimicrobials:   Ceftriaxone and azithromycin 2/11>>  Culture data:   2/10 blood culture x2-no growth           Subjective: Patient is uncomfortable, swollen, nauseated, poor appetite.  No hematemesis, melena, confusion, fever.  Is have abdominal pain persists, and he is fairly dyspneic, worse with laying flat.      Objective: Vitals:   05/22/19 2230 05/22/19 2359 05/23/19 0410 05/23/19 0740  BP: 115/69 106/78 111/70 105/72  Pulse: (!) 117 (!) 111 89 91  Resp: (!) 24 (!) 24 16 18   Temp:  (!) 97.5 F (36.4 C) (!) 97.4 F (36.3 C) 97.7 F (36.5 C)  TempSrc:  Oral Oral Oral  SpO2: 100% 95% 99% 100%  Weight:   67.3 kg   Height:        Intake/Output Summary (Last 24 hours) at 05/23/2019 1108 Last data filed at 05/23/2019 0740 Gross per 24 hour  Intake 240 ml  Output 100 ml  Net 140 ml   Filed Weights   06/08/19 1944 05/23/19 0410  Weight: 66.7 kg 67.3 kg    Examination: General appearance: Thin adult male, alert and in mild distress from dyspnea, discomfort.   HEENT: Anicteric, conjunctiva pink, lids and lashes normal. No nasal deformity, discharge, epistaxis.  Lips dry, dentition poor, oropharynx tacky dry, no oral lesions, hearing normal  Skin: Warm and dry.  No suspicious rashes or lesions. Cardiac: RRR, high-pitched blowing MR murmur.  1+ LE edema.    Respiratory: Mild tachypnea, no accessory muscle use, lung sounds diminished bilaterally, no rales, wheezing. Abdomen: Abdomen soft.  Diffusely tender to palpation, probably more on the right upper quadrant.  Moderate ascites,  moderate hepatosplenomegaly, no rebound or rigidity.   MSK: No deformities or effusions of the large joints of the upper or lower extremities bilaterally.  Diffuse loss of subcutaneous muscle mass and fat. Neuro: Awake and alert. Naming is grossly intact, and the patient's recall, recent and remote, as well as general fund of knowledge seem within normal limits.  Muscle tone normal, without fasciculations.  Moves all extremities equally and with normal coordination.  Speech fluent.    Psych: Sensorium intact and responding to questions, attention normal. Affect blunted.  Judgment and insight appear normal.          Data Reviewed: I have personally reviewed following labs and imaging studies:  CBC: Recent Labs  Lab June 08, 2019 1716 Jun 08, 2019 1716 06/08/19 2018 08-Jun-2019 2018 06-08-2019 2329 05/20/19 0521 05/21/19 0631 05/22/19 0518 05/23/19 0530  WBC 7.8   < > 6.7   < > 6.9 8.3 11.1* 9.2 6.2  NEUTROABS 5.8  --  5.3  --   --   --   --   --   --   HGB 11.9*   < > 11.8*   < > 10.7* 10.7* 11.9* 10.9* 11.0*  HCT 37.3*   < > 37.9*   < > 33.5* 33.9* 37.5* 34.5* 35.2*  MCV 66.8*   < >  68.5*   < > 67.1* 67.9* 67.8* 68.5* 68.0*  PLT 93*   < > 91*   < > 85* 82* 97* 91* 83*   < > = values in this interval not displayed.   Basic Metabolic Panel: Recent Labs  Lab 05/12/2019 2018 05/23/2019 2018 06/04/2019 2329 05/20/19 0521 05/21/19 0631 05/22/19 0518 05/23/19 0530  NA 137   < > 136 136 137 132* 132*  K 2.7*   < > 2.8* 3.4* 4.8 4.6 5.4*  CL 90*   < > 94* 95* 95* 94* 92*  CO2 33*   < > 28 28 23 26  20*  GLUCOSE 223*   < > 178* 106* 46* 140* 154*  BUN 39*   < > 39* 41* 54* 73* 92*  CREATININE 1.79*   < > 1.74* 1.84* 2.43* 3.32* 3.71*  CALCIUM 8.4*   < > 8.0* 8.1* 9.0 8.6* 8.9  MG 1.6*  --  1.6*  --  2.2  --   --   PHOS  --   --  2.4*  --   --   --   --    < > = values in this interval not displayed.   GFR: Estimated Creatinine Clearance: 20.4 mL/min (A) (by C-G formula based on SCr of 3.71  mg/dL (H)). Liver Function Tests: Recent Labs  Lab 05/14/2019 2329 05/20/19 0521 05/21/19 0631 05/22/19 0518 05/23/19 0530  AST 52* 48* 65* 55* 65*  ALT 27 27 31 28 27   ALKPHOS 156* 153* 165* 158* 144*  BILITOT 8.9* 8.2* 10.4* 9.5* 10.1*  PROT 7.2 7.3 7.7 7.1 7.3  ALBUMIN 2.4* 2.5* 2.6* 2.5* 2.8*   No results for input(s): LIPASE, AMYLASE in the last 168 hours. Recent Labs  Lab 06/03/2019 2019  AMMONIA 25   Coagulation Profile: Recent Labs  Lab 05/25/2019 1716 06/04/2019 2018 05/21/19 0921 05/23/19 0530  INR 1.6* 1.7* 2.5* 3.4*   Cardiac Enzymes: No results for input(s): CKTOTAL, CKMB, CKMBINDEX, TROPONINI in the last 168 hours. BNP (last 3 results) No results for input(s): PROBNP in the last 8760 hours. HbA1C: No results for input(s): HGBA1C in the last 72 hours. CBG: Recent Labs  Lab 05/21/19 0744 05/21/19 0828 05/21/19 0855 05/21/19 1125  GLUCAP 62* 61* 73 87   Lipid Profile: No results for input(s): CHOL, HDL, LDLCALC, TRIG, CHOLHDL, LDLDIRECT in the last 72 hours. Thyroid Function Tests: No results for input(s): TSH, T4TOTAL, FREET4, T3FREE, THYROIDAB in the last 72 hours. Anemia Panel: No results for input(s): VITAMINB12, FOLATE, FERRITIN, TIBC, IRON, RETICCTPCT in the last 72 hours. Urine analysis:    Component Value Date/Time   COLORURINE AMBER (A) 05/18/2019 2222   APPEARANCEUR CLEAR (A) 05/25/2019 2222   APPEARANCEUR Clear 12/25/2013 0243   LABSPEC 1.009 06/03/2019 2222   LABSPEC 1.002 12/25/2013 0243   PHURINE 6.0 05/29/2019 2222   GLUCOSEU NEGATIVE 05/26/2019 2222   GLUCOSEU Negative 12/25/2013 0243   HGBUR NEGATIVE 05/28/2019 2222   BILIRUBINUR NEGATIVE 05/10/2019 2222   BILIRUBINUR Negative 12/25/2013 [place] 05/16/2019 2222   PROTEINUR NEGATIVE 05/18/2019 2222   NITRITE NEGATIVE 06/05/2019 2222   LEUKOCYTESUR NEGATIVE 05/13/2019 2222   LEUKOCYTESUR Negative 12/25/2013 0243   Sepsis  Labs: @LABRCNTIP (procalcitonin:4,lacticacidven:4)  ) Recent Results (from the past 240 hour(s))  Blood culture (routine x 2)     Status: None (Preliminary result)   Collection Time: 05/12/2019  8:18 PM   Specimen: BLOOD  Result Value Ref Range Status   Specimen Description  BLOOD RIGHT FOREARM  Final   Special Requests   Final    BOTTLES DRAWN AEROBIC AND ANAEROBIC Blood Culture adequate volume   Culture   Final    NO GROWTH 4 DAYS Performed at [facility], [address] [place], [place] [address]    Report Status PENDING  Incomplete  Blood culture (routine x 2)     Status: None (Preliminary result)   Collection Time: May 21, 2019  8:18 PM   Specimen: BLOOD  Result Value Ref Range Status   Specimen Description BLOOD RIGHT ANTECUBITAL  Final   Special Requests   Final    BOTTLES DRAWN AEROBIC AND ANAEROBIC Blood Culture adequate volume   Culture   Final    NO GROWTH 4 DAYS Performed at [facility] [address], [place], [place] [address]    Report Status PENDING  Incomplete  Respiratory Panel by RT PCR (Flu A&B, Covid) - Nasopharyngeal Swab     Status: None   Collection Time: 05-21-2019  8:19 PM   Specimen: Nasopharyngeal Swab  Result Value Ref Range Status   SARS Coronavirus 2 by RT PCR NEGATIVE NEGATIVE Final    Comment: (NOTE) SARS-CoV-2 target nucleic acids are NOT DETECTED. The SARS-CoV-2 RNA is generally detectable in upper respiratoy specimens during the acute phase of infection. The lowest concentration of SARS-CoV-2 viral copies this assay can detect is 131 copies/mL. A negative result does not preclude SARS-Cov-2 infection and should not be used as the sole basis for treatment or other patient management decisions. A negative result may occur with  improper specimen collection/handling, submission of specimen other than nasopharyngeal swab, presence of viral mutation(s) within the areas targeted by this assay, and inadequate number of viral  copies (<131 copies/mL). A negative result must be combined with clinical observations, patient history, and epidemiological information. The expected result is Negative. Fact Sheet for Patients:  https://www.moore.com/ Fact Sheet for Healthcare Providers:  https://www.young.biz/ This test is not yet ap proved or cleared by the [place] FDA and  has been authorized for detection and/or diagnosis of SARS-CoV-2 by FDA under an Emergency Use Authorization (EUA). This EUA will remain  in effect (meaning this test can be used) for the duration of the COVID-19 declaration under Section 564(b)(1) of the Act, 21 U.S.C. section 360bbb-3(b)(1), unless the authorization is terminated or revoked sooner.    Influenza A by PCR NEGATIVE NEGATIVE Final   Influenza B by PCR NEGATIVE NEGATIVE Final    Comment: (NOTE) The Xpert Xpress SARS-CoV-2/FLU/RSV assay is intended as an aid in  the diagnosis of influenza from Nasopharyngeal swab specimens and  should not be used as a sole basis for treatment. Nasal washings and  aspirates are unacceptable for Xpert Xpress SARS-CoV-2/FLU/RSV  testing. Fact Sheet for Patients: https://www.moore.com/ Fact Sheet for Healthcare Providers: https://www.young.biz/ This test is not yet approved or cleared by the [place] FDA and  has been authorized for detection and/or diagnosis of SARS-CoV-2 by  FDA under an Emergency Use Authorization (EUA). This EUA will remain  in effect (meaning this test can be used) for the duration of the  Covid-19 declaration under Section 564(b)(1) of the Act, 21  U.S.C. section 360bbb-3(b)(1), unless the authorization is  terminated or revoked. Performed at [facility] [address], [place], [place] [address]          Radiology Studies: CT ABDOMEN PELVIS WO CONTRAST  Result Date: 05/21/2019 CLINICAL DATA:  Diffuse abdominal pain  with increased right upper  quadrant pain. EXAM: CT ABDOMEN AND PELVIS WITHOUT CONTRAST TECHNIQUE: Multidetector CT imaging of the abdomen and pelvis was performed following the standard protocol without IV contrast. COMPARISON:  CT of the abdomen and pelvis without contrast 02/04/2019. Liver ultrasound 02/05/2019 FINDINGS: Lower chest: Bilateral pleural effusions are again seen, right greater than left. Associated airspace disease is present in the right lower lobe. Minimal atelectasis or scarring is present on left. The heart is enlarged. 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 No significant pericardial effusion is present. Hepatobiliary: Liver enlargement is stable, measuring 1.9 cm cephalo caudad. Liver is diffusely hypodense without focal lesions. There is some nodularity of the border. Gallbladder is contracted. Common bile duct is unremarkable. Pancreas: Unremarkable. No pancreatic ductal dilatation or surrounding inflammatory changes. Spleen: Normal in size without focal abnormality. Adrenals/Urinary Tract: Adrenal glands are normal bilaterally. A single punctate nonobstructing stone is present in the left kidney. No other focal lesions are present. Ureters are normal. The urinary bladder is within normal limits. Stomach/Bowel: Stomach is moderately distended. No mass lesion is present. The small bowel is unremarkable. Terminal ileum is within normal limits. Appendix is visualized and normal. The ascending and transverse colon are within normal limits. Diverticular changes are present in the sigmoid colon without focal inflammation to suggest diverticulitis. Vascular/Lymphatic: Atherosclerotic calcifications are present throughout the aorta and branch vessels. Dense calcifications and probable stenosis are again noted in the proximal left iliac artery. Reproductive: Prostate is unremarkable. Other: Diffuse abdominal ascites is noted. There is fluid about the liver. Extensive subcutaneous edema is  present as well. No discrete fluid collection or abscess is present. Musculoskeletal: Sclerotic changes are present at L5-S1. There is no significant change. Slight degenerative anterolisthesis is again noted at L3-4 and L4-5. No focal lytic or blastic lesions are present. Advanced degenerative changes are again noted in the left hip with diffused cystic changes on both sides the joint. IMPRESSION: 1. Diffuse abdominal ascites and diffuse subcutaneous edema compatible with anasarca. 2. Hepatomegaly and diffuse fatty infiltration of the liver without focal lesion. Irregular hepatic border is similar the prior study, suggesting cirrhosis 3. Punctate nonobstructing stone in the left kidney. 4. Sigmoid diverticulosis without diverticulitis. 5. Aortic Atherosclerosis (ICD10-I70.0). Extensive atherosclerotic disease with probable stenosis in the proximal left iliac artery. 6. Bilateral pleural effusions, right greater than left. 7. Right lower lobe airspace disease concerning for pneumonia. Electronically Signed   By: Marin Robertshristopher  Mattern M.D.   On: 05/21/2019 13:33        Scheduled Meds: . aspirin EC  81 mg Oral Daily  . atorvastatin  40 mg Oral q1800  . azithromycin  500 mg Oral Daily  . feeding supplement (ENSURE ENLIVE)  237 mL Oral TID BM  . FLUoxetine  20 mg Oral Daily  . ipratropium-albuterol  3 mL Nebulization TID  . magnesium oxide  400 mg Oral BID  . Melatonin  2.5 mg Oral QHS  . midodrine  5 mg Oral TID WC  . mirtazapine  15 mg Oral QHS  . multivitamin with minerals  1 tablet Oral Daily  . octreotide  100 mcg Subcutaneous TID  . pantoprazole  40 mg Oral Daily  . patiromer  16.8 g Oral Daily  . predniSONE  40 mg Oral QAC breakfast   Continuous Infusions: . sodium chloride 500 mL (05/22/19 2132)  . sodium chloride    . albumin human 25 g (05/23/19 0727)  . cefTRIAXone (ROCEPHIN)  IV 1 g (05/22/19 2133)     LOS: 4  days    Time spent: 35 minutes    Alberteen Sam,  MD [facility] 05/23/2019, 11:08 AM     Please page though AMION or Epic secure chat:  For [employer], Higher education careers adviser

## 2019-05-23 NOTE — Progress Notes (Signed)
Pt having increased frequency of episodes HR to180; HR is sustaining at rates around 116, (greater than 110 is the parameter) SBP is greater than 100; will medicate with metoprolol 12.5 mg po as ordered.   Will CTM.

## 2019-05-24 ENCOUNTER — Inpatient Hospital Stay: Admission: EM | Disposition: E | Payer: Self-pay | Source: Home / Self Care | Attending: Family Medicine

## 2019-05-24 DIAGNOSIS — K7200 Acute and subacute hepatic failure without coma: Secondary | ICD-10-CM

## 2019-05-24 DIAGNOSIS — N179 Acute kidney failure, unspecified: Secondary | ICD-10-CM

## 2019-05-24 DIAGNOSIS — E876 Hypokalemia: Secondary | ICD-10-CM

## 2019-05-24 HISTORY — PX: TEMPORARY DIALYSIS CATHETER: CATH118312

## 2019-05-24 LAB — GLUCOSE, CAPILLARY
Glucose-Capillary: 123 mg/dL — ABNORMAL HIGH (ref 70–99)
Glucose-Capillary: 206 mg/dL — ABNORMAL HIGH (ref 70–99)
Glucose-Capillary: 91 mg/dL (ref 70–99)
Glucose-Capillary: 110 mg/dL — ABNORMAL HIGH (ref 70–99)
Glucose-Capillary: 107 mg/dL — ABNORMAL HIGH (ref 70–99)
Glucose-Capillary: 106 mg/dL — ABNORMAL HIGH (ref 70–99)
Glucose-Capillary: 142 mg/dL — ABNORMAL HIGH (ref 70–99)
Glucose-Capillary: 118 mg/dL — ABNORMAL HIGH (ref 70–99)
Glucose-Capillary: 197 mg/dL — ABNORMAL HIGH (ref 70–99)
Glucose-Capillary: 130 mg/dL — ABNORMAL HIGH (ref 70–99)
Glucose-Capillary: 148 mg/dL — ABNORMAL HIGH (ref 70–99)
Glucose-Capillary: 111 mg/dL — ABNORMAL HIGH (ref 70–99)

## 2019-05-24 LAB — BASIC METABOLIC PANEL
Anion gap: 19 — ABNORMAL HIGH (ref 5–15)
BUN: 101 mg/dL — ABNORMAL HIGH (ref 6–20)
CO2: 20 mmol/L — ABNORMAL LOW (ref 22–32)
Calcium: 8.8 mg/dL — ABNORMAL LOW (ref 8.9–10.3)
Chloride: 92 mmol/L — ABNORMAL LOW (ref 98–111)
Creatinine, Ser: 3.69 mg/dL — ABNORMAL HIGH (ref 0.61–1.24)
GFR calc Af Amer: 20 mL/min — ABNORMAL LOW (ref >60)
GFR calc non Af Amer: 17 mL/min — ABNORMAL LOW (ref >60)
Glucose, Bld: 129 mg/dL — ABNORMAL HIGH (ref 70–99)
Potassium: 5.7 mmol/L — ABNORMAL HIGH (ref 3.5–5.1)
Sodium: 131 mmol/L — ABNORMAL LOW (ref 135–145)

## 2019-05-24 LAB — CBC
HCT: 34 % — ABNORMAL LOW (ref 39.0–52.0)
Hemoglobin: 10.9 g/dL — ABNORMAL LOW (ref 13.0–17.0)
MCH: 21.9 pg — ABNORMAL LOW (ref 26.0–34.0)
MCHC: 32.1 g/dL (ref 30.0–36.0)
MCV: 68.4 fL — ABNORMAL LOW (ref 80.0–100.0)
Platelets: 54 10*3/uL — ABNORMAL LOW (ref 150–400)
RBC: 4.97 MIL/uL (ref 4.22–5.81)
RDW: 25.7 % — ABNORMAL HIGH (ref 11.5–15.5)
WBC: 6.9 10*3/uL (ref 4.0–10.5)
nRBC: 3.8 % — ABNORMAL HIGH (ref 0.0–0.2)

## 2019-05-24 LAB — HEPATIC FUNCTION PANEL
ALT: 30 U/L (ref 0–44)
AST: 70 U/L — ABNORMAL HIGH (ref 15–41)
Albumin: 3.5 g/dL (ref 3.5–5.0)
Alkaline Phosphatase: 125 U/L (ref 38–126)
Bilirubin, Direct: 6 mg/dL — ABNORMAL HIGH (ref 0.0–0.2)
Indirect Bilirubin: 4 mg/dL — ABNORMAL HIGH (ref 0.3–0.9)
Total Bilirubin: 10 mg/dL — ABNORMAL HIGH (ref 0.3–1.2)
Total Protein: 7.6 g/dL (ref 6.5–8.1)

## 2019-05-24 LAB — PROTIME-INR
INR: 3.8 — ABNORMAL HIGH (ref 0.8–1.2)
Prothrombin Time: 37.5 seconds — ABNORMAL HIGH (ref 11.4–15.2)

## 2019-05-24 LAB — CULTURE, BLOOD (ROUTINE X 2)
Culture: NO GROWTH
Culture: NO GROWTH
Special Requests: ADEQUATE
Special Requests: ADEQUATE

## 2019-05-24 LAB — PHOSPHORUS: Phosphorus: 7.4 mg/dL — ABNORMAL HIGH (ref 2.5–4.6)

## 2019-05-24 SURGERY — TEMPORARY DIALYSIS CATHETER
Anesthesia: Moderate Sedation

## 2019-05-24 MED ORDER — LACTULOSE 10 GM/15ML PO SOLN
10.0000 g | Freq: Two times a day (BID) | ORAL | Status: DC
  Administered 2019-05-24 – 2019-05-25 (×2): 10 g via ORAL
  Filled 2019-05-24 (×3): qty 30

## 2019-05-24 MED ORDER — PENTAFLUOROPROP-TETRAFLUOROETH EX AERO
1.0000 "application " | INHALATION_SPRAY | CUTANEOUS | Status: DC | PRN
  Filled 2019-05-24: qty 30

## 2019-05-24 MED ORDER — CHLORHEXIDINE GLUCONATE CLOTH 2 % EX PADS
6.0000 | MEDICATED_PAD | Freq: Every day | CUTANEOUS | Status: DC
  Administered 2019-05-24 – 2019-05-26 (×2): 6 via TOPICAL

## 2019-05-24 MED ORDER — HEPARIN SODIUM (PORCINE) 1000 UNIT/ML DIALYSIS
1000.0000 [IU] | INTRAMUSCULAR | Status: DC | PRN
  Filled 2019-05-24: qty 1

## 2019-05-24 MED ORDER — LIDOCAINE HCL (PF) 1 % IJ SOLN
5.0000 mL | INTRAMUSCULAR | Status: DC | PRN
  Filled 2019-05-24: qty 5

## 2019-05-24 MED ORDER — SODIUM CHLORIDE 0.9 % IV SOLN: 100.0000 mL | INTRAVENOUS | Status: DC | PRN

## 2019-05-24 MED ORDER — LIDOCAINE-PRILOCAINE 2.5-2.5 % EX CREA
1.0000 "application " | TOPICAL_CREAM | CUTANEOUS | Status: DC | PRN
  Filled 2019-05-24: qty 5

## 2019-05-24 MED ORDER — ALTEPLASE 2 MG IJ SOLR: 2.0000 mg | Freq: Once | INTRAMUSCULAR | Status: DC | PRN

## 2019-05-24 SURGICAL SUPPLY — 7 items
COVER PROBE U/S 5X48 (MISCELLANEOUS) ×3 IMPLANT
KIT DIALYSIS CATH TRI 30X13 (CATHETERS) ×3 IMPLANT
PACK ANGIOGRAPHY (CUSTOM PROCEDURE TRAY) ×3 IMPLANT
SUT MNCRL AB 4-0 PS2 18 (SUTURE) ×3 IMPLANT
SUT PROLENE 0 CT 1 30 (SUTURE) ×3 IMPLANT
SUT VIC AB 3-0 SH 27 (SUTURE) ×2
SUT VIC AB 3-0 SH 27X BRD (SUTURE) ×1 IMPLANT

## 2019-05-24 NOTE — Progress Notes (Signed)
PROGRESS NOTE    [name]  WAQ:[id number] DOB: January 19, 1961 DOA: 06/05/2019 PCP: Medicine, [facility]      Brief Narrative:  [name] is a 59 y.o. 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  Went to see his Cardiologist prior to admission, was tachypneic, tachycardic and had hypokalemia and fluid overload, sent to ER for diuresis.  In the ER, hypertensive, pulse 114, WBC 6K, platelets 91K, creatinine 1.8, procalcitonin 0.39, lactate 3, Covid negative, chest x-ray with pneumonia versus edema with small effusions.  Started on empiric antibiotics and admitted.           Assessment & Plan:   Decompensated alcoholic hep C cirrhosis Ongoing alcohol use, variable reports, per GI, 80-120 oz beer daily. INR 2.5, Tbili stable ~9, albumin 2.5, Cr trending up 2.4 today.  MELD >30.  Discriminant >40.  Moderate ascites on exam and by CT imaging.   Creatinine continues to worsen.    -Continue prednisone, day 3 -Calculate Lille score on Feb 17  -Consult GI re: steroids, liver failure, risk of anticoagulation and risk of TEE -Consult Nephrology re: possible HRS, utility of splanchnic vasoconstrictors  -Low-sodium diet, fluid restriction        Acute renal failure Hyperkalemia Cr baseline 1.2.  Cr stable at 3.7 today.  Suspect HRS.   -Consult Nephrology, appreciate cares -Continue midodrine, octreotide, discussed with GI -Continue Albumin TID -Very cautious fluids, discussed with Nephrology -Nephrology to start dialysis today, place central line  Atrial flutter, typical New onset.  Minimal improvement in Rate with metoprolol.  Agree with deferring anticoagulation in setting of rising INR.    Tachycardic overnight, resolved with metoprolol -PRN metoprolol only for HR >110   Possible  pneumonia Procalcitonin 0.38, pneumonia possible by chest x-ray. 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  Per PCP notes, dry weight 145 pounds, but clearly fluid overloaded on exam due to CHF and liver failure.  Diuretics started intiially, have been subsequently held due to worsening renal function.  Baseline creatinine 1.2. EF 40 to 45% last October.  -Hold diuretics -Consult Cardiology, appreciate cares -Strict I/Os, daily weights, telemetry  -Daily monitoring renal function -Lisinopril, spironolactone contraindicated -Continue atorvastatin, aspirin   COPD No wheezing to suggest active bronchospasm -Continue home LABA/LAMA   Alcohol use No real symptoms of withdrawal at present.  Oky to stop CIWA scoring. -Continue thiamine and folate  Thrombocytopenia Stable relative to baseline, no bleeding  GERD -Continue pantoprazole   Anemia of chronic disease Hemoglobin stable, no clinical bleeding here.   Depression -Continue home fluoxetine  Moderate protein calorie malnutrition As evidenced by severe chronic disease, poor oral intake here in the hospital, and diffuse loss of subcutaneous muscle mass and fat -Consult nutrition         Disposition: The patient was admitted with malaise, found to have combined heart, liver, and renal failure.  His renal failure continues to worsen, and he has hyperkalemia.  His organ failure appears to be slightly worse today, will undergo dialysis.  Palliative care also following, as he has a very dire prognosis.  MDM: The below labs and imaging reports reviewed and summarized above.  Medication management as above. The patient has severe multiorgan failure with worsening  prognosis, will start dialysis today.   DVT prophylaxis: SCDs Code Status: Full code Family Communication: Patient deferred    Consultants:   Cardiology  GI  Paliative Care  Nephrology   Procedures:   2/10 chest x-ray-right lower lobe opacity, possible pneumonia  2/11 CT chest-effusion, no pneumonia or mass  2/12 CT abdomen -- ascites  Antimicrobials:   Ceftriaxone and azithromycin 2/11>>  Culture data:   2/10 blood culture x2-no growth           Subjective: He is quite anxious, trouble breathing, swelling slightly worse. No vomiting, hematemesis. A tiny scant amount of hemoptysis yesterday.   Objective: Vitals:   05/25/2019 1427 06/05/2019 1520 05/15/2019 1535 05/14/2019 1604  BP: 118/63 110/76 116/62 111/71  Pulse: (!) 109 (!) 115 (!) 108 63  Resp: 20 20 20 17   Temp: 97.8 F (36.6 C) 97.8 F (36.6 C)    TempSrc: Axillary Axillary    SpO2: 100% 96% 97% 96%  Weight:      Height:        Intake/Output Summary (Last 24 hours) at 05/23/2019 1627 Last data filed at 05/28/2019 1549 Gross per 24 hour  Intake 122.37 ml  Output 575 ml  Net -452.63 ml   Filed Weights   05/23/2019 1944 05/23/19 0410  Weight: 66.7 kg 67.3 kg    Examination: General appearance: Thin adult male, alert and in moderate distress.   HEENT: Mildly icteric, conjunctiva pink, lids and lashes normal. No nasal deformity, discharge, epistaxis.  Lips moist, oropharynx tacky dry, no oral lesions. Hearing normal. Skin: Warm and dry.  No suspicious rashes or lesions. Cardiac: RRR, loud systolic murmur. Trace bilateral LE edema.    Respiratory: Tachypneic, lung sounds diminished, no wheezing abdomen: Abdomen soft. Nonfocal tenderness to palpation, guarding throughout, ascites is moderate, hepatosplenomegaly is noted.  MSK: No deformities or effusions of the large joints of the upper or lower extremities bilaterally. There is loss of subcutaneous muscle mass and fat. Neuro: Awake and alert.  Naming is grossly intact, and the patient's recall, recent and remote, as well as general fund of knowledge seem within normal limits.  Muscle tone normal, without fasciculations.  Moves all extremities equally and with normal coordination.  Speech fluent.    Psych: Sensorium intact and responding to questions, attention normal. Affect normal.  Judgment and insight appear normal.           Data Reviewed: I have personally reviewed following labs and imaging studies:  CBC: Recent Labs  Lab 05/22/2019 1716 05/25/2019 1716 06/06/2019 2018 06/05/2019 2329 05/20/19 0521 05/21/19 0631 05/22/19 0518 05/23/19 0530 05/30/2019 0544  WBC 7.8   < > 6.7   < > 8.3 11.1* 9.2 6.2 6.9  NEUTROABS 5.8  --  5.3  --   --   --   --   --   --   HGB 11.9*   < > 11.8*   < > 10.7* 11.9* 10.9* 11.0* 10.9*  HCT 37.3*   < > 37.9*   < > 33.9* 37.5* 34.5* 35.2* 34.0*  MCV 66.8*   < > 68.5*   < > 67.9* 67.8* 68.5* 68.0* 68.4*  PLT 93*   < > 91*   < > 82* 97* 91* 83* 54*   < > = values in this interval not displayed.   Basic Metabolic Panel: Recent  Labs  Lab 05/31/2019 2018 05/14/2019 2018 05/26/2019 2329 05/20/2019 2329 05/20/19 0521 05/21/19 0631 05/22/19 0518 05/23/19 0530 May 30, 2019 0544  NA 137   < > 136   < > 136 137 132* 132* 131*  K 2.7*   < > 2.8*   < > 3.4* 4.8 4.6 5.4* 5.7*  CL 90*   < > 94*   < > 95* 95* 94* 92* 92*  CO2 33*   < > 28   < > 28 23 26  20* 20*  GLUCOSE 223*   < > 178*   < > 106* 46* 140* 154* 129*  BUN 39*   < > 39*   < > 41* 54* 73* 92* 101*  CREATININE 1.79*   < > 1.74*   < > 1.84* 2.43* 3.32* 3.71* 3.69*  CALCIUM 8.4*   < > 8.0*   < > 8.1* 9.0 8.6* 8.9 8.8*  MG 1.6*  --  1.6*  --   --  2.2  --   --   --   PHOS  --   --  2.4*  --   --   --   --   --   --    < > = values in this interval not displayed.   GFR: Estimated Creatinine Clearance: 20.5 mL/min (A) (by C-G formula based on SCr of 3.69 mg/dL (H)). Liver Function Tests: Recent Labs  Lab 05/20/19 0521 05/21/19 0631 05/22/19  0518 05/23/19 0530 05/30/2019 0544  AST 48* 65* 55* 65* 70*  ALT 27 31 28 27 30   ALKPHOS 153* 165* 158* 144* 125  BILITOT 8.2* 10.4* 9.5* 10.1* 10.0*  PROT 7.3 7.7 7.1 7.3 7.6  ALBUMIN 2.5* 2.6* 2.5* 2.8* 3.5   No results for input(s): LIPASE, AMYLASE in the last 168 hours. Recent Labs  Lab 05/12/2019 2019  AMMONIA 25   Coagulation Profile: Recent Labs  Lab 06/06/2019 1716 05/11/2019 2018 05/21/19 0921 05/23/19 0530 05-30-19 0544  INR 1.6* 1.7* 2.5* 3.4* 3.8*   Cardiac Enzymes: No results for input(s): CKTOTAL, CKMB, CKMBINDEX, TROPONINI in the last 168 hours. BNP (last 3 results) No results for input(s): PROBNP in the last 8760 hours. HbA1C: No results for input(s): HGBA1C in the last 72 hours. CBG: Recent Labs  Lab 05/23/19 1149 05/23/19 1648 05/30/2019 0746 05-30-19 1123 30-May-2019 1606  GLUCAP 118* 197* 130* 148* 111*   Lipid Profile: No results for input(s): CHOL, HDL, LDLCALC, TRIG, CHOLHDL, LDLDIRECT in the last 72 hours. Thyroid Function Tests: No results for input(s): TSH, T4TOTAL, FREET4, T3FREE, THYROIDAB in the last 72 hours. Anemia Panel: No results for input(s): VITAMINB12, FOLATE, FERRITIN, TIBC, IRON, RETICCTPCT in the last 72 hours. Urine analysis:    Component Value Date/Time   COLORURINE AMBER (A) 05/13/2019 2222   APPEARANCEUR CLEAR (A) 05/18/2019 2222   APPEARANCEUR Clear 12/25/2013 0243   LABSPEC 1.009 06/06/2019 2222   LABSPEC 1.002 12/25/2013 0243   PHURINE 6.0 05/17/2019 2222   GLUCOSEU NEGATIVE 05/28/2019 2222   GLUCOSEU Negative 12/25/2013 0243   HGBUR NEGATIVE 05/23/2019 2222   BILIRUBINUR NEGATIVE 05/21/2019 2222   BILIRUBINUR Negative 12/25/2013 0243   KETONESUR NEGATIVE 05/28/2019 2222   PROTEINUR NEGATIVE 05/10/2019 2222   NITRITE NEGATIVE 05/28/2019 2222   LEUKOCYTESUR NEGATIVE 05/10/2019 2222   LEUKOCYTESUR Negative 12/25/2013 0243   Sepsis Labs: @LABRCNTIP (procalcitonin:4,lacticacidven:4)  ) Recent Results (from the past  240 hour(s))  Blood culture (routine x 2)     Status: None   Collection Time: 06/01/2019  8:18 PM   Specimen: BLOOD  Result Value Ref Range Status   Specimen Description BLOOD RIGHT FOREARM  Final   Special Requests   Final    BOTTLES DRAWN AEROBIC AND ANAEROBIC Blood Culture adequate volume   Culture   Final    NO GROWTH 5 DAYS Performed at [facility], [address] [place], [place] [address]    Report Status 05/23/2019 FINAL  Final  Blood culture (routine x 2)     Status: None   Collection Time: 06/02/2019  8:18 PM   Specimen: BLOOD  Result Value Ref Range Status   Specimen Description BLOOD RIGHT ANTECUBITAL  Final   Special Requests   Final    BOTTLES DRAWN AEROBIC AND ANAEROBIC Blood Culture adequate volume   Culture   Final    NO GROWTH 5 DAYS Performed at [facility], [address] [place], [place] [address]    Report Status 06/03/2019 FINAL  Final  Respiratory Panel by RT PCR (Flu A&B, Covid) - Nasopharyngeal Swab     Status: None   Collection Time: 05/23/2019  8:19 PM   Specimen: Nasopharyngeal Swab  Result Value Ref Range Status   SARS Coronavirus 2 by RT PCR NEGATIVE NEGATIVE Final    Comment: (NOTE) SARS-CoV-2 target nucleic acids are NOT DETECTED. The SARS-CoV-2 RNA is generally detectable in upper respiratoy specimens during the acute phase of infection. The lowest concentration of SARS-CoV-2 viral copies this assay can detect is 131 copies/mL. A negative result does not preclude SARS-Cov-2 infection and should not be used as the sole basis for treatment or other patient management decisions. A negative result may occur with  improper specimen collection/handling, submission of specimen other than nasopharyngeal swab, presence of viral mutation(s) within the areas targeted by this assay, and inadequate number of viral copies (<131 copies/mL). A negative result must be combined with clinical observations, patient history, and epidemiological  information. The expected result is Negative. Fact Sheet for Patients:  https://www.moore.com/ Fact Sheet for Healthcare Providers:  https://www.young.biz/ This test is not yet ap proved or cleared by the [place] FDA and  has been authorized for detection and/or diagnosis of SARS-CoV-2 by FDA under an Emergency Use Authorization (EUA). This EUA will remain  in effect (meaning this test can be used) for the duration of the COVID-19 declaration under Section 564(b)(1) of the Act, 21 U.S.C. section 360bbb-3(b)(1), unless the authorization is terminated or revoked sooner.    Influenza A by PCR NEGATIVE NEGATIVE Final   Influenza B by PCR NEGATIVE NEGATIVE Final    Comment: (NOTE) The Xpert Xpress SARS-CoV-2/FLU/RSV assay is intended as an aid in  the diagnosis of influenza from Nasopharyngeal swab specimens and  should not be used as a sole basis for treatment. Nasal washings and  aspirates are unacceptable for Xpert Xpress SARS-CoV-2/FLU/RSV  testing. Fact Sheet for Patients: https://www.moore.com/ Fact Sheet for Healthcare Providers: https://www.young.biz/ This test is not yet approved or cleared by the [place] FDA and  has been authorized for detection and/or diagnosis of SARS-CoV-2 by  FDA under an Emergency Use Authorization (EUA). This EUA will remain  in effect (meaning this test can be used) for the duration of the  Covid-19 declaration under Section 564(b)(1) of the Act, 21  U.S.C. section 360bbb-3(b)(1), unless the authorization is  terminated or revoked. Performed at [facility], [address] [place], [place] [address]          Radiology Studies: No results found.  Scheduled Meds: . aspirin EC  81 mg Oral Daily  . atorvastatin  40 mg Oral q1800  . [START ON 05/25/2019] Chlorhexidine Gluconate Cloth  6 each Topical Q0600  . feeding supplement (ENSURE  ENLIVE)  237 mL Oral TID BM  . FLUoxetine  20 mg Oral Daily  . ipratropium-albuterol  3 mL Nebulization TID  . lactulose  10 g Oral BID  . magnesium oxide  400 mg Oral BID  . Melatonin  2.5 mg Oral QHS  . midodrine  5 mg Oral TID WC  . mirtazapine  15 mg Oral QHS  . multivitamin with minerals  1 tablet Oral Daily  . octreotide  100 mcg Subcutaneous TID  . pantoprazole  40 mg Oral Daily  . patiromer  16.8 g Oral Daily   Continuous Infusions: . sodium chloride Stopped (05/22/19 2216)  . sodium chloride Stopped (05/12/2019 2979)  . sodium chloride    . sodium chloride    . albumin human 25 g (05/12/2019 1235)     LOS: 5 days    Time spent: 35 minutes    Alberteen Sam, MD [facility] 05/21/2019, 4:27 PM     Please page though AMION or Epic secure chat:  For [employer], Higher education careers adviser

## 2019-05-24 NOTE — Consult Note (Signed)
[facility] Vascular Consult Note  MRN : [id number]  TIN [name] is a 59 y.o. ([birth year]) male who presents with chief complaint of  Chief Complaint  Patient presents with  . abnormal labs   History of Present Illness:  The patient is a 59 year old male with multiple medical issues (see below) who was sent to the [facility] emergency department by his primary care physician for "abnormal labs.  Patient is admitted to the hospital with hypokalemia, hypomagnesemia and new acute kidney injury.  Patient with continued poor appetite and general fatigue.  He denies any shortness of breath or chest pain.  Denies any fever, nausea vomiting.  Nephrology has been consulted and has been following the patient.  The patient's kidney function continues to worsen.  He is now hyperkalemic. The nephrology service has decided to initiate dialysis at this time, and we are asked to place a temporary dialysis catheter for immediate dialysis use.    Vascular surgery was consulted by Dr. Cherylann Ratel for placement of a temporary dialysis catheter.  Current Facility-Administered Medications  Medication Dose Route Frequency Provider Last Rate Last Admin  . 0.9 %  sodium chloride infusion   Intravenous PRN Alberteen Sam, MD   Stopped at 05/22/19 2216  . 0.9 %  sodium chloride infusion   Intravenous Continuous Danford, Earl Lites, MD   Stopped at May 27, 2019 [phone]  . acetaminophen (TYLENOL) tablet 650 mg  650 mg Oral Q6H PRN Danford, Christopher P, MD      . albumin human 25 % solution 25 g  25 g Intravenous Q8H Danford, Earl Lites, MD 60 mL/hr at May 27, 2019 1235 25 g at 27-May-2019 1235  . albuterol (PROVENTIL) (2.5 MG/3ML) 0.083% nebulizer solution 2.5 mg  2.5 mg Nebulization Q4H PRN Danford, Earl Lites, MD      . aspirin EC tablet 81 mg  81 mg Oral Daily Danford, Earl Lites, MD   81 mg at 06/06/2019 1003  . atorvastatin (LIPITOR) tablet 40 mg  40 mg Oral  q1800 Alberteen Sam, MD   40 mg at 05/23/19 1705  . feeding supplement (ENSURE ENLIVE) (ENSURE ENLIVE) liquid 237 mL  237 mL Oral TID BM Danford, Earl Lites, MD   237 mL at 05/23/19 1314  . FLUoxetine (PROZAC) capsule 20 mg  20 mg Oral Daily Danford, Earl Lites, MD   20 mg at 05/13/2019 1003  . ipratropium-albuterol (DUONEB) 0.5-2.5 (3) MG/3ML nebulizer solution 3 mL  3 mL Nebulization TID Alberteen Sam, MD   3 mL at 06/02/2019 1329  . lactulose (CHRONULAC) 10 GM/15ML solution 10 g  10 g Oral BID Wyline Mood, MD      . magnesium oxide (MAG-OX) tablet 400 mg  400 mg Oral BID Earlie Lou L, MD   400 mg at 06/01/2019 1003  . Melatonin TABS 2.5 mg  2.5 mg Oral QHS Danford, Earl Lites, MD   2.5 mg at 05/13/2019 0001  . metoprolol tartrate (LOPRESSOR) tablet 12.5 mg  12.5 mg Oral BID PRN Sondra Barges, PA-C   12.5 mg at 05/23/19 0009  . midodrine (PROAMATINE) tablet 5 mg  5 mg Oral TID WC Danford, Earl Lites, MD   5 mg at 05/25/2019 1230  . mirtazapine (REMERON) tablet 15 mg  15 mg Oral QHS Danford, Earl Lites, MD   15 mg at May 27, 2019 0003  . multivitamin with minerals tablet 1 tablet  1 tablet Oral Daily Rometta Emery, MD   1 tablet  at May 30, 2019 1003  . octreotide (SANDOSTATIN) injection 100 mcg  100 mcg Subcutaneous TID Alberteen Sam, MD   100 mcg at 2019-05-30 1003  . ondansetron (ZOFRAN) tablet 4 mg  4 mg Oral Q6H PRN Rometta Emery, MD       Or  . ondansetron (ZOFRAN) injection 4 mg  4 mg Intravenous Q6H PRN Earlie Lou L, MD      . oxyCODONE (Oxy IR/ROXICODONE) immediate release tablet 5 mg  5 mg Oral Q4H PRN Alberteen Sam, MD   5 mg at 05/23/19 1420  . pantoprazole (PROTONIX) EC tablet 40 mg  40 mg Oral Daily Earlie Lou L, MD   40 mg at 2019-05-30 1003  . patiromer Lelon Perla) packet 16.8 g  16.8 g Oral Daily Lateef, Munsoor, MD   16.8 g at 2019/05/30 1002   Past Medical History:  Diagnosis Date  . Alcohol abuse   . Bipolar 1 disorder (HCC)    . CHF (congestive heart failure) (HCC)   . COPD (chronic obstructive pulmonary disease) (HCC)   . Hip pain   . Hypertension   . Pulmonary HTN (HCC)   . Valvular heart disease    Past Surgical History:  Procedure Laterality Date  . AORTIC VALVE REPLACEMENT  2000   Social History Social History   Tobacco Use  . Smoking status: Current Every Day Smoker    Packs/day: 0.25    Types: Cigarettes  . Smokeless tobacco: Never Used  Substance Use Topics  . Alcohol use: Yes    Alcohol/week: 2.0 standard drinks    Types: 2 Cans of beer per week    Comment: drinks 40 ounce beers  . Drug use: Yes    Types: Cocaine, Marijuana    Comment: last did last week   Family History Family History  Problem Relation Age of Onset  . Heart failure Father   . Hypertension Father   Denies family history of peripheral artery disease, venous disease or renal disease.  No Known Allergies  REVIEW OF SYSTEMS (Negative unless checked)  Constitutional: [] Weight loss  [] Fever  [] Chills Cardiac: [] Chest pain   [] Chest pressure   [] Palpitations   [] Shortness of breath when laying flat   [] Shortness of breath at rest   [] Shortness of breath with exertion. Vascular:  [] Pain in legs with walking   [] Pain in legs at rest   [] Pain in legs when laying flat   [] Claudication   [] Pain in feet when walking  [] Pain in feet at rest  [] Pain in feet when laying flat   [] History of DVT   [] Phlebitis   [] Swelling in legs   [] Varicose veins   [] Non-healing ulcers Pulmonary:   [] Uses home oxygen   [] Productive cough   [] Hemoptysis   [] Wheeze  [] COPD   [] Asthma Neurologic:  [] Dizziness  [] Blackouts   [] Seizures   [] History of stroke   [] History of TIA  [] Aphasia   [] Temporary blindness   [] Dysphagia   [] Weakness or numbness in arms   [] Weakness or numbness in legs Musculoskeletal:  [] Arthritis   [] Joint swelling   [] Joint pain   [] Low back pain Hematologic:  [] Easy bruising  [] Easy bleeding   [] Hypercoagulable state   [] Anemic   [] Hepatitis Gastrointestinal:  [] Blood in stool   [] Vomiting blood  [] Gastroesophageal reflux/heartburn   [] Difficulty swallowing. Genitourinary:  [] Chronic kidney disease   [] Difficult urination  [] Frequent urination  [] Burning with urination   [] Blood in urine Skin:  [] Rashes   [] Ulcers   [] Wounds  Psychological:  [] History of anxiety   []  History of major depression.  Positive for generalized weakness, acute kidney injury and poor appetite  Physical Examination  Vitals:   06/01/2019 0347 05/22/2019 0743 06/02/2019 0902 05/26/2019 1120  BP: 94/60 112/60  106/61  Pulse: 91 90  96  Resp: 18 20  20   Temp:      TempSrc:      SpO2: 100% 100% 99% 100%  Weight:      Height:       Body mass index is 22.56 kg/m. Gen: WD/WN Head: [place]/AT, No temporalis wasting Ear/Nose/Throat: Hearing grossly intact, nares w/o erythema or drainage Eyes: Sclera non-icteric, conjunctiva clear Neck: Supple, no nuchal rigidity.  No JVD.  Pulmonary:  Good air movement, clear to auscultation bilaterally.  Cardiac: RRR, normal S1, S2, no Murmurs, rubs or gallops. Vascular: 2+ radial pulses Gastrointestinal: soft, non-tender/non-distended. No guarding/reflex.  Musculoskeletal: M/S 5/5 throughout.  Extremities without ischemic changes.  No deformity or atrophy. Mild Edema in the lower extremities bilaterally Neurologic: Intact. Psychiatric: Difficult to assess due to the severity of patient's illness. Dermatologic: No rashes or ulcers noted.   Lymph : No Cervical, Axillary, or Inguinal lymphadenopathy.  CBC Lab Results  Component Value Date   WBC 6.9 05/13/2019   HGB 10.9 (L) 05/26/2019   HCT 34.0 (L) 05/18/2019   MCV 68.4 (L) 05/26/2019   PLT 54 (L) 05/10/2019   BMET    Component Value Date/Time   NA 131 (L) 05/25/2019 0544   NA 136 01/01/2014 1539   K 5.7 (H) 06/06/2019 0544   K 3.8 01/01/2014 1539   CL 92 (L) 05/26/2019 0544   CL 104 01/01/2014 1539   CO2 20 (L) 05/29/2019 0544   CO2 23 01/01/2014  1539   GLUCOSE 129 (H) 06/04/2019 0544   GLUCOSE 97 01/01/2014 1539   BUN 101 (H) 05/13/2019 0544   BUN 4 (L) 01/01/2014 1539   CREATININE 3.69 (H) 05/23/2019 0544   CREATININE 0.87 01/01/2014 1539   CALCIUM 8.8 (L) 05/15/2019 0544   CALCIUM 8.6 01/01/2014 1539   GFRNONAA 17 (L) 05/10/2019 0544   GFRNONAA >60 01/01/2014 1539   GFRNONAA >60 12/24/2013 2301   GFRAA 20 (L) 06/02/2019 0544   GFRAA >60 01/01/2014 1539   GFRAA >60 12/24/2013 2301   Estimated Creatinine Clearance: 20.5 mL/min (A) (by C-G formula based on SCr of 3.69 mg/dL (H)).  COAG Lab Results  Component Value Date   INR 3.8 (H) 05/16/2019   INR 3.4 (H) 05/23/2019   INR 2.5 (H) 05/21/2019   Radiology CT ABDOMEN PELVIS WO CONTRAST  Result Date: 05/21/2019 CLINICAL DATA:  Diffuse abdominal pain with increased right upper quadrant pain. EXAM: CT ABDOMEN AND PELVIS WITHOUT CONTRAST TECHNIQUE: Multidetector CT imaging of the abdomen and pelvis was performed following the standard protocol without IV contrast. COMPARISON:  CT of the abdomen and pelvis without contrast 02/04/2019. Liver ultrasound 02/05/2019 FINDINGS: Lower chest: Bilateral pleural effusions are again seen, right greater than left. Associated airspace disease is present in the right lower lobe. Minimal atelectasis or scarring is present on left. The heart is enlarged. 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 No significant pericardial effusion is present. Hepatobiliary: Liver enlargement is stable, measuring 1.9 cm cephalo caudad. Liver is diffusely hypodense without focal lesions. There is some nodularity of the border. Gallbladder is contracted. Common bile duct is unremarkable. Pancreas: Unremarkable. No pancreatic ductal dilatation or surrounding inflammatory changes. Spleen: Normal in size without focal abnormality. Adrenals/Urinary Tract:  Adrenal glands are normal bilaterally. A single punctate nonobstructing stone is present in the left kidney. No other  focal lesions are present. Ureters are normal. The urinary bladder is within normal limits. Stomach/Bowel: Stomach is moderately distended. No mass lesion is present. The small bowel is unremarkable. Terminal ileum is within normal limits. Appendix is visualized and normal. The ascending and transverse colon are within normal limits. Diverticular changes are present in the sigmoid colon without focal inflammation to suggest diverticulitis. Vascular/Lymphatic: Atherosclerotic calcifications are present throughout the aorta and branch vessels. Dense calcifications and probable stenosis are again noted in the proximal left iliac artery. Reproductive: Prostate is unremarkable. Other: Diffuse abdominal ascites is noted. There is fluid about the liver. Extensive subcutaneous edema is present as well. No discrete fluid collection or abscess is present. Musculoskeletal: Sclerotic changes are present at L5-S1. There is no significant change. Slight degenerative anterolisthesis is again noted at L3-4 and L4-5. No focal lytic or blastic lesions are present. Advanced degenerative changes are again noted in the left hip with diffused cystic changes on both sides the joint. IMPRESSION: 1. Diffuse abdominal ascites and diffuse subcutaneous edema compatible with anasarca. 2. Hepatomegaly and diffuse fatty infiltration of the liver without focal lesion. Irregular hepatic border is similar the prior study, suggesting cirrhosis 3. Punctate nonobstructing stone in the left kidney. 4. Sigmoid diverticulosis without diverticulitis. 5. Aortic Atherosclerosis (ICD10-I70.0). Extensive atherosclerotic disease with probable stenosis in the proximal left iliac artery. 6. Bilateral pleural effusions, right greater than left. 7. Right lower lobe airspace disease concerning for pneumonia. Electronically Signed   By: Marin Roberts M.D.   On: 05/21/2019 13:33   CT CHEST WO CONTRAST  Result Date: 05/20/2019 CLINICAL DATA:  Short of breath  and cough. Recent COVID-19 exposure, negative test. 58 y.o. male with chronic heart failure, valvular disease with bioprosthetic aortic valve, mitral valve repair in 2000, hypertension, COPD, bipolar, hepatitis C with cirrhosis and polysubstance abuse who comes in for abnormal lab. Pt states he came here today for abnormal blood work. His potassium was low. Patient states that he just not been feeling well for the past few days. EXAM: CT CHEST WITHOUT CONTRAST TECHNIQUE: Multidetector CT imaging of the chest was performed following the standard protocol without IV contrast. COMPARISON:  08/07/2011 FINDINGS: Cardiovascular: Stable changes from previous cardiac surgery and mitral and aortic valve replacements. Heart is mildly enlarged. No pericardial effusion. Left coronary artery calcifications. Aorta is normal in caliber. There atherosclerotic calcifications. Main pulmonary artery dilated to 4.2 cm. Mediastinum/Nodes: No mediastinal or hilar masses. No enlarged lymph nodes. Trachea and esophagus are unremarkable. Lungs/Pleura: Small right pleural effusion. Fluid extends along the inferior right oblique fissure. No left pleural effusion. Dependent opacity in the right lower lobe is consistent with atelectasis. Azygos lobe with paraseptal emphysematous change noted in the right upper lobe anteriorly and medially. No lung mass or suspicious nodule. No convincing pneumonia and no pulmonary edema. No pneumothorax. Upper Abdomen: Small amount of ascites adjacent to the liver. No acute findings. Musculoskeletal: No fracture or acute finding. No osteoblastic or osteolytic lesions. IMPRESSION: 1. Right pleural effusion associated with dependent lower lobe atelectasis. 2. No evidence of pneumonia.  No pulmonary edema. 3. Mild right upper lobe paraseptal emphysema. 4. Mild enlargement the heart with changes from previous cardiac surgery and aortic and mitral valve replacements. Aortic atherosclerosis. Mild left coronary artery  atherosclerosis. 5. Small amount of ascites in the visualized upper abdomen. Aortic Atherosclerosis (ICD10-I70.0) and Emphysema (ICD10-J43.9). Electronically Signed  By: Amie Portland M.D.   On: 05/20/2019 12:38   DG Chest Portable 1 View  Result Date: 05/20/2019 CLINICAL DATA:  Shortness of breath. Pt to the er sent by his MD for abnormal labs. Pt says his potassium is low. EXAM: PORTABLE CHEST 1 VIEW COMPARISON:  Chest radiograph 01/30/2019 FINDINGS: Stable cardiomediastinal contours with enlarged heart size status post median sternotomy. Mild diffuse bilateral interstitial opacities. There are increased heterogeneous opacities at the right lung base with associated small right pleural effusion. There are mild linear opacities at the left lung base likely atelectasis or scarring. No pneumothorax. No acute finding in the visualized skeleton. IMPRESSION: Mild edema. Increased heterogeneous opacities at the right lung base, possibly representing atelectasis or infection, with associated small right pleural effusion. Mild opacities at the left base likely atelectasis. Electronically Signed   By: Emmaline Kluver M.D.   On: 06/03/2019 20:29   Assessment/Plan The patient is a 59 year old male with multiple medical issues who was sent to the [facility] emergency department by his primary care physician for "abnormal labs". Found to be in acute kidney injury with worsening kidney function.  1. ARF: We will proceed with temporary dialysis catheter placement at this time.  Risks and benefits discussed with patient and/or family, and the catheter will be placed to allow immediate initiation of dialysis.  If the patient's renal function does not improve throughout the hospital course, we will be happy to place a tunneled dialysis catheter for long term use prior to discharge.    2.  Hyperglycemia: On appropriate medications. Encouraged good control as its slows the progression of  atherosclerotic disease  3. Hypertension: On appropriate medications. Encouraged good control as its slows the progression of atherosclerotic disease  Discussed with Dr. Weldon Inches, PA-C  05/23/2019 2:18 PM

## 2019-05-24 NOTE — Telephone Encounter (Signed)
Spoke with lady at [facility]. She says she got this patient's information but no mention of referral or needing appointment. Advised I do not see anywhere where this was requested. She will delete the referral .

## 2019-05-24 NOTE — Op Note (Signed)
  OPERATIVE NOTE   PROCEDURE: 1. Ultrasound guidance for vascular access right femoral vein 2. Placement of a 30 cm triple lumen dialysis catheter right femoral vein  PRE-OPERATIVE DIAGNOSIS: 1. Acute renal failure   POST-OPERATIVE DIAGNOSIS: Same  SURGEON: Festus Barren, MD  ASSISTANT(S): Raul Del, PA-C  ANESTHESIA: local  ESTIMATED BLOOD LOSS: Minimal   FINDING(S): 1. None  SPECIMEN(S): None  INDICATIONS:  Patient is a 59 y.o.male who presents with renal failure and need for immediate dialysis.  Risks and benefits were discussed, and informed consent was obtained..  DESCRIPTION: After obtaining full informed written consent, the patient was laid flat in the bed. The right groin was sterilely prepped and draped in a sterile surgical field was created. The right femoral vein was visualized with ultrasound and found to be widely patent. It was then accessed under direct guidance without difficulty with a Seldinger needle and a permanent image was recorded. A J-wire was then placed. After skin nick and dilatation, a 30 cm triple lumen dialysis catheter was placed over the wire and the wire was removed. The lumens withdrew dark red nonpulsatile blood and flushed easily with sterile saline. The catheter was secured to the skin with 3 nylon sutures. Sterile dressing was placed.  COMPLICATIONS: None  CONDITION: Stable  Festus Barren 05/23/2019 3:17 PM  This note was created with Dragon Medical transcription system. Any errors in dictation are purely unintentional.

## 2019-05-24 NOTE — Progress Notes (Signed)
Jonathon Bellows , MD [address], [place], [place], [place], [address] [address] [place], [place], [place], [address] Phone: [phone]  Fax: [phone]   [name] is being followed for acute alcoholic hepatitis   Subjective: Feels sleepy- no new concerns    Objective: Vital signs in last 24 hours: Vitals:   05/11/2019 0347 05/25/2019 0743 06/02/2019 0902 05/11/2019 1120  BP: 94/60 112/60  106/61  Pulse: 91 90  96  Resp: 18 20  20   Temp:      TempSrc:      SpO2: 100% 100% 99% 100%  Weight:      Height:       Weight change:   Intake/Output Summary (Last 24 hours) at 06/05/2019 1325 Last data filed at 05/18/2019 0300 Gross per 24 hour  Intake 122.37 ml  Output 400 ml  Net -277.63 ml     Exam: Heart:: Regular rate and rhythm, S1S2 present or without murmur or extra heart sounds Lungs: normal, clear to auscultation and clear to auscultation and percussion Abdomen: soft, nontender, normal bowel sounds   Lab Results: @LABTEST2 @ Micro Results: Recent Results (from the past 240 hour(s))  Blood culture (routine x 2)     Status: None   Collection Time: 14-Jun-2019  8:18 PM   Specimen: BLOOD  Result Value Ref Range Status   Specimen Description BLOOD RIGHT FOREARM  Final   Special Requests   Final    BOTTLES DRAWN AEROBIC AND ANAEROBIC Blood Culture adequate volume   Culture   Final    NO GROWTH 5 DAYS Performed at [facility], [address] [place], [place] [address]    Report Status 05/14/2019 FINAL  Final  Blood culture (routine x 2)     Status: None   Collection Time: Jun 14, 2019  8:18 PM   Specimen: BLOOD  Result Value Ref Range Status   Specimen Description BLOOD RIGHT ANTECUBITAL  Final   Special Requests   Final    BOTTLES DRAWN AEROBIC AND ANAEROBIC Blood Culture adequate volume   Culture   Final    NO GROWTH 5 DAYS Performed at [facility], [address] [place], [place] [address]    Report Status 06/01/2019 FINAL  Final    Respiratory Panel by RT PCR (Flu A&B, Covid) - Nasopharyngeal Swab     Status: None   Collection Time: 14-Jun-2019  8:19 PM   Specimen: Nasopharyngeal Swab  Result Value Ref Range Status   SARS Coronavirus 2 by RT PCR NEGATIVE NEGATIVE Final    Comment: (NOTE) SARS-CoV-2 target nucleic acids are NOT DETECTED. The SARS-CoV-2 RNA is generally detectable in upper respiratoy specimens during the acute phase of infection. The lowest concentration of SARS-CoV-2 viral copies this assay can detect is 131 copies/mL. A negative result does not preclude SARS-Cov-2 infection and should not be used as the sole basis for treatment or other patient management decisions. A negative result may occur with  improper specimen collection/handling, submission of specimen other than nasopharyngeal swab, presence of viral mutation(s) within the areas targeted by this assay, and inadequate number of viral copies (<131 copies/mL). A negative result must be combined with clinical observations, patient history, and epidemiological information. The expected result is Negative. Fact Sheet for Patients:  PinkCheek.be Fact Sheet for Healthcare Providers:  GravelBags.it This test is not yet ap proved or cleared by the [place] FDA and  has been authorized for detection and/or diagnosis of SARS-CoV-2 by FDA under an Emergency  Use Authorization (EUA). This EUA will remain  in effect (meaning this test can be used) for the duration of the COVID-19 declaration under Section 564(b)(1) of the Act, 21 U.S.C. section 360bbb-3(b)(1), unless the authorization is terminated or revoked sooner.    Influenza A by PCR NEGATIVE NEGATIVE Final   Influenza B by PCR NEGATIVE NEGATIVE Final    Comment: (NOTE) The Xpert Xpress SARS-CoV-2/FLU/RSV assay is intended as an aid in  the diagnosis of influenza from Nasopharyngeal swab specimens and  should not be used as a sole  basis for treatment. Nasal washings and  aspirates are unacceptable for Xpert Xpress SARS-CoV-2/FLU/RSV  testing. Fact Sheet for Patients: https://www.moore.com/ Fact Sheet for Healthcare Providers: https://www.young.biz/ This test is not yet approved or cleared by the [place] FDA and  has been authorized for detection and/or diagnosis of SARS-CoV-2 by  FDA under an Emergency Use Authorization (EUA). This EUA will remain  in effect (meaning this test can be used) for the duration of the  Covid-19 declaration under Section 564(b)(1) of the Act, 21  U.S.C. section 360bbb-3(b)(1), unless the authorization is  terminated or revoked. Performed at [facility], [address] [place], [place] [address]    Studies/Results: No results found. Medications: I have reviewed the patient's current medications. Scheduled Meds: . aspirin EC  81 mg Oral Daily  . atorvastatin  40 mg Oral q1800  . feeding supplement (ENSURE ENLIVE)  237 mL Oral TID BM  . FLUoxetine  20 mg Oral Daily  . ipratropium-albuterol  3 mL Nebulization TID  . magnesium oxide  400 mg Oral BID  . Melatonin  2.5 mg Oral QHS  . midodrine  5 mg Oral TID WC  . mirtazapine  15 mg Oral QHS  . multivitamin with minerals  1 tablet Oral Daily  . octreotide  100 mcg Subcutaneous TID  . pantoprazole  40 mg Oral Daily  . patiromer  16.8 g Oral Daily   Continuous Infusions: . sodium chloride Stopped (05/22/19 2216)  . sodium chloride Stopped (06/16/2019 9485)  . albumin human 25 g (06/16/2019 1235)   PRN Meds:.sodium chloride, acetaminophen, albuterol, metoprolol tartrate, ondansetron **OR** ondansetron (ZOFRAN) IV, oxyCODONE   Assessment: Principal Problem:   Hypokalemia Active Problems:   Pulmonary hypertension (HCC)   COPD (chronic obstructive pulmonary disease) (HCC)   Bipolar 1 disorder (HCC)   AKI (acute kidney injury) (HCC)   Hypomagnesemia   Alcoholic hepatitis with  ascites   Hyperglycemia   Acute liver failure without hepatic coma   Goals of care, counseling/discussion   Palliative care by specialist   DNR (do not resuscitate) discussion   [name] 59 y.o. male with acute alcoholic hepatitis started on steroids on 05/22/2019 . Ongoing active alcohol use till admission. Complicated with renal failure ?HRS. Renal following on Octreotide, midodrine and PPI. In acute liver failure with INR 3.8 was 1.7 5 days back. Tbilirubin 10 ,  Creatinine 3.69 .   Plan: 1. Reasses steroids after a total of 5 days. Overall very poor prognosis with acute liver failure, renal failure.  2. Suggest palliative care consult.  3. Check acute hepatitis panel to rule out co existing viral hepatitis.  4. Lactulose titrated 2 soft bowel movements per day for hepatic encephelopathy.    LOS: 5 days   Wyline Mood, MD 06/16/19, 1:25 PM

## 2019-05-24 NOTE — Progress Notes (Signed)
[facility]  ROUNDING NOTE   Subjective:  Urine output documented as 400 cc over the preceding 24 hours. Renal function also worse today. BUN up to 101. Creatinine currently 3.69. Potassium also up to 5.7.    Objective:  Vital signs in last 24 hours:  Temp:  [97.8 F (36.6 C)-98.2 F (36.8 C)] 97.8 F (36.6 C) (02/15 1427) Pulse Rate:  [90-109] 109 (02/15 1427) Resp:  [16-20] 20 (02/15 1427) BP: (94-118)/(54-63) 118/63 (02/15 1427) SpO2:  [94 %-100 %] 100 % (02/15 1427)  Weight change:  Filed Weights   05/10/2019 1944 05/23/19 0410  Weight: 66.7 kg 67.3 kg    Intake/Output: I/O last 3 completed shifts: In: 122.4 [P.O.:120; I.V.:2.4] Out: 400 [Urine:400]   Intake/Output this shift:  No intake/output data recorded.  Physical Exam: General: No acute distress  Head: Normocephalic, atraumatic. Moist oral mucosal membranes  Eyes: Anicteric  Neck: Supple, trachea midline  Lungs:  Clear to auscultation, normal effort  Heart: S1S2 no rubs  Abdomen:  Soft, distension noted  Extremities: trace peripheral edema.  Neurologic: Awake, alert, following commands  Skin: No lesions       Basic Metabolic Panel: Recent Labs  Lab 05/28/2019 2018 06/01/2019 2018 05/29/2019 2329 05/31/2019 2329 05/20/19 2229 05/20/19 7989 05/21/19 0631 05/21/19 0631 05/22/19 0518 05/23/19 0530 05/18/2019 0544  NA 137   < > 136   < > 136  --  137  --  132* 132* 131*  K 2.7*   < > 2.8*   < > 3.4*  --  4.8  --  4.6 5.4* 5.7*  CL 90*   < > 94*   < > 95*  --  95*  --  94* 92* 92*  CO2 33*   < > 28   < > 28  --  23  --  26 20* 20*  GLUCOSE 223*   < > 178*   < > 106*  --  46*  --  140* 154* 129*  BUN 39*   < > 39*   < > 41*  --  54*  --  73* 92* 101*  CREATININE 1.79*   < > 1.74*   < > 1.84*  --  2.43*  --  3.32* 3.71* 3.69*  CALCIUM 8.4*   < > 8.0*   < > 8.1*   < > 9.0   < > 8.6* 8.9 8.8*  MG 1.6*  --  1.6*  --   --   --  2.2  --   --   --   --   PHOS  --   --  2.4*  --   --   --   --    --   --   --   --    < > = values in this interval not displayed.    Liver Function Tests: Recent Labs  Lab 05/20/19 0521 05/21/19 0631 05/22/19 0518 05/23/19 0530 05/29/2019 0544  AST 48* 65* 55* 65* 70*  ALT 27 31 28 27 30   ALKPHOS 153* 165* 158* 144* 125  BILITOT 8.2* 10.4* 9.5* 10.1* 10.0*  PROT 7.3 7.7 7.1 7.3 7.6  ALBUMIN 2.5* 2.6* 2.5* 2.8* 3.5   No results for input(s): LIPASE, AMYLASE in the last 168 hours. Recent Labs  Lab 06/04/2019 2019  AMMONIA 25    CBC: Recent Labs  Lab 05/26/2019 1716 05/25/2019 1716 05/21/2019 2018 05/30/2019 2329 05/20/19 0521 05/21/19 0631 05/22/19 0518 05/23/19 0530 05/18/2019 0544  WBC  7.8   < > 6.7   < > 8.3 11.1* 9.2 6.2 6.9  NEUTROABS 5.8  --  5.3  --   --   --   --   --   --   HGB 11.9*   < > 11.8*   < > 10.7* 11.9* 10.9* 11.0* 10.9*  HCT 37.3*   < > 37.9*   < > 33.9* 37.5* 34.5* 35.2* 34.0*  MCV 66.8*   < > 68.5*   < > 67.9* 67.8* 68.5* 68.0* 68.4*  PLT 93*   < > 91*   < > 82* 97* 91* 83* 54*   < > = values in this interval not displayed.    Cardiac Enzymes: No results for input(s): CKTOTAL, CKMB, CKMBINDEX, TROPONINI in the last 168 hours.  BNP: Invalid input(s): POCBNP  CBG: Recent Labs  Lab 05/23/19 0737 05/23/19 1149 05/23/19 1648 06/08/19 0746 06/08/19 1123  GLUCAP 142* 118* 197* 130* 148*    Microbiology: Results for orders placed or performed during the hospital encounter of 05/10/2019  Blood culture (routine x 2)     Status: None   Collection Time: 05/17/2019  8:18 PM   Specimen: BLOOD  Result Value Ref Range Status   Specimen Description BLOOD RIGHT FOREARM  Final   Special Requests   Final    BOTTLES DRAWN AEROBIC AND ANAEROBIC Blood Culture adequate volume   Culture   Final    NO GROWTH 5 DAYS Performed at [facility], [address] [place], [place] [address]    Report Status 06/08/19 FINAL  Final  Blood culture (routine x 2)     Status: None   Collection Time: 05/18/2019  8:18 PM    Specimen: BLOOD  Result Value Ref Range Status   Specimen Description BLOOD RIGHT ANTECUBITAL  Final   Special Requests   Final    BOTTLES DRAWN AEROBIC AND ANAEROBIC Blood Culture adequate volume   Culture   Final    NO GROWTH 5 DAYS Performed at [facility], [address] [place], [place] [address]    Report Status 06-08-2019 FINAL  Final  Respiratory Panel by RT PCR (Flu A&B, Covid) - Nasopharyngeal Swab     Status: None   Collection Time: 06/02/2019  8:19 PM   Specimen: Nasopharyngeal Swab  Result Value Ref Range Status   SARS Coronavirus 2 by RT PCR NEGATIVE NEGATIVE Final    Comment: (NOTE) SARS-CoV-2 target nucleic acids are NOT DETECTED. The SARS-CoV-2 RNA is generally detectable in upper respiratoy specimens during the acute phase of infection. The lowest concentration of SARS-CoV-2 viral copies this assay can detect is 131 copies/mL. A negative result does not preclude SARS-Cov-2 infection and should not be used as the sole basis for treatment or other patient management decisions. A negative result may occur with  improper specimen collection/handling, submission of specimen other than nasopharyngeal swab, presence of viral mutation(s) within the areas targeted by this assay, and inadequate number of viral copies (<131 copies/mL). A negative result must be combined with clinical observations, patient history, and epidemiological information. The expected result is Negative. Fact Sheet for Patients:  https://www.moore.com/ Fact Sheet for Healthcare Providers:  https://www.young.biz/ This test is not yet ap proved or cleared by the [place] FDA and  has been authorized for detection and/or diagnosis of SARS-CoV-2 by FDA under an Emergency Use Authorization (EUA). This EUA will remain  in effect (meaning this test can be used) for the duration of the COVID-19  declaration under Section 564(b)(1) of the Act, 21  U.S.C. section 360bbb-3(b)(1), unless the authorization is terminated or revoked sooner.    Influenza A by PCR NEGATIVE NEGATIVE Final   Influenza B by PCR NEGATIVE NEGATIVE Final    Comment: (NOTE) The Xpert Xpress SARS-CoV-2/FLU/RSV assay is intended as an aid in  the diagnosis of influenza from Nasopharyngeal swab specimens and  should not be used as a sole basis for treatment. Nasal washings and  aspirates are unacceptable for Xpert Xpress SARS-CoV-2/FLU/RSV  testing. Fact Sheet for Patients: https://www.moore.com/ Fact Sheet for Healthcare Providers: https://www.young.biz/ This test is not yet approved or cleared by the [place] FDA and  has been authorized for detection and/or diagnosis of SARS-CoV-2 by  FDA under an Emergency Use Authorization (EUA). This EUA will remain  in effect (meaning this test can be used) for the duration of the  Covid-19 declaration under Section 564(b)(1) of the Act, 21  U.S.C. section 360bbb-3(b)(1), unless the authorization is  terminated or revoked. Performed at [facility], [address] [place], [place] [address]     Coagulation Studies: Recent Labs    05/23/19 0530 05/25/2019 0544  LABPROT 34.4* 37.5*  INR 3.4* 3.8*    Urinalysis: No results for input(s): COLORURINE, LABSPEC, PHURINE, GLUCOSEU, HGBUR, BILIRUBINUR, KETONESUR, PROTEINUR, UROBILINOGEN, NITRITE, LEUKOCYTESUR in the last 72 hours.  Invalid input(s): APPERANCEUR    Imaging: No results found.   Medications:   . [MAR Hold] sodium chloride Stopped (05/22/19 2216)  . sodium chloride Stopped (05/12/2019 6045)  . [MAR Hold] albumin human 25 g (05/25/2019 1235)   . [MAR Hold] aspirin EC  81 mg Oral Daily  . [MAR Hold] atorvastatin  40 mg Oral q1800  . [MAR Hold] feeding supplement (ENSURE ENLIVE)  237 mL Oral TID BM  . [MAR Hold] FLUoxetine  20 mg Oral Daily  . [MAR Hold] ipratropium-albuterol  3 mL Nebulization TID   . [MAR Hold] lactulose  10 g Oral BID  . [MAR Hold] magnesium oxide  400 mg Oral BID  . [MAR Hold] Melatonin  2.5 mg Oral QHS  . [MAR Hold] midodrine  5 mg Oral TID WC  . [MAR Hold] mirtazapine  15 mg Oral QHS  . [MAR Hold] multivitamin with minerals  1 tablet Oral Daily  . [MAR Hold] octreotide  100 mcg Subcutaneous TID  . [MAR Hold] pantoprazole  40 mg Oral Daily  . [MAR Hold] patiromer  16.8 g Oral Daily   [MAR Hold] sodium chloride, [MAR Hold] acetaminophen, [MAR Hold] albuterol, [MAR Hold] metoprolol tartrate, [MAR Hold] ondansetron **OR** [MAR Hold] ondansetron (ZOFRAN) IV, [MAR Hold] oxyCODONE  Assessment/ Plan:  59 y.o. male with a PMHx of alcohol abuse, bipolar disorder, congestive heart failure, COPD, hypertension, valvular heart disease, who was admitted to [facility] on 06-05-2019 for evaluation of shortness of breath as well as acute kidney injury.  1.  Acute kidney injury, differential diagnosis between acute tubular necrosis versus hepatorenal syndrome. 2.  Alcoholic hepatitis. 3.  Hyponatremia. 4.  Anemia unspecified. 5.  Hyperkalemia.  Plan: Had a long discussion with the patient today.  Renal function continues to deteriorate as BUN up to 101 and creatinine currently 3.6.  In addition urine output was only 400 cc over the preceding 24 hours and patient was also developed worsening hyperkalemia.  Therefore at this time we have recommended renal replacement therapy in the form of hemodialysis after discussing risk, benefits, and alternatives.  Temporary catheter to be placed by vascular surgery.  Thereafter we will proceed with renal replacement therapy.  Further plan as patient progresses.   LOS: 5 Natalia Wittmeyer 2/15/20213:19 PM

## 2019-05-24 NOTE — Progress Notes (Signed)
Progress Note  Patient Name: [name] Date of Encounter: 05/13/2019  Primary Cardiologist: End  Subjective   Sleepy this morning, poor appetite. No chest pain, dyspnea, palpitations, or dizziness. Not on telemetry this morning. Potassium 5.4-->5.7 (On V[place]assa). BUN/SCr 92/3.71-->101/3.69, sodium 132-->131, albumin 2.8-->3.5, AST 65-->70, T bili 10, INR 3.4-->3.8. Heart rates noted to be in the 90s bpm with BP in there 90s to low 759F systolic.   Inpatient Medications    Scheduled Meds: . aspirin EC  81 mg Oral Daily  . atorvastatin  40 mg Oral q1800  . feeding supplement (ENSURE ENLIVE)  237 mL Oral TID BM  . FLUoxetine  20 mg Oral Daily  . ipratropium-albuterol  3 mL Nebulization TID  . magnesium oxide  400 mg Oral BID  . Melatonin  2.5 mg Oral QHS  . midodrine  5 mg Oral TID WC  . mirtazapine  15 mg Oral QHS  . multivitamin with minerals  1 tablet Oral Daily  . octreotide  100 mcg Subcutaneous TID  . pantoprazole  40 mg Oral Daily  . patiromer  16.8 g Oral Daily  . predniSONE  40 mg Oral QAC breakfast   Continuous Infusions: . sodium chloride Stopped (05/22/19 2216)  . sodium chloride Stopped (05/20/2019 6384)  . albumin human 25 g (05/23/2019 0636)   PRN Meds: sodium chloride, acetaminophen, albuterol, metoprolol tartrate, ondansetron **OR** ondansetron (ZOFRAN) IV, oxyCODONE   Vital Signs    Vitals:   05/23/19 1647 05/23/19 1931 06/03/2019 0347 05/31/2019 0743  BP: (!) 105/55 (!) 109/54 94/60 112/60  Pulse: 93 95 91 90  Resp: 18 16 18 20   Temp: 98.2 F (36.8 C)     TempSrc:      SpO2: 100% 94% 100% 100%  Weight:      Height:        Intake/Output Summary (Last 24 hours) at 06/05/2019 0806 Last data filed at 05/23/2019 0300 Gross per 24 hour  Intake 122.37 ml  Output 400 ml  Net -277.63 ml   Filed Weights   06/02/2019 1944 05/23/19 0410  Weight: 66.7 kg 67.3 kg    Telemetry    Not currently on tele -  Personally Reviewed  ECG    No new tracings-  Personally Reviewed  Physical Exam   Constitutional: Mild distress, sitting up in a chair Head: Normocephalic and atraumatic.  Eyes:  No discharge or scleral icterus.  Neck: Normal range of motion. Neck supple. JVD present 10+.  Cardiovascular: Tachycardic, regular , 3/6 SEM left sternal border, no gallop or friction rub. No edema. Pulmonary: Effort normal and breath sounds normal without stridor, distress, wheezing, rales, or tenderness.  Abdominal: Soft, without distension, or tenderness.  Musculoskeletal: Normal range of motion, without tenderness or deformity.  Neurological:  Normal muscle tone, coordination. No atrophy. Skin: Skin is warm and dry. No rash noted Psychiatric: Normal mood and affect, behavior and thought content.   Labs    Chemistry Recent Labs  Lab 05/22/19 0518 05/23/19 0530 05/30/2019 0544  NA 132* 132* 131*  K 4.6 5.4* 5.7*  CL 94* 92* 92*  CO2 26 20* 20*  GLUCOSE 140* 154* 129*  BUN 73* 92* 101*  CREATININE 3.32* 3.71* 3.69*  CALCIUM 8.6* 8.9 8.8*  PROT 7.1 7.3 7.6  ALBUMIN 2.5* 2.8* 3.5  AST 55* 65* 70*  ALT 28 27 30   ALKPHOS 158* 144* 125  BILITOT 9.5* 10.1* 10.0*  GFRNONAA 19* 17* 17*  GFRAA 22* 19* 20*  ANIONGAP 12 20* 19*     Hematology Recent Labs  Lab 05/21/19 0631 05/22/19 0518 05/23/19 0530  WBC 11.1* 9.2 6.2  RBC 5.53 5.04 5.18  HGB 11.9* 10.9* 11.0*  HCT 37.5* 34.5* 35.2*  MCV 67.8* 68.5* 68.0*  MCH 21.5* 21.6* 21.2*  MCHC 31.7 31.6 31.3  RDW 25.9* 25.3* 26.6*  PLT 97* 91* 83*    Cardiac EnzymesNo results for input(s): TROPONINI in the last 168 hours. No results for input(s): TROPIPOC in the last 168 hours.   BNP Recent Labs  Lab 05/21/2019 1716  BNP 1,566.0*     DDimer No results for input(s): DDIMER in the last 168 hours.   Radiology    CT ABDOMEN PELVIS WO CONTRAST  Result Date: 05/21/2019 IMPRESSION: 1. Diffuse abdominal ascites and diffuse subcutaneous edema compatible with anasarca. 2. Hepatomegaly and  diffuse fatty infiltration of the liver without focal lesion. Irregular hepatic border is similar the prior study, suggesting cirrhosis 3. Punctate nonobstructing stone in the left kidney. 4. Sigmoid diverticulosis without diverticulitis. 5. Aortic Atherosclerosis (ICD10-I70.0). Extensive atherosclerotic disease with probable stenosis in the proximal left iliac artery. 6. Bilateral pleural effusions, right greater than left. 7. Right lower lobe airspace disease concerning for pneumonia. Electronically Signed   By: Marin Roberts M.D.   On: 05/21/2019 13:33   CT CHEST WO CONTRAST  Result Date: 05/20/2019 IMPRESSION: 1. Right pleural effusion associated with dependent lower lobe atelectasis. 2. No evidence of pneumonia.  No pulmonary edema. 3. Mild right upper lobe paraseptal emphysema. 4. Mild enlargement the heart with changes from previous cardiac surgery and aortic and mitral valve replacements. Aortic atherosclerosis. Mild left coronary artery atherosclerosis. 5. Small amount of ascites in the visualized upper abdomen. Aortic Atherosclerosis (ICD10-I70.0) and Emphysema (ICD10-J43.9). Electronically Signed   By: Amie Portland M.D.   On: 05/20/2019 12:38    Cardiac Studies   2D echo 01/2019: 1. Left ventricular ejection fraction, by visual estimation, is 40 to 45%. The left ventricle has mildly decreased function. Left ventricular septal wall thickness was normal. Normal left ventricular posterior wall thickness. There is no left  ventricular hypertrophy.  2. Abnormal septal motion consistent with left bundle branch block.  3. Idiopathic cardiomyopathy.  4. Moderately dilated left ventricular internal cavity size.  5. Global right ventricle has moderately reduced systolic function.The right ventricular size is moderately enlarged. No increase in right ventricular wall thickness.  6. Left atrial size was moderately dilated.  7. Right atrial size was moderately dilated.  8. The mitral  valve is degenerative. Moderate to severe mitral valve regurgitation. Mild mitral stenosis.  9. The tricuspid valve is grossly normal. Tricuspid valve regurgitation moderate-severe.  10. The aortic valve is grossly normal. Aortic valve regurgitation is mild. Mild to moderate aortic valve sclerosis/calcification without any evidence of aortic stenosis.  11. The pulmonic valve was grossly normal. Pulmonic valve regurgitation is mild.  12. Moderately elevated pulmonary artery systolic pressure.  13. Mild ant Akinesis.  14. IVCD.   Patient Profile     59 y.o. 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 Maryfrances Bunnell.  Assessment & Plan    1. New onset atrial flutter with RVR: -Over the past several days, his ventricular rates  have been in the 90s to low 100s bpm -Not currently on telemetry -Overall, this is a difficult situation  -He has not a good anticoagulation candidate secondary to recently reported hemoptysis/hematemesis, thrombocytopenia, elevated INR in the setting of underlying liver disease with ongoing alcohol use, as well as potential for esophageal varices -Not a good candidate for digoxin secondary to ARF  -Not a good candidate for amiodarone secondary to underlying liver disease/cirrhosis  -TEE guided cardioversion is not ideal givenongoing cocaine abuse and underlying comorbid conditions as detailed. This would commit him to anticoagulation for a minimum of 4 weeks afterwards in the setting of the above significant comorbid conditions with high likelihood of recurrent arrhythmia secondary to ongoing alcohol abuse with malnutrition and likely recurrent  electrolyte derangements -In this setting, rate control strategy has been recommended -Fortunately, his rates have been reasonably controlled in the 90s to low 100s bpm -Continue prn metoprolol for sustained ventricular rates in the 110s bpm  2.Chronic combined systolic and diastolic CHF, NYHA class IV: -Likely exacerbated by new onset atrial flutter with RVR, valvular heart disease, alcohol abuse, with some degree of third spacing in the setting of hypoalbuminemia as well as acute renal failure -IV Lasixheld secondary to worsening renal function -Continuelow-dose metoprolol as above as outlined abovewith hold parameters -Not currently a candidate for further GDMT including ACE inhibitor/ARB as well as spironolactone secondary to relative hypotension and ARF -Remains on atorvastatin  3.Valvular heart disease: -He has previously been felt to be a poor candidate for redo valvuloplasty in the setting of medication nonadherence and polysubstance abuse -Medical management  4.Alcoholic/hepatitis C cirrhosis/anasarca: -Patient has ongoing alcohol use with cessation being recommended -High risk of esophageal varices, though none documented on imaging -Urine drug screenpositive for cocaine -Unable to add spironolactone secondary to acute renal failure -His hypoalbuminemia is likely contributing to his third spacing -GI consult consult noted with patient being placed on prednisone -Outlook is quite poor   5.ARF/hyperkalemia: -Nephrology following, they have indicated he might need dialysis -Veltassa  6.Anemia/thrombocytopenia: -Platelets trending downward in the setting of liver failure, climbing INR now 3.8  Dispo: -Overall patient's prognosis is quite poor.  He has been evaluated by palliative care with recommendation for continued level of treatment.  That said, should patient's condition deteriorate requiring advanced life support with intubation he would not want this  to be prolonged greater than 2 weeks.    For questions or updates, please contact [facility] Please consult [email or web address] for contact info under Cardiology/STEMI.    Signed, Dossie Arbour, MD, Ph.D [facility]

## 2019-05-25 ENCOUNTER — Ambulatory Visit: Payer: Medicaid Other | Admitting: Physician Assistant

## 2019-05-25 ENCOUNTER — Encounter: Payer: Self-pay | Admitting: Cardiology

## 2019-05-25 DIAGNOSIS — Z7189 Other specified counseling: Secondary | ICD-10-CM

## 2019-05-25 DIAGNOSIS — I5023 Acute on chronic systolic (congestive) heart failure: Secondary | ICD-10-CM

## 2019-05-25 DIAGNOSIS — I38 Endocarditis, valve unspecified: Secondary | ICD-10-CM

## 2019-05-25 LAB — HEPATIC FUNCTION PANEL
ALT: 37 U/L (ref 0–44)
AST: 113 U/L — ABNORMAL HIGH (ref 15–41)
Albumin: 4.1 g/dL (ref 3.5–5.0)
Alkaline Phosphatase: 112 U/L (ref 38–126)
Bilirubin, Direct: 6.8 mg/dL — ABNORMAL HIGH (ref 0.0–0.2)
Indirect Bilirubin: 4.8 mg/dL — ABNORMAL HIGH (ref 0.3–0.9)
Total Bilirubin: 11.6 mg/dL — ABNORMAL HIGH (ref 0.3–1.2)
Total Protein: 7.6 g/dL (ref 6.5–8.1)

## 2019-05-25 LAB — GLUCOSE, CAPILLARY
Glucose-Capillary: 68 mg/dL — ABNORMAL LOW (ref 70–99)
Glucose-Capillary: 47 mg/dL — ABNORMAL LOW (ref 70–99)
Glucose-Capillary: 125 mg/dL — ABNORMAL HIGH (ref 70–99)
Glucose-Capillary: 104 mg/dL — ABNORMAL HIGH (ref 70–99)
Glucose-Capillary: 69 mg/dL — ABNORMAL LOW (ref 70–99)
Glucose-Capillary: 68 mg/dL — ABNORMAL LOW (ref 70–99)
Glucose-Capillary: 78 mg/dL (ref 70–99)

## 2019-05-25 LAB — BASIC METABOLIC PANEL
Anion gap: 27 — ABNORMAL HIGH (ref 5–15)
BUN: 98 mg/dL — ABNORMAL HIGH (ref 6–20)
CO2: 16 mmol/L — ABNORMAL LOW (ref 22–32)
Calcium: 9 mg/dL (ref 8.9–10.3)
Chloride: 91 mmol/L — ABNORMAL LOW (ref 98–111)
Creatinine, Ser: 3.6 mg/dL — ABNORMAL HIGH (ref 0.61–1.24)
GFR calc Af Amer: 20 mL/min — ABNORMAL LOW (ref >60)
GFR calc non Af Amer: 17 mL/min — ABNORMAL LOW (ref >60)
Glucose, Bld: 70 mg/dL (ref 70–99)
Potassium: 5.6 mmol/L — ABNORMAL HIGH (ref 3.5–5.1)
Sodium: 134 mmol/L — ABNORMAL LOW (ref 135–145)

## 2019-05-25 LAB — CBC
HCT: 32.3 % — ABNORMAL LOW (ref 39.0–52.0)
Hemoglobin: 10 g/dL — ABNORMAL LOW (ref 13.0–17.0)
MCH: 21.6 pg — ABNORMAL LOW (ref 26.0–34.0)
MCHC: 31 g/dL (ref 30.0–36.0)
MCV: 69.9 fL — ABNORMAL LOW (ref 80.0–100.0)
Platelets: 43 10*3/uL — ABNORMAL LOW (ref 150–400)
RBC: 4.62 MIL/uL (ref 4.22–5.81)
RDW: 25.4 % — ABNORMAL HIGH (ref 11.5–15.5)
WBC: 9.6 10*3/uL (ref 4.0–10.5)
nRBC: 10.8 % — ABNORMAL HIGH (ref 0.0–0.2)

## 2019-05-25 LAB — HEPATITIS A ANTIBODY, IGM: Hep A IgM: NONREACTIVE

## 2019-05-25 LAB — HEPATITIS B CORE ANTIBODY, IGM: Hep B C IgM: NONREACTIVE

## 2019-05-25 LAB — HEPATITIS B SURFACE ANTIGEN: Hepatitis B Surface Ag: NONREACTIVE

## 2019-05-25 LAB — PROTIME-INR
INR: 5 (ref 0.8–1.2)
Prothrombin Time: 46.4 seconds — ABNORMAL HIGH (ref 11.4–15.2)

## 2019-05-25 LAB — HEPATITIS C ANTIBODY: HCV Ab: REACTIVE — AB

## 2019-05-25 MED ORDER — NEPRO/CARBSTEADY PO LIQD: 237.0000 mL | Freq: Three times a day (TID) | ORAL | Status: DC

## 2019-05-25 MED ORDER — DEXTROSE 50 % IV SOLN
12.5000 g | INTRAVENOUS | Status: AC
  Administered 2019-05-25: 12.5 g via INTRAVENOUS
  Filled 2019-05-25: qty 50

## 2019-05-25 MED ORDER — DEXTROSE 50 % IV SOLN
INTRAVENOUS | Status: AC
  Filled 2019-05-25: qty 50

## 2019-05-25 MED ORDER — RENA-VITE PO TABS
1.0000 | ORAL_TABLET | Freq: Every day | ORAL | Status: DC
  Filled 2019-05-25 (×2): qty 1

## 2019-05-25 MED ORDER — DEXTROSE 50 % IV SOLN: 1.0000 | Freq: Once | INTRAVENOUS | Status: AC

## 2019-05-25 MED ORDER — METOPROLOL TARTRATE 5 MG/5ML IV SOLN
2.5000 mg | Freq: Once | INTRAVENOUS | Status: AC
  Administered 2019-05-25: 2.5 mg via INTRAVENOUS

## 2019-05-25 MED ORDER — PREDNISONE 20 MG PO TABS: 40.0000 mg | ORAL_TABLET | Freq: Every day | ORAL | Status: DC

## 2019-05-25 MED ORDER — LORAZEPAM 0.5 MG PO TABS
0.5000 mg | ORAL_TABLET | Freq: Four times a day (QID) | ORAL | Status: DC | PRN
  Administered 2019-05-25 (×2): 0.5 mg via ORAL
  Filled 2019-05-25 (×2): qty 1

## 2019-05-25 NOTE — Significant Event (Signed)
Rapid Response Event Note  Overview:      Initial Focused Assessment: Was called to assess patient by dialysis nurse.  Patient HR in 170's afib-within 5 min of her starting dialysis.   Interventions: Dr. Maryfrances Bunnell made aware- 2.5 mg of IV metoprolol given- patient's HR more controlled 110's- 130's.  BP had dropped to 80's systolic due to metoprolol given- BP at this time 94/58 MAP 70. Plan of Care (if not transferred): Dr. Barbera Setters stated that he would talk to Dr. Cherylann Ratel and possibly Dr. Belia Heman- if pt will not be able to tolerate HD and needs CRRT- Have notified bedside RN Amy of update. Per Dr. Barbera Setters- pt to go back to floor at this time since his heart rhythm is patient's normal since he as been admitted and BP stable at this time.  Event Summary:   at      at          [facility]

## 2019-05-25 NOTE — Progress Notes (Signed)
Progress Note  Patient Name: [name] Date of Encounter: 05/25/2019  Primary Cardiologist: End  Subjective   He underwent placement of temporary dialysis catheter on 2/15.  Somnolent on rounds.  Nonconversive.  Rapid response called while patient was in specials with heart rate into the 170s bpm.  He was given IV metoprolol with improvement in heart rate to the low 100s bpm.  With this, BP dropped into the 80s over 50s with current readings in the 90s systolic.  Upon reaching the floor blood sugar was checked and found to be 47 followed by 1 amp of D50.  Labs show continued multiorgan failure.  Inpatient Medications    Scheduled Meds: . atorvastatin  40 mg Oral q1800  . Chlorhexidine Gluconate Cloth  6 each Topical Q0600  . feeding supplement (NEPRO CARB STEADY)  237 mL Oral TID BM  . FLUoxetine  20 mg Oral Daily  . ipratropium-albuterol  3 mL Nebulization TID  . lactulose  10 g Oral BID  . magnesium oxide  400 mg Oral BID  . Melatonin  2.5 mg Oral QHS  . midodrine  5 mg Oral TID WC  . mirtazapine  15 mg Oral QHS  . multivitamin  1 tablet Oral QHS  . multivitamin with minerals  1 tablet Oral Daily  . octreotide  100 mcg Subcutaneous TID  . pantoprazole  40 mg Oral Daily  . patiromer  16.8 g Oral Daily   Continuous Infusions: . sodium chloride Stopped (05/22/19 2216)  . sodium chloride Stopped (06/01/2019 6962)  . sodium chloride    . sodium chloride    . albumin human 25 g (05/14/2019 2117)   PRN Meds: sodium chloride, sodium chloride, sodium chloride, acetaminophen, albuterol, alteplase, heparin, lidocaine (PF), lidocaine-prilocaine, LORazepam, metoprolol tartrate, ondansetron **OR** ondansetron (ZOFRAN) IV, oxyCODONE, pentafluoroprop-tetrafluoroeth   Vital Signs    Vitals:   05/25/19 1234 05/25/19 1239 05/25/19 1248 05/25/19 1316  BP: (!) 88/31 (!) 85/32 (!) 94/58 (!) 93/54  Pulse:    (!) 104  Resp:    20  Temp:    (!) 97.4 F (36.3 C)  TempSrc:    Oral   SpO2:    99%  Weight:      Height:        Intake/Output Summary (Last 24 hours) at 05/25/2019 1347 Last data filed at 05/25/2019 0900 Gross per 24 hour  Intake 1129.23 ml  Output 675 ml  Net 454.23 ml   Filed Weights   05/23/19 0410 05/22/2019 1740 05/29/2019 1918  Weight: 67.3 kg 68.6 kg 68.5 kg    Telemetry    A. fib flutter with ventricular rates in the low 100s bpm- Personally Reviewed  ECG    No new tracings- Personally Reviewed  Physical Exam   GEN:  Somnolent.  No acute distress.   Neck: No JVD. Cardiac:  Mildly tachycardic, irregular, III/VI systolic murmur LSB, no rubs, or gallops.  Respiratory:  Diminished breath sounds to anterior exam bilaterally.  Patient somnolent and too weak to sit up for posterior exam.    GI: Soft, mild diffuse tenderness to palpation, distended.   MS: No edema; No deformity. Neuro:   Somnolent.  Psych: Somnolent.  Labs    Chemistry Recent Labs  Lab 05/23/19 0530 06/02/2019 0544 05/25/19 0724  NA 132* 131* 134*  K 5.4* 5.7* 5.6*  CL 92* 92* 91*  CO2 20* 20* 16*  GLUCOSE 154* 129* 70  BUN 92* 101* 98*  CREATININE 3.71* 3.69*  3.60*  CALCIUM 8.9 8.8* 9.0  PROT 7.3 7.6 7.6  ALBUMIN 2.8* 3.5 4.1  AST 65* 70* 113*  ALT 27 30 37  ALKPHOS 144* 125 112  BILITOT 10.1* 10.0* 11.6*  GFRNONAA 17* 17* 17*  GFRAA 19* 20* 20*  ANIONGAP 20* 19* 27*     Hematology Recent Labs  Lab 05/23/19 0530 05/28/2019 0544 05/25/19 0724  WBC 6.2 6.9 9.6  RBC 5.18 4.97 4.62  HGB 11.0* 10.9* 10.0*  HCT 35.2* 34.0* 32.3*  MCV 68.0* 68.4* 69.9*  MCH 21.2* 21.9* 21.6*  MCHC 31.3 32.1 31.0  RDW 26.6* 25.7* 25.4*  PLT 83* 54* 43*    Cardiac EnzymesNo results for input(s): TROPONINI in the last 168 hours. No results for input(s): TROPIPOC in the last 168 hours.   BNP Recent Labs  Lab 06/06/2019 1716  BNP 1,566.0*     DDimer No results for input(s): DDIMER in the last 168 hours.   Radiology    PERIPHERAL VASCULAR CATHETERIZATION  Result  Date: 05/25/2019 See op note   Cardiac Studies   2D echo 01/2019: 1. Left ventricular ejection fraction, by visual estimation, is 40 to 45%. The left ventricle has mildly decreased function. Left ventricular septal wall thickness was normal. Normal left ventricular posterior wall thickness. There is no left  ventricular hypertrophy.  2. Abnormal septal motion consistent with left bundle branch block.  3. Idiopathic cardiomyopathy.  4. Moderately dilated left ventricular internal cavity size.  5. Global right ventricle has moderately reduced systolic function.The right ventricular size is moderately enlarged. No increase in right ventricular wall thickness.  6. Left atrial size was moderately dilated.  7. Right atrial size was moderately dilated.  8. The mitral valve is degenerative. Moderate to severe mitral valve regurgitation. Mild mitral stenosis.  9. The tricuspid valve is grossly normal. Tricuspid valve regurgitation moderate-severe.  10. The aortic valve is grossly normal. Aortic valve regurgitation is mild. Mild to moderate aortic valve sclerosis/calcification without any evidence of aortic stenosis.  11. The pulmonic valve was grossly normal. Pulmonic valve regurgitation is mild.  12. Moderately elevated pulmonary artery systolic pressure.  13. Mild ant Akinesis.  14. IVCD.   Patient Profile     59 y.o. 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 Loleta Books.  Assessment & Plan    1. New onset atrial flutter/fib with RVR: -Overall, ventricular rates have been reasonably controlled with occasional tachycardic runs requiring as needed metoprolol  -Overall, this is a difficult situation  -He has not a good anticoagulation candidate secondary to recently reported hemoptysis/hematemesis, thrombocytopenia, elevated INR in the setting of underlying liver disease with ongoing alcohol use, as well as potential for esophageal varices -Not a good candidate for digoxin secondary to ARF -Not a good candidate for amiodarone secondary to underlying liver disease/cirrhosis  -TEE guided cardioversion is not ideal givenongoing cocaine abuse and underlying comorbid conditions as detailed. This would commit him to anticoagulation for a minimum of 4 weeks afterwards in the setting of the above significant comorbid conditions with high likelihood of recurrent arrhythmia secondary to ongoing alcohol abuse with malnutrition and likely recurrent electrolyte derangements -In this setting,rate control strategy has been recommended -Continue prn metoprolol for sustained ventricular rates  in the 110s bpm  2.Chronic combined systolic and diastolic CHF, NYHA class IV: -Likely exacerbated by new onset atrial flutter with RVR, valvular heart disease, alcohol abuse, with some degree of third spacing in the setting of hypoalbuminemiaas well as acute renal failure -IV Lasixheld secondary to worsening renal function -Continuelow-dose metoprololas aboveas outlined abovewith hold parameters -Not currently a candidate for further GDMT including ACE inhibitor/ARB as well as spironolactone secondary to relative hypotension and ARF -Remains on atorvastatin  3.Valvular heart disease: -He has previously been felt to be a poor candidate for redo valvuloplasty in the setting of medication nonadherence and polysubstance abuse -Medical management  4.Hepatic encephalopathy/alcoholic cirrhosis/hepatitis C cirrhosis/anasarca: -Patient has ongoing alcohol use with cessation being recommended -High risk of esophageal varices, though none documented on imaging -Urine  drug screenpositive for cocaine -Unable to add spironolactone secondary to acute renal failure -His hypoalbuminemia is likely contributing to his third spacing -GI consultconsult noted with patient being placed on prednisone -Outlook is quite poor   5.ARF/hyperkalemia: -Status post temporary dialysis catheter on 2/15 -Nephrology considering CRRT -Veltassa  6.Anemia/thrombocytopenia: -Hemoglobin and platelet count continue to downtrend  Dispo: -Overall patient's prognosis is very poor. He continues to show signs of progressive multiorgan failure.  He has been evaluated by palliative care with recommendation for continued level of treatment. That said, should patient's condition deteriorate requiring advanced life support with intubation he would not want this to be prolonged greater than 2 weeks.  For questions or updates, please contact [facility] Please consult [email or web address] for contact info under Cardiology/STEMI.    Signed, Eula Listen, PA-C [facility] Pager: [phone] 05/25/2019, 1:47 PM

## 2019-05-25 NOTE — Progress Notes (Signed)
Palliative: [name] is resting quietly in bed.  He appears acutely/chronically ill and frail.  He will wake when I call his name and make but not keep eye contact.  He is oriented to person and place, situation.  He is able to make his basic needs known.  There is no family at bedside at this time.  Nursing staff present during conversation.  [name] and I talked about his dialysis session, his ability to tolerate dialysis.  I share your concern about his worsening health status.  I ask if he should be placed on life support.  [name] clearly states, "no".  During our previous conversation last week [name] had stated that he would want a limited time trial of life support, no more than 2 weeks.  Dawson to reach out to family for goalsetting assistance, as [name] is clearly in a fragile state.  Conference with attending and bedside nursing staff related to patient condition, needs.  Legal decision maker would be, in order, parents, adult children, siblings.  Call to mother, [name] at [phone].  No answer, unable to leave VM.    Call to sister, [name] at [phone],  recording states, "the number you are calling is not accepting your call, please hang up". Call to sister [name], at [phone].  Recording states "calls to this number being monitored by Call blocker, not accepting your call".    Call to mother's #[phone], spoke with [name]'s brother [name].  He shares that [name] fell, broke her leg and is now in a nursing home/rehab.  He shares that she is clearheaded and can have conversation, gives phone #[phone], but states that staff would have to bring her the phone.  I request for [name] to reach out to his sister [name], encouraged her to call the palliative team.  Phone number provided. I share with [name] that [name] is worsening, not tolerating dialysis due to increased heart rate decreased blood pressure.  I share my concern that if he is unable to tolerate dialysis with  liver and kidney failure, time is likely short. We talked about CODE STATUS, [name]'s decision to be DNR.  I ask if family could support that.  [name] states, "if that is what he wants".  [name] also gives a second # for [name] [name]) [phone].   Call from [name] phone, message relayed.  Call to [name] at requested number, [phone], but again states call blocker is not accepting the call. Second call attempted, same response.   Call to [phone], spoke with [name].  [name] shares that she has seen a decline in her brother [name] for some time.  She tells me that he has not been losing fluid as fast as she thought he should.  [name] shares, "his body is shutting down".  We talked about his acute and chronic health problems and the treatment plan.  We talked about his intolerance of hemodialysis.  I share that [name] had stated he wanted no life support, [name] agrees stating that they saw their sister struggle on a ventilator. Family agrees with DNR status.  [name] tells me that she spoke with [name] about hospice care yesterday.  She shares that he cannot go home because there is no one to care for him there.  [name] tells me that if he worsens they are agreeable to residential hospice care.  I reassure [name] that we will continue to care  for [name],  his body, modern medicine and God's will, for outcomes.   Dawson to follow tomorrow.   Plan:   Treat the treatable but no CPR, no intubation.  Continue to attempt dialysis as tolerated.  65 minutes, extended time.  Mike Carmel, NP Palliative Medicine Team Team Phone # [phone] Greater than 50% of this time was spent counseling and coordinating care related to the above assessment and plan.

## 2019-05-25 NOTE — Progress Notes (Signed)
CRITICAL VALUE ALERT  Critical Value:  PT: 46.4, INR 4.98  Date & Time Notied:  05/25/2019 8:21 AM  Provider Notified: [id number]  Orders Received/Actions taken: no new orders

## 2019-05-25 NOTE — Progress Notes (Signed)
CRITICAL VALUE ALERT  Critical Value:  FSBG 47  Date & Time Notied:  05/25/2019 1:25 PM   Provider Notified: [id number]  Orders Received/Actions taken: 1 amp of d50.  Will continue to monitor

## 2019-05-25 NOTE — Progress Notes (Signed)
PROGRESS NOTE    [name]  TOI:[id number] DOB: 11/15/1960 DOA: 05/20/2019 PCP: [facility]      Brief Narrative:  [name] is a 59 y.o. 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  Went to see his Cardiologist prior to admission, was tachypneic, tachycardic and had hypokalemia and fluid overload, sent to ER for diuresis.  In the ER, hypertensive, pulse 114, WBC 6K, platelets 91K, creatinine 1.8, procalcitonin 0.39, lactate 3, Covid negative, chest x-ray with pneumonia versus edema with small effusions.  Started on empiric antibiotics and admitted.           Assessment & Plan:   Decompensated alcoholic hep C cirrhosis Ongoing alcohol use, variable reports, per GI, 80-120 oz beer daily.  MELD >30.  Discriminant >40 at admission.  Moderate ascites on exam and by CT imaging and so admitted and started on cautious diuretics and renal function started to worsen.    Diuretics held, nephrology and GI consulted, started on albumin, midodrine and octreotide for HRS  Cr has stabilized but INR trending up, Tbili still >9, K elevated, severe fluid overload.   -Continue prednisone, day 4 -Continue lactulose  -Calculate Lille score on Feb 17 to continue steroids or not  -Consult Palliative Care -- patient expressed wishes for DNR status today to Palliative Care and nursing, CODE STATUS changed.  -Consult GI re: steroids, liver failure, risk of anticoagulation and risk of TEE -Consult Nephrology re: possible HRS, utility of splanchnic vasoconstrictors          Acute renal failure Hyperkalemia Cr baseline 1.2.  Cr stable around 3.5 today.  Suspect HRS. Femoral line placed yesteerday.  Today patient went for HD at noon, after 5 minutes trial, he became tachycardic to 170s in Afib and  HR dropped to 80s.    HD stopped.  Nephrology may trial again tomorrow.  -Continue albumin, midodrine, octreotide -Continue veltassa -Continue very cautious IV fluids, which have helped renal function (not taking much food by mouth)  Atrial flutter, typical New onset this hospitalization Not anticoagulation candidate at present.  HR improved to 80s yesterday, today severely tachycardic with dialysis. -Continue PRN metoprolol for HR >110   Possible pneumonia Procalcitonin 0.38, pneumonia possible by chest x-ray. Completed 5 days azithromycin and ceftriaxone  Acute on chronic systolic and diastolic CHF History of aortic valve repair, twice (1990, 2000) History of mitral valve repair (1990, now with moderate mitral regurgitation and stenosis) Admitted with BNP >1500, effusions on exam.  Per PCP notes, dry weight 145 pounds, but clearly fluid overloaded on exam due to CHF and liver failure.  Diuretics started initially, subsequently held due to worsening renal function.  EF 40 to 45% last October.  -Hold diuretics -Strict I/Os, daily weights, telemetry  -Daily monitoring renal function -Lisinopril, spironolactone contraindicated -Hold atorvastatin, aspirin   COPD No wheezing to suggest active bronchospasm -Continue home LABA/LAMA   Alcohol use No real symptoms of withdrawal at present.  Oky to stop CIWA scoring. -Continue thiamine and folate  Thrombocytopenia Trending down.  Fem cath oozing.     GERD -Continue pantoprazole   Anemia of chronic disease Hemoglobin stable   Depression -Continue fluoxetine, mirtazapine  Moderate protein calorie malnutrition As evidenced by severe chronic disease, poor oral intake here in the hospital, and diffuse loss of subcutaneous muscle mass  and fat -Consult nutrition         Disposition: The patient was admitted with malaise, found to have combined heart, liver, and renal failure.  His liver and renal failure worsens.   Trial of dialysis failed today.  Nephrology are uncertain if he will be able to tolerate tomorrow.  All temporizing measures to continue, but ongoing goals of care discussions are being had.  Made DNR today.  No other changes to current management.  He has a very dire prognosis.             MDM: The below labs and imaging reports reviewed and summarized above.  Medication management as above.    DVT prophylaxis: SCDs Code Status: Full code Family Communication:      Consultants:   Cardiology  GI  Paliative Care  Nephrology   Procedures:   2/10 chest x-ray-right lower lobe opacity, possible pneumonia  2/11 CT chest-effusion, no pneumonia or mass  2/12 CT abdomen -- ascites  Antimicrobials:   Ceftriaxone and azithromycin 2/11>>  Culture data:   2/10 blood culture x2-no growth           Subjective: Patient is anxious.  He is somewhat dyspneic.  Mentation is worsening.  No vomiting, hematochezia, hematemesis.  Still ascites.  Tachycardia with dialysis.     Objective: Vitals:   05/25/19 1231 05/25/19 1234 05/25/19 1239 05/25/19 1248  BP: (!) 85/47 (!) 88/31 (!) 85/32 (!) 94/58  Pulse:      Resp:      Temp:      TempSrc:      SpO2:      Weight:      Height:        Intake/Output Summary (Last 24 hours) at 05/25/2019 1256 Last data filed at 05/25/2019 0900 Gross per 24 hour  Intake 1129.23 ml  Output 675 ml  Net 454.23 ml   Filed Weights   05/23/19 0410 05/26/2019 1740 05/11/2019 1918  Weight: 67.3 kg 68.6 kg 68.5 kg    Examination: General appearance: thin adult male, less alert and in moderate distress.   HEENT: Anicteric, conjunctiva pink, lids and lashes normal. No nasal deformity, discharge, epistaxis.    Skin: Warm and dry.  No suspicious rashes or lesions. Cardiac: Tachycardic, regular, no murmurs appreciated.  1+ LE edema.    Respiratory: Tachpyneic.  CTAB without rales or wheezes. Abdomen: Abdomen with ascites, distension,  hepatosplenomegaly.   MSK: No deformities or effusions of the large joints of the upper or lower extremities bilaterally. Neuro: Less alert.  Sometimes responds to questions. Moves extremities weakly.  Speech fluent. Psych:Attention diminshed.  Affect blunted.             Data Reviewed: I have personally reviewed following labs and imaging studies:  CBC: Recent Labs  Lab 06-07-2019 1716 2019-06-07 1716 2019-06-07 2018 June 07, 2019 2329 05/21/19 0631 05/22/19 0518 05/23/19 0530 06/03/2019 0544 05/25/19 0724  WBC 7.8   < > 6.7   < > 11.1* 9.2 6.2 6.9 9.6  NEUTROABS 5.8  --  5.3  --   --   --   --   --   --   HGB 11.9*   < > 11.8*   < > 11.9* 10.9* 11.0* 10.9* 10.0*  HCT 37.3*   < > 37.9*   < > 37.5* 34.5* 35.2* 34.0* 32.3*  MCV 66.8*   < > 68.5*   < > 67.8* 68.5* 68.0* 68.4* 69.9*  PLT 93*   < > 91*   < >  97* 91* 83* 54* 43*   < > = values in this interval not displayed.   Basic Metabolic Panel: Recent Labs  Lab 06/04/2019 2018 06/02/2019 2018 05/29/2019 2329 05/20/19 9371 05/21/19 0631 05/22/19 0518 05/23/19 0530 06/03/2019 0544 05/30/2019 1630 05/25/19 0724  NA 137   < > 136   < > 137 132* 132* 131*  --  134*  K 2.7*   < > 2.8*   < > 4.8 4.6 5.4* 5.7*  --  5.6*  CL 90*   < > 94*   < > 95* 94* 92* 92*  --  91*  CO2 33*   < > 28   < > 23 26 20* 20*  --  16*  GLUCOSE 223*   < > 178*   < > 46* 140* 154* 129*  --  70  BUN 39*   < > 39*   < > 54* 73* 92* 101*  --  98*  CREATININE 1.79*   < > 1.74*   < > 2.43* 3.32* 3.71* 3.69*  --  3.60*  CALCIUM 8.4*   < > 8.0*   < > 9.0 8.6* 8.9 8.8*  --  9.0  MG 1.6*  --  1.6*  --  2.2  --   --   --   --   --   PHOS  --   --  2.4*  --   --   --   --   --  7.4*  --    < > = values in this interval not displayed.   GFR: Estimated Creatinine Clearance: 21.4 mL/min (A) (by C-G formula based on SCr of 3.6 mg/dL (H)). Liver Function Tests: Recent Labs  Lab 05/21/19 0631 05/22/19 0518 05/23/19 0530 05/28/2019 0544 05/25/19 0724  AST 65* 55* 65*  70* 113*  ALT 31 28 27 30  37  ALKPHOS 165* 158* 144* 125 112  BILITOT 10.4* 9.5* 10.1* 10.0* 11.6*  PROT 7.7 7.1 7.3 7.6 7.6  ALBUMIN 2.6* 2.5* 2.8* 3.5 4.1   No results for input(s): LIPASE, AMYLASE in the last 168 hours. Recent Labs  Lab 05/25/2019 2019  AMMONIA 25   Coagulation Profile: Recent Labs  Lab 05/18/2019 2018 05/21/19 0921 05/23/19 0530 05/12/2019 0544 05/25/19 0724  INR 1.7* 2.5* 3.4* 3.8* 5.0*   Cardiac Enzymes: No results for input(s): CKTOTAL, CKMB, CKMBINDEX, TROPONINI in the last 168 hours. BNP (last 3 results) No results for input(s): PROBNP in the last 8760 hours. HbA1C: No results for input(s): HGBA1C in the last 72 hours. CBG: Recent Labs  Lab 05/23/19 1648 05/12/2019 0746 05/31/2019 1123 05/29/2019 1606 05/25/19 0806  GLUCAP 197* 130* 148* 111* 68*   Lipid Profile: No results for input(s): CHOL, HDL, LDLCALC, TRIG, CHOLHDL, LDLDIRECT in the last 72 hours. Thyroid Function Tests: No results for input(s): TSH, T4TOTAL, FREET4, T3FREE, THYROIDAB in the last 72 hours. Anemia Panel: No results for input(s): VITAMINB12, FOLATE, FERRITIN, TIBC, IRON, RETICCTPCT in the last 72 hours. Urine analysis:    Component Value Date/Time   COLORURINE AMBER (A) 06/02/2019 2222   APPEARANCEUR CLEAR (A) 05/17/2019 2222   APPEARANCEUR Clear 12/25/2013 0243   LABSPEC 1.009 05/22/2019 2222   LABSPEC 1.002 12/25/2013 0243   PHURINE 6.0 05/22/2019 2222   GLUCOSEU NEGATIVE 05/13/2019 2222   GLUCOSEU Negative 12/25/2013 0243   HGBUR NEGATIVE 06/01/2019 2222   BILIRUBINUR NEGATIVE 05/22/2019 2222   BILIRUBINUR Negative 12/25/2013 0243   KETONESUR NEGATIVE 05/26/2019 2222   PROTEINUR NEGATIVE 05/15/2019  2222   NITRITE NEGATIVE 05/21/2019 2222   LEUKOCYTESUR NEGATIVE 05/18/2019 2222   LEUKOCYTESUR Negative 12/25/2013 0243   Sepsis Labs: @LABRCNTIP (procalcitonin:4,lacticacidven:4)  ) Recent Results (from the past 240 hour(s))  Blood culture (routine x 2)     Status:  None   Collection Time: 05/30/2019  8:18 PM   Specimen: BLOOD  Result Value Ref Range Status   Specimen Description BLOOD RIGHT FOREARM  Final   Special Requests   Final    BOTTLES DRAWN AEROBIC AND ANAEROBIC Blood Culture adequate volume   Culture   Final    NO GROWTH 5 DAYS Performed at [facility], [address], [place], [place]    Report Status 05/20/2019 FINAL  Final  Blood culture (routine x 2)     Status: None   Collection Time: 06/04/2019  8:18 PM   Specimen: BLOOD  Result Value Ref Range Status   Specimen Description BLOOD RIGHT ANTECUBITAL  Final   Special Requests   Final    BOTTLES DRAWN AEROBIC AND ANAEROBIC Blood Culture adequate volume   Culture   Final    NO GROWTH 5 DAYS Performed at [facility], [address] [place], [place]    Report Status 06/03/2019 FINAL  Final  Respiratory Panel by RT PCR (Flu A&B, Covid) - Nasopharyngeal Swab     Status: None   Collection Time: 05/31/2019  8:19 PM   Specimen: Nasopharyngeal Swab  Result Value Ref Range Status   SARS Coronavirus 2 by RT PCR NEGATIVE NEGATIVE Final    Comment: (NOTE) SARS-CoV-2 target nucleic acids are NOT DETECTED. The SARS-CoV-2 RNA is generally detectable in upper respiratoy specimens during the acute phase of infection. The lowest concentration of SARS-CoV-2 viral copies this assay can detect is 131 copies/mL. A negative result does not preclude SARS-Cov-2 infection and should not be used as the sole basis for treatment or other patient management decisions. A negative result may occur with  improper specimen collection/handling, submission of specimen other than nasopharyngeal swab, presence of viral mutation(s) within the areas targeted by this assay, and inadequate number of viral copies (<131 copies/mL). A negative result must be combined with clinical observations, patient history, and epidemiological information. The expected result is Negative. Fact  Sheet for Patients:  07/17/19 Fact Sheet for Healthcare Providers:  https://www.moore.com/ This test is not yet ap proved or cleared by the https://www.young.biz/ FDA and  has been authorized for detection and/or diagnosis of SARS-CoV-2 by FDA under an Emergency Use Authorization (EUA). This EUA will remain  in effect (meaning this test can be used) for the duration of the COVID-19 declaration under Section 564(b)(1) of the Act, 21 U.S.C. section 360bbb-3(b)(1), unless the authorization is terminated or revoked sooner.    Influenza A by PCR NEGATIVE NEGATIVE Final   Influenza B by PCR NEGATIVE NEGATIVE Final    Comment: (NOTE) The Xpert Xpress SARS-CoV-2/FLU/RSV assay is intended as an aid in  the diagnosis of influenza from Nasopharyngeal swab specimens and  should not be used as a sole basis for treatment. Nasal washings and  aspirates are unacceptable for Xpert Xpress SARS-CoV-2/FLU/RSV  testing. Fact Sheet for Patients: [place] Fact Sheet for Healthcare Providers: https://www.moore.com/ This test is not yet approved or cleared by the https://www.young.biz/ FDA and  has been authorized for detection and/or diagnosis of SARS-CoV-2 by  FDA under an Emergency Use Authorization (EUA). This EUA will remain  in effect (meaning this test can be used) for the  duration of the  Covid-19 declaration under Section 564(b)(1) of the Act, 21  U.S.C. section 360bbb-3(b)(1), unless the authorization is  terminated or revoked. Performed at [facility], [address], [place], [place] [address]          Radiology Studies: PERIPHERAL VASCULAR CATHETERIZATION  Result Date: 05/25/2019 See op note       Scheduled Meds: . atorvastatin  40 mg Oral q1800  . Chlorhexidine Gluconate Cloth  6 each Topical Q0600  . feeding supplement (NEPRO CARB STEADY)  237 mL Oral TID BM  . FLUoxetine  20  mg Oral Daily  . ipratropium-albuterol  3 mL Nebulization TID  . lactulose  10 g Oral BID  . magnesium oxide  400 mg Oral BID  . Melatonin  2.5 mg Oral QHS  . midodrine  5 mg Oral TID WC  . mirtazapine  15 mg Oral QHS  . multivitamin  1 tablet Oral QHS  . multivitamin with minerals  1 tablet Oral Daily  . octreotide  100 mcg Subcutaneous TID  . pantoprazole  40 mg Oral Daily  . patiromer  16.8 g Oral Daily   Continuous Infusions: . sodium chloride Stopped (05/22/19 2216)  . sodium chloride Stopped (06/02/2019 8366)  . sodium chloride    . sodium chloride    . albumin human 25 g (06/01/2019 2117)     LOS: 6 days    Time spent: 35 minutes    Alberteen Sam, MD [facility] 05/25/2019, 12:56 PM     Please page though AMION or Epic secure chat:  For [employer], Higher education careers adviser

## 2019-05-25 NOTE — Progress Notes (Signed)
[facility]  ROUNDING NOTE   Subjective:  Patient underwent first dialysis treatment yesterday. Still appears quite ill. Urine output was documented as 525 cc over the preceding 24 hours. Potassium still a bit high at 5.6. Also appears to have ongoing metabolic acidosis.    Objective:  Vital signs in last 24 hours:  Temp:  [97.4 F (36.3 C)-98.5 F (36.9 C)] 98.5 F (36.9 C) (02/16 0805) Pulse Rate:  [61-115] 62 (02/16 0805) Resp:  [16-20] 18 (02/16 0805) BP: (96-119)/(49-76) 105/68 (02/16 0805) SpO2:  [95 %-100 %] 98 % (02/16 0805) Weight:  [68.5 kg-68.6 kg] 68.5 kg (02/15 1918)  Weight change:  Filed Weights   05/23/19 0410 05/17/2019 1740 05/30/2019 1918  Weight: 67.3 kg 68.6 kg 68.5 kg    Intake/Output: I/O last 3 completed shifts: In: 1011.6 [P.O.:120; I.V.:428.7; IV Piggyback:462.9] Out: 925 [Urine:925]   Intake/Output this shift:  Total I/O In: 240 [P.O.:240] Out: 150 [Urine:150]  Physical Exam: General: No acute distress  Head: Normocephalic, atraumatic. Moist oral mucosal membranes  Eyes: Anicteric  Neck: Supple, trachea midline  Lungs:  Clear to auscultation, normal effort  Heart: S1S2 no rubs  Abdomen:  Soft, distension noted  Extremities: trace peripheral edema.  Neurologic: Awake, alert, following commands  Skin: No lesions       Basic Metabolic Panel: Recent Labs  Lab 05-Jun-2019 2018 06-05-2019 2018 2019/06/05 2329 05/20/19 3953 05/21/19 0631 05/21/19 0631 05/22/19 0518 05/22/19 0518 05/23/19 0530 05/23/2019 0544 05/23/2019 1630 05/25/19 0724  NA 137   < > 136   < > 137  --  132*  --  132* 131*  --  134*  K 2.7*   < > 2.8*   < > 4.8  --  4.6  --  5.4* 5.7*  --  5.6*  CL 90*   < > 94*   < > 95*  --  94*  --  92* 92*  --  91*  CO2 33*   < > 28   < > 23  --  26  --  20* 20*  --  16*  GLUCOSE 223*   < > 178*   < > 46*  --  140*  --  154* 129*  --  70  BUN 39*   < > 39*   < > 54*  --  73*  --  92* 101*  --  98*  CREATININE 1.79*   <  > 1.74*   < > 2.43*  --  3.32*  --  3.71* 3.69*  --  3.60*  CALCIUM 8.4*   < > 8.0*   < > 9.0   < > 8.6*   < > 8.9 8.8*  --  9.0  MG 1.6*  --  1.6*  --  2.2  --   --   --   --   --   --   --   PHOS  --   --  2.4*  --   --   --   --   --   --   --  7.4*  --    < > = values in this interval not displayed.    Liver Function Tests: Recent Labs  Lab 05/21/19 0631 05/22/19 0518 05/23/19 0530 05/13/2019 0544 05/25/19 0724  AST 65* 55* 65* 70* 113*  ALT 31 28 27 30  37  ALKPHOS 165* 158* 144* 125 112  BILITOT 10.4* 9.5* 10.1* 10.0* 11.6*  PROT 7.7 7.1 7.3 7.6 7.6  ALBUMIN 2.6* 2.5* 2.8* 3.5 4.1   No results for input(s): LIPASE, AMYLASE in the last 168 hours. Recent Labs  Lab 05/17/2019 2019  AMMONIA 25    CBC: Recent Labs  Lab 05/17/2019 1716 05/13/2019 1716 05/26/2019 2018 05/28/2019 2329 05/21/19 0631 05/22/19 0518 05/23/19 0530 May 28, 2019 0544 05/25/19 0724  WBC 7.8   < > 6.7   < > 11.1* 9.2 6.2 6.9 9.6  NEUTROABS 5.8  --  5.3  --   --   --   --   --   --   HGB 11.9*   < > 11.8*   < > 11.9* 10.9* 11.0* 10.9* 10.0*  HCT 37.3*   < > 37.9*   < > 37.5* 34.5* 35.2* 34.0* 32.3*  MCV 66.8*   < > 68.5*   < > 67.8* 68.5* 68.0* 68.4* 69.9*  PLT 93*   < > 91*   < > 97* 91* 83* 54* 43*   < > = values in this interval not displayed.    Cardiac Enzymes: No results for input(s): CKTOTAL, CKMB, CKMBINDEX, TROPONINI in the last 168 hours.  BNP: Invalid input(s): POCBNP  CBG: Recent Labs  Lab 05/23/19 1648 05-28-2019 0746 2019-05-28 1123 2019/05/28 1606 05/25/19 0806  GLUCAP 197* 130* 148* 111* 68*    Microbiology: Results for orders placed or performed during the hospital encounter of 05/23/2019  Blood culture (routine x 2)     Status: None   Collection Time: 05/26/2019  8:18 PM   Specimen: BLOOD  Result Value Ref Range Status   Specimen Description BLOOD RIGHT FOREARM  Final   Special Requests   Final    BOTTLES DRAWN AEROBIC AND ANAEROBIC Blood Culture adequate volume   Culture    Final    NO GROWTH 5 DAYS Performed at [facility], [address] [place], [place] [address]    Report Status 28-May-2019 FINAL  Final  Blood culture (routine x 2)     Status: None   Collection Time: 05/18/2019  8:18 PM   Specimen: BLOOD  Result Value Ref Range Status   Specimen Description BLOOD RIGHT ANTECUBITAL  Final   Special Requests   Final    BOTTLES DRAWN AEROBIC AND ANAEROBIC Blood Culture adequate volume   Culture   Final    NO GROWTH 5 DAYS Performed at [facility], [address] [place], [place] [address]    Report Status 05-28-2019 FINAL  Final  Respiratory Panel by RT PCR (Flu A&B, Covid) - Nasopharyngeal Swab     Status: None   Collection Time: 06/03/2019  8:19 PM   Specimen: Nasopharyngeal Swab  Result Value Ref Range Status   SARS Coronavirus 2 by RT PCR NEGATIVE NEGATIVE Final    Comment: (NOTE) SARS-CoV-2 target nucleic acids are NOT DETECTED. The SARS-CoV-2 RNA is generally detectable in upper respiratoy specimens during the acute phase of infection. The lowest concentration of SARS-CoV-2 viral copies this assay can detect is 131 copies/mL. A negative result does not preclude SARS-Cov-2 infection and should not be used as the sole basis for treatment or other patient management decisions. A negative result may occur with  improper specimen collection/handling, submission of specimen other than nasopharyngeal swab, presence of viral mutation(s) within the areas targeted by this assay, and inadequate number of viral copies (<131 copies/mL). A negative result must be combined with clinical observations, patient history, and epidemiological information. The expected result is Negative. Fact Sheet for Patients:  https://www.moore.com/ Fact Sheet for  Healthcare Providers:  https://www.young.biz/ This test is not yet ap proved or cleared by the [place] and  has been authorized for detection  and/or diagnosis of SARS-CoV-2 by FDA under an Emergency Use Authorization (EUA). This EUA will remain  in effect (meaning this test can be used) for the duration of the COVID-19 declaration under Section 564(b)(1) of the Act, 21 U.S.C. section 360bbb-3(b)(1), unless the authorization is terminated or revoked sooner.    Influenza A by PCR NEGATIVE NEGATIVE Final   Influenza B by PCR NEGATIVE NEGATIVE Final    Comment: (NOTE) The Xpert Xpress SARS-CoV-2/FLU/RSV assay is intended as an aid in  the diagnosis of influenza from Nasopharyngeal swab specimens and  should not be used as a sole basis for treatment. Nasal washings and  aspirates are unacceptable for Xpert Xpress SARS-CoV-2/FLU/RSV  testing. Fact Sheet for Patients: https://www.moore.com/ Fact Sheet for Healthcare Providers: https://www.young.biz/ This test is not yet approved or cleared by the [place] FDA and  has been authorized for detection and/or diagnosis of SARS-CoV-2 by  FDA under an Emergency Use Authorization (EUA). This EUA will remain  in effect (meaning this test can be used) for the duration of the  Covid-19 declaration under Section 564(b)(1) of the Act, 21  U.S.C. section 360bbb-3(b)(1), unless the authorization is  terminated or revoked. Performed at [facility], [address] [place], [place] [address]     Coagulation Studies: Recent Labs    05/23/19 0530 May 31, 2019 0544 05/25/19 0724  LABPROT 34.4* 37.5* 46.4*  INR 3.4* 3.8* 5.0*    Urinalysis: No results for input(s): COLORURINE, LABSPEC, PHURINE, GLUCOSEU, HGBUR, BILIRUBINUR, KETONESUR, PROTEINUR, UROBILINOGEN, NITRITE, LEUKOCYTESUR in the last 72 hours.  Invalid input(s): APPERANCEUR    Imaging: PERIPHERAL VASCULAR CATHETERIZATION  Result Date: 05/25/2019 See op note    Medications:   . sodium chloride Stopped (05/22/19 2216)  . sodium chloride Stopped (05/31/19 1856)  . sodium  chloride    . sodium chloride    . albumin human 25 g (2019/05/31 2117)   . atorvastatin  40 mg Oral q1800  . Chlorhexidine Gluconate Cloth  6 each Topical Q0600  . feeding supplement (NEPRO CARB STEADY)  237 mL Oral TID BM  . FLUoxetine  20 mg Oral Daily  . ipratropium-albuterol  3 mL Nebulization TID  . lactulose  10 g Oral BID  . magnesium oxide  400 mg Oral BID  . Melatonin  2.5 mg Oral QHS  . midodrine  5 mg Oral TID WC  . mirtazapine  15 mg Oral QHS  . multivitamin  1 tablet Oral QHS  . multivitamin with minerals  1 tablet Oral Daily  . octreotide  100 mcg Subcutaneous TID  . pantoprazole  40 mg Oral Daily  . patiromer  16.8 g Oral Daily   sodium chloride, sodium chloride, sodium chloride, acetaminophen, albuterol, alteplase, heparin, lidocaine (PF), lidocaine-prilocaine, LORazepam, metoprolol tartrate, ondansetron **OR** ondansetron (ZOFRAN) IV, oxyCODONE, pentafluoroprop-tetrafluoroeth  Assessment/ Plan:  59 y.o. male with a PMHx of alcohol abuse, bipolar disorder, congestive heart failure, COPD, hypertension, valvular heart disease, who was admitted to [facility] on 05/10/2019 for evaluation of shortness of breath as well as acute kidney injury.  1.  Acute kidney injury, differential diagnosis between acute tubular necrosis versus hepatorenal syndrome. 2.  Alcoholic hepatitis. 3.  Hyponatremia. 4.  Anemia unspecified. 5.  Hyperkalemia.  Plan: Patient underwent first dialysis treatment yesterday.  We plan to perform second dialysis treatment today.  This should hopefully improve  his hyperkalemia, metabolic acidosis, and azotemia to a better extent.  He has ongoing liver dysfunction and bilirubin rising and currently 11.6.  His overall prognosis remains quite guarded.   LOS: 6 Bronte Kropf 2/16/202111:29 AM

## 2019-05-25 NOTE — Progress Notes (Signed)
Jonathon Bellows , MD [address], [place], [place], [place], [address] [address] [place], [place], [place], [address] Phone: [phone]  Fax: [phone]   [name] is being followed for acute alcoholic hepatitis, liver failure and renal failure   Subjective: Patient very drowsy and unable to give any history    Objective: Vital signs in last 24 hours: Vitals:   05/31/2019 2012 05/25/19 0018 05/25/19 0318 05/25/19 0805  BP:  (!) 96/53 109/60 105/68  Pulse:  61 61 62  Resp:    18  Temp:  (!) 97.4 F (36.3 C)  98.5 F (36.9 C)  TempSrc:  Oral  Oral  SpO2: 96% 95% 100% 98%  Weight:      Height:       Weight change:   Intake/Output Summary (Last 24 hours) at 05/25/2019 1014 Last data filed at 05/25/2019 0900 Gross per 24 hour  Intake 1129.23 ml  Output 675 ml  Net 454.23 ml     Exam: Heart:: Regular rate and rhythm, S1S2 present or without murmur or extra heart sounds Lungs: normal, clear to auscultation and clear to auscultation and percussion Abdomen: soft, nontender, normal bowel sounds   Lab Results: @LABTEST2 @ Micro Results: Recent Results (from the past 240 hour(s))  Blood culture (routine x 2)     Status: None   Collection Time: 06/08/2019  8:18 PM   Specimen: BLOOD  Result Value Ref Range Status   Specimen Description BLOOD RIGHT FOREARM  Final   Special Requests   Final    BOTTLES DRAWN AEROBIC AND ANAEROBIC Blood Culture adequate volume   Culture   Final    NO GROWTH 5 DAYS Performed at [facility], [address] [place], [place] [address]    Report Status 06/05/2019 FINAL  Final  Blood culture (routine x 2)     Status: None   Collection Time: 08-Jun-2019  8:18 PM   Specimen: BLOOD  Result Value Ref Range Status   Specimen Description BLOOD RIGHT ANTECUBITAL  Final   Special Requests   Final    BOTTLES DRAWN AEROBIC AND ANAEROBIC Blood Culture adequate volume   Culture   Final    NO GROWTH 5 DAYS Performed at [facility], [address] [place], [place] [address]    Report Status 05/21/2019 FINAL  Final  Respiratory Panel by RT PCR (Flu A&B, Covid) - Nasopharyngeal Swab     Status: None   Collection Time: 06/08/2019  8:19 PM   Specimen: Nasopharyngeal Swab  Result Value Ref Range Status   SARS Coronavirus 2 by RT PCR NEGATIVE NEGATIVE Final    Comment: (NOTE) SARS-CoV-2 target nucleic acids are NOT DETECTED. The SARS-CoV-2 RNA is generally detectable in upper respiratoy specimens during the acute phase of infection. The lowest concentration of SARS-CoV-2 viral copies this assay can detect is 131 copies/mL. A negative result does not preclude SARS-Cov-2 infection and should not be used as the sole basis for treatment or other patient management decisions. A negative result may occur with  improper specimen collection/handling, submission of specimen other than nasopharyngeal swab, presence of viral mutation(s) within the areas targeted by this assay, and inadequate number of viral copies (<131 copies/mL). A negative result must be combined with clinical observations, patient history, and epidemiological information. The expected result is Negative. Fact Sheet for Patients:  PinkCheek.be Fact Sheet for Healthcare Providers:  GravelBags.it This test is not yet ap proved or cleared by the [place] FDA  and  has been authorized for detection and/or diagnosis of SARS-CoV-2 by FDA under an Emergency Use Authorization (EUA). This EUA will remain  in effect (meaning this test can be used) for the duration of the COVID-19 declaration under Section 564(b)(1) of the Act, 21 U.S.C. section 360bbb-3(b)(1), unless the authorization is terminated or revoked sooner.    Influenza A by PCR NEGATIVE NEGATIVE Final   Influenza B by PCR NEGATIVE NEGATIVE Final    Comment: (NOTE) The Xpert Xpress SARS-CoV-2/FLU/RSV assay is intended as an aid in    the diagnosis of influenza from Nasopharyngeal swab specimens and  should not be used as a sole basis for treatment. Nasal washings and  aspirates are unacceptable for Xpert Xpress SARS-CoV-2/FLU/RSV  testing. Fact Sheet for Patients: https://www.moore.com/ Fact Sheet for Healthcare Providers: https://www.young.biz/ This test is not yet approved or cleared by the [place] FDA and  has been authorized for detection and/or diagnosis of SARS-CoV-2 by  FDA under an Emergency Use Authorization (EUA). This EUA will remain  in effect (meaning this test can be used) for the duration of the  Covid-19 declaration under Section 564(b)(1) of the Act, 21  U.S.C. section 360bbb-3(b)(1), unless the authorization is  terminated or revoked. Performed at [facility], [address] [place], [place] [address]    Studies/Results: PERIPHERAL VASCULAR CATHETERIZATION  Result Date: 05/25/2019 See op note  Medications: I have reviewed the patient's current medications. Scheduled Meds: . atorvastatin  40 mg Oral q1800  . Chlorhexidine Gluconate Cloth  6 each Topical Q0600  . feeding supplement (ENSURE ENLIVE)  237 mL Oral TID BM  . FLUoxetine  20 mg Oral Daily  . ipratropium-albuterol  3 mL Nebulization TID  . lactulose  10 g Oral BID  . magnesium oxide  400 mg Oral BID  . Melatonin  2.5 mg Oral QHS  . midodrine  5 mg Oral TID WC  . mirtazapine  15 mg Oral QHS  . multivitamin with minerals  1 tablet Oral Daily  . octreotide  100 mcg Subcutaneous TID  . pantoprazole  40 mg Oral Daily  . patiromer  16.8 g Oral Daily   Continuous Infusions: . sodium chloride Stopped (05/22/19 2216)  . sodium chloride Stopped (2019/05/25 1761)  . sodium chloride    . sodium chloride    . albumin human 25 g (05-25-2019 2117)   PRN Meds:.sodium chloride, sodium chloride, sodium chloride, acetaminophen, albuterol, alteplase, heparin, lidocaine (PF),  lidocaine-prilocaine, LORazepam, metoprolol tartrate, ondansetron **OR** ondansetron (ZOFRAN) IV, oxyCODONE, pentafluoroprop-tetrafluoroeth   Assessment: Principal Problem:   Hypokalemia Active Problems:   Pulmonary hypertension (HCC)   COPD (chronic obstructive pulmonary disease) (HCC)   Bipolar 1 disorder (HCC)   AKI (acute kidney injury) (HCC)   Hypomagnesemia   Alcoholic hepatitis with ascites   Hyperglycemia   Acute liver failure without hepatic coma   Goals of care, counseling/discussion   Palliative care by specialist   DNR (do not resuscitate) discussion  [name] 59 y.o. male with acute alcoholic hepatitis started on steroids on 05/22/2019 . Ongoing active alcohol use till admission. Complicated with renal failure ?HRS. Renal following on Octreotide, midodrine and PPI. In acute liver failure with INR 3.8 was 1.7 5 days back. Tbilirubin 10 ,  Creatinine 3.69 .   Plan: 1. Reasses steroids after a total of 5 days. Overall very poor prognosis with acute liver failure, renal failure and hepatic encephelopathy .  2. Suggest palliative care consult.  3. Check acute hepatitis  panel to rule out co existing viral hepatitis.  4. Lactulose titrated 2 soft bowel movements per day for hepatic encephelopathy.Add xifaxan 5. Nephrology following for renal failure.    LOS: 6 days   Wyline Mood, MD 05/25/2019, 10:14 AM

## 2019-05-25 NOTE — Progress Notes (Signed)
Nutrition Follow Up Note   DOCUMENTATION CODES:   Not applicable  INTERVENTION:   Nepro Shake po TID, each supplement provides 425 kcal and 19 grams protein  Magic cup TID with meals, each supplement provides 290 kcal and 9 grams of protein  MVI, thiamine and folic acid in setting of etoh abuse   Rena-vite daily   NUTRITION DIAGNOSIS:   Increased nutrient needs related to chronic illness(COPD, CHF) as evidenced by increased estimated needs.  GOAL:   Patient will meet greater than or equal to 90% of their needs  -not met   MONITOR:   PO intake, Supplement acceptance, Labs, Weight trends, Skin, I & O's  ASSESSMENT:   59 y.o. male with medical history significant of valvular heart disease, pulmonary hypertension, status post bioprosthetic valve, congestive heart failure, history of mitral valve repair in 2000, hypertension, COPD, bipolar disorder, liver cirrhosis with hepatitis C, continued alcohol abuse, tobacco and cocaine abuse who is admitted with electrolyte abnormalties   Pt s/p placement of a 30 cm triple lumen dialysis catheter right femoral vein 2/15  Pt with poor appetite and oral intake in hospital; pt ate 25% of his breakfast this morning. Pt is drinking some Ensure supplements but is refusing most. Pt with worsening renal values; plan is to initiate HD. RD will change Ensure over to Nepro in setting of hyperkalemia and hyperphosphatemia. Per chart, pt up ~4lbs since admit. UOP 581m.   Medications reviewed and include: lactulose, Mg oxide, melatonin, remeron, MVI, octrotide, protonix, veltassa  Labs reviewed: Na 134(L), K 5.6(H), BUN 98(H), creat 3.60(H), P 7.4(H), tbili 11.6(H) Hgb 10.0(L), Hct 32.3(L), MCV 69.9(L), MCH 21.6(L) cbgs- 130, 148, 111, 68 x 24 hrs  Diet Order:   Diet Order            Diet renal/carb modified with fluid restriction Diet-HS Snack? Nothing; Fluid restriction: 1200 mL Fluid; Room service appropriate? Yes; Fluid consistency: Thin  Diet  effective now             EDUCATION NEEDS:   Education needs have been addressed  Skin:  Skin Assessment: Reviewed RN Assessment  Last BM:  2/12- type 6  Height:   Ht Readings from Last 1 Encounters:  05/29/2019 '5\' 8"'  (1.727 m)    Weight:   Wt Readings from Last 1 Encounters:  05/21/2019 68.5 kg    Ideal Body Weight:  70 kg  BMI:  Body mass index is 22.96 kg/m.  Estimated Nutritional Needs:   Kcal:  1900-2200kcal/day  Protein:  95-110g/day  Fluid:  >2L/day  CKoleen DistanceMS, RD, LDN Contact information available in Amion

## 2019-05-25 NOTE — Progress Notes (Signed)
Patient came back from failed dialysis attempt at 1315.  After checking vitals and fingerstick noted that patient was hypoglycemic.  d50 was given and glucose improved to 125.  Patient minimally responsive.  Able to open eyes but cannot focus on anything.     Dr. Maryfrances Bunnell, Dr. Constance Holster,  Dr. Tobi Bastos all have seen patient since he arrived back to the unit after failed dialysis attempt.  They all agree that he may be in multisystem organ failure.  Patient remains FULL CODE.  Palliative NP, [name] paged and came to see patient.  She will be contacting close family members to talk about goals/code status.    Patient remains a MEWS of 2.

## 2019-05-26 ENCOUNTER — Inpatient Hospital Stay: Payer: Medicaid Other

## 2019-05-26 DIAGNOSIS — K7290 Hepatic failure, unspecified without coma: Secondary | ICD-10-CM

## 2019-05-26 LAB — COMPREHENSIVE METABOLIC PANEL
ALT: 54 U/L — ABNORMAL HIGH (ref 0–44)
AST: 228 U/L — ABNORMAL HIGH (ref 15–41)
Albumin: 4.2 g/dL (ref 3.5–5.0)
Alkaline Phosphatase: 110 U/L (ref 38–126)
Anion gap: 31 — ABNORMAL HIGH (ref 5–15)
BUN: 106 mg/dL — ABNORMAL HIGH (ref 6–20)
CO2: 13 mmol/L — ABNORMAL LOW (ref 22–32)
Calcium: 8.9 mg/dL (ref 8.9–10.3)
Chloride: 89 mmol/L — ABNORMAL LOW (ref 98–111)
Creatinine, Ser: 4.61 mg/dL — ABNORMAL HIGH (ref 0.61–1.24)
GFR calc Af Amer: 15 mL/min — ABNORMAL LOW (ref >60)
GFR calc non Af Amer: 13 mL/min — ABNORMAL LOW (ref >60)
Glucose, Bld: 63 mg/dL — ABNORMAL LOW (ref 70–99)
Potassium: 6.2 mmol/L — ABNORMAL HIGH (ref 3.5–5.1)
Sodium: 133 mmol/L — ABNORMAL LOW (ref 135–145)
Total Bilirubin: 12.3 mg/dL — ABNORMAL HIGH (ref 0.3–1.2)
Total Protein: 7.5 g/dL (ref 6.5–8.1)

## 2019-05-26 LAB — BLOOD GAS, ARTERIAL
Acid-base deficit: 14.9 mmol/L — ABNORMAL HIGH (ref 0.0–2.0)
Bicarbonate: 10.7 mmol/L — ABNORMAL LOW (ref 20.0–28.0)
FIO2: 1
O2 Saturation: 99.7 %
Patient temperature: 37
pCO2 arterial: 25 mmHg — ABNORMAL LOW (ref 32.0–48.0)
pH, Arterial: 7.24 — ABNORMAL LOW (ref 7.350–7.450)
pO2, Arterial: 224 mmHg — ABNORMAL HIGH (ref 83.0–108.0)

## 2019-05-26 LAB — CBC
HCT: 29.5 % — ABNORMAL LOW (ref 39.0–52.0)
Hemoglobin: 9.4 g/dL — ABNORMAL LOW (ref 13.0–17.0)
MCH: 22.2 pg — ABNORMAL LOW (ref 26.0–34.0)
MCHC: 31.9 g/dL (ref 30.0–36.0)
MCV: 69.7 fL — ABNORMAL LOW (ref 80.0–100.0)
Platelets: 38 10*3/uL — ABNORMAL LOW (ref 150–400)
RBC: 4.23 MIL/uL (ref 4.22–5.81)
RDW: 25.4 % — ABNORMAL HIGH (ref 11.5–15.5)
WBC: 12.4 10*3/uL — ABNORMAL HIGH (ref 4.0–10.5)
nRBC: 17.4 % — ABNORMAL HIGH (ref 0.0–0.2)

## 2019-05-26 LAB — EBV AB TO VIRAL CAPSID AG PNL, IGG+IGM
EBV VCA IgG: 115 U/mL — ABNORMAL HIGH (ref 0.0–17.9)
EBV VCA IgM: <36 U/mL (ref 0.0–35.9)

## 2019-05-26 LAB — CMV DNA, QUANTITATIVE, PCR
CMV DNA Quant: NEGATIVE IU/mL
Log10 CMV Qn DNA Pl: UNDETERMINED log10 IU/mL

## 2019-05-26 LAB — HEPATITIS B DNA, ULTRAQUANTITATIVE, PCR
HBV DNA SERPL PCR-ACNC: NOT DETECTED IU/mL
HBV DNA SERPL PCR-LOG IU: UNDETERMINED log10 IU/mL

## 2019-05-26 LAB — DIFFERENTIAL
Abs Immature Granulocytes: 0.11 10*3/uL — ABNORMAL HIGH (ref 0.00–0.07)
Basophils Absolute: 0 10*3/uL (ref 0.0–0.1)
Basophils Relative: 0 %
Eosinophils Absolute: 0 10*3/uL (ref 0.0–0.5)
Eosinophils Relative: 0 %
Immature Granulocytes: 1 %
Lymphocytes Relative: 4 %
Lymphs Abs: 0.5 10*3/uL — ABNORMAL LOW (ref 0.7–4.0)
Monocytes Absolute: 0.6 10*3/uL (ref 0.1–1.0)
Monocytes Relative: 5 %
Neutro Abs: 11.3 10*3/uL — ABNORMAL HIGH (ref 1.7–7.7)
Neutrophils Relative %: 90 %

## 2019-05-26 LAB — HSV DNA BY PCR (REFERENCE LAB)
HSV 1 DNA: NEGATIVE
HSV 2 DNA: NEGATIVE

## 2019-05-26 LAB — HEPATITIS C VRS RNA DETECT BY PCR-QUAL: Hepatitis C Vrs RNA by PCR-Qual: NEGATIVE

## 2019-05-26 LAB — HEPATITIS B E ANTIBODY: Hep B E Ab: NEGATIVE

## 2019-05-26 LAB — PARATHYROID HORMONE, INTACT (NO CA): PTH: 283 pg/mL — ABNORMAL HIGH (ref 15–65)

## 2019-05-26 LAB — TROPONIN I (HIGH SENSITIVITY): Troponin I (High Sensitivity): 214 ng/L (ref <18)

## 2019-05-26 LAB — MAGNESIUM: Magnesium: 3.3 mg/dL — ABNORMAL HIGH (ref 1.7–2.4)

## 2019-05-26 MED ORDER — LORAZEPAM 1 MG PO TABS: 1.0000 mg | ORAL_TABLET | ORAL | Status: DC | PRN

## 2019-05-26 MED ORDER — LORAZEPAM 2 MG/ML PO CONC
1.0000 mg | ORAL | Status: DC | PRN
  Filled 2019-05-26: qty 1

## 2019-05-26 MED ORDER — SODIUM BICARBONATE 8.4 % IV SOLN: 100.0000 meq | Freq: Once | INTRAVENOUS | Status: DC

## 2019-05-26 MED ORDER — METOPROLOL TARTRATE 5 MG/5ML IV SOLN
5.0000 mg | Freq: Four times a day (QID) | INTRAVENOUS | Status: DC | PRN
  Administered 2019-05-26: 5 mg via INTRAVENOUS
  Filled 2019-05-26: qty 5

## 2019-05-26 MED ORDER — POLYVINYL ALCOHOL 1.4 % OP SOLN
1.0000 [drp] | OPHTHALMIC | Status: DC | PRN
  Filled 2019-05-26: qty 15

## 2019-05-26 MED ORDER — LORAZEPAM 2 MG/ML PO CONC
1.0000 mg | ORAL | Status: DC | PRN
  Filled 2019-05-26: qty 0.5

## 2019-05-26 MED ORDER — MORPHINE SULFATE (PF) 2 MG/ML IV SOLN
1.0000 mg | INTRAVENOUS | Status: DC | PRN
  Administered 2019-05-26 (×2): 1 mg via INTRAVENOUS
  Filled 2019-05-26 (×2): qty 1

## 2019-05-26 MED ORDER — LORAZEPAM 2 MG/ML IJ SOLN
INTRAMUSCULAR | Status: AC
  Filled 2019-05-26: qty 1

## 2019-05-26 MED ORDER — GLYCOPYRROLATE 0.2 MG/ML IJ SOLN
0.2000 mg | INTRAMUSCULAR | Status: DC | PRN
  Filled 2019-05-26: qty 1

## 2019-05-26 MED ORDER — GLYCOPYRROLATE 1 MG PO TABS
1.0000 mg | ORAL_TABLET | ORAL | Status: DC | PRN
  Filled 2019-05-26: qty 1

## 2019-05-26 MED ORDER — SODIUM BICARBONATE-DEXTROSE 150-5 MEQ/L-% IV SOLN
150.0000 meq | INTRAVENOUS | Status: DC
  Filled 2019-05-26: qty 1000

## 2019-05-26 MED ORDER — NOREPINEPHRINE 4 MG/250ML-% IV SOLN
0.0000 ug/min | INTRAVENOUS | Status: DC
  Filled 2019-05-26: qty 250

## 2019-05-26 MED ORDER — LORAZEPAM 2 MG/ML IJ SOLN: 1.0000 mg | INTRAMUSCULAR | Status: DC | PRN

## 2019-05-26 MED ORDER — ALBUTEROL SULFATE (2.5 MG/3ML) 0.083% IN NEBU: 2.5000 mg | INHALATION_SOLUTION | Freq: Four times a day (QID) | RESPIRATORY_TRACT | Status: DC | PRN

## 2019-05-26 MED ORDER — BIOTENE DRY MOUTH MT LIQD: 15.0000 mL | OROMUCOSAL | Status: DC | PRN

## 2019-05-26 MED ORDER — RIFAXIMIN 550 MG PO TABS
550.0000 mg | ORAL_TABLET | Freq: Two times a day (BID) | ORAL | Status: DC
  Filled 2019-05-26 (×2): qty 1

## 2019-05-26 MED ORDER — HYDROMORPHONE HCL 1 MG/ML IJ SOLN
0.5000 mg | INTRAMUSCULAR | Status: DC | PRN
  Administered 2019-05-27: 1 mg via INTRAVENOUS
  Filled 2019-05-26: qty 1

## 2019-05-26 MED ORDER — LORAZEPAM 2 MG/ML IJ SOLN
1.0000 mg | Freq: Once | INTRAMUSCULAR | Status: AC
  Administered 2019-05-26: 03:00:00 1 mg via INTRAVENOUS

## 2019-05-26 NOTE — Progress Notes (Signed)
Patient tachycardic, sustaining a heart rate of 150 and higher. On call NP ([name]) paged, and this RN requested PRM Metoprolol. Beta blocker administered at 0304 and minutes later patient began moaning and moving around in bed.   Primary RN and CNA at bedside attempting to assess patient needs when he began to look towards the ceiling and to the left. RN presumption that this was a pre seizure aura. Patient was then incontinent of bowel and bladder. Consulting civil engineer, on call NP, Respiratory, and lab notified and at bedside.   Patient code status DNR.   Ativan given, labs drawn.   Code status changed to Comfort Care at this time.

## 2019-05-26 NOTE — Progress Notes (Addendum)
    BRIEF OVERNIGHT PROGRESS REPORT   SUBJECTIVE: Patient had a brief episode of seizure like activity described as atonic with eye deviation to the left associated with loss of bowel and bladder function. No tongue biting noted.  Episode lasted approximately 60 seconds followed by post ictal phase.   OBJECTIVE: On arrival to the bedside, he was afebrile with blood pressure 63/42 mm Hg and pulse rate 81 beats/min. There were no focal neurological deficits; he was post ictal and non responsive to noxious stimuli.  use of accessory muscle of the abdomen for breathing. Patient was placed on non rebreathe due to hypoxia   ASSESSMENT: 59 year old man with acute on chronic HFrEF in the setting of valvular heart disease , hepatic encephalopathy, alcoholic cirrhosis admitted with new onset Afib with RVR, ARF, Hyperkalemia, anemia, severe thrombocytopenia.  PLAN: Given multiple medical issues with poor prognosis and now with what appeared to be seizures, I had a long conversation with patient's family to update on current condition. I explained to them that it is unlikely that proceeding with work up for current seizure including CT head/EEG or Neurology evaluation would be beneficial or change current management. Patient is also in metabolic acidosis and may decompensate further. Per family members, they would wish to transition patient to comfort care/hospice. I answered all the questions they had and assured them that we will make patient very comfortable.    Webb Silversmith, DNP, CCRN, FNP-C [facility] Nurse Practitioner Between 7pm to 7am - Pager [phone]  After 7am go to [email or web address] - [email or web address] select [facility]  [employer]  [phone]

## 2019-05-26 NOTE — Progress Notes (Signed)
Clarified with dr. Cherylann Ratel and dr Lucianne Muss that since patient is now on comfort care does his trialysis catheter need to stay or does it need to be removed. Per Dr. Cherylann Ratel trialysis catheter can be removed.

## 2019-05-26 NOTE — Progress Notes (Signed)
PROGRESS NOTE    [name]  JAS:[id number] DOB: Sep 05, 1960 DOA: 05/20/19 PCP: Medicine, [employer]      Brief Narrative:  [name] is a 59 y.o. 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  Went to see his Cardiologist prior to admission, was tachypneic, tachycardic and had hypokalemia and fluid overload, sent to ER for diuresis.  In the ER, hypertensive, pulse 114, WBC 6K, platelets 91K, creatinine 1.8, procalcitonin 0.39, lactate 3, Covid negative, chest x-ray with pneumonia versus edema with small effusions.  Started on empiric antibiotics and admitted.   Assessment & Plan:   Decompensated alcoholic hep C cirrhosis Ongoing alcohol use, variable reports, per GI, 80-120 oz beer daily.  MELD >30.  Discriminant >40 at admission.  Moderate ascites on exam and by CT imaging and so admitted and started on cautious diuretics and renal function started to worsen.    Diuretics held, nephrology and GI consulted, started on albumin, midodrine and octreotide for HRS  Cr has stabilized but INR trending up, Tbili still >9, K elevated, severe fluid overload.   -s/p prednisone and lactulose.   -Consult Palliative Care -- patient expressed wishes for DNR status to Palliative Care and nursing, CODE STATUS changed.  Patient was finally made for comfort measures only  -Consult GI re: steroids, liver failure, risk of anticoagulation and risk of TEE -Consult Nephrology re: possible HRS, utility of splanchnic vasoconstrictors   Acute renal failure Hyperkalemia Cr baseline 1.2.  Cr stable around 3.5 today.  Suspect HRS. Femoral line placed yesteerday.  Today patient went for HD at noon, after 5 minutes trial, he became tachycardic to 170s in Afib and HR dropped to 80s.    HD stopped.  Nephrology agreed with  removal of dialysis catheter and comfort measures  -s/p albumin, midodrine, octreotide -s/p veltassa -s/p cautious IV fluids, which have helped renal function (not taking much food by mouth)  Atrial flutter, typical New onset this hospitalization Not anticoagulation candidate at present.  HR improved to 80s yesterday, severely tachycardic with dialysis. -Continue PRN metoprolol for HR >110   Possible pneumonia Procalcitonin 0.38, pneumonia possible by chest x-ray. Completed 5 days azithromycin and ceftriaxone  Acute on chronic systolic and diastolic CHF History of aortic valve repair, twice (1990, 2000) History of mitral valve repair (1990, now with moderate mitral regurgitation and stenosis) Admitted with BNP >1500, effusions on exam.  Per PCP notes, dry weight 145 pounds, but clearly fluid overloaded on exam due to CHF and liver failure. S/p Diuretics, subsequently held due to worsening renal function. EF 40 to 45% last October. -Hold diuretics, Strict I/Os, daily weights, telemetry  -Daily monitoring renal function -Lisinopril, spironolactone contraindicated -Hold atorvastatin, aspirin  COPD No wheezing to suggest active bronchospasm -Continue home LABA/LAMA   Alcohol use No real symptoms of withdrawal at present.  Oky to stop CIWA scoring. -Continue thiamine and folate  Thrombocytopenia Trending down.  Fem cath oozing.     GERD -Continue pantoprazole   Anemia of chronic disease Hemoglobin stable   Depression -Continue fluoxetine, mirtazapine  Moderate protein calorie malnutrition As evidenced by severe chronic disease, poor oral intake here in the hospital, and diffuse loss of subcutaneous muscle mass and fat -Consult nutrition   Disposition: The patient was admitted with malaise, found to have combined heart, liver, and renal failure.  His liver and renal failure worsens.  Trial of dialysis failed today.  Nephrology are uncertain if he will be able to  tolerate tomorrow.  All temporizing measures to continue, but ongoing goals of care discussions are being had.  Made DNR and comfort measures only due to very dire prognosis.   Awaiting for transfer to hospice facility    MDM: The below labs and imaging reports reviewed and summarized above.  Medication management as above.    DVT prophylaxis: SCDs Code Status: Full code Family Communication:      Consultants:   Cardiology  GI  Paliative Care  Nephrology   Procedures:   2/10 chest x-ray-right lower lobe opacity, possible pneumonia  2/11 CT chest-effusion, no pneumonia or mass  2/12 CT abdomen -- ascites  Antimicrobials:   Ceftriaxone and azithromycin 2/11>>  Culture data:   2/10 blood culture x2-no growth    Subjective: No overnight issues, patient was seen and examined at bedside.  Patient remained somnolent and very lethargic unable to offer any complaints.    Objective: Vitals:   05/26/19 0739 05/26/19 0806 05/26/19 1155 05/26/19 1608  BP:  (!) 90/47 (!) 99/59 (!) 75/18  Pulse:  (!) 108 68 (!) 128  Resp:  16 18 18   Temp:  (!) 97.4 F (36.3 C) 97.9 F (36.6 C) 98.2 F (36.8 C)  TempSrc:  Oral    SpO2: 94% 94% 94% 97%  Weight:      Height:        Intake/Output Summary (Last 24 hours) at 05/26/2019 1752 Last data filed at 05/25/2019 1811 Gross per 24 hour  Intake -  Output 100 ml  Net -100 ml   Filed Weights   05/23/19 0410 06/02/2019 1740 05/23/2019 1918  Weight: 67.3 kg 68.6 kg 68.5 kg    Examination: General appearance: thin adult male, less alert and in moderate distress.   HEENT: Anicteric, conjunctiva pink, lids and lashes normal. No nasal deformity, discharge, epistaxis.    Skin: Warm and dry.  No suspicious rashes or lesions. Cardiac: Tachycardic, regular, no murmurs appreciated.  1+ LE edema.    Respiratory: Tachpyneic.  CTAB without rales or wheezes. Abdomen: Abdomen with ascites, distension, hepatosplenomegaly.   MSK: No  deformities or effusions of the large joints of the upper or lower extremities bilaterally. Neuro: Less alert.  Sometimes responds to questions. Moves extremities spontaneously, unable to follow commands Psych: Unable to assess due to decreased responsiveness             Data Reviewed: I have personally reviewed following labs and imaging studies:  CBC: Recent Labs  Lab Jun 01, 2019 2018 2019/06/01 2329 05/22/19 0518 05/23/19 0530 05/23/2019 0544 05/25/19 0724 05/26/19 0335  WBC 6.7   < > 9.2 6.2 6.9 9.6 12.4*  NEUTROABS 5.3  --   --   --   --   --  11.3*  HGB 11.8*   < > 10.9* 11.0* 10.9* 10.0* 9.4*  HCT 37.9*   < > 34.5* 35.2* 34.0* 32.3* 29.5*  MCV 68.5*   < > 68.5* 68.0* 68.4* 69.9* 69.7*  PLT 91*   < > 91* 83* 54* 43* 38*   < > = values in this interval not displayed.   Basic Metabolic Panel: Recent Labs  Lab 06-01-19 2018 06-01-2019 2018 Jun 01, 2019 2329 05/20/19 07/18/19 05/21/19 0631 05/21/19 0631 05/22/19 0518 05/23/19 0530 06/02/2019 0544 05/20/2019 1630 05/25/19 0724 05/26/19 0335  NA 137   < > 136   < > 137   < >  132* 132* 131*  --  134* 133*  K 2.7*   < > 2.8*   < > 4.8   < > 4.6 5.4* 5.7*  --  5.6* 6.2*  CL 90*   < > 94*   < > 95*   < > 94* 92* 92*  --  91* 89*  CO2 33*   < > 28   < > 23   < > 26 20* 20*  --  16* 13*  GLUCOSE 223*   < > 178*   < > 46*   < > 140* 154* 129*  --  70 63*  BUN 39*   < > 39*   < > 54*   < > 73* 92* 101*  --  98* 106*  CREATININE 1.79*   < > 1.74*   < > 2.43*   < > 3.32* 3.71* 3.69*  --  3.60* 4.61*  CALCIUM 8.4*   < > 8.0*   < > 9.0   < > 8.6* 8.9 8.8*  --  9.0 8.9  MG 1.6*  --  1.6*  --  2.2  --   --   --   --   --   --  3.3*  PHOS  --   --  2.4*  --   --   --   --   --   --  7.4*  --   --    < > = values in this interval not displayed.   GFR: Estimated Creatinine Clearance: 16.7 mL/min (A) (by C-G formula based on SCr of 4.61 mg/dL (H)). Liver Function Tests: Recent Labs  Lab 05/22/19 0518 05/23/19 0530 05/23/2019 0544 05/25/19  0724 05/26/19 0335  AST 55* 65* 70* 113* 228*  ALT 28 27 30  37 54*  ALKPHOS 158* 144* 125 112 110  BILITOT 9.5* 10.1* 10.0* 11.6* 12.3*  PROT 7.1 7.3 7.6 7.6 7.5  ALBUMIN 2.5* 2.8* 3.5 4.1 4.2   No results for input(s): LIPASE, AMYLASE in the last 168 hours. Recent Labs  Lab 06/07/2019 2019  AMMONIA 25   Coagulation Profile: Recent Labs  Lab 06/07/2019 2018 05/21/19 0921 05/23/19 0530 05/14/2019 0544 05/25/19 0724  INR 1.7* 2.5* 3.4* 3.8* 5.0*   Cardiac Enzymes: No results for input(s): CKTOTAL, CKMB, CKMBINDEX, TROPONINI in the last 168 hours. BNP (last 3 results) No results for input(s): PROBNP in the last 8760 hours. HbA1C: No results for input(s): HGBA1C in the last 72 hours. CBG: Recent Labs  Lab 05/25/19 1351 05/25/19 1631 05/25/19 2126 05/25/19 2248 05/25/19 2323  GLUCAP 125* 104* 69* 68* 78   Lipid Profile: No results for input(s): CHOL, HDL, LDLCALC, TRIG, CHOLHDL, LDLDIRECT in the last 72 hours. Thyroid Function Tests: No results for input(s): TSH, T4TOTAL, FREET4, T3FREE, THYROIDAB in the last 72 hours. Anemia Panel: No results for input(s): VITAMINB12, FOLATE, FERRITIN, TIBC, IRON, RETICCTPCT in the last 72 hours. Urine analysis:    Component Value Date/Time   COLORURINE AMBER (A) 2019/06/07 2222   APPEARANCEUR CLEAR (A) June 07, 2019 2222   APPEARANCEUR Clear 12/25/2013 0243   LABSPEC 1.009 June 07, 2019 2222   LABSPEC 1.002 12/25/2013 0243   PHURINE 6.0 2019-06-07 2222   GLUCOSEU NEGATIVE 2019/06/07 2222   GLUCOSEU Negative 12/25/2013 0243   HGBUR NEGATIVE Jun 07, 2019 2222   BILIRUBINUR NEGATIVE 2019/06/07 2222   BILIRUBINUR Negative 12/25/2013 0243   KETONESUR NEGATIVE 2019-06-07 2222   PROTEINUR NEGATIVE 06/07/19 2222   NITRITE NEGATIVE June 07, 2019 2222   LEUKOCYTESUR NEGATIVE 07-Jun-2019  [place] Negative 12/25/2013 0243   Sepsis Labs: @LABRCNTIP (procalcitonin:4,lacticacidven:4)  ) Recent Results (from the past 240 hour(s))   Blood culture (routine x 2)     Status: None   Collection Time: 05/26/2019  8:18 PM   Specimen: BLOOD  Result Value Ref Range Status   Specimen Description BLOOD RIGHT FOREARM  Final   Special Requests   Final    BOTTLES DRAWN AEROBIC AND ANAEROBIC Blood Culture adequate volume   Culture   Final    NO GROWTH 5 DAYS Performed at [facility], [address] [place], [place] [address]    Report Status 05/30/2019 FINAL  Final  Blood culture (routine x 2)     Status: None   Collection Time: 05/29/2019  8:18 PM   Specimen: BLOOD  Result Value Ref Range Status   Specimen Description BLOOD RIGHT ANTECUBITAL  Final   Special Requests   Final    BOTTLES DRAWN AEROBIC AND ANAEROBIC Blood Culture adequate volume   Culture   Final    NO GROWTH 5 DAYS Performed at [facility], [address] [place], [place] [address]    Report Status 05/11/2019 FINAL  Final  Respiratory Panel by RT PCR (Flu A&B, Covid) - Nasopharyngeal Swab     Status: None   Collection Time: 06/02/2019  8:19 PM   Specimen: Nasopharyngeal Swab  Result Value Ref Range Status   SARS Coronavirus 2 by RT PCR NEGATIVE NEGATIVE Final    Comment: (NOTE) SARS-CoV-2 target nucleic acids are NOT DETECTED. The SARS-CoV-2 RNA is generally detectable in upper respiratoy specimens during the acute phase of infection. The lowest concentration of SARS-CoV-2 viral copies this assay can detect is 131 copies/mL. A negative result does not preclude SARS-Cov-2 infection and should not be used as the sole basis for treatment or other patient management decisions. A negative result may occur with  improper specimen collection/handling, submission of specimen other than nasopharyngeal swab, presence of viral mutation(s) within the areas targeted by this assay, and inadequate number of viral copies (<131 copies/mL). A negative result must be combined with clinical observations, patient history, and epidemiological information.  The expected result is Negative. Fact Sheet for Patients:  PinkCheek.be Fact Sheet for Healthcare Providers:  GravelBags.it This test is not yet ap proved or cleared by the [place] FDA and  has been authorized for detection and/or diagnosis of SARS-CoV-2 by FDA under an Emergency Use Authorization (EUA). This EUA will remain  in effect (meaning this test can be used) for the duration of the COVID-19 declaration under Section 564(b)(1) of the Act, 21 U.S.C. section 360bbb-3(b)(1), unless the authorization is terminated or revoked sooner.    Influenza A by PCR NEGATIVE NEGATIVE Final   Influenza B by PCR NEGATIVE NEGATIVE Final    Comment: (NOTE) The Xpert Xpress SARS-CoV-2/FLU/RSV assay is intended as an aid in  the diagnosis of influenza from Nasopharyngeal swab specimens and  should not be used as a sole basis for treatment. Nasal washings and  aspirates are unacceptable for Xpert Xpress SARS-CoV-2/FLU/RSV  testing. Fact Sheet for Patients: PinkCheek.be Fact Sheet for Healthcare Providers: GravelBags.it This test is not yet approved or cleared by the [place] FDA and  has been authorized for detection and/or diagnosis of SARS-CoV-2 by  FDA under an Emergency Use Authorization (EUA). This EUA will remain  in effect (meaning this test can be used) for the duration of the  Covid-19 declaration under Section 564(b)(1) of the Act,  21  U.S.C. section 360bbb-3(b)(1), unless the authorization is  terminated or revoked. Performed at [name], [address], [place], [place] [address]          Radiology Studies: No results found.      Scheduled Meds:  Continuous Infusions:    LOS: 7 days    Time spent: 35 minutes    Gillis Santa, MD [facility] 05/26/2019, 5:52 PM     Please page though AMION or Epic secure chat:   For [employer], Higher education careers adviser

## 2019-05-26 NOTE — Progress Notes (Signed)
Dressing to right groin clean dry and intact. No bleeding noted will continue to monitor

## 2019-05-26 NOTE — Progress Notes (Signed)
Noted plans for comfort care, I will sign off.  Please call me if any further GI concerns or questions.  We would like to thank you for the opportunity to participate in the care of [name].    Dr Wyline Mood MD,MRCP [facility]) Gastroenterology/Hepatology Pager: [phone]

## 2019-05-26 NOTE — Progress Notes (Signed)
Patient placed in supine position, sterile technique used. Dressing to right trialysis catheter removed, along with sutures. catheter removed tip intact. vasoline and gauze applied, pressure was held for 15 minutes. No bleeding noted after pressure was held. Tegaderm placed over vasoline gauze and sterile 2x2 gauze. Will continue to monitor

## 2019-05-26 NOTE — Progress Notes (Signed)
[facility]  ROUNDING NOTE   Subjective:  Patient seen and evaluated at bedside. Not doing very well. Did not tolerate dialysis very well at all yesterday. It appears family favoring comfort care at this time.    Objective:  Vital signs in last 24 hours:  Temp:  [97.4 F (36.3 C)-98.4 F (36.9 C)] 97.9 F (36.6 C) (02/17 1155) Pulse Rate:  [50-153] 68 (02/17 1155) Resp:  [16-20] 18 (02/17 1155) BP: (63-134)/(21-82) 99/59 (02/17 1155) SpO2:  [89 %-100 %] 94 % (02/17 1155)  Weight change:  Filed Weights   05/23/19 0410 05/29/2019 1740 06/05/2019 1918  Weight: 67.3 kg 68.6 kg 68.5 kg    Intake/Output: I/O last 3 completed shifts: In: 240 [P.O.:240] Out: 600 [Urine:600]   Intake/Output this shift:  No intake/output data recorded.  Physical Exam: General: No acute distress  Head: Normocephalic, atraumatic. Moist oral mucosal membranes  Eyes: Anicteric  Neck: Supple, trachea midline  Lungs:  Clear to auscultation, normal effort  Heart: S1S2 no rubs  Abdomen:  Soft, distension noted  Extremities: trace peripheral edema.  Neurologic: Lethargic, arousable  Skin: No lesions       Basic Metabolic Panel: Recent Labs  Lab 05/22/2019 2018 2019-05-22 2018 05-22-2019 2329 05/20/19 6294 05/21/19 0631 05/21/19 0631 05/22/19 0518 05/22/19 0518 05/23/19 0530 05/23/19 0530 05/10/2019 0544 05/12/2019 1630 05/25/19 0724 05/26/19 0335  NA 137   < > 136   < > 137   < > 132*  --  132*  --  131*  --  134* 133*  K 2.7*   < > 2.8*   < > 4.8   < > 4.6  --  5.4*  --  5.7*  --  5.6* 6.2*  CL 90*   < > 94*   < > 95*   < > 94*  --  92*  --  92*  --  91* 89*  CO2 33*   < > 28   < > 23   < > 26  --  20*  --  20*  --  16* 13*  GLUCOSE 223*   < > 178*   < > 46*   < > 140*  --  154*  --  129*  --  70 63*  BUN 39*   < > 39*   < > 54*   < > 73*  --  92*  --  101*  --  98* 106*  CREATININE 1.79*   < > 1.74*   < > 2.43*   < > 3.32*  --  3.71*  --  3.69*  --  3.60* 4.61*  CALCIUM 8.4*    < > 8.0*   < > 9.0   < > 8.6*   < > 8.9   < > 8.8*  --  9.0 8.9  MG 1.6*  --  1.6*  --  2.2  --   --   --   --   --   --   --   --  3.3*  PHOS  --   --  2.4*  --   --   --   --   --   --   --   --  7.4*  --   --    < > = values in this interval not displayed.    Liver Function Tests: Recent Labs  Lab 05/22/19 0518 05/23/19 0530 05/16/2019 0544 05/25/19 0724 05/26/19 0335  AST 55* 65* 70* 113* 228*  ALT 28 27 30  37 54*  ALKPHOS 158* 144* 125 112 110  BILITOT 9.5* 10.1* 10.0* 11.6* 12.3*  PROT 7.1 7.3 7.6 7.6 7.5  ALBUMIN 2.5* 2.8* 3.5 4.1 4.2   No results for input(s): LIPASE, AMYLASE in the last 168 hours. Recent Labs  Lab 06/04/2019 2019  AMMONIA 25    CBC: Recent Labs  Lab 06/05/2019 1716 05/18/2019 1716 05/22/2019 2018 05/21/2019 2329 05/22/19 0518 05/23/19 0530 05/29/2019 0544 05/25/19 0724 05/26/19 0335  WBC 7.8   < > 6.7   < > 9.2 6.2 6.9 9.6 12.4*  NEUTROABS 5.8  --  5.3  --   --   --   --   --  11.3*  HGB 11.9*   < > 11.8*   < > 10.9* 11.0* 10.9* 10.0* 9.4*  HCT 37.3*   < > 37.9*   < > 34.5* 35.2* 34.0* 32.3* 29.5*  MCV 66.8*   < > 68.5*   < > 68.5* 68.0* 68.4* 69.9* 69.7*  PLT 93*   < > 91*   < > 91* 83* 54* 43* 38*   < > = values in this interval not displayed.    Cardiac Enzymes: No results for input(s): CKTOTAL, CKMB, CKMBINDEX, TROPONINI in the last 168 hours.  BNP: Invalid input(s): POCBNP  CBG: Recent Labs  Lab 05/25/19 1351 05/25/19 1631 05/25/19 2126 05/25/19 2248 05/25/19 2323  GLUCAP 125* 104* 69* 68* 78    Microbiology: Results for orders placed or performed during the hospital encounter of 05/29/2019  Blood culture (routine x 2)     Status: None   Collection Time: 06/02/2019  8:18 PM   Specimen: BLOOD  Result Value Ref Range Status   Specimen Description BLOOD RIGHT FOREARM  Final   Special Requests   Final    BOTTLES DRAWN AEROBIC AND ANAEROBIC Blood Culture adequate volume   Culture   Final    NO GROWTH 5 DAYS Performed at [facility], [address], [place], [place]    Report Status 05/11/2019 FINAL  Final  Blood culture (routine x 2)     Status: None   Collection Time: 06/03/2019  8:18 PM   Specimen: BLOOD  Result Value Ref Range Status   Specimen Description BLOOD RIGHT ANTECUBITAL  Final   Special Requests   Final    BOTTLES DRAWN AEROBIC AND ANAEROBIC Blood Culture adequate volume   Culture   Final    NO GROWTH 5 DAYS Performed at [facility], [address] [place], [place]    Report Status 05/25/2019 FINAL  Final  Respiratory Panel by RT PCR (Flu A&B, Covid) - Nasopharyngeal Swab     Status: None   Collection Time: 05/29/2019  8:19 PM   Specimen: Nasopharyngeal Swab  Result Value Ref Range Status   SARS Coronavirus 2 by RT PCR NEGATIVE NEGATIVE Final    Comment: (NOTE) SARS-CoV-2 target nucleic acids are NOT DETECTED. The SARS-CoV-2 RNA is generally detectable in upper respiratoy specimens during the acute phase of infection. The lowest concentration of SARS-CoV-2 viral copies this assay can detect is 131 copies/mL. A negative result does not preclude SARS-Cov-2 infection and should not be used as the sole basis for treatment or other patient management decisions. A negative result may occur with  improper specimen collection/handling, submission of specimen other than nasopharyngeal swab, presence of viral mutation(s) within the areas targeted by this assay, and inadequate number of viral copies (<131 copies/mL). A negative  result must be combined with clinical observations, patient history, and epidemiological information. The expected result is Negative. Fact Sheet for Patients:  PinkCheek.be Fact Sheet for Healthcare Providers:  GravelBags.it This test is not yet ap proved or cleared by the [place] FDA and  has been authorized for detection and/or diagnosis of SARS-CoV-2 by FDA under an  Emergency Use Authorization (EUA). This EUA will remain  in effect (meaning this test can be used) for the duration of the COVID-19 declaration under Section 564(b)(1) of the Act, 21 U.S.C. section 360bbb-3(b)(1), unless the authorization is terminated or revoked sooner.    Influenza A by PCR NEGATIVE NEGATIVE Final   Influenza B by PCR NEGATIVE NEGATIVE Final    Comment: (NOTE) The Xpert Xpress SARS-CoV-2/FLU/RSV assay is intended as an aid in  the diagnosis of influenza from Nasopharyngeal swab specimens and  should not be used as a sole basis for treatment. Nasal washings and  aspirates are unacceptable for Xpert Xpress SARS-CoV-2/FLU/RSV  testing. Fact Sheet for Patients: PinkCheek.be Fact Sheet for Healthcare Providers: GravelBags.it This test is not yet approved or cleared by the [place] FDA and  has been authorized for detection and/or diagnosis of SARS-CoV-2 by  FDA under an Emergency Use Authorization (EUA). This EUA will remain  in effect (meaning this test can be used) for the duration of the  Covid-19 declaration under Section 564(b)(1) of the Act, 21  U.S.C. section 360bbb-3(b)(1), unless the authorization is  terminated or revoked. Performed at [facility], [address] [place], [place] [address]     Coagulation Studies: Recent Labs    05/26/2019 0544 05/25/19 0724  LABPROT 37.5* 46.4*  INR 3.8* 5.0*    Urinalysis: No results for input(s): COLORURINE, LABSPEC, PHURINE, GLUCOSEU, HGBUR, BILIRUBINUR, KETONESUR, PROTEINUR, UROBILINOGEN, NITRITE, LEUKOCYTESUR in the last 72 hours.  Invalid input(s): APPERANCEUR    Imaging: No results found.   Medications:     albuterol, antiseptic oral rinse, glycopyrrolate **OR** glycopyrrolate **OR** glycopyrrolate, HYDROmorphone (DILAUDID) injection, LORazepam **OR** LORazepam **OR** LORazepam, ondansetron **OR** ondansetron (ZOFRAN) IV,  polyvinyl alcohol  Assessment/ Plan:  59 y.o. male with a PMHx of alcohol abuse, bipolar disorder, congestive heart failure, COPD, hypertension, valvular heart disease, who was admitted to [facility] on 2019/05/20 for evaluation of shortness of breath as well as acute kidney injury.  1.  Acute kidney injury, differential diagnosis between acute tubular necrosis versus hepatorenal syndrome. 2.  Alcoholic hepatitis. 3.  Hyponatremia. 4.  Anemia unspecified. 5.  Hyperkalemia.  Plan: Patient with worsening status overall.  He did not tolerate dialysis treatment very well at all.  Therefore it was decided to pursue comfort care given severe metabolic derangements.  Potassium now up to 6.2 with a BUN of 106 and creatinine of 4.6.  He was difficult to arouse today.  Agree with plans for comfort care at this time as he has not responded very well at all to more aggressive treatment.   LOS: 7 May Manrique 2/17/20213:17 PM

## 2019-05-26 NOTE — Progress Notes (Signed)
Palliative: [name] is resting in bed. He appears comfortable. He worsened overnight and is now comfort care.  Call to [name], [phone] is not accepting calls.  Call to [phone], left VM requesting confirmation of residential hospice facility choice.   Conference with attending and bedside nursing staff related to comfort care, patient condition and needs.  Orders adjusted  Plan: Full comfort care, trying to reach family for residential hospice placement. Prognosis: Possibility of days, but sudden decline and death within hours would not be surprising.  35 minutes Mike Carmel, NP Palliative Medicine Team Team Phone # [phone] Greater than 50% of this time was spent counseling and coordinating care related to the above assessment and plan.

## 2019-05-27 LAB — VARICELLA-ZOSTER BY PCR: Varicella-Zoster, PCR: NEGATIVE

## 2019-06-02 ENCOUNTER — Ambulatory Visit: Payer: Medicaid Other | Admitting: Family

## 2019-06-07 ENCOUNTER — Ambulatory Visit: Payer: Medicaid Other | Admitting: Internal Medicine

## 2019-06-07 NOTE — Death Summary Note (Signed)
Death Summary  [name]:[id number] DOB: 11-09-1960 DOA: 2019-06-17  PCP: Medicine, [place] date: June 17, 2019 Date of Death: Jun 25, 2019  Final Diagnoses:  Principal Problem:   Hypokalemia Active Problems:   Pulmonary hypertension (HCC)   COPD (chronic obstructive pulmonary disease) (HCC)   Bipolar 1 disorder (HCC)   AKI (acute kidney injury) (HCC)   Hypomagnesemia   Alcoholic hepatitis with ascites   Hyperglycemia   Acute liver failure without hepatic coma   Goals of care, counseling/discussion   Palliative care by specialist   DNR (do not resuscitate) discussion    Primary cause of death acute liver failure   History of present illness:  Mr. Fritchman is a 59 y.o. 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 Went to see his Cardiologist prior to admission, was tachypneic, tachycardic and had hypokalemia and fluid overload, sent to ER for diuresis. In the ER, hypertensive, pulse 114, WBC 6K, platelets 91K, creatinine 1.8, procalcitonin 0.39, lactate 3, Covid negative, chest x-ray with pneumonia versus edema with small effusions.  Started on empiric antibiotics and admitted.  Hospital Course:  Decompensated alcoholic hep C cirrhosis Ongoing alcohol use, variable reports, per GI, 80-120 oz beer daily.  MELD >30.  Discriminant >40 at admission. Moderate ascites on exam and by CT imaging and so admitted and started on cautious diuretics and renal function started to worsen.   Diuretics held, nephrology and GI consulted, started on albumin, midodrine and octreotide for HRS Cr has stabilized but INR trending up, Tbili still >9, K elevated, severe fluid overload. s/p prednisone and lactulose. Consult GI re: steroids for liver failure. Consulted Nephrology re: possible HRS, utility of splanchnic  vasoconstrictors. Consult Palliative Care -- patient expressed wishes for DNR status to Palliative Care and nursing, CODE STATUS changed.  Patient was finally made for comfort measures only.  Acute renal failure and Hyperkalemia Cr baseline 1.2. Cr  around 3.5, Suspect HRS. Femoral line placed, patient went for HD at noon, after 5 minutes trial, he became tachycardic to 170s in Afib and HR dropped to 80s.  HD stopped. s/p albumin, midodrine, octreotide, s/p veltassa. s/p cautious IV fluids, which have helped renal function (not taking much food by mouth)  Nephrology agreed with removal of dialysis catheter and comfort measures. Atrial flutter, typical, New onset this hospitalization, Not anticoagulation candidate. HR improved to 80s, severely tachycardic with dialysis. S/p PRN metoprolol for HR >110 Possible pneumonia, Procalcitonin 0.38, pneumonia possible by chest x-ray. Completed 5 days azithromycin and ceftriaxone Acute on chronic systolic and diastolic CHF, History of aortic valve repair, twice (1990, 2000) History of mitral valve repair (1990, now with moderate mitral regurgitation and stenosis) Admitted with BNP >1500, effusions on exam.  Per PCP notes, dry weight 145 pounds, but clearly fluid overloaded on exam due to CHF and liver failure. S/p Diuretics, subsequently held due to worsening renal function. EF 40 to 45% last October. Hold diuretics, Strict I/Os, daily weights, and monitor on telemetry. Lisinopril, spironolactone contraindicated, Hold atorvastatin, aspirin as well.  COPD, No wheezing to suggest active bronchospasm, s/p home LABA/LAMA  Alcohol use, No real symptoms of withdrawal at present.  pt was on CIWA scoring, thiamine and folate Thrombocytopenia, Trending down.  Fem cath oozing was noticed as well.  GERD, s/p pantoprazole Anemia of chronic disease, Hemoglobin stable  Depression,  s/p fluoxetine, mirtazapine Moderate protein calorie malnutrition. As evidenced by severe  chronic disease, poor oral intake here in the hospital, and diffuse loss of subcutaneous muscle mass and fat. Consulted nutrition  Overall patient's prognosis was poor, palliative care consulted and patient's CODE STATUS was changed to DNR and comfort measures only.  Patient was resting comfortably and passed away today.  Time: 10:50 am  Signed:  Gillis Santa  [facility] Jun 15, 2019, 12:09 PM

## 2019-06-07 NOTE — Progress Notes (Signed)
Patient's contact (sister) Quentin Ore [phone]) notified patient passed.

## 2019-06-07 NOTE — Progress Notes (Signed)
Palliative: [name] is full comfort care.  He is actively dying, but resting comfortably.  There is no family at bedside at this time.  Conference with bedside nursing staff related to patient condition, needs.  Plan:   Full comfort care, anticipate hospital death in hours  15 minutes [name], NP Palliative Medicine Team Team Phone # [phone] Greater than 50% of this time was spent counseling and coordinating care related to the above assessment and plan.

## 2019-06-07 DEATH — deceased

## 2020-05-08 IMAGING — CR DG CHEST 2V
2 series · 2 of 2 positions shown · non-contrast
Comparison: 01/08/2019 chest radiograph.

CLINICAL DATA: Dyspnea for few weeks with left lower extremity
swelling

EXAM:
CHEST - 2 VIEW

[chest pa]
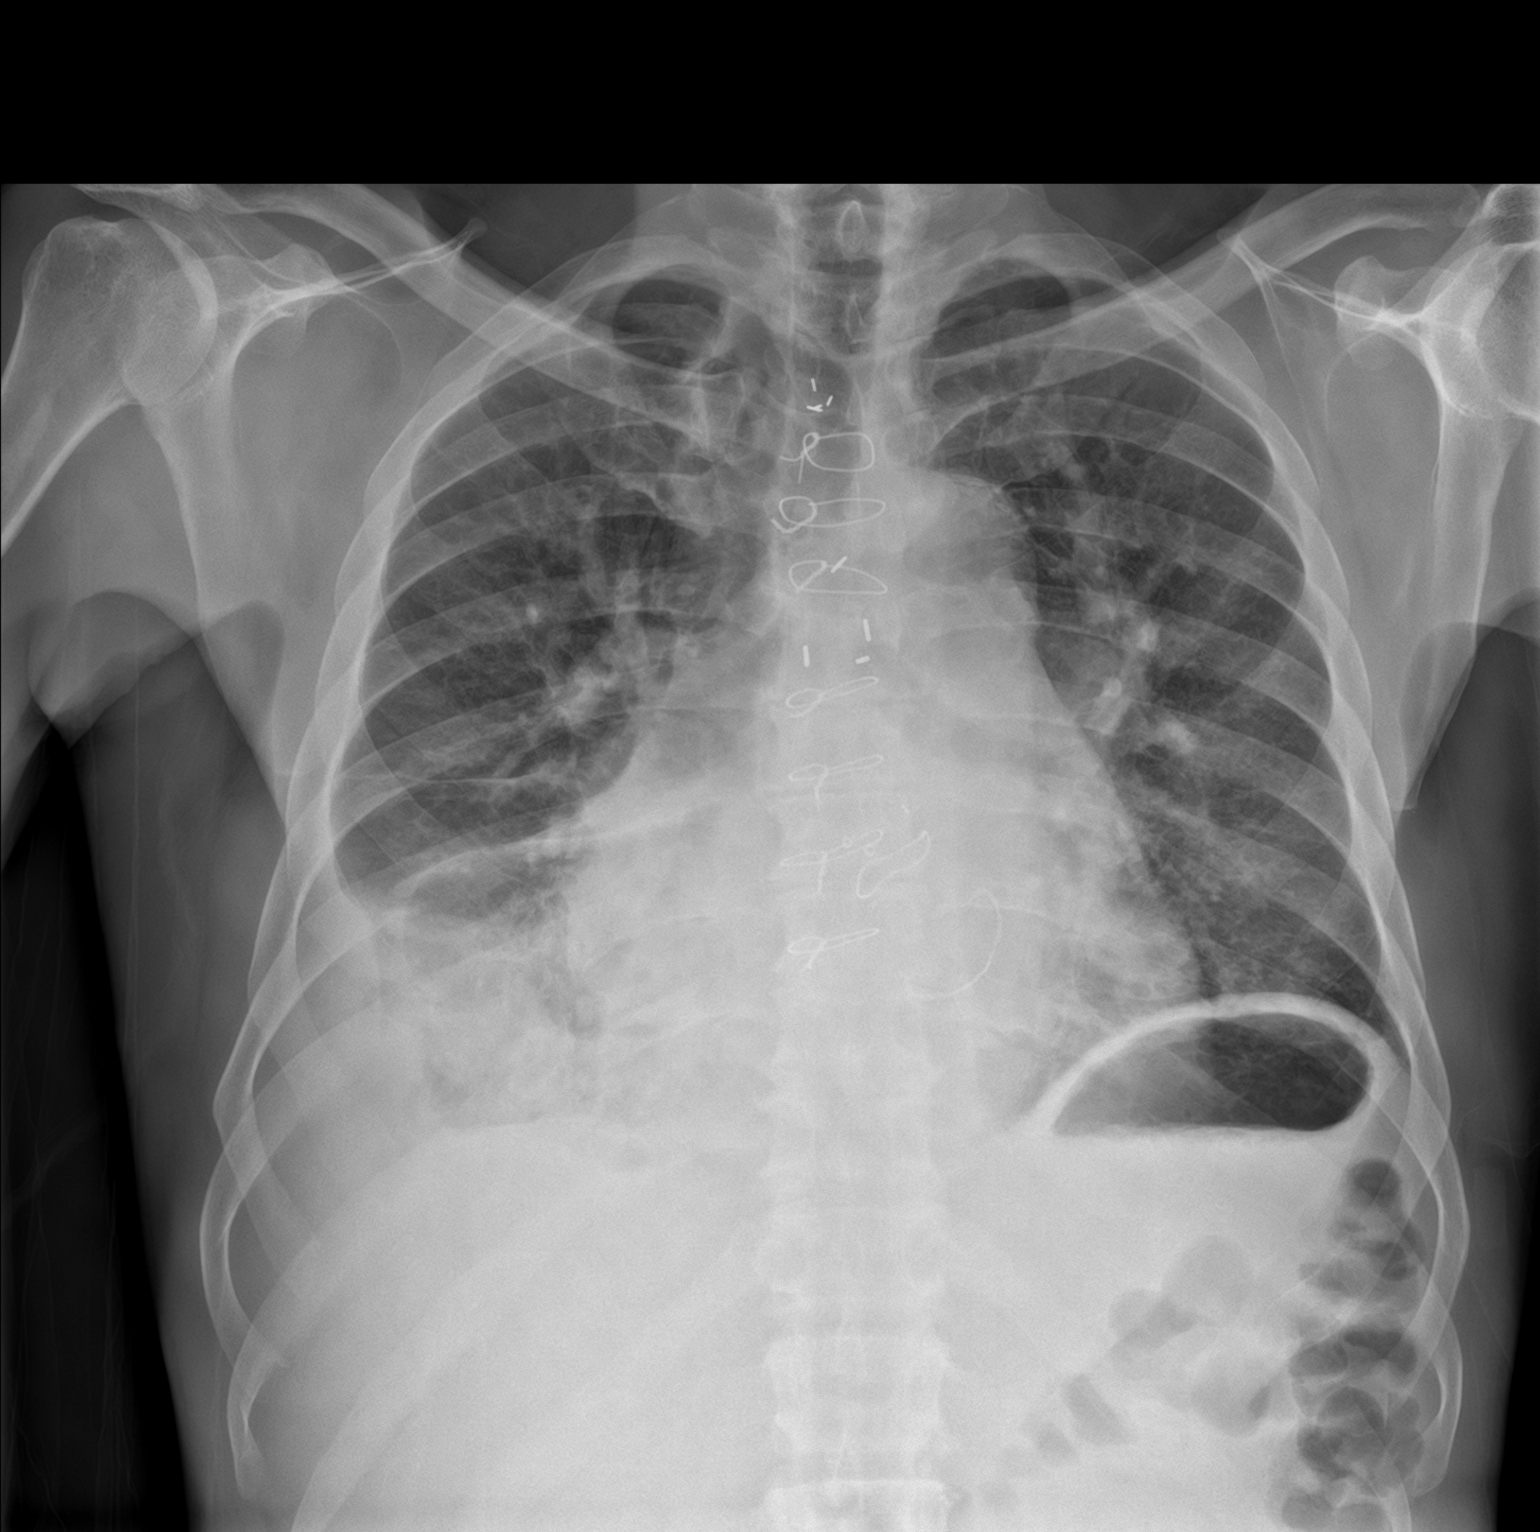

[chest lat]
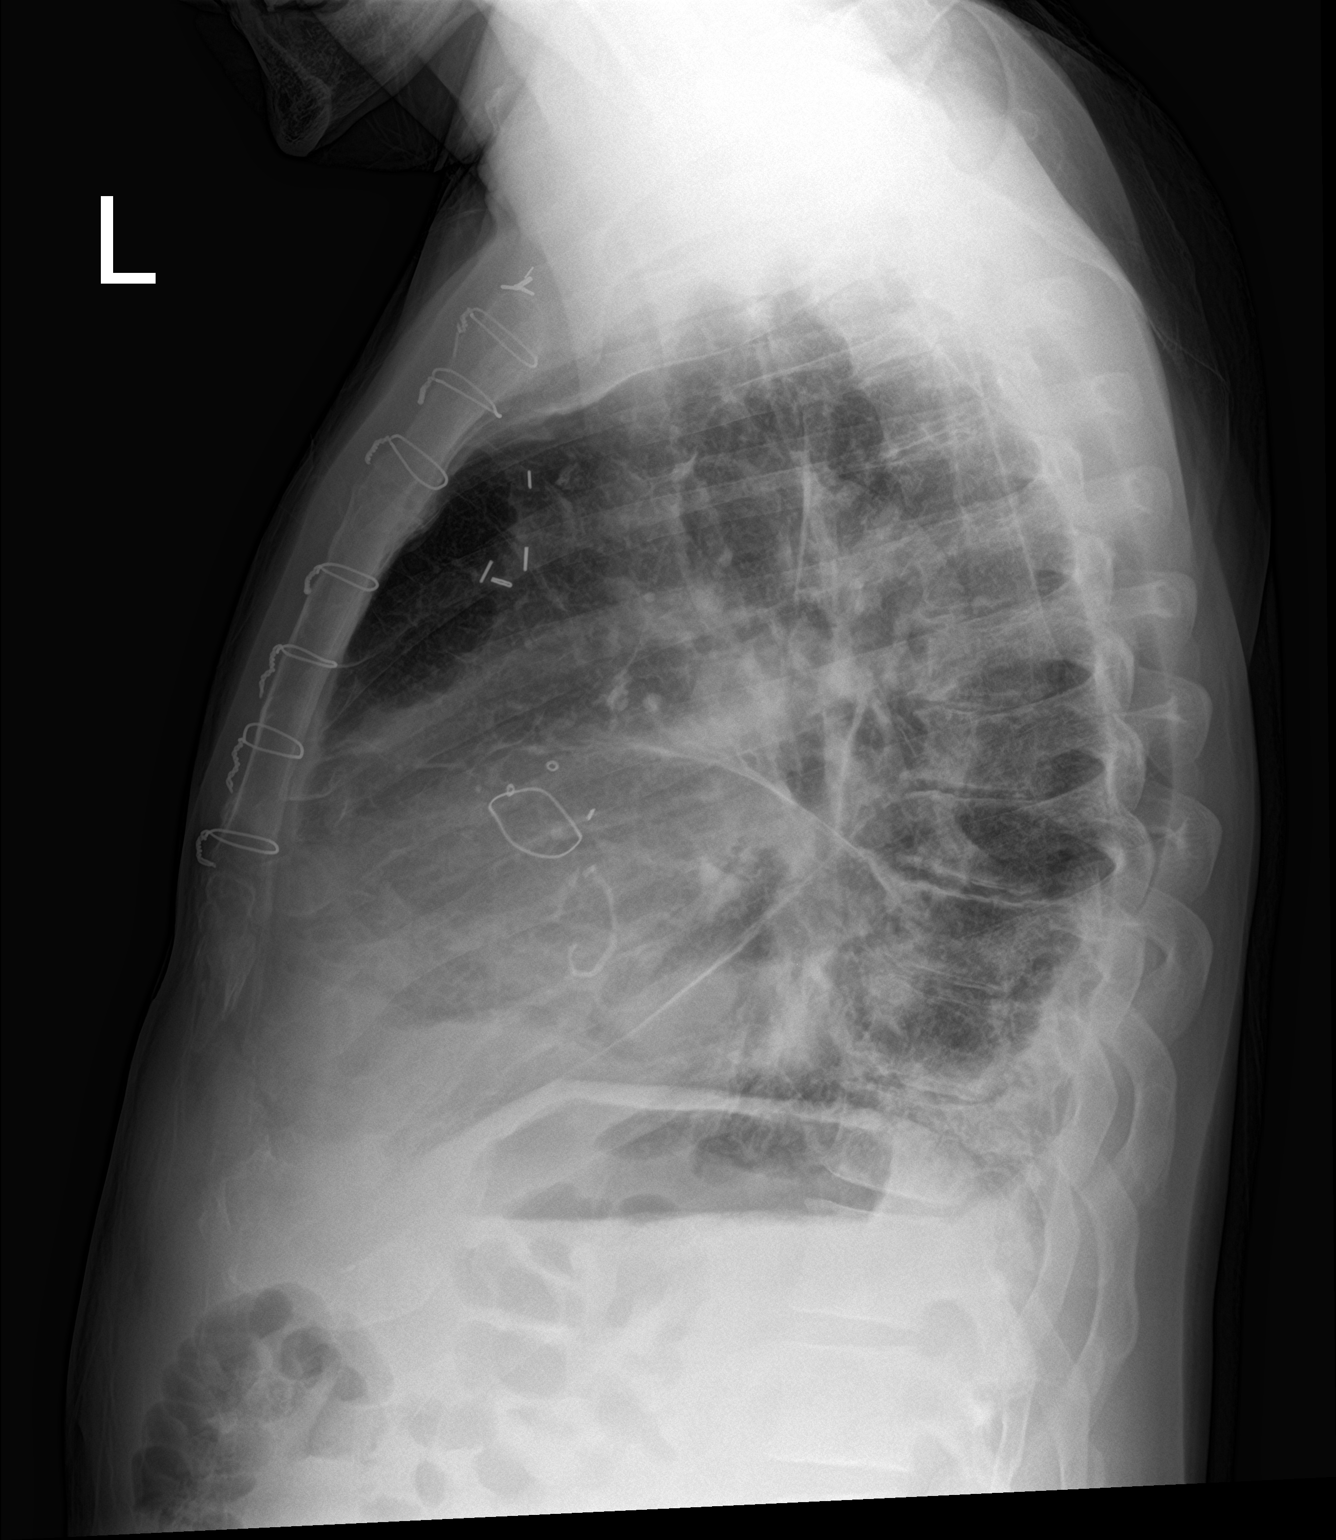

[2 of 2 positions shown; findings below may reference images not displayed]

FINDINGS: Intact sternotomy wires. Mitral annuloplasty ring in place. Stable
cardiomediastinal silhouette with mild cardiomegaly. No
pneumothorax. Small right pleural effusion is increased. No left
pleural effusion. Mild pulmonary edema. Patchy right greater than
left bibasilar lung opacities worsened on the right.
IMPRESSION: 1. Mild congestive heart failure with increased small right pleural
effusion.
2. Patchy bibasilar lung opacities, right greater than left,
worsened on the right, favor atelectasis, cannot exclude a component
of aspiration or pneumonia. Chest radiograph follow-up advised.

## 2020-05-19 IMAGING — US US ABDOMEN LIMITED
1 series · 14 of 25 positions shown · non-contrast
Comparison: None.

CLINICAL DATA: Cirrhosis and abdominal distension

EXAM:
ULTRASOUND ABDOMEN LIMITED RIGHT UPPER QUADRANT

[Series 1: us abdomen limited · 14 of 62 slices shown]
[im 1/62]
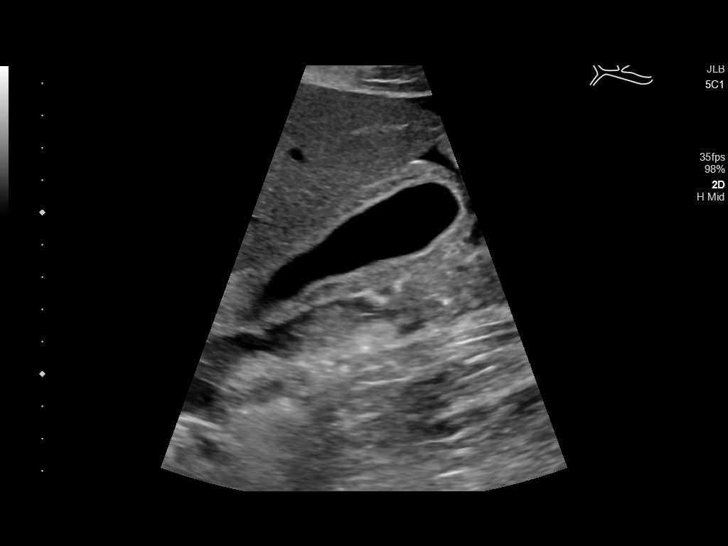
[im 6/62]
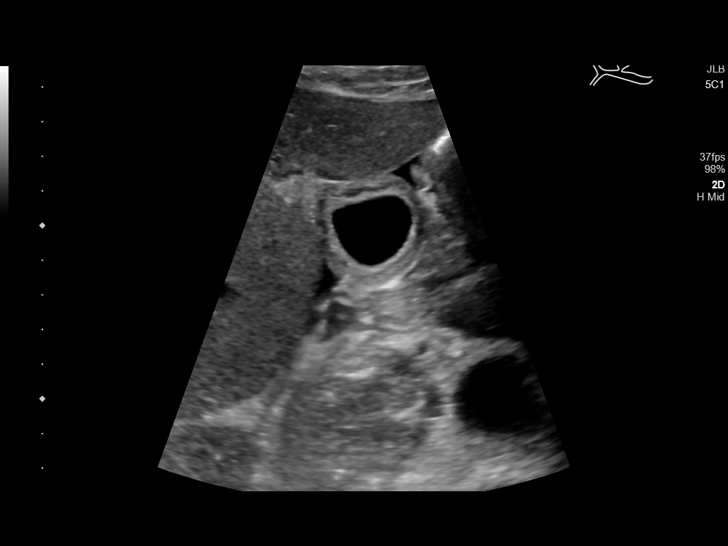
[im 11/62]
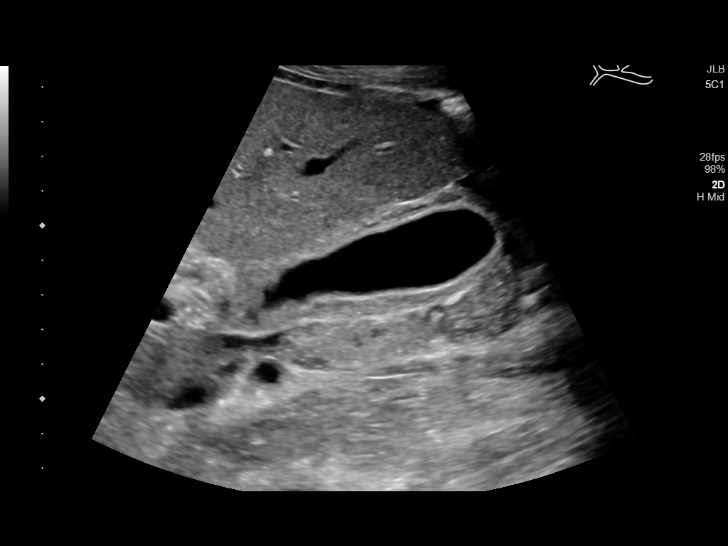
[im 16/62]
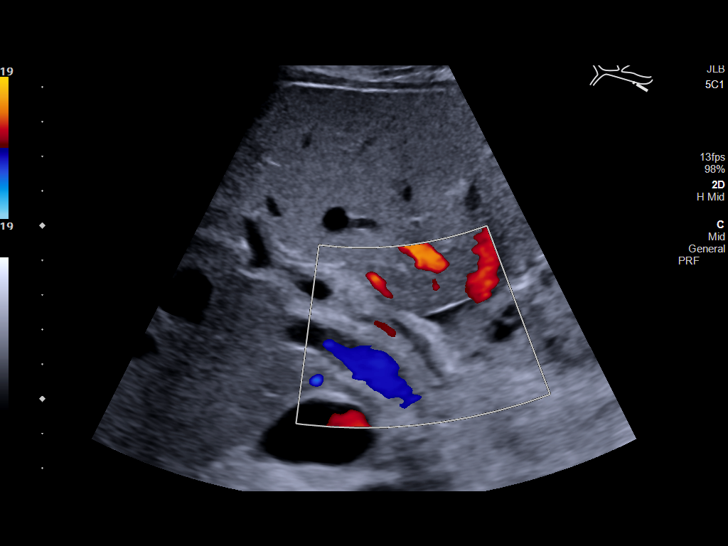
[im 21/62]
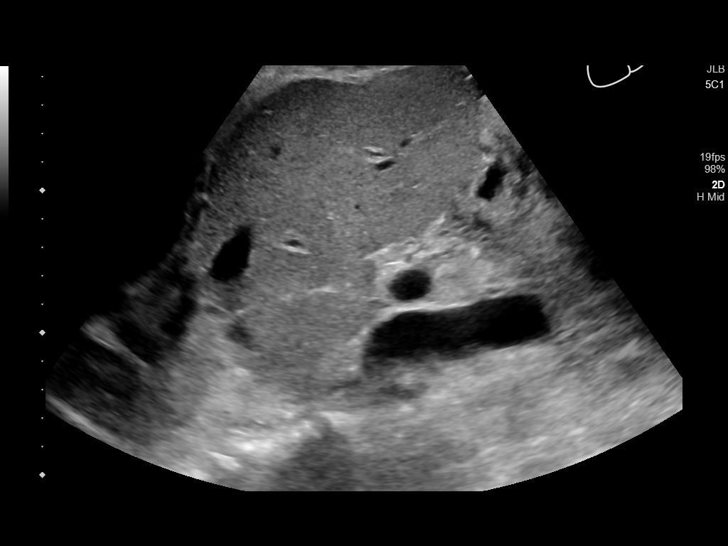
[im 23/62]
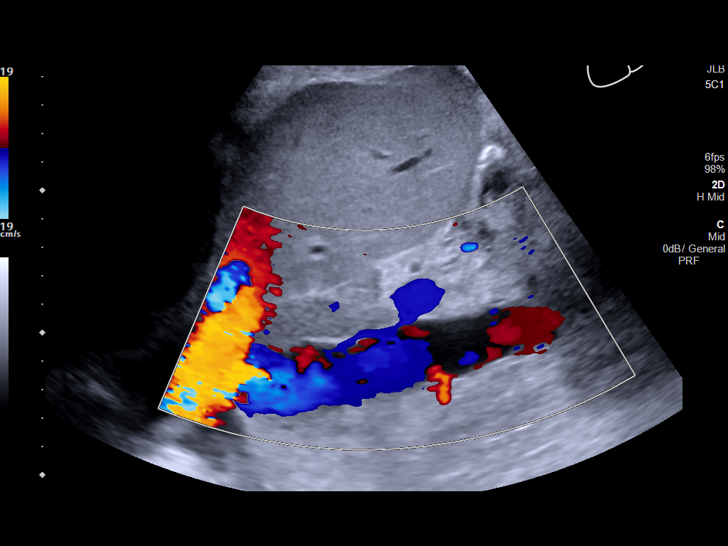
[im 28/62]
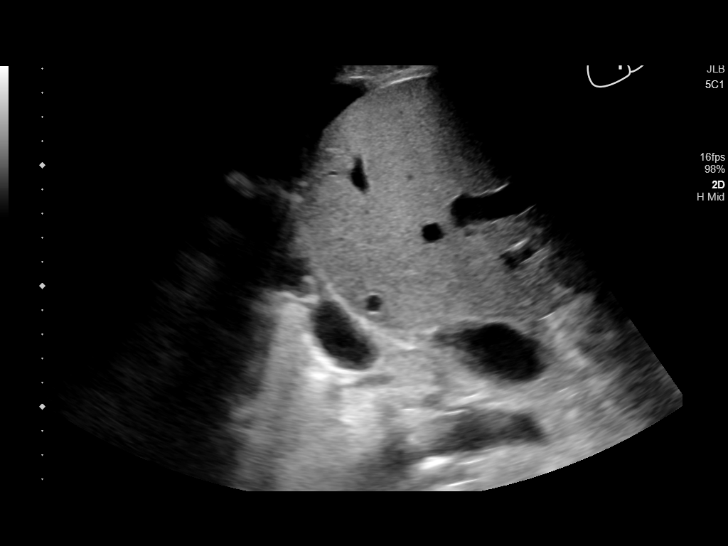
[im 34/62]
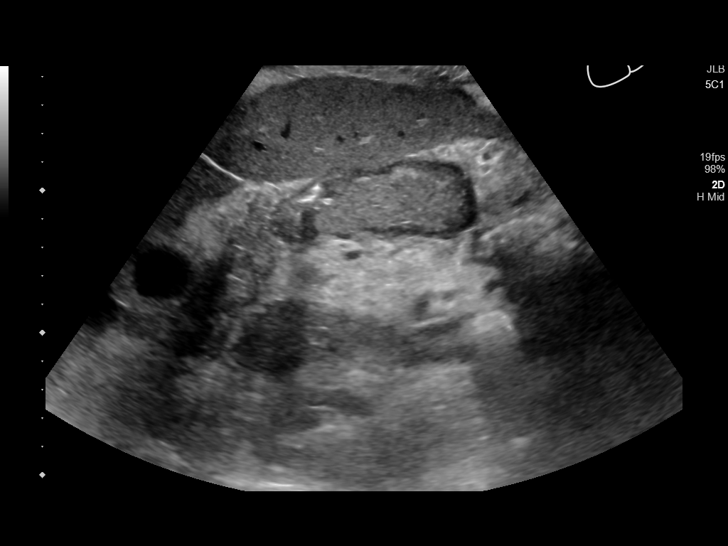
[im 39/62]
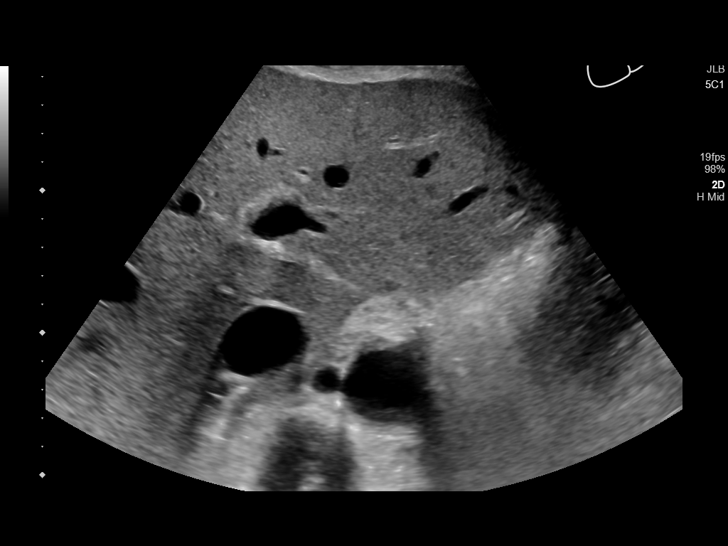
[im 41/62]
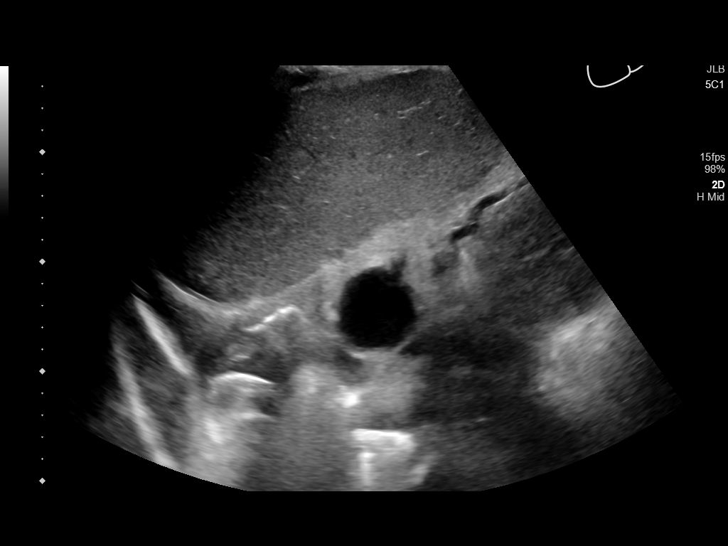
[im 46/62]
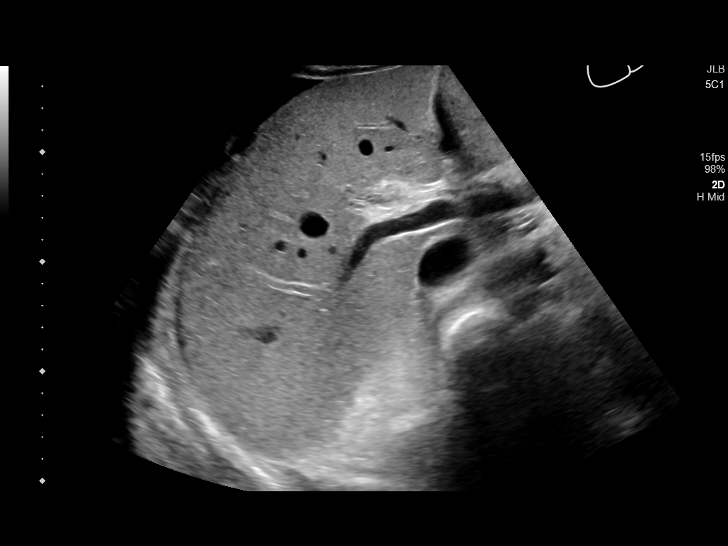
[im 51/62]
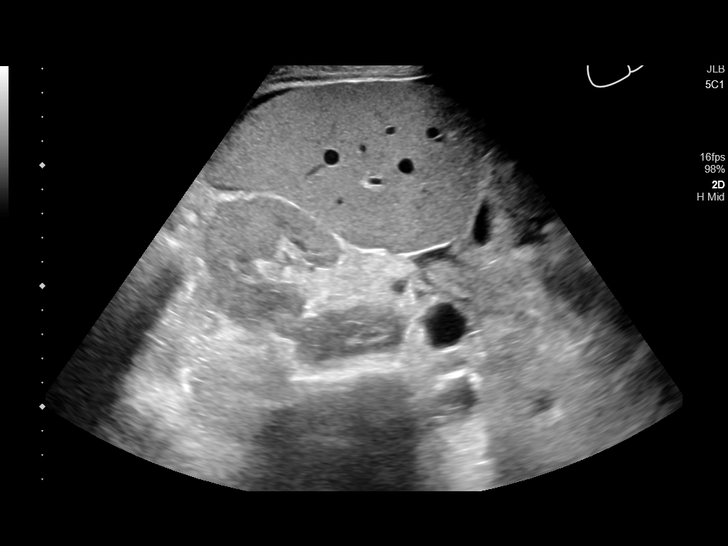
[im 56/62]
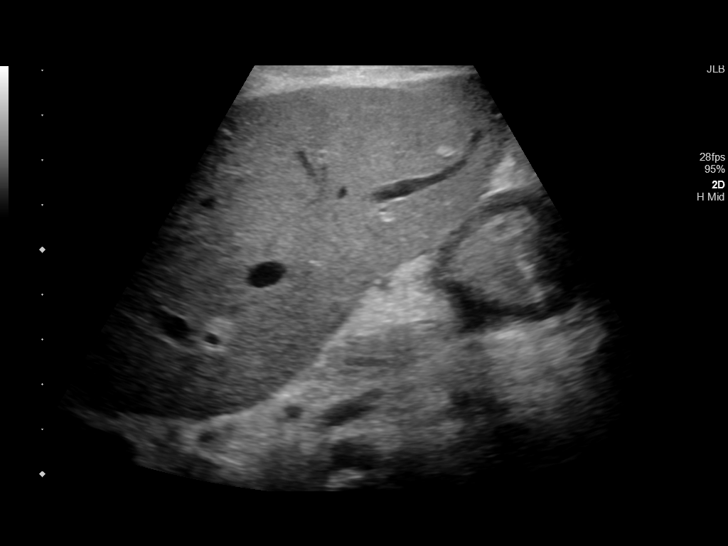
[im 62/62]
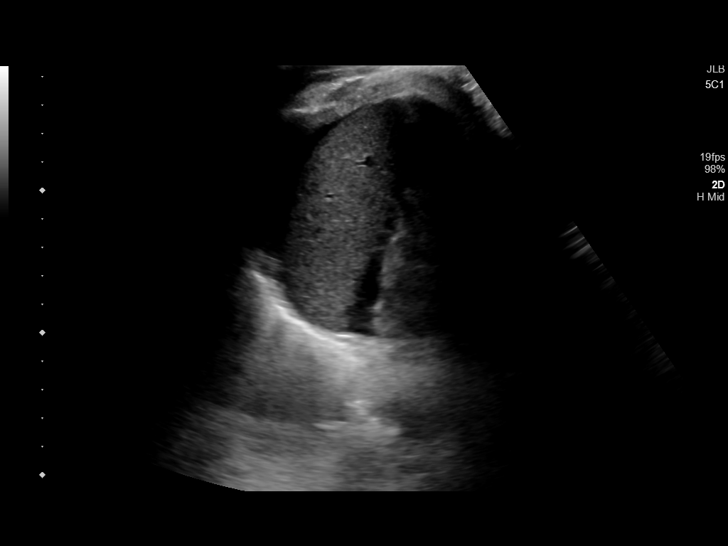

[14 of 25 positions shown; findings below may reference images not displayed]

FINDINGS: Gallbladder:

No gallstones visualized. The gallbladder wall is diffusely
thickened measuring up to 0.7 cm. No sonographic Murphy sign noted
by sonographer.

Common bile duct:

Diameter: 0.4 cm

Liver:

No focal lesion identified. The liver contour is mildly nodular.
Within normal limits in parenchymal echogenicity. Portal vein is
patent on color Doppler imaging with normal direction of blood flow
towards the liver.

Other: Mild right upper quadrant ascites. Small right pleural
effusion.
IMPRESSION: 1. Mildly nodular contour of the liver consistent with history of
cirrhosis. Mild right upper quadrant ascites and small pleural
effusion visualized.

2. Gallbladder wall thickening which is a nonspecific finding and
can be seen in the setting of gallbladder inflammation, cirrhosis,
fluid overload states, hypoalbuminemia, among others. No gallstones
visualized.
